# Patient Record
Sex: Female | Born: 1935 | Race: White | Hispanic: No | Smoking: Never smoker
Health system: Southern US, Community
[De-identification: ages and names within clinical notes are randomized; demographics above are authoritative.]

## PROBLEM LIST (undated history)

## (undated) DIAGNOSIS — R55 Syncope and collapse: Secondary | ICD-10-CM

## (undated) DIAGNOSIS — B029 Zoster without complications: Secondary | ICD-10-CM

## (undated) DIAGNOSIS — Z8719 Personal history of other diseases of the digestive system: Secondary | ICD-10-CM

## (undated) DIAGNOSIS — R413 Other amnesia: Secondary | ICD-10-CM

## (undated) DIAGNOSIS — I509 Heart failure, unspecified: Secondary | ICD-10-CM

## (undated) DIAGNOSIS — E039 Hypothyroidism, unspecified: Secondary | ICD-10-CM

## (undated) DIAGNOSIS — I480 Paroxysmal atrial fibrillation: Secondary | ICD-10-CM

## (undated) DIAGNOSIS — S7010XA Contusion of unspecified thigh, initial encounter: Secondary | ICD-10-CM

## (undated) DIAGNOSIS — G250 Essential tremor: Secondary | ICD-10-CM

## (undated) DIAGNOSIS — E785 Hyperlipidemia, unspecified: Secondary | ICD-10-CM

## (undated) DIAGNOSIS — I2510 Atherosclerotic heart disease of native coronary artery without angina pectoris: Secondary | ICD-10-CM

## (undated) DIAGNOSIS — H353190 Nonexudative age-related macular degeneration, unspecified eye, stage unspecified: Secondary | ICD-10-CM

## (undated) DIAGNOSIS — Z9889 Other specified postprocedural states: Secondary | ICD-10-CM

## (undated) DIAGNOSIS — N390 Urinary tract infection, site not specified: Secondary | ICD-10-CM

## (undated) DIAGNOSIS — R42 Dizziness and giddiness: Secondary | ICD-10-CM

## (undated) DIAGNOSIS — K219 Gastro-esophageal reflux disease without esophagitis: Secondary | ICD-10-CM

## (undated) DIAGNOSIS — I442 Atrioventricular block, complete: Secondary | ICD-10-CM

## (undated) DIAGNOSIS — G709 Myoneural disorder, unspecified: Secondary | ICD-10-CM

## (undated) DIAGNOSIS — I10 Essential (primary) hypertension: Secondary | ICD-10-CM

## (undated) DIAGNOSIS — J189 Pneumonia, unspecified organism: Secondary | ICD-10-CM

## (undated) DIAGNOSIS — Z950 Presence of cardiac pacemaker: Secondary | ICD-10-CM

## (undated) DIAGNOSIS — I951 Orthostatic hypotension: Secondary | ICD-10-CM

## (undated) DIAGNOSIS — K859 Acute pancreatitis without necrosis or infection, unspecified: Secondary | ICD-10-CM

## (undated) DIAGNOSIS — Z8679 Personal history of other diseases of the circulatory system: Secondary | ICD-10-CM

## (undated) HISTORY — PX: REPAIR SPIGELIAN HERNIA: SUR1208

## (undated) HISTORY — PX: OTHER SURGICAL HISTORY: SHX169

## (undated) HISTORY — DX: Gastro-esophageal reflux disease without esophagitis: K21.9

## (undated) HISTORY — PX: INSERT / REPLACE / REMOVE PACEMAKER: SUR710

## (undated) HISTORY — PX: HERNIA REPAIR: SHX51

## (undated) HISTORY — PX: ESOPHAGEAL DILATION: SHX303

## (undated) HISTORY — DX: Syncope and collapse: R55

## (undated) HISTORY — DX: Acute pancreatitis without necrosis or infection, unspecified: K85.90

## (undated) HISTORY — DX: Zoster without complications: B02.9

## (undated) HISTORY — DX: Hypothyroidism, unspecified: E03.9

## (undated) HISTORY — DX: Atherosclerotic heart disease of native coronary artery without angina pectoris: I25.10

## (undated) HISTORY — PX: CATARACT EXTRACTION W/ INTRAOCULAR LENS  IMPLANT, BILATERAL: SHX1307

---
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
## 1968-12-01 HISTORY — PX: VAGINAL HYSTERECTOMY: SUR661

## 1968-12-01 HISTORY — PX: LEFT OOPHORECTOMY: SHX1961

## 1978-12-02 DIAGNOSIS — B029 Zoster without complications: Secondary | ICD-10-CM

## 1978-12-02 HISTORY — DX: Zoster without complications: B02.9

## 1997-08-19 ENCOUNTER — Encounter: Admission: RE | Admit: 1997-08-19 | Discharge: 1997-11-17 | Payer: Self-pay | Admitting: Orthopedic Surgery

## 1999-03-14 ENCOUNTER — Encounter: Payer: Self-pay | Admitting: Emergency Medicine

## 1999-03-14 ENCOUNTER — Inpatient Hospital Stay (HOSPITAL_COMMUNITY): Admission: EM | Admit: 1999-03-14 | Discharge: 1999-03-18 | Payer: Self-pay | Admitting: Emergency Medicine

## 1999-03-17 ENCOUNTER — Encounter: Payer: Self-pay | Admitting: Cardiovascular Disease

## 1999-04-13 ENCOUNTER — Encounter: Payer: Self-pay | Admitting: *Deleted

## 1999-04-13 ENCOUNTER — Inpatient Hospital Stay (HOSPITAL_COMMUNITY): Admission: AD | Admit: 1999-04-13 | Discharge: 1999-04-15 | Payer: Self-pay | Admitting: *Deleted

## 2000-09-24 ENCOUNTER — Inpatient Hospital Stay (HOSPITAL_COMMUNITY): Admission: EM | Admit: 2000-09-24 | Discharge: 2000-09-25 | Payer: Self-pay | Admitting: Emergency Medicine

## 2000-09-24 ENCOUNTER — Encounter: Payer: Self-pay | Admitting: Emergency Medicine

## 2000-10-11 ENCOUNTER — Inpatient Hospital Stay (HOSPITAL_COMMUNITY): Admission: EM | Admit: 2000-10-11 | Discharge: 2000-10-17 | Payer: Self-pay | Admitting: Emergency Medicine

## 2000-10-11 ENCOUNTER — Encounter: Payer: Self-pay | Admitting: Emergency Medicine

## 2000-10-13 ENCOUNTER — Encounter: Payer: Self-pay | Admitting: Cardiovascular Disease

## 2000-10-15 ENCOUNTER — Encounter: Payer: Self-pay | Admitting: Cardiovascular Disease

## 2001-01-23 ENCOUNTER — Encounter: Payer: Self-pay | Admitting: Emergency Medicine

## 2001-01-23 ENCOUNTER — Encounter: Payer: Self-pay | Admitting: Cardiology

## 2001-01-23 ENCOUNTER — Inpatient Hospital Stay (HOSPITAL_COMMUNITY): Admission: EM | Admit: 2001-01-23 | Discharge: 2001-01-24 | Payer: Self-pay | Admitting: Emergency Medicine

## 2001-03-19 ENCOUNTER — Ambulatory Visit (HOSPITAL_COMMUNITY): Admission: RE | Admit: 2001-03-19 | Discharge: 2001-03-19 | Payer: Self-pay | Admitting: Gastroenterology

## 2002-06-18 ENCOUNTER — Inpatient Hospital Stay (HOSPITAL_COMMUNITY): Admission: EM | Admit: 2002-06-18 | Discharge: 2002-06-20 | Payer: Self-pay | Admitting: Emergency Medicine

## 2002-06-18 ENCOUNTER — Encounter: Payer: Self-pay | Admitting: Emergency Medicine

## 2002-08-19 ENCOUNTER — Emergency Department (HOSPITAL_COMMUNITY): Admission: EM | Admit: 2002-08-19 | Discharge: 2002-08-19 | Payer: Self-pay

## 2002-11-26 ENCOUNTER — Encounter: Payer: Self-pay | Admitting: Emergency Medicine

## 2002-11-26 ENCOUNTER — Observation Stay (HOSPITAL_COMMUNITY): Admission: EM | Admit: 2002-11-26 | Discharge: 2002-11-27 | Payer: Self-pay | Admitting: Emergency Medicine

## 2003-04-10 ENCOUNTER — Inpatient Hospital Stay (HOSPITAL_COMMUNITY): Admission: EM | Admit: 2003-04-10 | Discharge: 2003-04-10 | Payer: Self-pay | Admitting: Emergency Medicine

## 2003-10-01 ENCOUNTER — Encounter (INDEPENDENT_AMBULATORY_CARE_PROVIDER_SITE_OTHER): Payer: Self-pay | Admitting: Specialist

## 2003-10-01 ENCOUNTER — Inpatient Hospital Stay (HOSPITAL_COMMUNITY): Admission: RE | Admit: 2003-10-01 | Discharge: 2003-10-01 | Payer: Self-pay | Admitting: Plastic Surgery

## 2003-12-24 ENCOUNTER — Inpatient Hospital Stay (HOSPITAL_COMMUNITY): Admission: EM | Admit: 2003-12-24 | Discharge: 2003-12-25 | Payer: Self-pay | Admitting: Emergency Medicine

## 2004-02-01 ENCOUNTER — Ambulatory Visit: Payer: Self-pay | Admitting: Family Medicine

## 2004-02-01 ENCOUNTER — Ambulatory Visit (HOSPITAL_COMMUNITY): Admission: RE | Admit: 2004-02-01 | Discharge: 2004-02-01 | Payer: Self-pay | Admitting: Family Medicine

## 2004-02-17 ENCOUNTER — Ambulatory Visit: Payer: Self-pay | Admitting: Family Medicine

## 2004-04-02 ENCOUNTER — Emergency Department (HOSPITAL_COMMUNITY): Admission: EM | Admit: 2004-04-02 | Discharge: 2004-04-02 | Payer: Self-pay | Admitting: Emergency Medicine

## 2004-04-26 ENCOUNTER — Ambulatory Visit: Payer: Self-pay | Admitting: Family Medicine

## 2004-04-27 ENCOUNTER — Ambulatory Visit: Payer: Self-pay | Admitting: Family Medicine

## 2004-05-02 ENCOUNTER — Ambulatory Visit: Payer: Self-pay | Admitting: Family Medicine

## 2004-05-30 ENCOUNTER — Ambulatory Visit: Payer: Self-pay | Admitting: Family Medicine

## 2004-06-27 ENCOUNTER — Ambulatory Visit: Payer: Self-pay | Admitting: Family Medicine

## 2004-09-09 ENCOUNTER — Emergency Department (HOSPITAL_COMMUNITY): Admission: EM | Admit: 2004-09-09 | Discharge: 2004-09-09 | Payer: Self-pay | Admitting: Emergency Medicine

## 2005-01-05 ENCOUNTER — Ambulatory Visit: Payer: Self-pay | Admitting: Family Medicine

## 2005-01-17 ENCOUNTER — Ambulatory Visit: Payer: Self-pay | Admitting: Family Medicine

## 2005-12-07 ENCOUNTER — Ambulatory Visit: Payer: Self-pay | Admitting: Family Medicine

## 2005-12-23 ENCOUNTER — Inpatient Hospital Stay (HOSPITAL_COMMUNITY): Admission: EM | Admit: 2005-12-23 | Discharge: 2005-12-27 | Payer: Self-pay | Admitting: Emergency Medicine

## 2005-12-24 HISTORY — PX: CARDIAC CATHETERIZATION: SHX172

## 2005-12-28 ENCOUNTER — Inpatient Hospital Stay (HOSPITAL_COMMUNITY): Admission: EM | Admit: 2005-12-28 | Discharge: 2005-12-31 | Payer: Self-pay | Admitting: Emergency Medicine

## 2006-01-02 ENCOUNTER — Ambulatory Visit: Payer: Self-pay | Admitting: Internal Medicine

## 2006-01-15 ENCOUNTER — Ambulatory Visit: Payer: Self-pay | Admitting: Family Medicine

## 2006-02-06 ENCOUNTER — Ambulatory Visit: Payer: Self-pay | Admitting: Family Medicine

## 2006-05-23 ENCOUNTER — Ambulatory Visit: Payer: Self-pay | Admitting: Family Medicine

## 2006-10-11 ENCOUNTER — Inpatient Hospital Stay (HOSPITAL_COMMUNITY): Admission: EM | Admit: 2006-10-11 | Discharge: 2006-10-15 | Payer: Self-pay | Admitting: Emergency Medicine

## 2006-10-13 HISTORY — PX: CARDIAC CATHETERIZATION: SHX172

## 2006-10-15 ENCOUNTER — Telehealth: Payer: Self-pay | Admitting: Family Medicine

## 2006-10-15 ENCOUNTER — Telehealth (INDEPENDENT_AMBULATORY_CARE_PROVIDER_SITE_OTHER): Payer: Self-pay | Admitting: *Deleted

## 2006-11-09 DIAGNOSIS — R110 Nausea: Secondary | ICD-10-CM

## 2006-11-12 ENCOUNTER — Ambulatory Visit: Payer: Self-pay | Admitting: Family Medicine

## 2006-11-12 DIAGNOSIS — Z8719 Personal history of other diseases of the digestive system: Secondary | ICD-10-CM | POA: Insufficient documentation

## 2006-11-12 DIAGNOSIS — I10 Essential (primary) hypertension: Secondary | ICD-10-CM

## 2006-11-12 DIAGNOSIS — K219 Gastro-esophageal reflux disease without esophagitis: Secondary | ICD-10-CM

## 2006-11-12 DIAGNOSIS — E039 Hypothyroidism, unspecified: Secondary | ICD-10-CM

## 2006-11-14 ENCOUNTER — Inpatient Hospital Stay (HOSPITAL_COMMUNITY): Admission: EM | Admit: 2006-11-14 | Discharge: 2006-11-15 | Payer: Self-pay | Admitting: Emergency Medicine

## 2006-11-14 HISTORY — PX: CARDIAC CATHETERIZATION: SHX172

## 2006-11-15 ENCOUNTER — Inpatient Hospital Stay (HOSPITAL_COMMUNITY): Admission: EM | Admit: 2006-11-15 | Discharge: 2006-11-20 | Payer: Self-pay | Admitting: Emergency Medicine

## 2006-11-15 ENCOUNTER — Ambulatory Visit: Payer: Self-pay | Admitting: Internal Medicine

## 2006-11-18 ENCOUNTER — Ambulatory Visit: Payer: Self-pay | Admitting: Vascular Surgery

## 2006-11-28 ENCOUNTER — Ambulatory Visit: Payer: Self-pay | Admitting: Family Medicine

## 2006-11-28 DIAGNOSIS — I2510 Atherosclerotic heart disease of native coronary artery without angina pectoris: Secondary | ICD-10-CM

## 2006-11-28 DIAGNOSIS — R55 Syncope and collapse: Secondary | ICD-10-CM

## 2006-12-07 ENCOUNTER — Emergency Department (HOSPITAL_COMMUNITY): Admission: EM | Admit: 2006-12-07 | Discharge: 2006-12-07 | Payer: Self-pay | Admitting: Emergency Medicine

## 2006-12-09 ENCOUNTER — Ambulatory Visit (HOSPITAL_COMMUNITY): Admission: RE | Admit: 2006-12-09 | Discharge: 2006-12-09 | Payer: Self-pay | Admitting: Cardiovascular Disease

## 2007-02-01 ENCOUNTER — Emergency Department (HOSPITAL_COMMUNITY): Admission: EM | Admit: 2007-02-01 | Discharge: 2007-02-01 | Payer: Self-pay | Admitting: Emergency Medicine

## 2007-02-06 ENCOUNTER — Ambulatory Visit: Payer: Self-pay | Admitting: Family Medicine

## 2007-04-25 ENCOUNTER — Ambulatory Visit: Payer: Self-pay | Admitting: Family Medicine

## 2007-04-25 DIAGNOSIS — L259 Unspecified contact dermatitis, unspecified cause: Secondary | ICD-10-CM

## 2007-06-11 ENCOUNTER — Encounter: Payer: Self-pay | Admitting: Family Medicine

## 2007-07-21 ENCOUNTER — Telehealth: Payer: Self-pay | Admitting: Family Medicine

## 2007-07-23 ENCOUNTER — Inpatient Hospital Stay (HOSPITAL_COMMUNITY): Admission: EM | Admit: 2007-07-23 | Discharge: 2007-07-25 | Payer: Self-pay | Admitting: Emergency Medicine

## 2007-07-24 HISTORY — PX: CARDIAC CATHETERIZATION: SHX172

## 2007-07-29 ENCOUNTER — Ambulatory Visit: Payer: Self-pay | Admitting: Family Medicine

## 2007-07-29 DIAGNOSIS — F329 Major depressive disorder, single episode, unspecified: Secondary | ICD-10-CM

## 2007-07-29 DIAGNOSIS — F0390 Unspecified dementia without behavioral disturbance: Secondary | ICD-10-CM

## 2008-01-14 ENCOUNTER — Encounter: Payer: Self-pay | Admitting: Family Medicine

## 2008-01-19 ENCOUNTER — Encounter: Payer: Self-pay | Admitting: Family Medicine

## 2008-01-23 ENCOUNTER — Encounter: Admission: RE | Admit: 2008-01-23 | Discharge: 2008-01-23 | Payer: Self-pay | Admitting: Cardiovascular Disease

## 2008-01-28 ENCOUNTER — Inpatient Hospital Stay (HOSPITAL_COMMUNITY): Admission: RE | Admit: 2008-01-28 | Discharge: 2008-01-29 | Payer: Self-pay | Admitting: Cardiology

## 2008-03-12 ENCOUNTER — Inpatient Hospital Stay (HOSPITAL_COMMUNITY): Admission: EM | Admit: 2008-03-12 | Discharge: 2008-03-13 | Payer: Self-pay | Admitting: Emergency Medicine

## 2009-03-18 ENCOUNTER — Encounter (INDEPENDENT_AMBULATORY_CARE_PROVIDER_SITE_OTHER): Payer: Self-pay | Admitting: *Deleted

## 2009-04-04 LAB — HM MAMMOGRAPHY

## 2009-04-11 ENCOUNTER — Encounter: Payer: Self-pay | Admitting: Family Medicine

## 2009-10-10 ENCOUNTER — Ambulatory Visit: Payer: Self-pay | Admitting: Family Medicine

## 2009-10-11 ENCOUNTER — Telehealth: Payer: Self-pay | Admitting: Family Medicine

## 2009-10-12 ENCOUNTER — Encounter: Payer: Self-pay | Admitting: Family Medicine

## 2009-10-12 LAB — CONVERTED CEMR LAB
ALT: 30 units/L (ref 0–35)
AST: 35 units/L (ref 0–37)
Alkaline Phosphatase: 231 units/L — ABNORMAL HIGH (ref 39–117)
BUN: 18 mg/dL (ref 6–23)
Eosinophils Relative: 4.5 % (ref 0.0–5.0)
GFR calc non Af Amer: 81.38 mL/min (ref >60)
HCT: 37.5 % (ref 36.0–46.0)
Hemoglobin: 12.7 g/dL (ref 12.0–15.0)
LDL Cholesterol: 71 mg/dL (ref 0–99)
Lymphocytes Relative: 22.2 % (ref 12.0–46.0)
Lymphs Abs: 1.6 10*3/uL (ref 0.7–4.0)
Monocytes Relative: 11.7 % (ref 3.0–12.0)
Platelets: 175 10*3/uL (ref 150.0–400.0)
Potassium: 4.9 meq/L (ref 3.5–5.1)
Sodium: 142 meq/L (ref 135–145)
TSH: 1.83 microintl units/mL (ref 0.35–5.50)
Total Bilirubin: 0.9 mg/dL (ref 0.3–1.2)
Total CHOL/HDL Ratio: 3
VLDL: 24.4 mg/dL (ref 0.0–40.0)
WBC: 7.4 10*3/uL (ref 4.5–10.5)

## 2010-01-13 ENCOUNTER — Inpatient Hospital Stay (HOSPITAL_COMMUNITY): Admission: EM | Admit: 2010-01-13 | Discharge: 2010-01-17 | Payer: Self-pay | Admitting: Emergency Medicine

## 2010-01-16 HISTORY — PX: CARDIAC CATHETERIZATION: SHX172

## 2010-04-21 ENCOUNTER — Inpatient Hospital Stay (HOSPITAL_COMMUNITY)
Admission: EM | Admit: 2010-04-21 | Discharge: 2010-04-23 | Payer: Self-pay | Source: Home / Self Care | Attending: Cardiology | Admitting: Cardiology

## 2010-04-24 LAB — CBC
HCT: 36.2 % (ref 36.0–46.0)
Hemoglobin: 11.6 g/dL — ABNORMAL LOW (ref 12.0–15.0)
MCH: 28 pg (ref 26.0–34.0)
MCHC: 32 g/dL (ref 30.0–36.0)
MCV: 87.2 fL (ref 78.0–100.0)
RDW: 15.9 % — ABNORMAL HIGH (ref 11.5–15.5)

## 2010-04-24 LAB — DIFFERENTIAL
Basophils Absolute: 0 10*3/uL (ref 0.0–0.1)
Eosinophils Relative: 1 % (ref 0–5)
Lymphocytes Relative: 16 % (ref 12–46)
Monocytes Absolute: 1 10*3/uL (ref 0.1–1.0)
Monocytes Relative: 13 % — ABNORMAL HIGH (ref 3–12)
Neutro Abs: 5.2 10*3/uL (ref 1.7–7.7)

## 2010-04-24 LAB — CARDIAC PANEL(CRET KIN+CKTOT+MB+TROPI)
CK, MB: 3.7 ng/mL (ref 0.3–4.0)
Total CK: 110 U/L (ref 7–177)
Troponin I: 0.02 ng/mL (ref 0.00–0.06)

## 2010-04-24 LAB — POCT I-STAT, CHEM 8
Glucose, Bld: 113 mg/dL — ABNORMAL HIGH (ref 70–99)
HCT: 42 % (ref 36.0–46.0)
Hemoglobin: 14.3 g/dL (ref 12.0–15.0)
Potassium: 3 mEq/L — ABNORMAL LOW (ref 3.5–5.1)
Sodium: 142 mEq/L (ref 135–145)
TCO2: 29 mmol/L (ref 0–100)

## 2010-04-24 LAB — BRAIN NATRIURETIC PEPTIDE: Pro B Natriuretic peptide (BNP): 447 pg/mL — ABNORMAL HIGH (ref 0.0–100.0)

## 2010-04-24 LAB — POCT CARDIAC MARKERS: CKMB, poc: 4.6 ng/mL (ref 1.0–8.0)

## 2010-04-24 LAB — PROTIME-INR
INR: 0.97 (ref 0.00–1.49)
Prothrombin Time: 13.1 seconds (ref 11.6–15.2)

## 2010-04-24 LAB — HEPARIN LEVEL (UNFRACTIONATED): Heparin Unfractionated: 0.31 IU/mL (ref 0.30–0.70)

## 2010-04-24 LAB — CK TOTAL AND CKMB (NOT AT ARMC): CK, MB: 5.2 ng/mL — ABNORMAL HIGH (ref 0.3–4.0)

## 2010-04-25 LAB — LIPID PANEL
LDL Cholesterol: 69 mg/dL (ref 0–99)
VLDL: 13 mg/dL (ref 0–40)

## 2010-04-25 LAB — HEPARIN LEVEL (UNFRACTIONATED)
Heparin Unfractionated: 0.22 IU/mL — ABNORMAL LOW (ref 0.30–0.70)
Heparin Unfractionated: 0.49 IU/mL (ref 0.30–0.70)

## 2010-04-25 LAB — CARDIAC PANEL(CRET KIN+CKTOT+MB+TROPI)
CK, MB: 4 ng/mL (ref 0.3–4.0)
Relative Index: 3.1 — ABNORMAL HIGH (ref 0.0–2.5)
Troponin I: 0.04 ng/mL (ref 0.00–0.06)

## 2010-04-25 LAB — CBC
HCT: 35.9 % — ABNORMAL LOW (ref 36.0–46.0)
Hemoglobin: 11.4 g/dL — ABNORMAL LOW (ref 12.0–15.0)
MCH: 28.1 pg (ref 26.0–34.0)
MCHC: 31.8 g/dL (ref 30.0–36.0)
MCV: 88.4 fL (ref 78.0–100.0)
Platelets: 194 10*3/uL (ref 150–400)
RBC: 4.06 MIL/uL (ref 3.87–5.11)
RBC: 4.77 MIL/uL (ref 3.87–5.11)
WBC: 6.5 10*3/uL (ref 4.0–10.5)

## 2010-04-25 LAB — DIFFERENTIAL
Basophils Absolute: 0 10*3/uL (ref 0.0–0.1)
Basophils Relative: 0 % (ref 0–1)
Eosinophils Absolute: 0.4 10*3/uL (ref 0.0–0.7)
Monocytes Absolute: 0.9 10*3/uL (ref 0.1–1.0)
Monocytes Relative: 14 % — ABNORMAL HIGH (ref 3–12)

## 2010-04-25 LAB — COMPREHENSIVE METABOLIC PANEL
AST: 29 U/L (ref 0–37)
Albumin: 3.7 g/dL (ref 3.5–5.2)
Alkaline Phosphatase: 142 U/L — ABNORMAL HIGH (ref 39–117)
BUN: 11 mg/dL (ref 6–23)
CO2: 31 mEq/L (ref 19–32)
Chloride: 101 mEq/L (ref 96–112)
Creatinine, Ser: 0.94 mg/dL (ref 0.4–1.2)
GFR calc non Af Amer: 56 mL/min — ABNORMAL LOW (ref >60)
Potassium: 4.1 mEq/L (ref 3.5–5.1)
Total Bilirubin: 1.2 mg/dL (ref 0.3–1.2)

## 2010-04-25 LAB — BASIC METABOLIC PANEL
Chloride: 96 mEq/L (ref 96–112)
GFR calc Af Amer: >60 mL/min (ref >60)
Potassium: 3.3 mEq/L — ABNORMAL LOW (ref 3.5–5.1)

## 2010-04-25 LAB — BRAIN NATRIURETIC PEPTIDE
Pro B Natriuretic peptide (BNP): 323 pg/mL — ABNORMAL HIGH (ref 0.0–100.0)
Pro B Natriuretic peptide (BNP): 255 pg/mL — ABNORMAL HIGH (ref 0.0–100.0)

## 2010-04-27 NOTE — Discharge Summary (Addendum)
NAMEMarland Kitchen  [name] Luna, [name] Luna NO.:  [id number]  MEDICAL RECORD NO.:  [id number]          PATIENT TYPE:  INP  LOCATION:  2016                         FACILITY:  [facility]  PHYSICIAN:  Landry Corporal, MD DATE OF BIRTH:  1920/07/07  DATE OF ADMISSION:  04/21/2010 DATE OF DISCHARGE:  04/23/2010                              DISCHARGE SUMMARY   DISCHARGE DIAGNOSES: 1. Acute diastolic heart failure exacerbation. 2. Paroxysmal atrial fibrillation. 3. Coronary artery disease, status post percutaneous coronary     intervention, on Plavix. 4. Status post permanent pacemaker secondary to complete heart block. 5. Hypertension, treated, 6. Hypokalemia, repleted. 7. Chest pain- unstable angina.  HOSPITAL COURSE:  [name] Luna is an 77 year old Caucasian female with a history of permanent pacemaker implant which is [employer] secondary to complete heart block.  She also has a history of coronary artery disease, status post PCI and balloon cutting atherectomy to the LAD in October 2011 as well as 2008.  History also includes paroxysmal atrial fibrillation, hypertension, hypotension, complete heart block, and ejection fraction of 65% by cardiac cath.  Last carotid Dopplers were on January 09, 2008, with 0 to 49% right and left reduction.  She presented with chest pain, 8/10 and heavy, into the left breast.  She was admitted for rule out acute coronary syndrome.  Cardiac enzymes were negative x3. BNP was elevated at 447.  The patient was started on IV Lasix, subsequently diuresed and corresponding reduction in BNP.  At this time, the patient is stable and seen by Dr. Theodis Aguas, because she is ready to go home.  She will follow up Dr. Alanda Amass.  I will increase her Lasix to 40 mg twice a day.  DISCHARGE LABS:  WBC 6.5, hemoglobin 13.4, hematocrit 41.6, platelets 194.  Sodium 137, potassium 3.3, chloride 96, carbon dioxide 30, glucose 96, BUN 11, creatinine 0.87, calcium 9.4, total  cholesterol 129, triglycerides 64, HDL 47, LDL 69.  Thyroid stimulating hormone 3.556.  STUDIES/PROCEDURES:  Chest x-ray.  IMPRESSION:  Stable cardiomegaly.  Pulmonary venous hypertension without overt edema.  Bilateral pleural effusion with associated atelectasis versus pneumonia in the lower lobes.  This was completed on January 20.  DISCHARGE MEDICATIONS: 1. Furosemide 40 mg 1 tablet by mouth twice daily. 2. Potassium chloride 20 mEq 1 tablet by mouth daily. 3. Valsartan 80 mg 1 tablet by mouth twice daily. 4. Aspirin 81 mg 2 tablets by mouth every morning. 5. Calcium carbonate 750 mg 1 tablet by mouth twice daily. 6. Claritin 10 mg 1 tablet by mouth daily. 7. Plavix 75 mg 1 tablet by mouth daily with meal. 8. Hydrochlorothiazide 12.5 mg 1 capsule by mouth every morning. 9. Levothyroxine 75 mcg 1 tablet by mouth every morning. 10.Multivitamins 1 tablet by mouth every morning. 11.Metoprolol 100 mg 1-1/2 tablet by mouth twice daily. 12.Nitroglycerin 0.4 mg tablet sublingual 1 tablet take every 5     minutes for chest pain, maximum of 3 doses. 13.Ocuvite 1 tablet by mouth twice day. 14.Pantoprazole 40 mg enteric coated 2 tablets by mouth daily. 15.Polyethylene glycol 17 g by mouth every other day mixed in water. 16.Simvastatin 20 mg 1  tablet by mouth daily at bedtime. 17.Verapamil SR 120 mg 1 tablet by mouth daily.  FINAL DISPOSITION:  [name] Luna will be discharged home in stable condition, increase her activity slowly, eat low-sodium heart-healthy diet.  Follow up Dr. Alanda Amass this week and our office will call with the appointment.    ______________________________ Wilburt Finlay, PA   The patient was seen by one of my partners, Dr. Rennis Golden, on the day of discharge and was felt to be stable for discharge.  After reviewing her chart, I agree with Mr. Jasper Riling summary of the hospitalization.   ______________________________ Landry Corporal, MD    BH/MEDQ  D:  04/23/2010  T:   04/24/2010  Job:  [id number]  cc:   Gerlene Burdock A. Alanda Amass, M.D.  Electronically Signed by Wilburt Finlay PA on 04/25/2010 12:28:52 PM Electronically Signed by Bryan Lemma MD on 04/27/2010 11:31:38 PM

## 2010-05-04 NOTE — Miscellaneous (Signed)
Summary: mammogram update  Clinical Lists Changes  Observations: Added new observation of MAMMO DUE: 04/2010 (04/11/2009 14:09) Added new observation of MAMMOGRAM: normal (03/30/2009 14:09)      Preventive Care Screening  Mammogram:    Date:  03/30/2009    Next Due:  04/2010    Results:  normal

## 2010-05-04 NOTE — Progress Notes (Signed)
Summary: metoprolol question  Phone Note From Pharmacy   Caller: [facility]  [address] [phone]* Summary of Call: meotrolol succinate was sent to the pharmacy.  would you like that changed to succinate ER? Initial call taken by: Kern Reap CMA Duncan Dull),  October 11, 2009 5:06 PM  Follow-up for Phone Call        ok Follow-up by: Roderick Pee MD,  October 12, 2009 9:21 AM    New/Updated Medications: METOPROLOL SUCCINATE 100 MG XR24H-TAB (METOPROLOL SUCCINATE) take one tab by mouth two times a day Prescriptions: METOPROLOL SUCCINATE 100 MG XR24H-TAB (METOPROLOL SUCCINATE) take one tab by mouth two times a day  #200 x 3   Entered by:   Kern Reap CMA (AAMA)   Authorized by:   Roderick Pee MD   Signed by:   Kern Reap CMA (AAMA) on 10/12/2009   Method used:   Electronically to        [facility] [phone]* (retail)       [address]       [place], [place]  [address]       Ph: [phone]       Fax: [phone]   RxID:   [id number]

## 2010-05-04 NOTE — Assessment & Plan Note (Signed)
Summary: CPX (PT WILL COME IN FASTING) // RS   Vital Signs:  Patient profile:   77 year old female Menstrual status:  hysterectomy Height:      64 inches Weight:      155 pounds BMI:     26.70 Temp:     97.8 degrees F oral BP sitting:   160 / 84  (left arm) Cuff size:   regular  Vitals Entered By: Kern Reap CMA Duncan Dull) (October 10, 2009 10:24 AM) CC: cpx Is Patient Diabetic? No Pain Assessment Patient in pain? no          Menstrual Status hysterectomy   CC:  cpx.  History of Present Illness: [name] Luna is an 77 year old recently widowed female nonsmoker, who comes in today for general physical examination because of the following problems.  She has reflux esophagitis treated with Nexium 40 mg daily asymptomatic on medication.  Physical history of hypertension.  She is on verapamil 240 mg daily, metoprolol 100 mg b.i.d., hydrocortisone is a 12.5 mg daily, Diovan 80 mg daily.  BP 160/84.  She also takes simvastatin 20 mg nightly for hyperlipidemia.  She takes Plavix 75 mg 3 times a week because of a history of coronary disease and arrhythmia.  She recently had a defibrillator placed last fall, functioning well.  No events.  Shows a takes Synthroid 75 micrograms daily for hypothyroidism.  She takes calcium, vitamin D, and a baby aspirin daily.  Also, Claritin, 10 mg plain q.a.m. for allergic rhinitis.  She lives at home is by herself during the day.  Her son stays with her at night.  They have an emergency electronic device in case she needs assistance during the day.  She is able to perform her ADLs Here for Medicare AWV:  1.   Risk factors based on Past M, S, F history:.Marland KitchenMarland KitchenMarland Kitchensee above.  Implantable defibrillator 2.   Physical Activities: .[name]...walks daily 3.   Depression/mood: ......good no depression 4.   Hearing: .[name]...decreased recommend audiogram 5.   ADL's: .[name]..okay by self 6.   Fall Risk: ...Marland KitchenMarland KitchenMarland Kitchenreviewed 7.   Home Safety.......[name] review:  8.   Height, weight,  &visual acuity:......stable annual eye exam 9.   Counseling: .......audiogram possible hearing aids 10.   Labs ordered based on risk factors:.......done today  11.           Referral Coordination....none 13.            Cognitive Assessment ...........son has power-of-attorney, and she has made out.  A living will  Allergies: 1)  ! Pcn 2)  ! Prednisone 3)  ! Codeine 4)  ! Ativan 5)  ! Imdur (Isosorbide Mononitrate)  Past History:  Past medical, surgical, family and social histories (including risk factors) reviewed, and no changes noted (except as noted below).  Past Medical History: Reviewed history from 04/25/2007 and no changes required. GERD Hypertension Hypothyroidism Bradycardia Pancreatitis, hx of Post Menopausal Coronary artery disease shingles  Past Surgical History: Permanent pacemaker S/P Total Hysterectomy and Left oophorectomy S/P Cholecystectomy S/P Hernia Repair, right x2 S/P Child birth x2 S/P Cataract extraction with lens implant S/P Appendectomy stint 2008  Family History: Reviewed history and no changes required.  Social History: Reviewed history from 11/28/2006 and no changes required. Retired Married Never Smoked she is in declining mental and physical health.  Her husband has severe dementia.  The son is the primary caregiver.  He sees them at least once a day.  They want to stay in their home for as  long as possible  Review of Systems      See HPI  Physical Exam  General:  Well-developed,well-nourished,in no acute distress; alert,appropriate and cooperative throughout examination Head:  Normocephalic and atraumatic without obvious abnormalities. No apparent alopecia or balding. Eyes:  No corneal or conjunctival inflammation noted. EOMI. Perrla. Funduscopic exam benign, without hemorrhages, exudates or papilledema. Vision grossly normal. Ears:  External ear exam shows no significant lesions or deformities.  Otoscopic examination reveals  clear canals, tympanic membranes are intact bilaterally without bulging, retraction, inflammation or discharge. Hearing is grossly normal bilaterally. Nose:  External nasal examination shows no deformity or inflammation. Nasal mucosa are pink and moist without lesions or exudates. Mouth:  Oral mucosa and oropharynx without lesions or exudates.  Teeth in good repair. Neck:  No deformities, masses, or tenderness noted. Chest Wall:  No deformities, masses, or tenderness noted. Breasts:  No mass, nodules, thickening, tenderness, bulging, retraction, inflamation, nipple discharge or skin changes noted.   Lungs:  Normal respiratory effort, chest expands symmetrically. Lungs are clear to auscultation, no crackles or wheezes. Heart:  Normal rate and regular rhythm. S1 and S2 normal without gallop, murmur, click, rub or other extra sounds. Abdomen:  Bowel sounds positive,abdomen soft and non-tender without masses, organomegaly or hernias noted. Msk:  No deformity or scoliosis noted of thoracic or lumbar spine.   Pulses:  R and L carotid,radial,femoral,dorsalis pedis and posterior tibial pulses are full and equal bilaterally Extremities:  No clubbing, cyanosis, edema, or deformity noted with normal full range of motion of all joints.   Neurologic:  No cranial nerve deficits noted. Station and gait are normal. Plantar reflexes are down-going bilaterally. DTRs are symmetrical throughout. Sensory, motor and coordinative functions appear intact. Skin:  Intact without suspicious lesions or rashes Cervical Nodes:  No lymphadenopathy noted Axillary Nodes:  No palpable lymphadenopathy Inguinal Nodes:  No significant adenopathy Psych:  Cognition and judgment appear intact. Alert and cooperative with normal attention span and concentration. 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 1qd Her updated medication list for this problem includes:    Synthroid 75 Mcg Tabs (Levothyroxine sodium) .[name]... Take one tab by mouth once daily  Orders: Venipuncture (32355) TLB-BMP (Basic Metabolic Panel-BMET) (80048-METABOL) TLB-CBC Platelet - w/Differential (85025-CBCD) TLB-Hepatic/Liver Function Pnl (80076-HEPATIC) TLB-TSH (Thyroid Stimulating Hormone) (84443-TSH) TLB-Lipid Panel (80061-LIPID) Prescription Created Electronically [phone]) First annual wellness visit with prevention plan  (U5427)  Problem # 3:  HYPERTENSION (ICD-401.9) Assessment: Improved  Her updated medication list for this problem includes:    Verapamil Hcl Cr 240 Mg Cp24 (Verapamil hcl) .[name]... 1qd    Metoprolol Succinate 100 Mg Tb24 (Metoprolol succinate) .[name]... 1 two times a day    Hydrochlorothiazide 12.5 Mg Caps (Hydrochlorothiazide) .[name]... Take one tab by mouth once daily    Diovan 80 Mg Tabs (Valsartan) .[name]... Take one tab by mouth once daily  Orders: Venipuncture (06237) TLB-BMP (Basic Metabolic Panel-BMET) (80048-METABOL) TLB-CBC Platelet - w/Differential (85025-CBCD) TLB-Hepatic/Liver Function Pnl (80076-HEPATIC) TLB-TSH (Thyroid Stimulating Hormone) (84443-TSH) TLB-Lipid Panel (80061-LIPID)  Prescription Created Electronically [phone]) First annual wellness visit with prevention plan  (J8119)  Problem # 4:  GERD (ICD-530.81) Assessment:  Improved  Her updated medication list for this problem includes:    Tums 500 Mg Chew (Calcium carbonate antacid) .[name]..[name] Two times a day    Nexium 40 Mg Cpdr (Esomeprazole magnesium) .[name]... 1 qd  Orders: Venipuncture (14782) TLB-BMP (Basic Metabolic Panel-BMET) (80048-METABOL) TLB-CBC Platelet - w/Differential (85025-CBCD) TLB-Hepatic/Liver Function Pnl (80076-HEPATIC) TLB-TSH (Thyroid Stimulating Hormone) (84443-TSH) TLB-Lipid Panel (80061-LIPID) Prescription Created Electronically [id number]) First annual wellness visit with prevention plan  (H0865)  Problem # 5:  ROUTINE GENERAL MEDICAL EXAM@HEALTH  CARE FACL (ICD-V70.0) Assessment: Unchanged  Orders: Venipuncture (78469) TLB-BMP (Basic Metabolic Panel-BMET) (80048-METABOL) TLB-CBC Platelet - w/Differential (85025-CBCD) TLB-Hepatic/Liver Function Pnl (80076-HEPATIC) TLB-TSH (Thyroid Stimulating Hormone) (84443-TSH) TLB-Lipid Panel (80061-LIPID) Prescription Created Electronically [phone]) First annual wellness visit with prevention plan  (W4132)  Complete Medication List: 1)  Tums 500 Mg Chew (Calcium carbonate antacid) .... Two times a day 2)  Multivitamins Caps (Multiple vitamin) .... Once daily 3)  Adult Aspirin Ec Low Strength 81 Mg Tbec (Aspirin) .... Once daily 4)  Nexium 40 Mg Cpdr (Esomeprazole magnesium) .[name].. 1 qd 5)  Verapamil Hcl Cr 240 Mg Cp24 (Verapamil hcl) .[name].. 1qd 6)  Metoprolol Succinate 100 Mg Tb24 (Metoprolol succinate) .[name].. 1 two times a day 7)  Plavix 75 Mg Tabs (Clopidogrel bisulfate) .[name].. 1 once daily 8)  Preservision/lutein Caps (Multiple vitamins-minerals) .... Two times a day 9)  Synthroid 75 Mcg Tabs (Levothyroxine sodium) .... Take one tab by mouth once daily 10)  Hydrochlorothiazide 12.5 Mg Caps (Hydrochlorothiazide) .... Take one tab by mouth once daily 11)  Diovan 80 Mg Tabs (Valsartan) .... Take one tab by mouth once daily 12)  Zocor 20 Mg Tabs (Simvastatin) .... Take one tab by mouth once daily 13)   Claritin 10 Mg Tabs (Loratadine) .... As needed  Other Orders: TD Toxoids IM 7 YR + (44010) Admin 1st Vaccine (27253)  Patient Instructions: 1)  consider calling in [place], ENT, and seeing terry... the audiologist 2)  Please schedule a follow-up appointment in 1 year. Prescriptions: ZOCOR 20 MG TABS (SIMVASTATIN) take one tab by mouth once daily  #100 x 3   Entered and Authorized by:   Roderick Pee MD   Signed by:   Roderick Pee MD on 10/10/2009   Method used:   Electronically to        [facility] [phone]* (retail)       [address]       [place], [place]  [address]       Ph: [phone]       Fax: [phone]   RxID:   [id number] DIOVAN 80 MG TABS (VALSARTAN) take one tab by mouth once daily  #100 x 3   Entered and Authorized by:   Roderick Pee MD   Signed by:   Roderick Pee MD on 10/10/2009   Method used:   Electronically to        [facility] [phone]* (retail)       [address]       [place], [place]  [address]       Ph: [phone]       Fax: [phone]   RxID:   [id number] HYDROCHLOROTHIAZIDE 12.5 MG CAPS (HYDROCHLOROTHIAZIDE) take one tab by mouth once daily  #100 x  3   Entered and Authorized by:   Roderick Pee MD   Signed by:   Roderick Pee MD on 10/10/2009   Method used:   Electronically to        [facility] [phone]* (retail)       [address]       [place], [place]  [address]       Ph: [phone]       Fax: [phone]   RxID:   [id number] SYNTHROID 75 MCG TABS (LEVOTHYROXINE SODIUM) take one tab by mouth once daily  #100 x 3   Entered and Authorized by:   Roderick Pee MD   Signed by:   Roderick Pee MD on 10/10/2009   Method used:   Electronically to        [facility] [phone]* (retail)       [address]       [place], [place]  [address]       Ph: [phone]       Fax: [phone]   RxID:    [id number] PLAVIX 75 MG  TABS (CLOPIDOGREL BISULFATE) 1 once daily  #50 x 3   Entered and Authorized by:   Roderick Pee MD   Signed by:   Roderick Pee MD on 10/10/2009   Method used:   Electronically to        [facility] [phone]* (retail)       [address]       [place], [place]  [address]       Ph: [phone]       Fax: [phone]   RxID:   [id number] METOPROLOL SUCCINATE 100 MG  TB24 (METOPROLOL SUCCINATE) 1 two times a day  #200 x 3   Entered and Authorized by:   Roderick Pee MD   Signed by:   Roderick Pee MD on 10/10/2009   Method used:   Electronically to        [facility] [phone]* (retail)       [address]       [place], [place]  [address]       Ph: [phone]       Fax: [phone]   RxID:   [id number] VERAPAMIL HCL CR 240 MG  CP24 (VERAPAMIL HCL) 1qd  #100 x 3   Entered and Authorized by:   Roderick Pee MD   Signed by:   Roderick Pee MD on 10/10/2009   Method used:   Electronically to        [facility] [phone]* (retail)       [address]       [place], [place]  [address]       Ph: [phone]       Fax: [phone]   RxID:   [id number] NEXIUM 40 MG  CPDR (ESOMEPRAZOLE MAGNESIUM) 1 qd  #100 x 3   Entered and Authorized by:   Roderick Pee MD   Signed by:   Roderick Pee MD on 10/10/2009   Method used:   Electronically to        [facility]  [phone]* (retail)       [address]  [place], [place]  [address]       Ph: [phone]       Fax: [phone]   RxID:   [id number]    Immunizations Administered:  Tetanus Vaccine:    Vaccine Type: Td    Site: left deltoid    Mfr: Sanofi Pasteur    Dose: 0.5 ml    Route: IM    Given by: Kern Reap CMA (AAMA)    Exp. Date: 04/28/2011    Lot #: [id number]    Physician counseled: yes

## 2010-05-04 NOTE — Miscellaneous (Signed)
Summary: rx update  Clinical Lists Changes  Medications: Removed medication of METOPROLOL SUCCINATE 100 MG XR24H-TAB (METOPROLOL SUCCINATE) take one tab by mouth two times a day Added new medication of METOPROLOL TARTRATE 100 MG TABS (METOPROLOL TARTRATE) take one tab by mouth two times a day

## 2010-05-11 ENCOUNTER — Inpatient Hospital Stay (HOSPITAL_COMMUNITY)
Admission: EM | Admit: 2010-05-11 | Discharge: 2010-05-24 | DRG: 308 | Disposition: A | Discharge status: Skilled Nursing Facility | Payer: Medicare Other | Attending: Cardiovascular Disease | Admitting: Cardiovascular Disease

## 2010-05-11 ENCOUNTER — Emergency Department (HOSPITAL_COMMUNITY): Payer: Medicare Other

## 2010-05-11 DIAGNOSIS — R791 Abnormal coagulation profile: Secondary | ICD-10-CM | POA: Diagnosis present

## 2010-05-11 DIAGNOSIS — Z950 Presence of cardiac pacemaker: Secondary | ICD-10-CM

## 2010-05-11 DIAGNOSIS — I509 Heart failure, unspecified: Secondary | ICD-10-CM | POA: Diagnosis present

## 2010-05-11 DIAGNOSIS — I10 Essential (primary) hypertension: Secondary | ICD-10-CM | POA: Diagnosis present

## 2010-05-11 DIAGNOSIS — N179 Acute kidney failure, unspecified: Secondary | ICD-10-CM | POA: Diagnosis present

## 2010-05-11 DIAGNOSIS — R4182 Altered mental status, unspecified: Secondary | ICD-10-CM | POA: Diagnosis present

## 2010-05-11 DIAGNOSIS — J90 Pleural effusion, not elsewhere classified: Secondary | ICD-10-CM | POA: Diagnosis present

## 2010-05-11 DIAGNOSIS — I5033 Acute on chronic diastolic (congestive) heart failure: Secondary | ICD-10-CM | POA: Diagnosis present

## 2010-05-11 DIAGNOSIS — N390 Urinary tract infection, site not specified: Secondary | ICD-10-CM | POA: Diagnosis present

## 2010-05-11 DIAGNOSIS — E876 Hypokalemia: Secondary | ICD-10-CM | POA: Diagnosis present

## 2010-05-11 DIAGNOSIS — D649 Anemia, unspecified: Secondary | ICD-10-CM | POA: Diagnosis present

## 2010-05-11 DIAGNOSIS — I2510 Atherosclerotic heart disease of native coronary artery without angina pectoris: Secondary | ICD-10-CM | POA: Diagnosis present

## 2010-05-11 DIAGNOSIS — R195 Other fecal abnormalities: Secondary | ICD-10-CM | POA: Diagnosis present

## 2010-05-11 DIAGNOSIS — R259 Unspecified abnormal involuntary movements: Secondary | ICD-10-CM | POA: Diagnosis present

## 2010-05-11 DIAGNOSIS — K222 Esophageal obstruction: Secondary | ICD-10-CM | POA: Diagnosis present

## 2010-05-11 DIAGNOSIS — Z9861 Coronary angioplasty status: Secondary | ICD-10-CM

## 2010-05-11 DIAGNOSIS — I4891 Unspecified atrial fibrillation: Principal | ICD-10-CM | POA: Diagnosis present

## 2010-05-11 DIAGNOSIS — K7290 Hepatic failure, unspecified without coma: Secondary | ICD-10-CM | POA: Diagnosis not present

## 2010-05-11 DIAGNOSIS — R110 Nausea: Secondary | ICD-10-CM | POA: Diagnosis present

## 2010-05-11 DIAGNOSIS — F29 Unspecified psychosis not due to a substance or known physiological condition: Secondary | ICD-10-CM | POA: Diagnosis present

## 2010-05-11 DIAGNOSIS — K7682 Hepatic encephalopathy: Secondary | ICD-10-CM | POA: Diagnosis not present

## 2010-05-11 DIAGNOSIS — E860 Dehydration: Secondary | ICD-10-CM | POA: Diagnosis present

## 2010-05-11 DIAGNOSIS — E039 Hypothyroidism, unspecified: Secondary | ICD-10-CM | POA: Diagnosis present

## 2010-05-11 HISTORY — DX: Essential (primary) hypertension: I10

## 2010-05-11 HISTORY — DX: Heart failure, unspecified: I50.9

## 2010-05-11 LAB — HEMOGLOBIN AND HEMATOCRIT, BLOOD
HCT: 32.9 % — ABNORMAL LOW (ref 36.0–46.0)
Hemoglobin: 10.7 g/dL — ABNORMAL LOW (ref 12.0–15.0)

## 2010-05-11 LAB — POCT I-STAT, CHEM 8
Creatinine, Ser: 2 mg/dL — ABNORMAL HIGH (ref 0.4–1.2)
HCT: 35 % — ABNORMAL LOW (ref 36.0–46.0)
Hemoglobin: 11.9 g/dL — ABNORMAL LOW (ref 12.0–15.0)
Potassium: 3.1 mEq/L — ABNORMAL LOW (ref 3.5–5.1)
Sodium: 141 mEq/L (ref 135–145)
TCO2: 32 mmol/L (ref 0–100)

## 2010-05-11 LAB — URINALYSIS, ROUTINE W REFLEX MICROSCOPIC
Bilirubin Urine: NEGATIVE
Hgb urine dipstick: NEGATIVE
Ketones, ur: NEGATIVE mg/dL
Nitrite: POSITIVE — AB
Protein, ur: NEGATIVE mg/dL
Urobilinogen, UA: 0.2 mg/dL (ref 0.0–1.0)

## 2010-05-11 LAB — CK TOTAL AND CKMB (NOT AT ARMC)
CK, MB: 2.4 ng/mL (ref 0.3–4.0)
CK, MB: 2 ng/mL (ref 0.3–4.0)
Relative Index: 2.4 (ref 0.0–2.5)
Total CK: 91 U/L (ref 7–177)

## 2010-05-11 LAB — TROPONIN I
Troponin I: 0.02 ng/mL (ref 0.00–0.06)
Troponin I: 0.02 ng/mL (ref 0.00–0.06)

## 2010-05-11 LAB — COMPREHENSIVE METABOLIC PANEL
ALT: 17 U/L (ref 0–35)
Albumin: 3.6 g/dL (ref 3.5–5.2)
Alkaline Phosphatase: 89 U/L (ref 39–117)
BUN: 53 mg/dL — ABNORMAL HIGH (ref 6–23)
Chloride: 103 mEq/L (ref 96–112)
Glucose, Bld: 116 mg/dL — ABNORMAL HIGH (ref 70–99)
Potassium: 3 mEq/L — ABNORMAL LOW (ref 3.5–5.1)
Sodium: 139 mEq/L (ref 135–145)
Total Bilirubin: 0.6 mg/dL (ref 0.3–1.2)
Total Protein: 6.7 g/dL (ref 6.0–8.3)

## 2010-05-11 LAB — POCT CARDIAC MARKERS
CKMB, poc: 1.7 ng/mL (ref 1.0–8.0)
Myoglobin, poc: 184 ng/mL (ref 12–200)

## 2010-05-11 LAB — DIFFERENTIAL
Basophils Absolute: 0 10*3/uL (ref 0.0–0.1)
Basophils Relative: 0 % (ref 0–1)
Eosinophils Absolute: 0.2 10*3/uL (ref 0.0–0.7)
Eosinophils Relative: 2 % (ref 0–5)
Monocytes Absolute: 0.9 10*3/uL (ref 0.1–1.0)
Monocytes Relative: 10 % (ref 3–12)

## 2010-05-11 LAB — CBC
MCH: 28.6 pg (ref 26.0–34.0)
MCHC: 32.9 g/dL (ref 30.0–36.0)
RDW: 14.9 % (ref 11.5–15.5)

## 2010-05-11 LAB — PROTIME-INR
INR: 5.19 (ref 0.00–1.49)
Prothrombin Time: 47.6 seconds — ABNORMAL HIGH (ref 11.6–15.2)

## 2010-05-11 LAB — MRSA PCR SCREENING: MRSA by PCR: NEGATIVE

## 2010-05-12 ENCOUNTER — Inpatient Hospital Stay (HOSPITAL_COMMUNITY): Payer: Medicare Other

## 2010-05-12 LAB — HEMOGLOBIN A1C
Hgb A1c MFr Bld: 5.9 % — ABNORMAL HIGH (ref <5.7)
Mean Plasma Glucose: 123 mg/dL — ABNORMAL HIGH (ref <117)

## 2010-05-12 LAB — LIPID PANEL
HDL: 40 mg/dL (ref >39)
Total CHOL/HDL Ratio: 3.2 RATIO
VLDL: 15 mg/dL (ref 0–40)

## 2010-05-12 LAB — BASIC METABOLIC PANEL
BUN: 25 mg/dL — ABNORMAL HIGH (ref 6–23)
Chloride: 108 mEq/L (ref 96–112)
Creatinine, Ser: 1.02 mg/dL (ref 0.4–1.2)
Glucose, Bld: 99 mg/dL (ref 70–99)
Potassium: 3.8 mEq/L (ref 3.5–5.1)

## 2010-05-12 LAB — CBC
HCT: 33 % — ABNORMAL LOW (ref 36.0–46.0)
MCHC: 31.8 g/dL (ref 30.0–36.0)
MCV: 88 fL (ref 78.0–100.0)
Platelets: 183 10*3/uL (ref 150–400)
RDW: 15.3 % (ref 11.5–15.5)

## 2010-05-12 LAB — CARDIAC PANEL(CRET KIN+CKTOT+MB+TROPI)
CK, MB: 1.7 ng/mL (ref 0.3–4.0)
Relative Index: INVALID (ref 0.0–2.5)
Troponin I: 0.03 ng/mL (ref 0.00–0.06)

## 2010-05-13 ENCOUNTER — Inpatient Hospital Stay (HOSPITAL_COMMUNITY): Payer: Medicare Other

## 2010-05-13 ENCOUNTER — Encounter (HOSPITAL_COMMUNITY): Payer: Self-pay | Admitting: Radiology

## 2010-05-13 LAB — COMPREHENSIVE METABOLIC PANEL
Albumin: 3.4 g/dL — ABNORMAL LOW (ref 3.5–5.2)
BUN: 17 mg/dL (ref 6–23)
Calcium: 9.1 mg/dL (ref 8.4–10.5)
Chloride: 111 mEq/L (ref 96–112)
Creatinine, Ser: 0.93 mg/dL (ref 0.4–1.2)
Total Bilirubin: 0.6 mg/dL (ref 0.3–1.2)
Total Protein: 6.8 g/dL (ref 6.0–8.3)

## 2010-05-13 LAB — PROTIME-INR
INR: 4.25 — ABNORMAL HIGH (ref 0.00–1.49)
Prothrombin Time: 40.8 seconds — ABNORMAL HIGH (ref 11.6–15.2)

## 2010-05-13 LAB — CBC
MCHC: 31.3 g/dL (ref 30.0–36.0)
Platelets: 199 10*3/uL (ref 150–400)
RDW: 15.3 % (ref 11.5–15.5)

## 2010-05-14 ENCOUNTER — Inpatient Hospital Stay (HOSPITAL_COMMUNITY): Payer: Medicare Other

## 2010-05-14 LAB — BASIC METABOLIC PANEL
CO2: 23 mEq/L (ref 19–32)
Calcium: 8.6 mg/dL (ref 8.4–10.5)
GFR calc Af Amer: >60 mL/min (ref >60)
GFR calc non Af Amer: >60 mL/min (ref >60)
Sodium: 142 mEq/L (ref 135–145)

## 2010-05-14 LAB — CBC
HCT: 32.6 % — ABNORMAL LOW (ref 36.0–46.0)
Hemoglobin: 10.5 g/dL — ABNORMAL LOW (ref 12.0–15.0)
MCH: 28.4 pg (ref 26.0–34.0)
MCHC: 32.2 g/dL (ref 30.0–36.0)
MCV: 87.3 fL (ref 78.0–100.0)
MCV: 88.1 fL (ref 78.0–100.0)
Platelets: 154 10*3/uL (ref 150–400)
RBC: 3.31 MIL/uL — ABNORMAL LOW (ref 3.87–5.11)
WBC: 8.8 10*3/uL (ref 4.0–10.5)

## 2010-05-14 LAB — PROTIME-INR: Prothrombin Time: 34.1 seconds — ABNORMAL HIGH (ref 11.6–15.2)

## 2010-05-15 ENCOUNTER — Inpatient Hospital Stay (HOSPITAL_COMMUNITY): Payer: Medicare Other

## 2010-05-15 LAB — CBC
HCT: 29.3 % — ABNORMAL LOW (ref 36.0–46.0)
Hemoglobin: 9.5 g/dL — ABNORMAL LOW (ref 12.0–15.0)
MCH: 28.4 pg (ref 26.0–34.0)
MCHC: 32.4 g/dL (ref 30.0–36.0)

## 2010-05-15 LAB — MAGNESIUM: Magnesium: 1.9 mg/dL (ref 1.5–2.5)

## 2010-05-15 LAB — BASIC METABOLIC PANEL
CO2: 23 mEq/L (ref 19–32)
Calcium: 8.7 mg/dL (ref 8.4–10.5)
Creatinine, Ser: 0.82 mg/dL (ref 0.4–1.2)
Glucose, Bld: 100 mg/dL — ABNORMAL HIGH (ref 70–99)

## 2010-05-15 MED ORDER — TECHNETIUM TC 99M TETROFOSMIN IV KIT
30.0000 | PACK | Freq: Once | INTRAVENOUS | Status: AC | PRN
  Administered 2010-05-15: 30 via INTRAVENOUS

## 2010-05-15 MED ORDER — TECHNETIUM TC 99M TETROFOSMIN IV KIT
10.0000 | PACK | Freq: Once | INTRAVENOUS | Status: AC | PRN
  Administered 2010-05-15: 10 via INTRAVENOUS

## 2010-05-16 ENCOUNTER — Inpatient Hospital Stay (HOSPITAL_COMMUNITY): Payer: Medicare Other

## 2010-05-16 LAB — URINE CULTURE: Culture  Setup Time: 201202110027

## 2010-05-16 LAB — HEPARIN LEVEL (UNFRACTIONATED): Heparin Unfractionated: 0.13 IU/mL — ABNORMAL LOW (ref 0.30–0.70)

## 2010-05-16 LAB — PROTIME-INR: INR: 1.79 — ABNORMAL HIGH (ref 0.00–1.49)

## 2010-05-17 LAB — CBC
HCT: 30 % — ABNORMAL LOW (ref 36.0–46.0)
MCHC: 32 g/dL (ref 30.0–36.0)
MCV: 88 fL (ref 78.0–100.0)
RDW: 15.5 % (ref 11.5–15.5)

## 2010-05-17 LAB — BASIC METABOLIC PANEL
BUN: 10 mg/dL (ref 6–23)
Calcium: 8.4 mg/dL (ref 8.4–10.5)
GFR calc non Af Amer: >60 mL/min (ref >60)
Glucose, Bld: 100 mg/dL — ABNORMAL HIGH (ref 70–99)

## 2010-05-17 LAB — HEPARIN LEVEL (UNFRACTIONATED): Heparin Unfractionated: 0.43 IU/mL (ref 0.30–0.70)

## 2010-05-18 LAB — BASIC METABOLIC PANEL
BUN: 9 mg/dL (ref 6–23)
Calcium: 8.5 mg/dL (ref 8.4–10.5)
GFR calc non Af Amer: >60 mL/min (ref >60)
Glucose, Bld: 92 mg/dL (ref 70–99)
Potassium: 3.2 mEq/L — ABNORMAL LOW (ref 3.5–5.1)

## 2010-05-18 LAB — CBC
HCT: 28.9 % — ABNORMAL LOW (ref 36.0–46.0)
MCHC: 31.8 g/dL (ref 30.0–36.0)
MCV: 86.8 fL (ref 78.0–100.0)
RDW: 15.6 % — ABNORMAL HIGH (ref 11.5–15.5)

## 2010-05-19 ENCOUNTER — Inpatient Hospital Stay (HOSPITAL_COMMUNITY): Payer: Medicare Other

## 2010-05-19 LAB — CBC
HCT: 30.3 % — ABNORMAL LOW (ref 36.0–46.0)
Hemoglobin: 9.8 g/dL — ABNORMAL LOW (ref 12.0–15.0)
MCV: 87.3 fL (ref 78.0–100.0)
RDW: 16.1 % — ABNORMAL HIGH (ref 11.5–15.5)
WBC: 9.6 10*3/uL (ref 4.0–10.5)

## 2010-05-19 LAB — CARDIAC PANEL(CRET KIN+CKTOT+MB+TROPI)
CK, MB: 5 ng/mL — ABNORMAL HIGH (ref 0.3–4.0)
Relative Index: INVALID (ref 0.0–2.5)
Total CK: 98 U/L (ref 7–177)

## 2010-05-19 LAB — PROTIME-INR: INR: 3.14 — ABNORMAL HIGH (ref 0.00–1.49)

## 2010-05-19 NOTE — H&P (Signed)
NAME:  [name] Luna, [name] Luna NO.:  [id number]  MEDICAL RECORD NO.:  [id number]           PATIENT TYPE:  E  LOCATION:  [facility]                         FACILITY:  [facility]  PHYSICIAN:  Nicki Guadalajara, M.D.     DATE OF BIRTH:  21-Oct-1920  DATE OF ADMISSION:  05/11/2010 DATE OF DISCHARGE:                             HISTORY & PHYSICAL   CHIEF COMPLAINT:  Chest pain and shortness of breath.  HISTORY OF PRESENT ILLNESS:  An 77 year old white female with history of coronary disease and recent admission for diastolic heart failure in combination with atrial fibrillation on April 21, 2010, she was discharged April 23, 2010.  Now presents after waking from sleep to go to the restroom and en route to the bathroom, developed very significant shortness of breath as well as left anterior chest pain.  She called EMS and came to the emergency room.  On questioning, she states it is more shortness of breath than chest pain.  She was nauseated with it.  No diaphoresis, but she was somewhat lightheaded.  Her family states she had been complaining of weakness during the week, but no other complaints.  Since her discharge from the hospital on January 22, she has seen Dr. Alanda Amass back in the hospital.  Her pacemaker was interrogated and she had gone into atrial fib approximately December 29 or March 31, 2010 and her pacemaker had switched to a VVIR mode.  He felt that due to the AFib and some diastolic dysfunction, that is why she developed a heart failure.  He felt getting her back into a sinus rhythm would be her best medical therapy.  So on January 25, she was placed on amiodarone 200 mg twice a day and started on Coumadin.  Her Lasix had been decreased as well as her Diovan at the same time.  She other than weakness has been doing fairly well until she presents today.  PAST MEDICAL HISTORY:  Positive for paroxysmal AFib with a small burst. Previously, she had been not a candidate  for Coumadin, but Dr. Alanda Amass feels that if we can treat her short term with Coumadin until we can get her back into sinus and then go back to aspirin and Plavix alone.  The patient has coronary disease with remote PCI with cutting balloon atherectomy and DES stenting of a high-grade LAD lesion in 2008.  Then in October 2011, she underwent re-cath and had 75% stenosis adjacent to her prior LAD stent, also she had overlapping Promus permanent drug- eluting stent placed.  She has a history of complete heart block and has a permanent transvenous pacemaker, a Medtronic Versa implanted in October 2009, currently in the VVIR mode, after mode switching due to atrial fibrillation.  Last echo in April 11, 2010, EF was 50-55% with diastolic relaxation abnormality.  Dyslipidemia with an LDL of 89 in December 2011.  She has hypothyroidism, treated hypertension, and a history of orthostatic hypotension.  ALLERGIES:  PENICILLIN, CODEINE, PREDNISONE, ISOSORBIDE MONONITRATE, VALIUM AND ATIVAN and I believe the ISOSORBIDE caused syncope due to hypotension.  CURRENT MEDICATIONS: 1. Ocuvite 1 tablet twice a day. 2. Multivitamins  1 tablet every morning. 3. Claritin 10 mg one daily. 4  Calcium carbonate 750 mg. 1. Tums Extra Strength 1 tablet twice a day. 2. Coumadin, she was taking 2.5 mg daily. 3. Amiodarone 200 mg one twice a day. 4. Simvastatin 20 mg one daily at bedtime. 5. Protonix 40 mg 2 tablets daily. 6. Metoprolol tartrate 100 mg tablets 1-1/2 tablets twice daily. 7. Levothyroxine 75 mcg one every morning. 8. Hydrochlorothiazide 12.5 mg one every morning. 9. Plavix 75 mg one daily. 10.Valsartan 80 mg daily. 11.Potassium chloride 20 mEq one daily. 12.Nitroglycerin sublingual p.r.n. as needed. 13.Aspirin 81 mg 2 tablets every morning. 14.Furosemide 40 mg 1 tablet daily.  SOCIAL HISTORY:  She is widowed, with 2 children, 4 grandchildren, 5 great-grandchildren.  Never been a  smoker.  She lives with her son.  No alcohol use.  No illicit drug use.  FAMILY HISTORY:  Not significant to this admission.  REVIEW OF SYSTEMS:  GENERAL:  No colds or fevers, just feeling weak. HEENT:  No blurred vision.  PULMONARY:  Positive shortness of breath today.  CARDIOVASCULAR:  Chest pain today.  GI:  No diarrhea, constipation, or melena.  GU:  No hematuria, dysuria.  NEURO:  No syncope, but positive lightheadedness today.  EXTREMITIES:  No complaints.  No weaknesses.  ENDOCRINE:  No diabetes.  She does have hypothyroidism and no acute problems.  PHYSICAL EXAMINATION:  VITAL SIGNS:  Blood pressure 137/72, pulse 88, respirations 20, temperature 97.9, oxygen saturation 99%. GENERAL:  Alert, oriented white female, still having some mild chest discomfort and mild shortness of breath. SKIN:  Warm and dry.  Brisk capillary refill. HEENT: Normocephalic.  Sclera clear. NECK:  Supple.  JVD is 7 cm. LUNGS:  Fairly clear. HEART:  Irregularly irregular with 1/6 systolic murmur. ABDOMEN:  Soft, nontender, positive bowel sounds.  I do not palpate liver, spleen or masses.  LOWER EXTREMITIES:  With no edema.  Positive varicosities.  Pedal pulses are present.  NEUROLOGIC:  Alert and oriented x3.  Follows commands.  LABORATORY DATA:  Potassium 3.1, BUN 56, creatinine 2.0.  INR is elevated at 1.19.  Hemoglobin 1.6, hematocrit 35.3.  Initial cardiac markers are negative.  Chest x-ray actually shows improvement from previous chest x-ray.  IMPRESSION: 1. Chest pain and shortness of breath, possible anginal equivalent. 2. Coronary disease with previous stent to the left anterior     descending x2. 3. Recent history of diastolic heart failure on diuretic, now with     dehydration. 4. Atrial fibrillation, new since December 29 or 30, 2011 with plans     for cardioversion in the future, on Coumadin and amiodarone. 5. Elevated INR, possibly secondary to the amiodarone.  Plans to hold      Coumadin until INR 2.5 or less and restart unless cardiac     catheterization as needed. 6. Acute renal failure secondary to dehydration.  We will hold     diuretics currently and follow creatinine and slowly hydrate gentle     hydration. 7. Hypertension, currently controlled. 8. History of hypothyroidism.  We will check TSH on amiodarone. 9. Hypokalemia.  PLAN: 1. Admit to step-down unit, IV nitroglycerin carefully as it may     decrease her blood pressure.  She has a history of syncope with     Imdur.  Hold Coumadin until INR more therapeutic unless enzymes are     positive and then she would need cardiac catheterization. 2. Replace potassium as she is hypokalemic. 3. We will check  a stool to ensure she is not guaiac positive with INR     5.19.  We will continue amiodarone and after admission, if cardiac     enzymes were negative, there may be a place for TEE cardioversion     on this patient if she does seem to do much better in sinus rhythm,     then she does in atrial fibrillation.  We will continue to watch     closely.  Dr. Tresa Endo is seen her and evaluated her.     [name]. Annie Paras, N.P.   ______________________________ Nicki Guadalajara, M.D.    LRI/MEDQ  D:  05/11/2010  T:  05/11/2010  Job:  [id number]  cc:   Gerlene Burdock A. Alanda Amass, M.D. Jeffrey A. Tawanna Cooler, MD  Electronically Signed by Nada Boozer N.P. on 05/15/2010 02:19:45 PM Electronically Signed by Nicki Guadalajara M.D. on 05/19/2010 11:28:55 AM

## 2010-05-20 ENCOUNTER — Inpatient Hospital Stay (HOSPITAL_COMMUNITY): Payer: Medicare Other

## 2010-05-20 LAB — CARDIAC PANEL(CRET KIN+CKTOT+MB+TROPI)
CK, MB: 5.1 ng/mL — ABNORMAL HIGH (ref 0.3–4.0)
Relative Index: 4.7 — ABNORMAL HIGH (ref 0.0–2.5)

## 2010-05-20 LAB — COMPREHENSIVE METABOLIC PANEL
Albumin: 3.1 g/dL — ABNORMAL LOW (ref 3.5–5.2)
BUN: 11 mg/dL (ref 6–23)
Creatinine, Ser: 1 mg/dL (ref 0.4–1.2)
Total Bilirubin: 1 mg/dL (ref 0.3–1.2)
Total Protein: 6.6 g/dL (ref 6.0–8.3)

## 2010-05-20 LAB — BASIC METABOLIC PANEL
GFR calc Af Amer: >60 mL/min (ref >60)
GFR calc non Af Amer: >60 mL/min (ref >60)
Glucose, Bld: 112 mg/dL — ABNORMAL HIGH (ref 70–99)
Potassium: 3.6 mEq/L (ref 3.5–5.1)
Sodium: 142 mEq/L (ref 135–145)

## 2010-05-20 LAB — BRAIN NATRIURETIC PEPTIDE: Pro B Natriuretic peptide (BNP): 409 pg/mL — ABNORMAL HIGH (ref 0.0–100.0)

## 2010-05-20 LAB — CBC
MCH: 28.2 pg (ref 26.0–34.0)
MCV: 86.5 fL (ref 78.0–100.0)
Platelets: 208 10*3/uL (ref 150–400)
RDW: 16.3 % — ABNORMAL HIGH (ref 11.5–15.5)

## 2010-05-20 LAB — PROTIME-INR: Prothrombin Time: 33.5 seconds — ABNORMAL HIGH (ref 11.6–15.2)

## 2010-05-21 ENCOUNTER — Inpatient Hospital Stay (HOSPITAL_COMMUNITY): Payer: Medicare Other

## 2010-05-21 LAB — CBC
Hemoglobin: 9 g/dL — ABNORMAL LOW (ref 12.0–15.0)
MCHC: 32.6 g/dL (ref 30.0–36.0)
Platelets: 206 10*3/uL (ref 150–400)
RBC: 3.18 MIL/uL — ABNORMAL LOW (ref 3.87–5.11)

## 2010-05-21 LAB — AMMONIA: Ammonia: <8 umol/L — ABNORMAL LOW (ref 11–35)

## 2010-05-21 LAB — PROTIME-INR
INR: 2.4 — ABNORMAL HIGH (ref 0.00–1.49)
Prothrombin Time: 26.3 seconds — ABNORMAL HIGH (ref 11.6–15.2)

## 2010-05-21 LAB — BASIC METABOLIC PANEL
CO2: 24 mEq/L (ref 19–32)
Calcium: 8.8 mg/dL (ref 8.4–10.5)
GFR calc Af Amer: >60 mL/min (ref >60)
Sodium: 146 mEq/L — ABNORMAL HIGH (ref 135–145)

## 2010-05-21 LAB — SEDIMENTATION RATE: Sed Rate: 92 mm/hr — ABNORMAL HIGH (ref 0–22)

## 2010-05-21 LAB — PHOSPHORUS: Phosphorus: 2.8 mg/dL (ref 2.3–4.6)

## 2010-05-22 ENCOUNTER — Other Ambulatory Visit (HOSPITAL_COMMUNITY): Payer: Medicare Other

## 2010-05-22 LAB — COMPREHENSIVE METABOLIC PANEL
Alkaline Phosphatase: 83 U/L (ref 39–117)
BUN: 8 mg/dL (ref 6–23)
CO2: 28 mEq/L (ref 19–32)
Chloride: 108 mEq/L (ref 96–112)
Creatinine, Ser: 0.75 mg/dL (ref 0.4–1.2)
GFR calc non Af Amer: >60 mL/min (ref >60)
Glucose, Bld: 92 mg/dL (ref 70–99)
Potassium: 3.2 mEq/L — ABNORMAL LOW (ref 3.5–5.1)
Total Bilirubin: 1.2 mg/dL (ref 0.3–1.2)

## 2010-05-22 LAB — BASIC METABOLIC PANEL
Calcium: 8.6 mg/dL (ref 8.4–10.5)
Creatinine, Ser: 0.78 mg/dL (ref 0.4–1.2)
GFR calc Af Amer: >60 mL/min (ref >60)
GFR calc non Af Amer: >60 mL/min (ref >60)
Sodium: 143 mEq/L (ref 135–145)

## 2010-05-22 LAB — BRAIN NATRIURETIC PEPTIDE: Pro B Natriuretic peptide (BNP): 325 pg/mL — ABNORMAL HIGH (ref 0.0–100.0)

## 2010-05-22 LAB — PROTIME-INR: INR: 2.13 — ABNORMAL HIGH (ref 0.00–1.49)

## 2010-05-23 ENCOUNTER — Inpatient Hospital Stay (HOSPITAL_COMMUNITY): Payer: Medicare Other

## 2010-05-23 LAB — TSH: TSH: 4.89 u[IU]/mL — ABNORMAL HIGH (ref 0.350–4.500)

## 2010-05-23 LAB — PROTIME-INR
INR: 2.18 — ABNORMAL HIGH (ref 0.00–1.49)
Prothrombin Time: 24.4 seconds — ABNORMAL HIGH (ref 11.6–15.2)

## 2010-05-24 ENCOUNTER — Inpatient Hospital Stay (HOSPITAL_COMMUNITY): Payer: Medicare Other

## 2010-05-24 LAB — BASIC METABOLIC PANEL
CO2: 27 mEq/L (ref 19–32)
Calcium: 8.7 mg/dL (ref 8.4–10.5)
Chloride: 109 mEq/L (ref 96–112)
Creatinine, Ser: 0.81 mg/dL (ref 0.4–1.2)
GFR calc Af Amer: >60 mL/min (ref >60)
Glucose, Bld: 101 mg/dL — ABNORMAL HIGH (ref 70–99)
Sodium: 145 mEq/L (ref 135–145)

## 2010-05-24 LAB — CBC
HCT: 29 % — ABNORMAL LOW (ref 36.0–46.0)
Hemoglobin: 9.2 g/dL — ABNORMAL LOW (ref 12.0–15.0)
MCH: 28.3 pg (ref 26.0–34.0)
MCHC: 31.7 g/dL (ref 30.0–36.0)
RBC: 3.25 MIL/uL — ABNORMAL LOW (ref 3.87–5.11)

## 2010-05-27 NOTE — Consult Note (Signed)
NAME:  [name] Luna, SALTZ.:  [id number]  MEDICAL RECORD NO.:  [id number]           PATIENT TYPE:  LOCATION:                                 FACILITY:  PHYSICIAN:  Thana Farr, MD    DATE OF BIRTH:  [birth year]  DATE OF CONSULTATION:  05/21/2010 DATE OF DISCHARGE:                                CONSULTATION   CONSULT CALLED BY:  Nicki Guadalajara, MD.  HISTORY:  [name] Luna is an 77 year old female who was admitted with chest pain and shortness of breath.  She was found to have diastolic heart failure with atrial fibrillation.  The patient has been started on Coumadin.  Did fairly well until last weekend when she has some altered mental status.  This seemed to resolve.  For the past 2-3 days, she has again developed an altered mental status.  She is confused at times. She is having visual hallucinations.  She reports that she sees couples and children in the room.  Some of them she knows and others she does not know.  She is not clear that these are hallucinations.  She has also developed a tremor as well.  She had a mild tremor prior to admission but nothing on the order of what has been present for the past 2 to 3 days.  Her son is present in the room and currently reports it at baseline.  Although she is unable to drive because of macular degeneration, she is able to keep house, cook, dress herself, bathe herself, and move around independently.  PAST MEDICAL HISTORY:  Paroxysmal atrial fibrillation, coronary artery disease, complete heart block with permanent transvenous pacemaker, dyslipidemia, hypothyroidism, and hypertension.  MEDICATIONS:  Amiodarone, aspirin, calcium, Plavix, diltiazem, Lasix, levothyroxine, loratadine, metoprolol, multivitamin, Benicar, Protonix, Zocor, Coumadin.  These medications are very similar to the medications the patient was on at home.  Other than the Coumadin, there are no new medications.  SOCIAL HISTORY:  The patient is  widowed.  She lives with her son.  She has no history of alcohol, tobacco, or illicit drug abuse.  PHYSICAL EXAM:  Blood pressure 122/66, heart rate 83, respiratory rate 18, T-max 98.3.  On mental status testing, the patient is alert and oriented x3.  She knows some information about current events.  She is able to follow commands.  On cranial nerve testing II, disks flat bilaterally.  Visual fields are grossly intact, but the patient is blinking to bilateral confrontation.  III, IV, VI, extraocular movements intact.  V and VII, smile is symmetric.  VIII, grossly intact.  IX and VII, positive gag.  XI, bilateral shoulder shrug.  XII, midline tongue extension.  On motor exam, the patient gives 5/5 strength bilaterally. On sensory testing, pinprick and light touch are intact bilaterally. Deep tendon reflexes are 1+ throughout.  Plantars are downgoing bilaterally.  On cerebellar testing, finger-to-nose is difficult for the patient to perform secondary to slight limitation, but there is no evidence of any dysmetria.  Heel-to-shin is intact.  There is noted a very coarse trauma of both upper extremities that is activated by motion.  This tremor involves her  head and as well and interferes with voice quality.  LABORATORY DATA:  White blood cell count 9.3, platelet count 206, hemoglobin and hematocrit 9.0 and 27.6 respectively.  PT 26.3, INR 2.40. Sodium 146, potassium 3.5, chloride 110, CO2 of 24, BUN 9, creatinine 0.86, glucose 114, calcium 8.88.  BNP 409.  Magnesium 2.0.  Last head CT was performed on February 11 and showed no acute changes.  There were some stable small vessel ischemic changes.  ASSESSMENT:  [name] Luna is an 77 year old female with mental status changes, notably hallucinations and tremor.  They do not seem to be related to medications since there did not seem to be anything significantly new in her medication regimen.  It also does not seem that at baseline she had  significant dementia.  She has been on Coumadin and her previous INRs have actually been supratherapeutic throughout with a possibility of a hemorrhage related to such.  Also, other metabolic abnormalities will need to be addressed as well.  It does seem that LFTs have been checked recently and are all within normal limits including SGOT, SGPT, alk phos, total bili, and protein.  PLAN: 1. Head CT without contrast. 2. EEG. 3. Serum phosphorus, ESR, and TSH.  Serum ammonia to be drawn as well.          ______________________________ Thana Farr, MD     LR/MEDQ  D:  05/21/2010  T:  05/22/2010  Job:  [id number]  Electronically Signed by Thana Farr MD on 05/27/2010 05:25:32 PM

## 2010-06-01 NOTE — Procedures (Signed)
EEG NUMBER:  [id number].  REFERRING PHYSICIAN:  Nicki Guadalajara, MD  FAMILY HISTORY:  An 77 year old patient being evaluated for mental status changes, hallucinations, and tremors.  This is a portable EEG recorded with the patient awake and drowsy using standard 10/20 electrode placement on a 17-channel machine.  Background awake rhythm consists of 8-9 Hz alpha which is of diminished amplitude synchronous reactive.  Intermittent 6-7 Hz theta slowing is seen particularly during drowsiness.  Changes of drowsiness and light sleep are achieved and show normal physiological findings.  No paroxysmal epileptiform activity, spikes or sharp waves are noted. Length of the recording is 20.5 minutes, technical component is average. EKG tracing reveals regular rhythm with ventricular ectopic beats which are frequent.  Hyperventilation not performed.  Photic stimulation US unremarkable.  IMPRESSION:  This EEG performed during awake and light sleep status was within normal limits.  No definite epileptiform features were noted.     Nairi Oswald P. Pearlean Brownie, MD    [id number] D:  05/23/2010 17:18:31  T:  05/23/2010 21:40:07  Job #:  [id number]  cc:   Nicki Guadalajara, M.D. Fax: [phone]

## 2010-06-14 LAB — CBC
HCT: 37.8 % (ref 36.0–46.0)
HCT: 38.4 % (ref 36.0–46.0)
Hemoglobin: 11.5 g/dL — ABNORMAL LOW (ref 12.0–15.0)
Hemoglobin: 12.2 g/dL (ref 12.0–15.0)
Hemoglobin: 12.4 g/dL (ref 12.0–15.0)
MCH: 27.8 pg (ref 26.0–34.0)
MCH: 27.9 pg (ref 26.0–34.0)
MCH: 28.3 pg (ref 26.0–34.0)
MCH: 28.4 pg (ref 26.0–34.0)
MCHC: 32.3 g/dL (ref 30.0–36.0)
MCHC: 32.3 g/dL (ref 30.0–36.0)
MCHC: 32.7 g/dL (ref 30.0–36.0)
MCV: 88 fL (ref 78.0–100.0)
Platelets: 141 10*3/uL — ABNORMAL LOW (ref 150–400)
Platelets: 156 10*3/uL (ref 150–400)
RBC: 4.38 MIL/uL (ref 3.87–5.11)
RDW: 15 % (ref 11.5–15.5)
RDW: 15.1 % (ref 11.5–15.5)
RDW: 15.1 % (ref 11.5–15.5)

## 2010-06-14 LAB — CARDIAC PANEL(CRET KIN+CKTOT+MB+TROPI)
CK, MB: 3.4 ng/mL (ref 0.3–4.0)
CK, MB: 3.4 ng/mL (ref 0.3–4.0)
Relative Index: 2.8 — ABNORMAL HIGH (ref 0.0–2.5)
Total CK: 141 U/L (ref 7–177)
Total CK: 146 U/L (ref 7–177)
Total CK: 169 U/L (ref 7–177)
Troponin I: 0.03 ng/mL (ref 0.00–0.06)
Troponin I: 0.04 ng/mL (ref 0.00–0.06)

## 2010-06-14 LAB — BASIC METABOLIC PANEL
BUN: 10 mg/dL (ref 6–23)
BUN: 11 mg/dL (ref 6–23)
CO2: 26 mEq/L (ref 19–32)
CO2: 28 mEq/L (ref 19–32)
CO2: 29 mEq/L (ref 19–32)
Calcium: 8.6 mg/dL (ref 8.4–10.5)
Calcium: 8.8 mg/dL (ref 8.4–10.5)
Calcium: 9 mg/dL (ref 8.4–10.5)
Chloride: 101 mEq/L (ref 96–112)
Chloride: 104 mEq/L (ref 96–112)
Creatinine, Ser: 0.75 mg/dL (ref 0.4–1.2)
Creatinine, Ser: 0.77 mg/dL (ref 0.4–1.2)
GFR calc Af Amer: >60 mL/min (ref >60)
GFR calc Af Amer: >60 mL/min (ref >60)
GFR calc non Af Amer: >60 mL/min (ref >60)
Glucose, Bld: 101 mg/dL — ABNORMAL HIGH (ref 70–99)
Glucose, Bld: 110 mg/dL — ABNORMAL HIGH (ref 70–99)
Glucose, Bld: 93 mg/dL (ref 70–99)
Potassium: 3.4 mEq/L — ABNORMAL LOW (ref 3.5–5.1)
Potassium: 3.4 mEq/L — ABNORMAL LOW (ref 3.5–5.1)
Sodium: 135 mEq/L (ref 135–145)
Sodium: 137 mEq/L (ref 135–145)
Sodium: 138 mEq/L (ref 135–145)

## 2010-06-14 LAB — DIFFERENTIAL
Basophils Absolute: 0 10*3/uL (ref 0.0–0.1)
Basophils Absolute: 0 10*3/uL (ref 0.0–0.1)
Basophils Relative: 0 % (ref 0–1)
Basophils Relative: 0 % (ref 0–1)
Basophils Relative: 0 % (ref 0–1)
Basophils Relative: 0 % (ref 0–1)
Eosinophils Absolute: 0.2 10*3/uL (ref 0.0–0.7)
Eosinophils Absolute: 0.2 10*3/uL (ref 0.0–0.7)
Eosinophils Absolute: 0.3 10*3/uL (ref 0.0–0.7)
Eosinophils Absolute: 0.3 10*3/uL (ref 0.0–0.7)
Eosinophils Relative: 3 % (ref 0–5)
Eosinophils Relative: 3 % (ref 0–5)
Lymphs Abs: 1.2 10*3/uL (ref 0.7–4.0)
Monocytes Absolute: 0.9 10*3/uL (ref 0.1–1.0)
Monocytes Absolute: 0.9 10*3/uL (ref 0.1–1.0)
Monocytes Absolute: 1.3 10*3/uL — ABNORMAL HIGH (ref 0.1–1.0)
Monocytes Relative: 11 % (ref 3–12)
Monocytes Relative: 14 % — ABNORMAL HIGH (ref 3–12)
Neutro Abs: 5.4 10*3/uL (ref 1.7–7.7)
Neutrophils Relative %: 62 % (ref 43–77)
Neutrophils Relative %: 67 % (ref 43–77)
Neutrophils Relative %: 75 % (ref 43–77)

## 2010-06-14 LAB — HEPARIN LEVEL (UNFRACTIONATED): Heparin Unfractionated: 0.4 IU/mL (ref 0.30–0.70)

## 2010-06-15 LAB — MAGNESIUM: Magnesium: 2.1 mg/dL (ref 1.5–2.5)

## 2010-06-15 LAB — DIFFERENTIAL
Basophils Relative: 0 % (ref 0–1)
Eosinophils Absolute: 0.1 10*3/uL (ref 0.0–0.7)
Eosinophils Relative: 1 % (ref 0–5)
Monocytes Absolute: 0.9 10*3/uL (ref 0.1–1.0)
Monocytes Relative: 9 % (ref 3–12)

## 2010-06-15 LAB — CARDIAC PANEL(CRET KIN+CKTOT+MB+TROPI): Total CK: 145 U/L (ref 7–177)

## 2010-06-15 LAB — HEPATIC FUNCTION PANEL
Albumin: 3.5 g/dL (ref 3.5–5.2)
Total Bilirubin: 0.8 mg/dL (ref 0.3–1.2)

## 2010-06-15 LAB — CBC
MCH: 28.4 pg (ref 26.0–34.0)
MCHC: 32.4 g/dL (ref 30.0–36.0)
Platelets: 160 10*3/uL (ref 150–400)
RBC: 4.16 MIL/uL (ref 3.87–5.11)

## 2010-06-15 LAB — BASIC METABOLIC PANEL
CO2: 30 mEq/L (ref 19–32)
Calcium: 9.1 mg/dL (ref 8.4–10.5)
Creatinine, Ser: 0.84 mg/dL (ref 0.4–1.2)
GFR calc Af Amer: >60 mL/min (ref >60)
Glucose, Bld: 109 mg/dL — ABNORMAL HIGH (ref 70–99)

## 2010-06-15 LAB — TSH: TSH: 1.452 u[IU]/mL (ref 0.350–4.500)

## 2010-06-15 LAB — TROPONIN I: Troponin I: 0.03 ng/mL (ref 0.00–0.06)

## 2010-06-15 LAB — URINALYSIS, ROUTINE W REFLEX MICROSCOPIC
Bilirubin Urine: NEGATIVE
Nitrite: NEGATIVE
Specific Gravity, Urine: 1.006 (ref 1.005–1.030)
Urobilinogen, UA: 0.2 mg/dL (ref 0.0–1.0)

## 2010-06-15 LAB — CK TOTAL AND CKMB (NOT AT ARMC): Total CK: 180 U/L — ABNORMAL HIGH (ref 7–177)

## 2010-06-15 LAB — APTT: aPTT: 28 seconds (ref 24–37)

## 2010-06-23 ENCOUNTER — Ambulatory Visit (INDEPENDENT_AMBULATORY_CARE_PROVIDER_SITE_OTHER): Payer: Medicare Other | Admitting: Internal Medicine

## 2010-06-23 ENCOUNTER — Encounter: Payer: Self-pay | Admitting: Internal Medicine

## 2010-06-23 DIAGNOSIS — R0989 Other specified symptoms and signs involving the circulatory and respiratory systems: Secondary | ICD-10-CM

## 2010-06-23 DIAGNOSIS — Z79899 Other long term (current) drug therapy: Secondary | ICD-10-CM

## 2010-06-23 DIAGNOSIS — E876 Hypokalemia: Secondary | ICD-10-CM

## 2010-06-23 DIAGNOSIS — E039 Hypothyroidism, unspecified: Secondary | ICD-10-CM

## 2010-06-23 DIAGNOSIS — R0600 Dyspnea, unspecified: Secondary | ICD-10-CM

## 2010-06-23 DIAGNOSIS — D649 Anemia, unspecified: Secondary | ICD-10-CM

## 2010-06-23 LAB — BASIC METABOLIC PANEL
CO2: 31 mEq/L (ref 19–32)
Calcium: 9 mg/dL (ref 8.4–10.5)
Creatinine, Ser: 1 mg/dL (ref 0.4–1.2)
Glucose, Bld: 103 mg/dL — ABNORMAL HIGH (ref 70–99)

## 2010-06-23 LAB — CBC WITH DIFFERENTIAL/PLATELET
Basophils Relative: 0.6 % (ref 0.0–3.0)
Eosinophils Relative: 2 % (ref 0.0–5.0)
HCT: 29.9 % — ABNORMAL LOW (ref 36.0–46.0)
Hemoglobin: 10 g/dL — ABNORMAL LOW (ref 12.0–15.0)
Lymphs Abs: 1.5 10*3/uL (ref 0.7–4.0)
Monocytes Relative: 14.7 % — ABNORMAL HIGH (ref 3.0–12.0)
Neutro Abs: 4.2 10*3/uL (ref 1.4–7.7)
RBC: 3.36 Mil/uL — ABNORMAL LOW (ref 3.87–5.11)
RDW: 18.7 % — ABNORMAL HIGH (ref 11.5–14.6)
WBC: 6.9 10*3/uL (ref 4.5–10.5)

## 2010-06-23 MED ORDER — FUROSEMIDE 40 MG PO TABS: 40.0000 mg | ORAL_TABLET | Freq: Two times a day (BID) | ORAL | Status: DC

## 2010-06-24 ENCOUNTER — Encounter: Payer: Self-pay | Admitting: Internal Medicine

## 2010-06-24 NOTE — Progress Notes (Signed)
  Subjective:    Patient ID: [name] Luna, female    DOB: 25-May-1920, 77 y.o.   MRN: [id number]  HPI  Pt presents to clinic for evaluation of leg swelling. Recently hospitalized in Feb for CP with h/o CAD. Noted to have PAF and is maintained on coumadin. Was mildly anemic during stay and underwent EGD and dilatation due to UES stenosis. Labs during visit also indicate hypokalemia and mildly elevated TSH with h/o hypothyroidism. Cath 10/11 reviewed and showing nl LV fxn. States that since discharge has noted mild bilateral le edema and mild doe. Has occurred over last several days. No exacerbating or alleviating factors. No cp. Lasix dose reportedly 40mg  bid at discharge and now is 40mg  qd. No other complaint.  Reviewed pmh, medications and allergies.    Review of Systems  Constitutional: Negative for fever and chills.  Respiratory: Positive for shortness of breath. Negative for cough and wheezing.   Cardiovascular: Negative for chest pain.       Objective:   Physical Exam  Nursing note and vitals reviewed. Constitutional: She appears well-developed and well-nourished. No distress.  HENT:  Head: Normocephalic and atraumatic.  Eyes: Conjunctivae are normal. No scleral icterus.  Neck: Neck supple. No JVD present.  Cardiovascular: Normal rate, regular rhythm and normal heart sounds.  Exam reveals no gallop and no friction rub.   No murmur heard. Pulmonary/Chest: Effort normal and breath sounds normal. No respiratory distress. She has no wheezes. She has no rales.  Musculoskeletal: She exhibits edema.       +1 bilateral le edema  Neurological: She is alert.  Skin: Skin is warm and dry. She is not diaphoretic.  Psychiatric: She has a normal mood and affect.          Assessment & Plan:

## 2010-06-25 DIAGNOSIS — E876 Hypokalemia: Secondary | ICD-10-CM | POA: Insufficient documentation

## 2010-06-25 DIAGNOSIS — R0600 Dyspnea, unspecified: Secondary | ICD-10-CM | POA: Insufficient documentation

## 2010-06-25 DIAGNOSIS — D649 Anemia, unspecified: Secondary | ICD-10-CM | POA: Insufficient documentation

## 2010-06-25 NOTE — Assessment & Plan Note (Signed)
Recheck chem7

## 2010-06-25 NOTE — Assessment & Plan Note (Signed)
Clinically suggestive of mild overload. Increase lasix 40mg  bid x 5 days. Monitor breathing and leg swelling for improvement. Followup if no improvement or worsening.

## 2010-06-25 NOTE — Assessment & Plan Note (Signed)
Mild. Check CBC with c/o dyspnea.

## 2010-06-25 NOTE — Assessment & Plan Note (Signed)
Doubt clinical significance of mildly elevated tsh during hospitalization. Recheck TSH today.

## 2010-06-26 ENCOUNTER — Telehealth: Payer: Self-pay

## 2010-06-26 NOTE — Telephone Encounter (Signed)
Message copied by Kyung Rudd on Mon Jun 26, 2010  1:47 PM ------      Message from: Letitia Libra, Maisie Fus      Created: Sun Jun 25, 2010  2:50 PM       Kidney test, potassium and thyroid test nl. Still is anemic but is mild and stable.

## 2010-06-26 NOTE — Telephone Encounter (Signed)
Pt aware and verbalized understanding.  

## 2010-06-26 NOTE — Discharge Summary (Signed)
NAME:  [name] Luna, [name] Luna NO.:  [id number]  MEDICAL RECORD NO.:  [id number]           PATIENT TYPE:  I  LOCATION:  2002                         FACILITY:  [facility]  PHYSICIAN:  Elinor Kleine A. Alanda Amass, M.D.DATE OF BIRTH:  1921/03/13  DATE OF ADMISSION:  05/11/2010 DATE OF DISCHARGE:  05/24/2010                              DISCHARGE SUMMARY   DISCHARGE DIAGNOSES: 1. Atrial fibrillation.  Currently on amiodarone 200 mg p.o. daily,     metoprolol 150 mg twice daily. 2. Heart failure, acute-on-chronic diastolic. 3. Hypertension. 4. History of coronary artery disease. 5. Hypokalemia, repleted. 6. Upper esophageal sphincter stenosis, status post dilation of 14 mm     by Dr. Elnoria Howard on May 16, 2010. 7. Urinary tract infection, treated with ciprofloxacin.  HOSPITAL COURSE:  [name] Luna is an 77 year old Caucasian female with a history of atrial fibrillation, diastolic heart failure, coronary arterydisease.  She was just discharged on April 23, 2010 for combination of diastolic heart failure and atrial fibrillation.  She presented back to [facility] on May 11, 2010 after developing severe shortness of breath and left anterior chest pain.  EMS brought her to the emergency room at which time she was nauseated.  Family states that she also had complaints of weakness during the week prior.  She was seen by Dr. Alanda Amass after her discharge on April 23, 2010, at which time she was started on amiodarone 200 mg twice a day and started on Coumadin.  She also has a history of complete heart block and has a permanent transvenous Medtronic Versa pacemaker, which was implanted on October 2009.  She is currently in VVIR mode.  Echocardiogram from April 11, 2010 revealed ejection fraction of 50% to 55%.  She was admitted to Step- Down Unit, started on IV nitroglycerin.  Her INR was found to be 5.19. Her Coumadin was held until therapeutic ranges were realized.  Cardiac enzymes  were cycled.  Troponins were negative x6.  Urinalysis revealed positive nitrite, many bacteria.  [name] Luna was started on ciprofloxacin and also did an INR being supratherapeutic.  She was started and her stools were checked for blood and found to be guaiac-positive.  The subsequent GI consult was requested.  On May 12, 2010, [name] Luna stated she felt better.  No chest pain and her breathing was better.  On May 13, 2010, she became confused around 2 a.m.  Medications were reviewed.  No sedatives had been given.  Neuro exam shows she was grossly intact and she had realized she became confused.  Initial chest x-ray showed an enlargement of cardiac silhouette, post pacemaker. Resolution of pulmonary infiltrates and effusion since prior exam.  She was sent for head CT without contrast media created confusion.  There was no acute intracranial abnormality noted.  Stable chronic small vessel ischemic changes were noted.  Lexiscan Myoview was ordered. Results showed no evidence of ischemia or infarction.  Her ejection fraction was 67%.  [name] Luna also states that since being admitted to the hospital, she developed a tremor bilaterally in her hands.  X-ray was scheduled for TEE cardioversion on May 16, 2010.  After several attempts to insert the transducer and TEE cardioversion was cancelled. She was referred for EGD by Dr. Elnoria Howard.  EEG revealed esophageal sphincter stenosis.  She was dilated up to 4 mm.  In addition, no other upper GI pathology was identified to explain the anemia.  At that time, I was not advised to proceed with colonoscopy due to severe comorbidity.  INR continued to decrease.  She was restarted on her Coumadin with her INR reached to 1.79.  [name] Luna developed dysphagia and swallowing her pills and eating, she was subsequently set up for bedside swallow study and then modified barium swallow thereafter.  Speech pathology recommended dysphagia II diet with  nectar-thick liquids.  No straws.  On May 19, 2010, the patient complained of 5/10 chest pain in the last 30 minutes.  Chest pain was worse with inspiration.  Cardiac enzymes were checked x1.  EKG was checked and showed no acute changes.  Her diltiazem was increased to 240 mg daily.  Her son stated that he was concerned that she was not "normal."  The patient however seem to answer all questions appropriately and follow commands.  During rounds, the patient seemed to be alert and oriented.  The patient's ciprofloxacin was stopped on May 20, 2010, and the patient had new onset of confusion and there was concern that there may have added to it.  The patient was also switched from 200 mg p.o. amiodarone b.i.d. to once daily. Neurology consult was also requested and the CT of the head without contrast was repeated.  Impression was stable examination with chronic periventricular white matter disease.  No acute intracranial findings noted.  Ammonia levels were found to be normal.  ESR was 92.  TSH was 4.971, free T4 was checked and found to be 1.67.  EEG was also conducted and results are pending.  On May 22, 2010, the patient reported decreased tremors and some dyspnea on exertion now with overall improvement.  Blood pressure 137/70, pulse 79.  BNP remains ultimately stable in the 400s.  She was hypokalemic, which was repleted. Currently, [name] Luna is without complaints.  She reports much feeling much better, decreased hallucination that she was having.  Chest x-ray reports bilateral pleural effusions greater on the left than the right. Social worker has worked with [name] Luna to place her at [facility] for short-term skilled nursing rehab and she had been seen by Dr. Allyson Sabal, feels she is stable for discharge with follow-up in 1-2 weeks.  DISCHARGE LABS:  WBC 7.3, hemoglobin 9.2, hematocrit 29.0, platelets 236.  INR 2.18.  Sodium 145, potassium 4.0, chloride 109, carbon  dioxide 27, glucose 101, BUN 14, creatinine 0.81, calcium 8.7.  BNP 419, free T4 1.67.  TSH 4.890, hemoglobin A1c 5.9, mean plasma glucose 123, total cholesterol 126, triglycerides 74, HDL 40, LDL 71.  STUDIES/PROCEDURES:  Chest x-ray June 14, 2010 reveals bilateral pleural effusions identified, left greater than right; atelectasis noted within both lung bases.  CT of the head without contrast on May 21, 2010.  IMPRESSION:  Stable examination with chronic periventricular white matter disease.  No acute intracranial findings.  Lexiscan Myoview May 15, 2010 revealed no evidence of ischemia or infarction and an ejection fraction of 67%.  DISCHARGE MEDICATIONS: 1. Acetaminophen 325 mg 2 tablets by mouth daily every 4 hours as     needed. 2. Amiodarone 200 mg tablets 1 tablet by mouth daily. 3. Diltiazem 240 mg tablets 1 tablet by mouth daily. 4. Aspirin enteric-coated 2  tablets by mouth every morning. 5. Calcium carbonate 750 mg 1 tablet by mouth twice daily. 6. Claritin 10 mg 1 tablet by mouth daily. 7. Coumadin 5 mg one half tablet daily. 8. Furosemide 40 mg 1 tablet by mouth daily. 9. Levothyroxine 75 mcg 1 tablet by mouth every morning. 10.Metoprolol tartrate 100 mg tablets one and half tablet by mouth     twice daily. 11.Multivitamin 1 tablet by mouth every morning. 12.Nitroglycerin sublingual 0.4 mg 1 tablet on the tongue every 5     minutes as needed for chest pain with 3 doses. 13.Ocuvite 1 tablet by mouth twice daily. 14.Plavix 75 mg one tablet by mouth daily. 15.Potassium chloride 20 mEq 1 tablet by mouth daily. 16.Protonix 40 mg 2 tablets by mouth daily. 17.Simvastatin 20 mg 1 tablet by mouth daily at bedtime. 18.Valsartan 80 mg 1 tablet by mouth daily.  DISPOSITION:  [name] Luna will be discharged to [facility] for short- term skilled nursing rehab.  She will eat a heart-healthy low-sodium diet.  Increase her activity slowly.  She will follow up with  Dr. Alanda Amass in approximately 1-2 weeks.  The patient will be on Coumadin for at least 4 subsequent weeks and then have a cardioversion as she is still in atrial fibrillation.  She will also follow up for INR check on Friday, May 26, 2010, at [facility].    ______________________________ Wilburt Finlay, PA   ______________________________ Pearletha Furl. Alanda Amass, M.D.    Jane Canary  D:  05/24/2010  T:  05/24/2010  Job:  [id number]  cc:   Gerlene Burdock A. Alanda Amass, M.D.  Electronically Signed by Wilburt Finlay PA on 05/31/2010 04:08:18 PM Electronically Signed by Susa Griffins M.D. on 06/26/2010 01:36:43 PM

## 2010-07-04 NOTE — Consult Note (Signed)
NAME:  [name] Luna, [name] Luna NO.:  [id number]  MEDICAL RECORD NO.:  [id number]           PATIENT TYPE:  I  LOCATION:  2927                         FACILITY:  [facility]  PHYSICIAN:  Jordan Hawks. Elnoria Howard, MD    DATE OF BIRTH:  05-26-1920  DATE OF CONSULTATION:  05/12/2010 DATE OF DISCHARGE:                                CONSULTATION   REASON FOR CONSULTATION:  Heme-positive stool and anemia.  CARDIOLOGIST:  Nanetta Batty, MD  HISTORY OF PRESENT ILLNESS:  This is an 77 year old female with past medical history of coronary artery disease status post stent placement, history of diastolic heart failure, and atrial fibrillation who was admitted to the hospital with complaints of shortness of breath.  Her shortness of breath coupled with chest pain.  Subsequently, she was admitted to the hospital for further evaluation and treatment.  During the workup, she was noted to be made negative for any cardiac etiology. In the past she had issues with noncardiac chest pain felt to be secondary to gastroesophageal reflux disease.  Apparently it did improve with the use of Nexium in the past.  An EGD was performed by Dr. Elnoria Howard in the past and it was negative for any overt findings.  Additionally, the patient reports having colonoscopy by Dr. Loreta Ave number of years ago and she believes that it was normal.  During this hospitalization, her hemoglobin was noted to have dropped and subsequently she was found to be heme-positive.  Her current INR is in 4 range for her paroxysmal atrial fibrillation.  As a result of these findings, GI consultation was requested.  PAST MEDICAL HISTORY AND PAST SURGICAL HISTORY:  As stated above.  She is also status post complete heart block with permanent transvenous pacemaker placement in October 2009, also hyperlipidemia.  FAMILY HISTORY:  Noncontributory.  SOCIAL HISTORY:  Negative for alcohol, tobacco, or illicit drug use obese.  ALLERGIES:  PENICILLIN,  CODEINE, PREDNISONE, ISOSORBIDE MONONITRATE, VALIUM, and ATIVAN.  MEDICATIONS: 1. Amiodarone. 2. Aspirin. 3. Calcium. 4. Plavix. 5. Synthroid. 6. Claritin. 7. Metoprolol. 8. Multivitamins. 9. Benicar. 10.Protonix. 11.Zocor. 12.Warfarin protocol. 13.Xanax. 14.Heparin protocol. 15.Maalox. 16.Nitroglycerin.  PHYSICAL EXAMINATION:  VITAL SIGNS:  Blood pressure is 122/62, heart rate is 80, pulse ox is 97%. GENERAL:  The patient is in no acute distress, alert and oriented. HEENT:  Normocephalic, atraumatic.  Extraocular muscles intact. NECK:  Supple.  No lymphadenopathy. LUNGS:  Clear to auscultation bilaterally. CARDIOVASCULAR:  Irregularly irregular. ABDOMEN:  Flat, soft, nontender, nondistended. EXTREMITIES:  No clubbing, cyanosis, or edema.  LABORATORY VALUES:  White blood cell count 6.9, hemoglobin 10.5, MCV 88.0, platelets at 183,000, INR 4.9.  Sodium 145, potassium 3.8, chloride 108, CO2 29, glucose 99, BUN 25, creatinine 1.0.  IMPRESSION: 1. Anemia. 2. Heme-positive stool. 3. Paroxysmal atrial fibrillation. 4. Chest pain, questionable noncardiac. 5. Congestive heart failure.  After evaluation, the patient believes     her anemia is most likely anemia of chronic disease, although she     is heme-positive at this time.  In the past, she had undergone an     EGD and it was essentially negative.  Her current  INR is elevated.     She denies having any hematochezia or melena.  No complaints of any     abdominal pain.  However, with the current findings, it is not     unreasonable to perform an EGD.  If the EGD is negative, a lengthy     discussion will be required about pursuing a colonoscopy given her     current medical comorbidities.  Plan is to perform an EGD tomorrow     and further recommendations pending the findings.     Jordan Hawks Elnoria Howard, MD     PDH/MEDQ  D:  05/12/2010  T:  05/13/2010  Job:  [id number]  cc:   Nanetta Batty, M.D.  Electronically Signed  by Jeani Hawking MD on 07/04/2010 08:51:35 AM

## 2010-07-11 ENCOUNTER — Ambulatory Visit (HOSPITAL_COMMUNITY)
Admission: RE | Admit: 2010-07-11 | Discharge: 2010-07-11 | Disposition: A | Discharge status: Home / Self Care | Payer: Medicare Other | Source: Ambulatory Visit | Attending: Cardiology | Admitting: Cardiology

## 2010-07-11 DIAGNOSIS — I4891 Unspecified atrial fibrillation: Secondary | ICD-10-CM | POA: Insufficient documentation

## 2010-07-11 DIAGNOSIS — E876 Hypokalemia: Secondary | ICD-10-CM | POA: Insufficient documentation

## 2010-07-11 DIAGNOSIS — Z7982 Long term (current) use of aspirin: Secondary | ICD-10-CM | POA: Insufficient documentation

## 2010-07-11 DIAGNOSIS — I2510 Atherosclerotic heart disease of native coronary artery without angina pectoris: Secondary | ICD-10-CM | POA: Insufficient documentation

## 2010-07-11 DIAGNOSIS — Z7902 Long term (current) use of antithrombotics/antiplatelets: Secondary | ICD-10-CM | POA: Insufficient documentation

## 2010-07-11 DIAGNOSIS — I10 Essential (primary) hypertension: Secondary | ICD-10-CM | POA: Insufficient documentation

## 2010-07-11 DIAGNOSIS — Z79899 Other long term (current) drug therapy: Secondary | ICD-10-CM | POA: Insufficient documentation

## 2010-07-11 DIAGNOSIS — Z950 Presence of cardiac pacemaker: Secondary | ICD-10-CM | POA: Insufficient documentation

## 2010-07-11 DIAGNOSIS — I509 Heart failure, unspecified: Secondary | ICD-10-CM | POA: Insufficient documentation

## 2010-07-11 HISTORY — PX: CARDIOVERSION: SHX1299

## 2010-07-11 LAB — PROTIME-INR: INR: 2.33 — ABNORMAL HIGH (ref 0.00–1.49)

## 2010-07-19 NOTE — Cardiovascular Report (Signed)
  NAME:  [name] Luna, [name] Luna NO.:  [id number]  MEDICAL RECORD NO.:  [id number]           PATIENT TYPE:  O  LOCATION:  [facility]                         FACILITY:  [facility]  PHYSICIAN:  Thereasa Solo. Little, M.D. DATE OF BIRTH:  1921/01/07  DATE OF PROCEDURE:  07/11/2010 DATE OF DISCHARGE:  07/11/2010                           CARDIAC CATHETERIZATION   PROCEDURE:  Cardioversion.  OPERATOR:  Thereasa Solo. Little, MD  This 77 year old female has brady-tachy syndrome with a permanent pacemaker.  She has been in atrial fibrillation.  She has an INR of 2.3. Her ejection fraction is 50-55% and her left atrial dimension by echo on April 11, 2010, was 4.3 cm.  After receiving 70 mg of IV Ditropan through anesthesia, the patient was electively cardioverted using anterior and posterior pads.  A 120-watt seconds resulted in the patient going from an atrial fib rate to A paced, V paced rhythm verified via the pacemaker.  She will be discharged home later today and follow up with Dr. Alanda Amass.          ______________________________ Thereasa Solo. Little, M.D.     ABL/MEDQ  D:  07/11/2010  T:  07/12/2010  Job:  [id number]  cc:   Tinnie Gens A. Tawanna Cooler, MD  Electronically Signed by Julieanne Manson M.D. on 07/19/2010 08:24:32 AM

## 2010-08-15 NOTE — Cardiovascular Report (Signed)
NAMEMarland Kitchen  DEVA, RON NO.:  [id number]   MEDICAL RECORD NO.:  [id number]          PATIENT TYPE:  INP   LOCATION:  2908                         FACILITY:  [facility]   PHYSICIAN:  Antonieta Iba, MD   DATE OF BIRTH:  [birth year]   DATE OF PROCEDURE:  DATE OF DISCHARGE:                            CARDIAC CATHETERIZATION   PROCEDURES PERFORMED:  Coronary angiogram.   PHYSICIAN:  Antonieta Iba, MD   REFERRING PHYSICIAN:  Richard A. Alanda Amass, MD   HISTORY:  Ms. Ivy is a very pleasant 77 year old woman with a past  medical history of PCI to the LAD in July 2008, which stent to her  proximal LAD with DES Cypher stent.  Recatheterization in August 2008,  showed the stent to be widely patent with a normal circumflex and no  significant disease noted.  She had a normal dominant right coronary.   Ms. Mangine presented to [facility] after an episode of  substernal chest pain on July 23, 2007.  She had nausea, diaphoresis,  shortness of breath with pain radiating to her shoulders and back.  She  had partial relief by nitroglycerin given by paramedics and was placed  on IV nitroglycerin.  Her CK on arrival was mildly elevated at 264 with  CK-MB of 10, troponin of 0.03, which was similar to initial her point-of-  care troponin 0.05.  She was kept on heparin and nitroglycerin overnight  and after evaluation by Dr. Nanetta Batty, it was felt that a cardiac  catheterization was indicated, given her history of coronary disease and  her presenting symptoms.   PROCEDURE IN DETAIL:  After detailed description of the procedure,  including the risks, benefits, and potential, alternatives.  Informed  consent was obtained.  The patient was brought to the cardiac  catheterization lab and prepped and draped in usual sterile fashion.   Local anesthesia was obtained using 1% lidocaine.  The right femoral  artery was cannulated using a modified Seldinger technique and a 5-  French introducer sheath was inserted.   Selective coronary angiography was then performed using a 5-French  Judkins left #4 catheter to engage the left main coronary artery and a 5-  French Judkins right #4 catheter to engage the right coronary artery.  An AL1 catheter was exchanged for the 5-French Judkins right #4 catheter  to engage the right coronary artery.  Angiography was performed using  hand injections of contrasts in multiple projections.  Left  ventriculography was not performed due to the contrast load that the  patient received trying to cannulate the right coronary artery.  Following a procedure, the arterial sheath was removed and hemostasis  obtained.  Total of 100 mL of contrast was used.  There were no  complications.   Angiography;  1. Left main coronary artery;  the left main coronary artery is a      moderate-sized vessel that bifurcates into the left anterior      descending and left circumflex.  The artery has no significant      disease.   1. Left anterior descending;  The left  anterior descending is a      moderate-sized vessel that extend to the apex.  It gives rise to 3      diagonal arteries.  There was a stent in the left anterior      descending after the second diagonal that is patent with no      significant instant restenosis.  The D2 vessel has 30-40% ostial      disease.  Otherwise, there is no significant disease in the      remainder of the diagonals and the mid-distal left anterior      descending.   1. Left circumflex;  The left circumflex is a nondominant moderate-      sized vessel that gives rise to 2 obtuse marginal vessels that are      moderate in size.  There is no significant disease noted.   1. Right coronary artery;  The right coronary artery is a dominant      vessel that gives rise to large posterior descending artery and      posterolateral branch.  There is no significant disease noted.  Off      note, there is an anterior take  off with apparent calcium near the      ostial region that will help identify the location of this vessel.      The AL1was able to engage the vessel where as JR4 was not able.   CONCLUSIONS:  Right dominant coronary system with a patent stent in the  proximal-to-mid LAD after the diagonal #2.  There was 30-40% ostial D2  disease, but otherwise no significant disease noted.  The etiology for  her chest pain is uncertain, and given her cardiac markers are not  elevated and there is no significant EKG changes and given that there is  no significant changes to her coronary anatomy, it is likely that her  chest pain is noncardiac versus microvascular disease.      Antonieta Iba, MD  Electronically Signed     TJG/MEDQ  D:  07/24/2007  T:  07/25/2007  Job:  [id number]

## 2010-08-15 NOTE — Consult Note (Signed)
NAME:  [name] Luna, CARADONNA NO.:  [id number]   MEDICAL RECORD NO.:  [id number]          PATIENT TYPE:  INP   LOCATION:  3703                         FACILITY:  [facility]   PHYSICIAN:  Jordan Hawks. Elnoria Howard, MD    DATE OF BIRTH:  March 02, 1921   DATE OF CONSULTATION:  11/15/2006  DATE OF DISCHARGE:  11/15/2006                                 CONSULTATION   REASON FOR CONSULTATION:  Epigastric pain.   REFERRING PHYSICIAN:  Nanetta Batty, M.D.   HISTORY OF PRESENT ILLNESS:  This is a an 77 year old female who was  admitted to the emergency room after complaints of syncope. Apparently  the patient was prescribed Imdur.  She took medication and had a  subsequent reaction. The patient was then readmitted back to the  hospital for further evaluation and treatment.  Recently she was  admitted to the hospital for complaints of chest pain which was negative  for any stent occlusion.  The cardiac enzymes were not elevated.  The  patient was recently seen in back in the office 1 week ago with  complaints of an epigastric pain that started acutely 4 days prior to  the office visit. The worst episode was at the time of onset; however,  the subsequent evening she did experience these types of discomfort. In  the past, the patient was noted to have noncardiac chest pain and was  treated successfully with Nexium in September. Until this time, she was  prescribed sucralfate in the office.  Unfortunately she only took one  dose prior to her complaints of chest pain and being readmitted back to  the hospital for evaluation and treatment as described above. The plan  on an outpatient basis was to perform an EGD if the patient did not  respond to the medication.   PAST MEDICAL HISTORY:  Significant for:  1. Coronary artery disease.  2. Complete heart block status post a pacemaker in 2000.  3. PSVT.  4. Hypertension.  5. Hyperlipidemia.  6. Hypothyroidism.  7. Peripheral vascular disease.  8.  COPD.  9. Macular degeneration.  10.History of a fall with head injury.  11.Gastroesophageal reflux disease.   PAST SURGICAL HISTORY:  None.   ALLERGIES:  PREDNISONE, CODEINE, PENICILLIN, ATIVAN.   MEDICATIONS:  Aspirin, heparin, hydrochlorothiazide, Isoptin, K-Dur,  Plavix, Prinivil, Lovenox, Synthroid, Toprol, Zocor, Zofran, and Xanax.   REVIEW OF SYSTEMS:  Is negative per the 11-point Review of Systems and  also as stated above in History of Present Illness.   FAMILY HISTORY:  Noncontributory.   SOCIAL HISTORY:  The patient is married with two children.  No alcohol,  tobacco or illicit drug use.   PHYSICAL EXAMINATION:  VITAL SIGNS:  Blood pressure is 150/71, heart  rate 71, respirations 19, temperature is 97.4.  GENERAL:  The patient is in no acute distress, alert, oriented.  HEENT:  Normocephalic, atraumatic.  Extraocular muscles intact.  NECK:  Supple.  No lymphadenopathy.  LUNGS:  Clear to auscultation bilaterally.  CARDIOVASCULAR:  Regular rate and rhythm.  ABDOMEN:  Flat, soft, nontender, nondistended.  EXTREMITIES:  No clubbing, cyanosis or  edema.   LABORATORY VALUES:  White blood cell count of 6.5, hemoglobin 10.9,  platelets at 166.  Sodium 141, potassium 3.7, chloride 110, CO2 26, BUN  6, creatinine 0.6, glucose 82.   IMPRESSION:  1. Noncardiac chest pain.  2. Multiple medical problems.   After evaluation of the patient with regards to her persistent  abdominal, epigastric, and chest pain, it is not unreasonable perform  EGD as probably this is the next step. She did have some treatment with  Nexium.   PLAN:  The plan will be to perform the procedure while she is on Nexium  and to assess the pH level.  Depending on the results, the patient may  require a manometry determine if she has a hypertensive esophagus.      Jordan Hawks Elnoria Howard, MD  Electronically Signed     PDH/MEDQ  D:  11/18/2006  T:  11/18/2006  Job:  [id number]   cc:   Nanetta Batty, M.D.

## 2010-08-15 NOTE — Discharge Summary (Signed)
[name] Luna, [name] Luna              ACCOUNT NO.:  [id number]   MEDICAL RECORD NO.:  [id number]          PATIENT TYPE:  INP   LOCATION:  4734                         FACILITY:  [facility]   PHYSICIAN:  Valerie A. Felicity Coyer, MDDATE OF BIRTH:  Apr 29, 75 1922   DATE OF ADMISSION:  11/15/2006  DATE OF DISCHARGE:  11/20/2006                               DISCHARGE SUMMARY   DISCHARGE DIAGNOSES:  1. Recurrent syncope secondary to Imdur.  2. Anemia, status post esophagogastroduodenoscopy performed on June 19, 2006, with Bravo placement.  Findings consistent with      gastroesophageal reflux disease and likely small hiatal hernia.   HISTORY OF PRESENT ILLNESS:  [name] Luna is an 77 year old, white female  who was admitted on November 15, 2006, status post syncopal episode on the  day of admission.  She apparently had a loss of consciousness which  lasted approximately 60 seconds around dinner time.  This occurred while  she was seated having dinner.  She reports feeling some sort of  epigastric or low sternal area discomfort and feeling funny all over  just prior.  The episode was witnessed by her son.  She slumped over in  the chair, but had no injury.  She was started on Imdur on the day of on  that day just prior to discharge.  She was admitted for further  evaluation and treatment.   PAST MEDICAL HISTORY:  1. History of coronary artery disease, status post catheterization on      November 14, 2006, with patent PCI and stent to the LAD lesion.  2. Acute coronary syndrome on October 11, 2006, with question of basal      spasm given her negative catheterization.  3. History of complete heart block, status post pacemaker in 2000.  4. Known paroxysmal SVT.  5. Hypertension.  6. Hypercholesterolemia.  7. Hypothyroidism.  8. Peripheral vascular disease, mild carotid, less than 50%      bilaterally in 2007.  9. Chronic obstructive pulmonary disease.  10.Macular degeneration.  11.History of  hallucinations.  12.History of fall with head injury.  13.GERD.   HOSPITAL COURSE:  Problem 1.  RECURRENT SYNCOPE:  The patient was  admitted.  Her Imdur was discontinued.  She did have one episode with  mild nausea and a near syncopal event during this admission, otherwise,  she has had no further symptoms.  Her vitals are currently stable.  Per  cardiology request, they are requesting a physical therapy evaluation  prior to discharge to home.  I did discuss with the patient the  possibility of assisted-living which she is not interested in at this  time.  She wishes to discharge to home.  She lives with her husband.   Problem 2.  ANEMIA:  The patient was seen by [facility] during this  admission and underwent an EGD with Bravo placement for pH monitoring.  Plan for outpatient followup.  She will be continued on a proton pump  inhibitor.   The patient was noted to have a right groin hematoma, status post  catheterization last week.  She underwent  a right groin ultrasound which  was negative for pseudoaneurysm, however, hematoma was visualized.   DISCHARGE LABORATORY DATA AND X-RAY FINDINGS:  Discharge hemoglobin 9.9,  hematocrit 29.6.  BUN 6, creatinine 0.65.   DISCHARGE MEDICATIONS:  1. Toprol XL 100 mg p.o. daily.  2. Verapamil 240 mg p.o. daily.  3. Aspirin 81 mg p.o. daily.  4. K-Dur 40 mEq p.o. daily.  5. Nexium 40 mg p.o. daily.  6. Synthroid 50 mcg p.o. daily.  7. Hydrochlorothiazide 12.5 mg p.o. daily.  8. Plavix 75 mg daily.  9. Crestor 10 mg p.o. daily.  10.Zestril 10 mg p.o. daily.   FOLLOW UP:  The patient is instructed to follow up with Dr. Kelle Darting  on November 28, 2006, at 10:15 a.m.  Follow up with Dr. Jeani Hawking as an  outpatient.      Sandford Craze, NP      Raenette Rover. Felicity Coyer, MD  Electronically Signed    MO/MEDQ  D:  11/20/2006  T:  11/21/2006  Job:  [id number]   cc:   Tinnie Gens A. Tawanna Cooler, MD  Jordan Hawks Elnoria Howard, MD

## 2010-08-15 NOTE — H&P (Signed)
NAME:  PEGGYE, POON NO.:  [id number]   MEDICAL RECORD NO.:  [id number]          PATIENT TYPE:  INP   LOCATION:  2918                         FACILITY:  [facility]   PHYSICIAN:  Richard A. Alanda Amass, M.D.DATE OF BIRTH:  1920/09/15   DATE OF ADMISSION:  10/11/2006  DATE OF DISCHARGE:                              HISTORY & PHYSICAL   CHIEF COMPLAINT:  Chest pain.   HISTORY OF PRESENT ILLNESS:  Ms. Dickison is an 77 year old female followed  by Dr. Tawanna Cooler and Dr. Alanda Amass with a history of mild coronary disease  and complete heart block.  She underwent permanent pacemaker with a  Medtronic device in October of 2000.  She had a catheterization in  September of 2000, which showed a 60% LAD.  Her last office visit with  Dr. Alanda Amass was May of 2008.  She had been doing well at that time.  Dr. Alanda Amass did feel that she was having some episodes of PSVT.  The  patient did well since her last office visit until about 12 a.m. today.  She said she was awakened with a sharp, midsternal chest pain that  radiated to her back and left arm.  This was associated with some  shortness of breath and mild diaphoresis and palpitations.  She took  nitroglycerin a couple of times through the night with partial relief.  She says she had symptoms off and on all night.  She called the office  this morning and was referred to the emergency room.  Currently, she is  pain free.   PAST MEDICAL HISTORY:  Her past medical history is remarkable for  treated hypertension, treated hypothyroidism, mild peripheral vascular  disease with 0-49% bilateral internal carotid artery stenosis by  Dopplers in 2007, dyslipidemia, TR by echo with an ejection fraction of  45-50% in November of 2007.  She has had no other major surgeries.   CURRENT MEDICATIONS:  1. Metoprolol 100 mg b.i.d.  2. Calan 120 mg a day.  3. Crestor 10 mg a day.  4. Aspirin 81 mg a day.  5. Synthroid 0.05 mg a day.  6. Zestril 10 mg a  day.  7. Hydrochlorothiazide 12.5 mg a day.  8. Nexium 40 mg a day.   ALLERGIES:  SHE IS ALLERGIC TO CODEINE, PENICILLIN, PREDNISONE AND  ATIVAN.   SOCIAL HISTORY:  She is married with two children, four grandchildren,  five great-grandchildren.  She never smoked.   FAMILY HISTORY:  Unremarkable for coronary disease.   REVIEW OF SYSTEMS:  Essentially unremarkable, except for noted above.  She denies any history of sustained tachycardia.  She has had some  palpitations.  She denies any syncope.  She has not had GI bleeding or  melena.  She has never had bleeding trouble.   PHYSICAL EXAMINATION:  Blood pressure 184/76, pulse 70, temperature 97,  room air sat 96%.  In general, she is a well-developed, petite elderly  female in no acute distress.  HEENT is normocephalic.  She does wear  glasses.  Neck is without JVD or bruit.  Chest is clear to auscultation  and percussion.  Cardiac exam reveals regular rate and rhythm with a soft  systolic murmur at the left sternal border, normal S1 and S2, with  frequent extrasystoles.  Abdomen is nontender.  Bowel sounds are  present.  Extremities are without edema.  Distal pulses are 2+/4.  Neuro  exam is grossly intact.  She is awake, alert and oriented and  cooperative, moves all extremities without obvious deficit.  Skin is  warm and dry.   EKG shows sinus rhythm with PVCs and paced beats.   IMPRESSION:  1. Unstable angina, possibly secondary to paroxysmal supraventricular      tachycardia.  2. Mild coronary disease with a 60% LAD in September of 2007.  3. Medtronic pacemaker implant in October of 2000 for complete heart      block.  4. Suspected paroxysmal supraventricular tachycardia.  5. Treated hypertension.  6. Treated hypothyroidism.  7. Treated dyslipidemia.  8. Mild peripheral vascular disease.   PLAN:  The patient will be admitted to telemetry.  She is started on  heparin and nitrates.  Will rule out MI.      Abelino Derrick, P.A.      Richard A. Alanda Amass, M.D.  Electronically Signed    LKK/MEDQ  D:  10/11/2006  T:  10/11/2006  Job:  [id number]

## 2010-08-15 NOTE — Cardiovascular Report (Signed)
NAME:  [name] Luna, [name] Luna NO.:  [id number]   MEDICAL RECORD NO.:  [id number]          PATIENT TYPE:  INP   LOCATION:  3703                         FACILITY:  [facility]   PHYSICIAN:  Antonieta Iba, MD   DATE OF BIRTH:  09-18-1920   DATE OF PROCEDURE:  11/14/2006  DATE OF DISCHARGE:                            CARDIAC CATHETERIZATION   CARDIOLOGIST:  Nanetta Batty of [facility]   PROCEDURE PERFORMED:  Coronary angiogram of native vessels.   PHYSICIANS:  1. Dossie Arbour, M.D., Ph.D., [facility]  2. Nanetta Batty, M.D., [facility]   HISTORY:  This is a very pleasant 77 year old woman with a past medical  history of coronary artery disease, status post PCI of her proximal LAD  on October 14, 2006 that presents with several episodes of chest pain at  stress and rest.  Given her recent intervention and symptoms suspicious  for unstable angina, she was taken to the cardiac catheterization lab  for further evaluation.   MEDICATIONS GIVEN:  1. Versed 1 mg IV pushed.  2. Fentanyl 25 mcg IV pushed.   PROCEDURE AND DETAILS:  After detailed description of the procedure,  including the risks, benefits and potential alternatives, the informed  consent was obtained.  The patient was premedicated with Valium 5 mg  p.o. and Benadryl 25 mg p.o. and brought to the cardiac catheterization  lab.  Patient was prepped and draped in the usual sterile fashion.   Local anesthesia was obtained using 1% lidocaine.  The right femoral  artery was cannulated using a modified Seldinger technique and a 6-  French introducer sheath was inserted.   Selective coronary angiography was then performed using a 6-French  Judkins left #4 catheter to engage the left main coronary artery.  A 6-  French Judkins right #4 catheter was used in an attempt to engage the  right coronary artery, though this was unsuccessful.  An AL1  catheter  was used to successfully engage the right coronary artery.  Arteriography was then performed using hand injections of contrast in  multiple projections.  Left ventriculogram was not performed.  Following  the procedure, the arterial sheath was removed and pressure held until  hemostasis was obtained.  There were no complications.   FINDINGS:  Blood pressure 158/63 during the case, on arrival to the cath  lab, systolic pressures were in the 170s.   ANGIOGRAPHY:  1. Left main coronary artery:  The left main coronary artery is a      moderate-size vessel that bifurcates into the LAD and left      circumflex.  The artery has no significant disease.  2. The left anterior descending:  The LAD is a moderate-size vessel      that gives rise to three diagonal arteries.  It extends to the      apex.  There is a stent after the second diagonal that is patent      with no significant in-stent restenosis.  There is very mild mid-to-      distal LAD disease that  is diffuse, though nothing significant.      The D1, D2 and D3 have no significant disease.  3. Left circumflex:  The left circumflex is a nondominant moderate-      size vessel that gives rise to one branching obtuse marginal      vessel.  There is no significant disease.  The obtuse marginal      vessel is moderate size and no significant disease.  4. Right coronary artery:  The right coronary artery is a dominant      vessel with no significant disease.  The PDA and PL also have no      significant disease.   CONCLUSIONS:  Status post PCI of the proximal LAD with a widely patent  stent, no significant in-stent restenosis.  No significant disease noted  other than some very mild plaquing.  Etiology of her chest pain is  likely secondary to coronary artery disease, though perhaps may be  secondary to her supraventricular tachycardia, which she was noted to  have several (12) runs of SVT noted on her pacemaker.  She will be   restarted on her verapamil for her symptoms.      Antonieta Iba, MD  Electronically Signed     TJG/MEDQ  D:  11/14/2006  T:  11/14/2006  Job:  [id number]   cc:   Nanetta Batty, M.D.

## 2010-08-15 NOTE — Discharge Summary (Signed)
NAMEMarland Luna  Luna, [name] NO.:  [id number]   MEDICAL RECORD NO.:  [id number]          PATIENT TYPE:  INP   LOCATION:  2908                         FACILITY:  [facility]   PHYSICIAN:  Abelino Derrick, P.A.   DATE OF BIRTH:  December 14, 1920   DATE OF ADMISSION:  07/23/2007  DATE OF DISCHARGE:  07/25/2007                               DISCHARGE SUMMARY   DISCHARGE DIAGNOSES:  1. Chest pain worrisome for unstable angina, patent left anterior      descending Cypher stent by catheterization on this admission.  2. Known coronary disease with left anterior descending cutting      balloon and Cypher stenting in July 2008.  3. History of complete heart block, status post permanent pacemaker      implant in the past.  4. Treated hypertension.  5. Premature ventricular contraction.  6. Macular degeneration.  7. Treated hypothyroidism.   HOSPITAL COURSE:  [name] Luna is a 77 year old female followed by Dr.  Alanda Amass with a history of coronary artery disease.  She had a LAD,  cutting balloon atherectomy, and Cypher stent in July 2008.  She had  complete heart block and underwent a pacemaker in November 2007.  She  has an EF of 45-50%.  In September 2008, she presented with syncope felt  secondary to Imdur.  At that time, she was cathed and had a widely  patent LAD stent.  She was seen by Dr. Alanda Amass on July 22, 2007 for  routine office visit.  She had been doing well.  She presented on July 23, 2007 with substernal chest pain.  This was worrisome for unstable  angina.  She was admitted to telemetry, and started on IV heparin.  D-  dimer was 1.76, CT scan was negative for pulmonary embolism.  Troponins  were negative.  She underwent diagnostic catheterization on July 24, 2007 which again revealed a patent LAD stent.  The circumflex OM and RCA  were also normal.  LV gram was not performed.  We feel she can be  discharged on July 25, 2007.  She has an echo to be scheduled to be  done  as an outpatient in next week, and she will keep this appointment.   DISCHARGE MEDICATIONS:  1. Aspirin 81 mg a day.  2. Metoprolol 100 mg twice a day.  3. Nexium 40 mg a day.  4. Nitroglycerin sublingual p.r.n.  5. Plavix 75 mg a day.  6. Synthroid 0.05 mg a day.  7. Verapamil 240 mg a day.  8. Prednisone, eye drops as taken at home.   LABS:  The telemetry is AV paced, white count 7.2, hemoglobin 11.2,  hematocrit 32.7, platelets 157, sodium 141, potassium 3.2, BUN 6,  creatinine 0.6, cholesterol 151, LDL 99, HDL 44, triglycerides 42, CKs  were slightly elevated 264 with 10 MBs with a troponin negative, TSH is  5.94.  D-dimers 1.76.  CT showed no pulmonary embolism with  cardiomegaly.  INR is 1.0.   DISPOSITION:  The patient is discharged in stable condition, and will  keep appointment for her echocardiogram on the  first.  She will follow  up with Dr. Alanda Amass.      Abelino Derrick, P.ALenard Lance  D:  07/25/2007  T:  07/26/2007  Job:  [id number]

## 2010-08-15 NOTE — Discharge Summary (Signed)
NAMEMarland Kitchen  RAY, GLACKEN NO.:  [id number]   MEDICAL RECORD NO.:  [id number]          PATIENT TYPE:  INP   LOCATION:  4734                         FACILITY:  [facility]   PHYSICIAN:  Corwin Levins, MD      DATE OF BIRTH:  1921/01/16   DATE OF ADMISSION:  11/15/2006  DATE OF DISCHARGE:                               DISCHARGE SUMMARY   ADDENDUM:  The dictated discharge summary number was [phone].   DISCHARGE MEDICATIONS:  1. Potassium chloride 40 mEq every day.  2. Verapamil 240 mg every day.  3. Metoprolol 100 mg every day.  4. Nexium 40 mg every day.  5. Synthroid 50 mcg every day.  6. Hydrochlorothiazide 12.5 mg daily.  7. Plavix 75 mg a day.  8. Crestor 10 mg a day.  9. Zestril 10 mg twice daily.  10.Aspirin 81 mg a day.  11.Imdur 30 mg a day.   Her potassium was increased because on the day of discharge her  potassium was low at 3.2.      Lezlie Octave, N.P.      Corwin Levins, MD  Electronically Signed    BB/MEDQ  D:  11/15/2006  T:  11/16/2006  Job:  [id number]   cc:   Gerlene Burdock A. Alanda Amass, M.D.  Henriette Combs, MD

## 2010-08-15 NOTE — Cardiovascular Report (Signed)
NAME:  [name] Luna, [name] Luna NO.:  [id number]   MEDICAL RECORD NO.:  [id number]          PATIENT TYPE:  INP   LOCATION:  2807                         FACILITY:  [facility]   PHYSICIAN:  Nicki Guadalajara, M.D.     DATE OF BIRTH:  [birth year]   DATE OF PROCEDURE:  10/14/2006  DATE OF DISCHARGE:                            CARDIAC CATHETERIZATION   PROCEDURES:  Cardiac catheterization and percutaneous coronary  intervention.   INDICATIONS:  [name] is a very pleasant 77 year old female  patient of Drs. Alanda Amass and Tawanna Cooler.  She has a history of heart block  and is status post permanent pacemaker in October 2000.  She also has  had episodes of PSVT as well as some chest pain.  Her last cardiac  catheterization was done in September 2007 which showed eccentric 60-70%  proximal LAD stenosis after diagonal septal perforating artery.  She had  mild 10-20% narrowings and a dominant right coronary artery.  At that  time, she had normal LV function.  She had done well on medical therapy.  However, she was admitted to [facility] on July 11 with chest  pain very worrisome for unstable angina.  The previous night, she had  been awakened with substernal chest tightness and pressure which lasted  numerous hours.  Cardiac enzymes were very mildly positive with a CK  281, MB 10, relative index 3.8.  Troponin was 0.05.  She was treated  with heparin and nitroglycerin.  She is now referred for repeat  catheterization.   PROCEDURE:  After premedication with Valium, initially 3 mg  intravenously, the patient was prepped and draped in usual fashion.  Her  right femoral artery was punctured anteriorly, and a 5-French sheath was  inserted.  Diagnostic catheterization was done utilizing 5-French  Judkins 4 left coronary catheter as well as right catheter and an AL-1  catheter; 200 mcg intracoronary nitroglycerin was also selectively  administered down the left coronary circulation to  further elucidate the  proximal LAD stenosis to make certain this was not a component of spasm.  Biplane cine ventriculography was done utilizing 5-French pigtail  catheter.  With the demonstration of progressive proximal LAD disease  with narrowing up to 80% in this eccentric segment, although smooth, and  with the patient's recent unstable angina symptomatology with nocturnal  angina and mild CPK-MB positivity, percutaneous coronary intervention  was performed.  The sheath was upgraded to a 6-French system.  Angiomax  was used for anticoagulation, and 300 mg oral Plavix was administered.  An FL-4 guide 6-French was used for the intervention.  A Prowater wire  was advanced down the LAD.  Several additional doses of intracoronary  nitroglycerin were administered, and, due to the patient's hypertension,  her IV nitroglycerin dose was titrated up to 50 mcg.  Initial attempt at  primary stenting was done, but due to the tortuosity of the vessel and  the stenosis, the stent was not able to cross the lesion.  Predilatation  was then done utilizing, and due to the eccentricity of the lesion, a  3.0 x 6 mm cutting balloon  was then inserted.  Several cuts were made up  to 3 atmospheres.  Cutting balloon was then removed, and a 3.5 x 13 mm  Cypher stent was then successfully deployed and inflated x2 up to 14  atmospheres.  Post stent dilatation was done utilizing a 3.75 x 12 mm  Quantum balloon with a maximal dilatation up to 3.71 mm.  Scout  angiography confirmed an excellent angiographic result.  There was brisk  TIMI=3 flow.  There was no evidence for dissection.   HEMODYNAMIC DATA:  Central aortic pressure was 175/84.  Left ventricle  pressure was 175, 04/05.   ANGIOGRAPHIC DATA:  Left main coronary was angiographically normal and  bifurcated into an LAD and left circumflex system.   The LAD now had 80% stenosis eccentrically that was smooth in contour  just after the takeoff of the septal  perforating artery proximally.  Following intracoronary nitroglycerin,  this did improve slightly but  remained stenotic.   The circumflex vessel was angiographically normal.   The right coronary had mild 10% narrowing proximal to the crux.   Biplane cine ventriculograph revealed normal global contractility.  However, there was now a suggestion of upper to mid anterolateral  hypocontractility on the LAO projection.  Septal contractility was  normal on the LAO projection.   Following successful percutaneous coronary intervention with cutting  balloon arthrotomy and stenting of the proximal LAD, the 80% stenosis  was reduced to 0%.  A 3.5 x 13 mm Cypher stent was inserted and  ultimately postdilated to 3.71 mm.   IMPRESSION:  1. Normal left ventricular function with evidence for subtle upper mid      anterolateral hypocontractility which is new compared to the      previous study of September 2007 and compatible with the patient's      mild CPK enzyme bump and unstable angina symptomatology.  2. Progressive a 80% eccentric stenosis in the proximal left anterior      descending.  3. Lumen irregularity with narrowing of 10% in the right coronary      artery.  4. Successful percutaneous coronary intervention with cutting balloon      arthrotomy and stenting of the proximal left anterior descending      with ultimate insertion of a 3.5 x 13 mm drug-eluting Cypher stent      postdilated to 3.71 mm, done with Angiomax, Plavix, intracoronary      nitroglycerin, as well as  IV nitroglycerin.           ______________________________  Nicki Guadalajara, M.D.     TK/MEDQ  D:  10/14/2006  T:  10/14/2006  Job:  [id number]   cc:   Gerlene Burdock A. Alanda Amass, M.D.  Jeffrey A. Tawanna Cooler, MD

## 2010-08-15 NOTE — Discharge Summary (Signed)
NAMEAKERIA, HEDSTROM NO.:  [id number]   MEDICAL RECORD NO.:  [id number]          PATIENT TYPE:  INP   LOCATION:  2028                         FACILITY:  [facility]   PHYSICIAN:  Ritta Slot, MD     DATE OF BIRTH:  October 25, 1920   DATE OF ADMISSION:  01/28/2008  DATE OF DISCHARGE:  01/29/2008                               DISCHARGE SUMMARY   DISCHARGE DIAGNOSES:  1. End-of-life generator pacemaker with explantation of previous      Medtronic model number [id number] and implantation of Medtronic [place],      number [id number].  2. History of syncope with complete heart block and ventricular      asystole requiring initial pacemaker, March 16, 1999.  3. Hypertension, controlled.  4. Chronic hoarseness.  5. Coronary disease with the left anterior descending, drug-eluting      stent in September 2007 with a Cypher stent, continues to be patent      as per catheterization of August 2008.  6. Minor left ventricular dysfunction, ejection fraction 45-50%.   DISCHARGE CONDITION:  Stable.   PROCEDURES:  Explantation of old permanent transvenous pacemaker device,  Medtronic, and implantation of new Medtronic device.  Leads remain the  same.   DISCHARGE MEDICATIONS:  1. Plavix 75 mg as before, which was every other day.  2. Verapamil 240 mg daily.  3. Metoprolol 100 mg twice a day.  4. Levothyroxine 75 mcg daily.  5. Hydrochlorothiazide 12.5 mg daily.  6. Nexium 40 mg daily.  7. Aspirin 81 mg daily.  8. PreserVision  eye vitamins daily.  9. Multivitamin daily.  10.Tums 750 mg 2 daily.  11.Claritin 10 mg as needed.   DISCHARGE INSTRUCTIONS:  1. Low-sodium heart-healthy diet.  2. Do not get the site wet until February 05, 2008, then may shower and      pat dry.  3. Increase activity slowly.  4. No lifting for 1 week.  No driving for 1 week.  5. Follow up with Dr. Lynnea Ferrier for pacer site check on February 03, 2008, at 12:15.  6. Follow up with Dr. Alanda Amass as  previously instructed on February 25, 2008, at 3:00 p.m.   HISTORY OF PRESENT ILLNESS:  An 77 year old widowed mother of 2 was  scheduled for pacemaker generator change with Dr. Lynnea Ferrier on January 28, 2008.  The patient has a history of syncope with complete heart block  and ventricular asystole, receiving her initial permanent transvenous  pacemaker on March 16, 1999.  On recent TELETRACE, she was found to  have end-of-life documentation with reversion to VVIR at 65 beats per  minute.  She is not pacemaker dependent and plans were made to bring her  in as an outpatient elective procedure.   PAST MEDICAL HISTORY:  Permanent transvenous pacemaker as described.  In  addition, she has coronary disease with history of LAD stent for an 80%  lesion in July 2008 with a drug-eluting Cypher stent and normal LV  function on 2 caths since that time for chest pain, had found the  stent  to be patent.  Additionally, she has chronic hoarse voice and has had an  ENT evaluation that did not find a source, and she has just maintained  her hoarseness.  She is also hypothyroid and is on Synthroid.   FAMILY HISTORY, SOCIAL HISTORY, AND REVIEW OF SYSTEMS:  See H&P.   PHYSICAL EXAMINATION AT DISCHARGE:  VITAL SIGNS:  Blood pressure 145/75,  pulse 62, respiratory 18, temperature 97.4, and room air oxygen  saturation 99%.  HEART:  Regular rate and rhythm.  No murmur, gallop, rub, or click.  LUNGS:  Clear to auscultation bilaterally.  EXTREMITIES:  Without edema.  CNS: Alert and oriented x3.  Follows commands.  SKIN:  Skin at the pacemaker site, no hematoma and no erythema and well  approximated with Steri-Strips.   LABORATORY DATA PREPROCEDURE:  Hemoglobin was 12.8, hematocrit 36.6,  platelets 204, and WBC 7.5.  INR is 0.85.  PTT 29.  TSH 1.810, BUN 18,  creatinine 0.8, and potassium 4.1.  UA was clear.  EKG sinus rhythm.   REVIEW OF SYSTEMS:  UA was rechecked and it was negative.  A chest  x-ray  prior to admission, mild cardiac enlargement, which is stable.  Pacer is  in satisfactory position.  Pulmonary interstitial densities are chronic.  Atherosclerotic changes are re-demonstrated within the aorta.  The  patient is osteopenic with diffuse mid thoracic spine degenerative  changes, but stable chest.   HOSPITAL COURSE:  The patient was brought into outpatient procedure,  underwent pacemaker generator explantation and new generator  implantation, tolerated the procedure, please see Dr. Donavan Burnet note for  full details.  She was transferred to telemetry floor for overnight  observation, which she did well.  By the next morning, she had no  complaints and was ready for discharge to home.  Her site looked well.  She had some AV pacing on the monitor as well and maintaining sinus  rhythm.  She was seen and examined by Dr. Lynnea Ferrier and will be followed  up by her primary cardiologist, Dr. Alanda Amass.      [name]. [place], N.P.      Ritta Slot, MD  Electronically Signed   LRI/MEDQ  D:  01/29/2008  T:  01/30/2008  Job:  [id number]   cc:   Gerlene Burdock A. Alanda Amass, M.D.  Jeffrey A. Tawanna Cooler, MD

## 2010-08-15 NOTE — Discharge Summary (Signed)
NAMEMarland Kitchen  [name] Luna, [name] Luna NO.:  [id number]   MEDICAL RECORD NO.:  [id number]          PATIENT TYPE:  INP   LOCATION:  2908                         FACILITY:  [facility]   PHYSICIAN:  Abelino Derrick, P.A.   DATE OF BIRTH:  1920/11/16   DATE OF ADMISSION:  07/23/2007  DATE OF DISCHARGE:  07/25/2007                               DISCHARGE SUMMARY   DISCHARGE DIAGNOSES:  1. Chest pain worrisome for unstable angina, patent LAD Cypher stent      by catheterization in this admission.  2. Known coronary disease with LAD cutting balloon and Cypher stenting      July 2008.  3. History of complete heart block, status post permanent pacemaker      implant in the past.  4. Treated hypertension.  5. Premature ventricular contractions.  6. Macular degeneration.  7. Treated hypothyroidism.   HOSPITAL COURSE:  [name] Luna is an 77 year old female who followed by Dr.  Alanda Amass with a history of coronary disease.  She had a LAD, cutting  balloon atherectomy, and Cypher stent in July 2008.  She had complete  heart block and underwent a pacemaker in November 2007.  She has an EF  of 45-50%.  In September 2008, she presented with syncope, felt  secondary to Imdur.  At that time, she was cathed and had a widely  patent LAD stent.  She was seen by Dr. Alanda Amass on July 22, 2007, for  routine office visit.  She had been doing well.  She presented on July 23, 2007, with substernal chest pain.  This was worrisome for unstable  angina.  She was admitted to telemetry, and started on IV heparin.  D-  dimer was 1.76, CT scan was negative for pulmonary embolism.  Troponins  were negative.  She underwent diagnostic catheterization on July 24, 2007, which again revealed a patent LAD stent.  The circumflex OM and  RCA were also normal.  LV gram was not performed.  We feel she can be  discharged on July 25, 2007.  She has an echo to be done as an  outpatient in next week, and she will keep this  appointment.   DISCHARGE MEDICATIONS:  1. Aspirin 81 mg a day.  2. Metoprolol 100 mg twice a day.  3. Nexium 40 mg a day.  4. Nitroglycerin sublingual p.r.n.  5. Plavix 75 mg a day.  6. Synthroid 0.05 mg a day.  7. Verapamil 240 mg a day.  8. Prednisone eye drops as taken at home.   LABS:  The telemetry is AV paced.  White count 7.2, hemoglobin 11.2,  hematocrit 32.7, platelets 157, sodium 141, potassium 3.2, BUN 6,  creatinine 0.6, cholesterol 151, LDL 99, HDL 44, triglycerides 42, CKs  were slightly elevated at 264 with 10 MBs with a troponin negative, TSH  is 5.94.  D-dimers 1.76.  CT showed no pulmonary embolism with  cardiomegaly.  INR is 1.0.   DISPOSITION:  The patient is discharged in stable condition, and will  keep appointment for her echocardiogram on the first.  She will follow  up with Dr. Alanda Amass.      Abelino Derrick, P.ALenard Lance  D:  07/25/2007  T:  07/26/2007  Job:  [id number]

## 2010-08-15 NOTE — Discharge Summary (Signed)
NAME:  [name] Luna, [name] Luna              ACCOUNT NO.:  [id number]   MEDICAL RECORD NO.:  [id number]          PATIENT TYPE:  INP   LOCATION:  4736                         FACILITY:  [facility]   PHYSICIAN:  Richard A. Alanda Amass, M.D.DATE OF BIRTH:  Jan 31, 1921   DATE OF ADMISSION:  03/12/2008  DATE OF DISCHARGE:  03/13/2008                               DISCHARGE SUMMARY   DISCHARGE DIAGNOSES:  1. Chest pain and shortness of breath of undetermined etiology,      myocardial infarction ruled out, CT of the chest, abdomen, and      pelvis showing no significant abnormalities including no pulmonary      embolism and no aortic dissection.  2. Known coronary disease with left anterior descending stenting in      July 2008 with recatheterization in August 2008 and April 2009      showing patency of the site.  3. Treated hypothyroidism.  4. Treated hypertension.  5. History of permanent pacemaker implant secondary to complete heart      block.  6. Stressful social situation, the patient's husband recently passed      away and she lives alone, although her son has been staying with      her for support.  7. Cirrhosis noted on abdominal CT of unclear etiology or      significance.   HOSPITAL COURSE:  Ms. Menton is an 77 year old female followed by Dr.  Alanda Amass.  She has coronary disease as described above.  She was  admitted on March 12, 2008 with chest pain and shortness of breath.  She was admitted to telemetry.  Her CKs were elevated, but her MBs and  troponins were negative.  The patient was seen by Dr. Alanda Amass who felt  she could probably have an outpatient Myoview.  He did not feel she  needed recatheterization.  We did order a CT of her chest, abdomen, and  pelvis to rule out aortic dissection or pulmonary embolism.  CT of her  chest was negative.  CT of her abdomen was negative for aortic  dissection, although she did have a incidental finding of a 1-cm splenic  artery aneurysm and some  hepatic cirrhosis.  CT of the pelvis was  negative.  Her LFTs were normal.  We feel she can be discharged on  March 13, 2008.  We did increase her PPI to b.i.d. for 2 weeks.   DISCHARGE MEDICATIONS:  1. Plavix 75 mg every other day.  2. Verapamil CR 240 daily.  3. Metoprolol 100 mg twice a day.  4. Levothyroxine 0.075 mg a day.  5. Nexium 40 mg b.i.d. for 2 weeks and then Nexium 40 mg a day.  6. Diovan 40 mg a day.  7. Aspirin 81 mg a day.  8. Multivitamin daily.  9. Claritin 10 mg a day.  10.Tums p.r.n.   LABORATORY DATA:  White count 6.4, hemoglobin 12.4, hematocrit 36.8,  platelets 177.  CKs went to 168 with 6.8 MBs, but her troponins were  negative x3.  Her LDL was 103.  Sodium 143, potassium 4.0, BUN 12,  creatinine 0.6.  LFTs were normal.  Hemoglobin A1c is 5.6.   DISPOSITION:  The patient is discharged in stable condition and will  follow up with Dr. Alanda Amass as an outpatient.      Abelino Derrick, P.A.      Richard A. Alanda Amass, M.D.  Electronically Signed    LKK/MEDQ  D:  03/13/2008  T:  03/13/2008  Job:  [id number]   cc:   Gerlene Burdock A. Alanda Amass, M.D.

## 2010-08-15 NOTE — Discharge Summary (Signed)
NAME:  KODEE, DRURY              ACCOUNT NO.:  [id number]   MEDICAL RECORD NO.:  [id number]          PATIENT TYPE:  INP   LOCATION:  6532                         FACILITY:  [facility]   PHYSICIAN:  Richard A. Alanda Amass, M.D.DATE OF BIRTH:  01-Sep-1920   DATE OF ADMISSION:  10/11/2006  DATE OF DISCHARGE:  10/15/2006                               DISCHARGE SUMMARY   [name] is an 77 year old with a prior medical history of  mild CAD, complete heart block with pacemaker implant, implantation in  the year 2000.  Last cath has been September 2007.  She had a 60% LAD.  She apparently was awakened with sharp, midsternal chest pain, radiation  to her back, left arm with shortness of breath, mild diaphoresis and  palpitations.  She took some nitroglycerin with relief, but symptoms  were on and off all night.  Thus, she came into the hospital and was  admitted for further evaluation.  Her alkaline phosphatase was noted to  be elevated at 331.  She had amylase and lipase done, which were normal.  Her CKMBs were normal.  It was decided she should undergo cardiac  catheterization.  This was performed on 10/14/2006 by Dr. Tresa Endo.  She  had a progressed LAD of 80%.  Thus, she underwent PCI and 3.5 x 13  Cypher stent.  The following morning, she was seen by Dr. Tresa Endo.  She  was feeling well.  She had no chest pain.  Her groin was stable.  It was  decided that she could be discharged home after walking in the hall and  being seen by cardiac rehab.   LABS:  Post-procedure, her CKMB 117/3.7 and 103/33.5, troponin was 0.10  and 0.10.  Sodium was 127, potassium 4.7, BUN 8, creatinine 0.11, blood  pressure is 121.  Prior CKMB on admission:  281/10/6, relative index was  3.8, troponin 0.05 and 179/10.3, relative index 4.1, troponin 0.05 and  172/5.1, relative index of 3.0, troponin 0.05 and 250/13.4, relative  index 5.4, and troponin was 0.04.  TSH was 4.706.  Alkaline phosphatase  ranged from 331 to  275.  ST elevation was 41, on 10/14/2006 was 28.  ALT  was 31 to 24.  Amylase was 69, lipase was 35.   DISCHARGE MEDICATIONS:  1. Toprol 100 mg twice daily.  2. Zestril 10 mg twice daily.  3. Crestor 10 mg daily.  4. Synthroid 50 mcg daily.  5. Calan 120 mg daily.  6. HCTZ 12.5 mg daily.  7. Nexium 40 mg daily.  8. Aspirin 325 mg daily.  9. Plavix 75 mg daily.   DISCHARGE DIAGNOSES:  1. Acute coronary syndrome.  2. Progressive coronary artery disease with left anterior descending      80% with subsequent percutaneous coronary intervention with Cypher      stent, performed by Dr. Tresa Endo.  3. History of paroxysmal supraventricular tachycardia.  4. __________ .  5. Hyperlipidemia.  6. Hypertension, this was increased; blood pressure initially was      184/76, but on the day of discharge, it was      134/52.  7. Hypothyroidism, treated.  8. History of mild vascular disease.      Lezlie Octave, N.P.      Richard A. Alanda Amass, M.D.  Electronically Signed    BB/MEDQ  D:  10/15/2006  T:  10/15/2006  Job:  [id number]   cc:   Dr. Tawanna Cooler

## 2010-08-15 NOTE — H&P (Signed)
NAMEMarland Luna  [name], Luna NO.:  [id number]   MEDICAL RECORD NO.:  [id number]          PATIENT TYPE:  INP   LOCATION:  4734                         FACILITY:  [facility]   PHYSICIAN:  Corwin Levins, MD      DATE OF BIRTH:  14-Mar-1921   DATE OF ADMISSION:  11/15/2006  DATE OF DISCHARGE:                              HISTORY & PHYSICAL   CHIEF COMPLAINT:  Syncopal episode today at approximately 3 p.m.   HISTORY OF PRESENT ILLNESS:  [name] is an 77 year old white female  here in the emergency room after an episode of loss of consciousness  lasting approximately 60 seconds around dinnertime while she was seated  having dinner.  She recalls feeling some sort of epigastric or low  sternal area discomfort and funny all over just prior.  The episode  was witnessed by the son.  She was slumped in the chair but no following  injury.  She sort of gurgled and shook briefly but no seizure or  incontinence.  She was confused for approximately 5 minutes after.  She  was started on Im-Dur 30 mg today just prior to discharge.  The son now  recalls that in 2007 there was a spell where Im-Dur was discontinued and  thought inappropriate at that time due to the reaction.   PAST MEDICAL HISTORY ILLNESSES:  1. History of coronary artery disease mild with catheterization just      November 14, 2006 with a patent PCI and stent to the LAD lesion.  2. Acute coronary syndrome October 11, 2006 with some question of basal      spasm given her negative catheterization yesterday.  3. History of complete heart block status post pacemaker 2000.  4. Known PSVT.  5. Hypertension.  6. Hypercholesterolemia.  7. Hypothyroidism.  8. Peripheral vascular disease, mild, carotid less than 50% bilateral      in 2007.  9. Chronic obstructive pulmonary disease  10.Macular degeneration.  11.History of hallucinations.  12.History of fall with head injury.  13.GERD.   SURGERIES:  None.   ALLERGIES:  PREDNISONE,  CODEINE, PENICILLIN, ATIVAN.   CURRENT MEDICATIONS:  1. ImDur 300 mg p.o. daily for which she only received 1 dose early      this morning just prior to the discharge by cardiology.  2. Toprol XL 100 mg one p.o. daily.  3. Verapamil SR 240 mg one p.o. daily.  4. Aspirin 81 mg p.o. daily.  5. K-Dur 40 mEq p.o. daily.  6. Nexium 40 mg p.o. daily.  7. Synthroid 50 mcg p.o. daily.  8. Hydrochlorothiazide 12.5 mg p.o. daily.  9. Plavix 75 mg p.o. daily.  10.Crestor 10 mg p.o. daily.  11.Zestril 10 mg p.o. daily.   SOCIAL HISTORY:  Married, 2 children, no tobacco, no alcohol.   FAMILY HISTORY:  Otherwise noncontributory.   REVIEW OF SYSTEMS:  Otherwise noncontributory.   PHYSICAL EXAMINATION:  GENERAL:  She is alert and oriented.  VITAL SIGNS:  O2 saturation 98%.  Afebrile.  Blood pressure 148/73,  heart rate 80, respirations 20.  ENT:  Sclerae are clear.  TMs are clear.  Pharynx benign.  NECK:  Without lymphadenopathy, JVD, thyromegaly.  CHEST:  No rales or wheezing.  CARDIAC:  Regular rate and rhythm.  ABDOMEN:  Soft, nontender, positive bowel sounds.  No organomegaly or  masses.  EXTREMITIES:  No edema.   LABORATORY:  ECG not in the chart.  Pacemaker checked, apparently okay  by the [employer]tr[employer] tech called into the ER.   Chest x-ray, no acute disease.   Hemoglobin 10.8, white blood cell count 5.7, CPK MB 113 and 3.9.  Troponin I 0.0.  Electrolytes within normal limits.  BUN 18, creatinine  0.9 and glucose 167.   ASSESSMENT AND PLAN:  1. Syncope, questionable vasovagal versus Im-Dur reaction versus      cardiovascular versus other.  She is to hold the Im-Dur tonight,      will otherwise admit.  Apply telemetry, rule out myocardial      infarction with cardiac enzymes.  Continue all other chronic      medications at the time.  Consider cardiology consult.  Give also      low dose IV fluids tonight.  2. Coronary artery disease as above.  3. Status post pacemaker, otherwise,  stable apparently.  4. Other medical problems, otherwise, as above.  Continue home      medications.  5. Code status full.  6. Prophylaxis.  Give PPI therapy and Lovenox subcutaneous.   DISPOSITION:  For home when improved.      Corwin Levins, MD  Electronically Signed     JWJ/MEDQ  D:  11/15/2006  T:  11/16/2006  Job:  [id number]   cc:   Gerlene Burdock A. Alanda Amass, M.D.  Nanetta Batty, M.D.  Jeffrey A. Tawanna Cooler, MD

## 2010-08-15 NOTE — Discharge Summary (Signed)
[name] Luna, [name] Luna NO.:  [id number]   MEDICAL RECORD NO.:  [id number]          PATIENT TYPE:  INP   LOCATION:  4734                         FACILITY:  [facility]   PHYSICIAN:  Vonna Kotyk R. Jacinto Halim, MD       DATE OF BIRTH:  21-Aug-1920   DATE OF ADMISSION:  11/14/2006  DATE OF DISCHARGE:  11/15/2006                               DISCHARGE SUMMARY   [name] Luna is an 77 year old female patient of Dr. Alanda Amass who came in  by EMS because of a severe chest pain 8/10.  It woke her from sleep.  It  was mid sternal with radiation in her left wrist into her left arm  associated with nausea, shortness of breath and diaphoresis.  She had no  vomiting.  The pain lasted 20-30 minutes but eased, but then again  returned.  She was given serial nitroglycerin with some relief and they  called EMS and she was brought to the emergency room.  She was seen by  Dr. Andree Coss, admitted for chest pain, put on IV heparin and IV  nitroglycerin and decided she should undergo cardiac catheterization.  The catheterization was performed on November 14, 2006 by Dr. Dossie Arbour.  Her LAD stent was patent.  She had no other disease.  Please see note  for complete details.  On November 14, 2006, she also had a pacemaker  interrogation.  She had 11 ventricular heart rates in the last 13 days,  however, the longest was only 2 seconds.  On November 15, 2006, she was  seen by Dr. Jacinto Halim.  She did have elevation only very mild of her cardiac  enzymes.  A possibility may be cord spasm, thus, Imdur was added on the  day of discharge.  She was already on a high dose of Toprol on 100 and  verapamil 240.  This was not in previous but in the future may be a  consideration secondary to her bouts of SVT versus VT.   LABORATORY:  Hemoglobin 10.0, hematocrit 32.3, platelets are 185,000.  WBCs were 5.7.  Sodium was 141, potassium 3.7, BUN 11, creatinine 0.57,  glucose was 95, chloride was 108, CO2 was 26.  Alkaline phosphatase  was  296.  AST was 37.  ALT was 33. CK-MG 1)  At her point of care marker was  7.8, troponin was less than 0.05.  2)  202/7.2 with a relative index of  3.6 and her troponin was 0.06.  3)  155/5.4, relative index of 3.5 with  a troponin of 0.05.  TSH was 2.389.   There is no chest x-ray in the chart at the time of this dictation.   DISCHARGE DIAGNOSES:  1. Non-S-T elevation myocardial infarction, mild, possibly related to      cord spasm with no progressed coronary disease by coronary      catheterization performed on November 14, 2006 with patent stent in      her left anterior descending and no other disease.  2. Atherosclerotic cardiovascular disease with stent placed to her      left anterior descending October 14, 2006.  3. PTVDP secondary to symptomatic heart block.  4. Multiple high ventricular rates on pacemaker but they all relaxed      at 2 seconds.  5. Paroxysmal supraventricular tachycardia versus ventricular      tachycardia.  6. Hyperlipidemia.  She is on low dose Crestor that was held because      of elevated hepatic levels but at the time of      discharge, I only see that her alkaline phosphatase was elevated,      thus, it was restarted.  7. History of hypertension.  At the time of discharge, her blood      pressure was 143/70.  8. Gastroesophageal reflux disease.      [name] Luna, N.P.      [name] Luna. Jacinto Halim, MD  Electronically Signed    BB/MEDQ  D:  11/15/2006  T:  11/16/2006  Job:  [id number]   cc:   __________, Judie Petit.D.

## 2010-08-18 NOTE — Discharge Summary (Signed)
NAME:  [name] Luna, [name] Luna NO.:  [id number]   MEDICAL RECORD NO.:  [id number]                   PATIENT TYPE:  INP   LOCATION:  3735                                 FACILITY:  [facility]   PHYSICIAN:  Richard A. Alanda Amass, M.D.          DATE OF BIRTH:  03-14-1921   DATE OF ADMISSION:  06/18/2002  DATE OF DISCHARGE:  06/20/2002                                 DISCHARGE SUMMARY   DISCHARGE DIAGNOSES:  1. Chest pain with negative myocardial infarction.  2. Arrhythmia with no documentation but given lidocaine by emergency medical     service.  No further arrhythmias.  3. Hypokalemia, repleted.  4. Sick sinus syndrome with a history of permanent pacemaker.  5. Recurrent shingles.  6. Elevated alkaline phosphatase but improved at discharge.  7. Hypertension, improved.   DISCHARGE CONDITION:  Improved.   DISCHARGE MEDICATIONS:  1. Enteric-coated aspirin 325 mg daily.  2. Premarin 0.625 daily.  3. Zantac 300 mg daily.  4. Ocuvite 2 twice a day.  5. Zestoretic 20/25 one daily.  6. Zestril 20 mg 1 every evening.  7. Norvasc 5 mg daily.  8. Metoprolol 50 mg 1 in the morning and 1/2 in the evening.  9. Tums 500 mg 3 daily.  10.      Centrum Silver daily.   DISCHARGE INSTRUCTIONS:  1. Activity as tolerated.  2. Low-fat diet.  3. You need a Persantine Cardiolite and our office will call you with the     date and time.  4. We will call you with date and time for followup appointment with Dr.     Alanda Amass.   HISTORY OF PRESENT ILLNESS:  An 77 year old female patient of Dr. Alanda Amass  with a history of permanent pacemaker in December 2000 secondary to complete  heart block and syncope, also with history of cardiac catheterization  April 14, 1999 with normal coronaries.  Normal LV function.  Presented  July 19, 2002 with chest pain; was awakened at 2 Ericam. with sharp pain under  her left breast and heaviness in her mid chest.  Bilateral arms were aching,  left greater than right.  She was diaphoretic, short of breath, but no  nausea and vomiting.   She got up to the bathroom, did not feel any better, and called 911.  In the  emergency room, she still had some heaviness in her mid chest.   Prior to admission, she had had shingles and was resolving with that.    PAST MEDICAL HISTORY:  1. Permanent pacemaker, as stated.  2. Cardiac catheterization, as stated.  3. Cardiolite was done October 11, 2000.  No ischemia.  EF 79%.  4. Hypertension.  5. Hiatal hernia.  6. Gastroesophageal reflux disease.  7. Poor vision secondary to macular degeneration.  8. Echocardiogram in June 2002:  EF 55%-65%.  Mild focal basoseptal     hypertrophy.  9. History of pancreatitis.   OUTPATIENT  MEDICATIONS:  Same as discharge medications.   ALLERGIES:  1. PENICILLIN.  2. CODEINE.   SOCIAL HISTORY:  See H&P.   FAMILY HISTORY:  See H&P.   REVIEW OF SYSTEMS:  See H&P.   DISCHARGE PHYSICAL EXAMINATION:  VITAL SIGNS:  Blood pressure 130/70, pulse  75, respirations 14.  Room air oxygen saturation 97%.  GENERAL:  Alert and oriented female in no acute distress.  Feels well even  after ambulating in the hall.  LUNGS:  Clear.  HEART:  Regular rate and rhythm.  SKIN:  She still has the rash around T11-12, dermatome level, secondary to  shingles.   LABORATORY DATA:  Hemoglobin on admission 11.4, hematocrit 32.9, WBC 6.1,  MCV 85.5, platelets 236, neutrophils 61, lymphs 24, monos 12, eos 3, basos  1.  Pro time 12, INR 0.8, PTT 30.  D-dimer was 2.04.  On heparin, she was  therapeutic.   Chemistry:  Sodium 137, potassium 3 on admission.  That was repleted and  prior to discharge 4.4.  Chloride 101, CO2 29, glucose 110, BUN 24,  creatinine 0.8, calcium 9, total protein 7, albumin 3.1, AST 36, ALT 31, ALP  210, total bilirubin 0.6.  Followup alkaline phosphatase was 233 but prior  to discharge has come down to 197.  Lipase was 58 and amylase was 102.   Cardiac  enzymes:  CK's ranged 80 to 75; MB's 4 to 2.8; and troponin 0.01 to  0.05.  Negative for MI.   Chest x-ray:  No focal consolidation or edema.  Pericardial silhouette is  enlarged.  Right permanent pacemaker is noted.  Left base atelectasis.   CT of her chest:  Rule out PE.  No evidence for acute pulmonary embolus on  CT.  Minimal biapical scarring, left greater than right.  Technically,  neoplasm cannot be excluded.  Right-sided permanent pacer and DVT study was  negative.  EKG on admission at 0700:  Sinus rhythm, normal EKG, no  significant change.  Followup on the evening of April 18, again no  significant change.   HOSPITAL COURSE:  The patient was admitted by Dr. Jenne Campus on July 19, 2002  with chest pressure, heaviness, with some type of arrhythmia and in the  ambulance was given lidocaine by EMS but no documented strips were found.  She was put on IV heparin and nitroglycerin.  D-dimer was elevated.  Spiral  CT of the chest was done to rule out PE.  Questionable history of  pancreatitis, amylase and lipase were essentially negative.  Lipase was  minimally elevated.  It was noted her alkaline phosphatase was up.  It did  decrease prior to discharge.   By July 21, 2002, she was stable.  No further chest pain with history of  patent cors in the past.  She was discharged by Dr. Clarene Duke and follow up in  our office with an outpatient Cardiolite and her primary cardiologist, Dr.  Alanda Amass, would see her.     [name] Luna. Ingold, N.P.                     Richard A. Alanda Amass, M.D.    LRI/MEDQ  D:  07/19/2002  T:  07/20/2002  Job:  [id number]   cc:   [name] Luna [name] Luna, M.D. [facility]

## 2010-08-18 NOTE — Procedures (Signed)
[place]  Patient:    [name] Luna, [name] Luna                     MRN: [id number] Proc. Date: 10/15/00 Adm. Date:  [id number] Attending:  Ruta Hinds CC:         Delrae Rend, M.D.  Claretta Fraise, M.D.   Procedure Report  DATE OF BIRTH:  04-19-1920  PROCEDURE PERFORMED:  Esophagogastroduodenoscopy  ENDOSCOPIST:  Anselmo Rod, M.D.  INSTRUMENT USED:  Olympus video panendoscope  INDICATION FOR PROCEDURE:  Several bouts of severe epigastric pain with unrevealing abdominal ultrasound except for pancreatic calcification in a 77 year old white female, rule out peptic ulcer disease, esophagitis, gastritis, etc.  PREPROCEDURE PREPARATION:  Informed consent was procured from the patient. The patient was fasted for eight hours prior to the procedure.  PREPROCEDURE PHYSICAL:  VITAL SIGNS:  The patient had stable vital signs.  NECK:  Supple.  CHEST:  Clear to auscultation.  Pacemaker present.  ABDOMEN:  Soft with well healed surgical scars from previous laparoscopic cholecystectomy, hysterectomy and appendectomy.  There is epigastric tenderness on palpation with guarding.  No rebound or rigidity.  No hepatosplenomegaly.  DESCRIPTION OF PROCEDURE:  The patient was placed in the left lateral decubitus position and sedated with 40 mg of Demerol and 5 mg of Versed intravenously. Once the patient was adequately sedated and maintained on low flow oxygen and continuous cardiac monitoring, the Olympus video panendoscope was advanced through the mouth piece, over the tongue into the esophagus under direct vision.  The entire esophagus appeared normal without evidence of ring, stricture, masses, lesions, esophagitis or Barretts mucosa.  The scope was then advanced into the stomach.  There was moderate diffuse gastritis throughout the gastric mucosa with no evidence of erosions or ulcerations.  A small hiatal hernia was seen on  high retroflexion.  The proximal small bowel appeared normal.  There was no outlet obstruction.  IMPRESSION: 1. Normal appearing esophagus and proximal small bowel. 2. Moderate diffuse gastritis. 3. Small hiatal hernia.  RECOMMENDATIONS: 1. Recheck lipase today. 2. Continue PPIs for now. 3. CT scan of abdomen and pelvis today to further workup her abdominal pain.    Further recommendations will be made thereafter. DD:  10/15/00 TD:  10/15/00 Job: [id number]

## 2010-08-18 NOTE — H&P (Signed)
[place]  Patient:    [name] Luna                      MRN: [id number] Adm. Date:  [id number] Attending:  Armanda Magic                         History and Physical  CHIEF COMPLAINT:  Syncope.  HISTORY OF PRESENT ILLNESS:  This is a 77 year old white female with a previous  history of hypertension (reportedly well controlled), normal cardiac catheterization several years ago, who presented to the emergency room with a syncopal episode.  She was sitting at lunch with her husband drinking a Coke when her eyes rolled back in her head, and she passed out.  Upon awakening, she was nauseated and vomited x 1.  She denies any symptoms such as dizziness, nausea, vomiting, or presyncope prior to the episode.  She denies any palpitations.  On  arrival to the emergency room, she had vomiting x 1.  While lying in bed, she was noted to have normal sinus rhythm which went into complete heart block with rare ventricular escape and a 13-second pause until normal sinus rhythm to sinus tachycardia resumed.  The patient was asymptomatic during that episode. Currently, she complains of a severe headache, actually the worst headache she has ever had, since this weekend.  She denies any visual changes or neurologic changes.  PAST MEDICAL HISTORY:  Significant for hypertension, normal cardiac catheterization years ago.  PAST SURGICAL HISTORY:  Status post appendectomy and status post bilateral inguinal hernia repair.  ALLERGIES:  PENICILLIN and CODEINE.  MEDICATIONS:  Premarin and Lopressor.  SOCIAL HISTORY:  She is married with two children.  She denies tobacco or alcohol use.  FAMILY HISTORY:  Her father died secondary to a cerebral hemorrhage.  Mother died secondary to CVA.  REVIEW OF SYSTEMS:  She has had a headache for several days which is the worst headache she has ever had.  She has also had some mild clear sinus drainage. She denies any  fevers or chills.  She has had nausea and vomiting x 2 episodes today. She denies any abdominal pain, diarrhea, neurologic changes, and edema.  PHYSICAL EXAMINATION:  VITAL SIGNS:  Blood pressure 197/107 with a heart rate of 90 to 100.  GENERAL:  This is a well-developed, thin, white female in mild distress secondary to severe headache.  NECK:  Supple without lymphadenopathy.  No bruits, +2 carotid upstrokes bilaterally.  LUNGS:  Clear to auscultation throughout.  CARDIAC:  Tachycardic and regular.  No murmurs, rubs, or gallops.   Normal S1 and S2.  ABDOMEN:  Soft, nontender, nondistended.  Normoactive bowel sounds.  No hepatosplenomegaly, no bruits, no pulsatile masses.  EXTREMITIES:  No edema, +2 dorsalis pedis pulse bilaterally.  NEUROLOGIC:  Alert and oriented x 3 with no gross neurologic abnormalities.  LABORATORY DATA:  Pending except for a creatinine of 4 which is new.  EKG shows normal sinus rhythm at 91 beats per minute with no ST-T abnormalities. There is left axis deviation.  Telemetry shows normal sinus rhythm with frequent PVCs.  ASSESSMENT: 1. Syncope of unknown etiology.  Most likely the etiology is primary arrhythmia    with transient complete heart block and a 13-second pause noted in the emergency    room.  The patient also is very hypertensive and, therefore, need to rule out    aortic dissection. 2. Severe hypertension,  uncontrolled. 3. Severe headache, probably related to sinus congestion; but, in the setting of    syncope and severe hypertension, need to rule out cerebral bleed. 4. Acute renal failure probably secondary to hypertensive urgency.  PLAN: 1. Admit to telemetry bed. 2. Rule out myocardial infarction with serial enzymes. 3. Check thyroid panel. 4. Transesophageal echocardiogram to rule out aortic dissection.  Cannot perform    CT scan due to high creatinine. 5. Head CT without contrast to rule out cerebral bleed. 6. IV Nipride  drip for acute blood pressure control.  Would avoid antihypertensive    agents with negative ______ properties secondary to secondary to    bradycardic arrhythmias. 7. Probable temporary pacemaker insertion and eventual permanent pacemaker. DD: 03/14/99 TD:  03/14/99 Job: [id number]

## 2010-08-18 NOTE — Discharge Summary (Signed)
[place]  Patient:    [name] Luna, [name] Luna                     MRN: [id number] Adm. Date:  [id number] Disc. Date: 09/25/00 Attending:  Virgina Evener Dictator:   [name]. Annie Paras, F.N.P.C. CC:         Richard A. Alanda Amass, M.D.  Evette Georges, M.D. [facility]   Discharge Summary  DISCHARGE DIAGNOSES: 1. Chest pain, nonspecific. 2. Chest discomfort secondary to Norvasc. 3. Premature ventricular contractions, resolved. 4. History of normal coronary arteries. 5. Status post ventricular demand inhibited pacemaker.  CONDITION ON DISCHARGE:  Improved.  PROCEDURES:  None.  DISCHARGE MEDICATIONS: 1. Zestoretic 20/25 one daily. 2. Zestril 20 one every evening. 3. Enteric coated aspirin 325 mg daily. 4. Protonix 40 mg one daily instead of Zantac. 5. Tums 300 mg three a day. 6. Toprol 50 mg tablets take 1-1/2 tablets every 12 hours. 7. Multivitamin as before. 8. Premarin 0.625 mg daily. 9. Claritin 10 mg daily.  ACTIVITY:  No strenuous activity until after the stress test.  DIET:  Low fat, low salt diet.  FOLLOWUP:  Persantine Cardiolite is scheduled for October 11, 2000, at 11 a.m. on second floor of [facility].  Do not eat or drink after midnight the night before the test.  Follow up with Dr. Mancel Parsons nurse practitioner, Lezlie Octave, on July 16, at 3 p.m.  HISTORY OF PRESENT ILLNESS:  This is a 77 year old, white married female without any history of coronary disease and actually a coronary catheter in January 2001, revealed normal coronaries.  She was awakened at 1 a.m. with weakness, general malaise, heaviness in her chest, shortness of breath and felt hot.  She had no radiation or palpitations.  She rated her heaviness at 4/10 on a 1-10 scale.  She did not feel well the day prior to this.  She stated that once her Norvasc was started three weeks ago, she has felt increased dizziness and fatigue since that  time.  PAST MEDICAL HISTORY: 1. VVIR pacer secondary to syncope and complete heart block of March 16, 1999, with [employer] pacer. 2. History of hypertension. 3. Normal coronaries by cardiac catheterization with EF 65%.  PAST SURGICAL HISTORY: 1. Hysterectomy. 2. Cholecystectomy. 3. Hernia repair.  MEDICATIONS:  1. Norvasc 2.5 mg daily.  2. Zestoretic 20/25 one daily.  3. Zestril 20 mg one every evening.  4. Tavist one daily.  5. Premarin 0.625 mg daily.  6. Zantac 75 mg four daily.  7. Aspirin 325 mg daily.  8. Tums 500 mg three daily.  9. Multivitamin daily. 10. Metoprolol 50 mg every 12 hours.  ALLERGY:  CODEINE.  For family history, social history and review of systems, please see H&P.  DISCHARGE PHYSICAL EXAMINATION:  VITAL SIGNS:  Blood pressure 120/70, pulse 64, respiratory rate 20, temperature 97.2, oxygen saturation 98%.  GENERAL:  Alert, oriented white female in no acute distress.  HEART:  S1, S2, regular rate and rhythm with a 2/6 systolic murmur.  LUNGS:  Clear.  ABDOMEN:  Soft, nontender, positive bowel sounds.  EXTREMITIES:  Without edema.  LABORATORY DATA AND X-RAY FINDINGS:  TSH slightly elevated at 5.906.  This will need to be followed as an outpatient.  Cardiac enzymes with CK 282, 210, 216, 186; MB 18, 11.6, 10.5, 8.6; troponin I ranged 0.02-0.06, negative for MI.  Chest x-ray revealed chronic cardiomegaly with some scarring at the lung  base.  Pulmonary vascularity was normal.  Dual lead pacer is in place with no acute disease.  A 2D echocardiogram done on September 24, 2000, with LV systolic function normal, EF 55-65%, no left ventricular regional wall abnormalities. Left ventricular wall thickness was mildly increased.  Mild focal basal septal hypertrophy.  EKG on admission with sinus rhythm and PVCs.  Second EKG with first-degree AV block, but PVCs were gone.  Third EKG with AV pacing.  Sodium 142, potassium 4.5, chloride 107, CO2 28,  glucose 108, BUN 6, creatinine 0.9, calcium 9.3, magnesium 2.0.  SGOT was mildly up at 44, SGPT mildly up at 44, Alk phos 202.  Hemoglobin 11.8 and hematocrit 34.6, WBC 6.4. Potassium later prior to discharge was 3.6.  She was given an additional 20 mEq of potassium prior to discharge.  HOSPITAL COURSE:  The patient was admitted on September 24, 2000, monitored throughout hospitalization.  Originally, she had PVCs and was started on lidocaine by EMS.  That was discontinued and on the morning of June 25, a 2D echocardiogram was done.  By June 26, cardiac enzymes were mildly elevated and troponins negative.  The patient did not have a myocardial infarction.  TSH was mildly elevated and this will need to be followed as an outpatient. Additionally, liver function studies were elevated and will need to be followed as an outpatient.  She had hypokalemia throughout the hospital stay and will need to be monitored as an outpatient.  She will get an outpatient Cardiolite and follow up with Lezlie Octave.  If she continues with chest discomfort and negative treadmill, gallbladder ultrasound may be needed. DD: 09/25/00 TD:  09/25/00 Job: [id number]

## 2010-08-18 NOTE — Consult Note (Signed)
NAME:  [name] Luna, [name] Luna NO.:  [id number]   MEDICAL RECORD NO.:  [id number]          PATIENT TYPE:  INP   LOCATION:  2014                         FACILITY:  [facility]   PHYSICIAN:  Genene Churn. Love, M.D.    DATE OF BIRTH:  April 28, 1920   DATE OF CONSULTATION:  12/25/2005  DATE OF DISCHARGE:                                   CONSULTATION   I am asked to see this 77 year old right-handed white married female who  fell early this morning.   HISTORY OF PRESENT ILLNESS:  [name] Luna is 77 years of age, has a known  history of hyperlipidemia, hypertension, macular degeneration, and recurrent  chest pain.  She has had a pacemaker placed in the year 2000 and was  admitted on December 23, 2005 from the emergency room for evaluation of  awakening from sleep with chest pain radiating to both arms.  She underwent  cardiac catheterization on December 24, 2005 which showed 60-70% disease in  the LAD.  She was in her usual state of health according to the family but  developed agitation about 7 p.m. prior to their leaving to go home.  During  the night, she developed increased agitation.  She had hallucinations and  received 1 mg of IV Ativan.  She then fell backward striking her head and  had an abrasions.  She underwent CT scan of the brain and of the cervical  spine showing evidence of small vessel disease of the brain and a right  basal ganglia infarct age indeterminate.  CT scan of the spine showed  evidence of spondylosis.  It is noted by the nurses that she had an  asymmetry of her pupils.  She has no known past history of hallucinations.  She does have a past history of pacemaker, macular degeneration, chest pain,  hypertension, and hyperlipidemia.  She has been on verapamil, Zestril,  Nexium, metoprolol, hydrochlorothiazide, __________ and aspirin and  Synthroid at home.     Laboratory data on this day revealed white blood cell count 6700,  hemoglobin 11.9, hematocrit 34.8,  platelet count 178,000.  Sodium is 140,  potassium 3.3, chloride 106, CO2 content 28, BUN 9, creatinine 0.8, glucose  132.  CT scan and cervical spine CT scan as above.   Examination revealed a well-developed white female.  Blood pressure right  and left arm 180/80, heart rate is 107.  There were no bruits heard.  The  left tympanic membrane was well seen.  The right tympanic membrane was not  well seen.  She was alert and oriented to person and place and month but not  to year.  She is scored 23 out of 30 on MMSE.  Her cranial nerve examination  revealed decreased central vision.  Both disks were seen and flat.  The  extraocular were full.  She had a right ptosis.  She had a left pupil larger  than right.  Corneals were present.  There was no seventh nerve palsy.  Tongue was midline.  Uvula was midline.  Gags present.  Sternocleidomastoid  and trapezius testing was normal.  Motor  examination revealed good strength  in the upper and lower extremities.  Her sensory examination intact to touch  and pinprick.  Deep tendon reflexes were 1+ and plantar responses were  downgoing.  She had an abrasion to her scalp.  Further information from the  family reveals no history of drug or alcohol abuse.  No known history of  stroke.  She probably does have a history of chronic obstructive pulmonary  disease.   IMPRESSION:  1. Confusion, code 298.9.  2. Head trauma after receiving Ativan.   PLAN:  At this time is to consider repeating her CT scan in 24 hours, obtain  a B12, TSH and use Haldol IV for agitation or hallucinations.           ______________________________  Genene Churn. Sandria Manly, M.D.     JML/MEDQ  D:  12/25/2005  T:  12/26/2005  Job:  [id number]

## 2010-08-18 NOTE — Discharge Summary (Signed)
NAME:  [name] Luna, [name] Luna NO.:  [id number]   MEDICAL RECORD NO.:  [id number]                   PATIENT TYPE:  INP   LOCATION:  2001                                 FACILITY:  [facility]   PHYSICIAN:  Richard A. Alanda Amass, M.D.          DATE OF BIRTH:  February 01, 1921   DATE OF ADMISSION:  04/09/2003  DATE OF DISCHARGE:  04/10/2003                                 DISCHARGE SUMMARY   HISTORY OF PRESENT ILLNESS:  The patient is an 77 year old white female, a  medical patient of Dr. Susa Griffins, who was last admitted in August  2004 with chest pain. She has previously undergone permanent transvenous  pacemaker implantation. She has had several catheterizations in the last 13  to 14 years, all of which have shown the coronaries to be clean. She was  admitted for elective catheterization on November 27, 2002. The coronary  arteries were normal. Left ventricular function was normal.   The patient presented to the emergency room last night. She said she awoke  in the early morning with chest discomfort. It was in the midsternum. It  started there. She developed some mild nausea, dry mouth and felt short of  breath. The pain lasted approximately 1 hour. She came to the emergency room  and was seen by the emergency room physician and subsequently admitted to  rule out MI. The discomfort  she had was similar to an episode she had in  August of 2004, when she had her previous catheterization. She has had no  episodes since August.   PAST MEDICAL HISTORY:  1. Hiatal hernia.  2. Gastroesophageal reflux disease.  3. Hypertension.  4. Hypercholesteremia.  5. Status post cholecystectomy.  6. Status post  bilateral inguinal hernia repairs.  7. Status post  appendectomy.  8. Macular degeneration.  9. Nasal congestion with allergies.   CURRENT MEDICATIONS:  1. Nexium 40 mg every day.  2. Zestril 10 mg every day.  3. Norvasc 5 mg every day.  4. Lopressor 50 mg b.i.d.  5.  Ocuvite 2 q. a.m. and q. p.m.  6. Aspirin 81 mg every day.  7. Tums 500 mg t.i.d.  8. Multivitamin tablet p.r.n.   ALLERGIES:  CODEINE CAUSES HER TO PASS OUT. PENICILLIN CAUSES A RASH.   SOCIAL HISTORY:  No ETOH or cigarettes or drugs.   REVIEW OF SYSTEMS:  CV:  Negative.  PULMONARY:  Negative.  CARDIAC:  Negative. GI:  Negative. GU:  Negative. EXTREMITIES:  Negative.   FAMILY HISTORY:  Please see old chart.   PHYSICAL EXAMINATION:  GENERAL:  This is  a thin, well developed, well  nourished white female.  VITAL SIGNS:  Blood pressure currently 158/81, heart rate 75 and regular,  temperature 97, oxygen saturation 97% on 1 liter.  HEENT:  Within normal limits.  NECK:  No bruits.  CHEST:  Clear to auscultation and percussion, no murmur, rub or gallop.  ABDOMEN:  Soft,  nontender, positive bowel sounds, no hepatosplenomegaly.  GU, RECTAL:  Deferred.  EXTREMITIES:  No clubbing, cyanosis or edema. Pulses were +2 and equal  bilaterally.  NEUROLOGIC:  No focal deficits.   IMPRESSION:  1. Chest pain, rule out myocardial infarction, negative catheterization in     August 2004.  2. Gastroesophageal reflux disease.  3. Hypertension.  4. Abnormal liver enzymes.   LABORATORY DATA:  The patient's enzymes were negative. Troponins were 0.5,  0.5 and 0.6 respectively. Myoglobin was 146 and 178. CK was 186. CK-MBs were  5.8, 5.8 and 6.5 respectively. '  Electrolytes showed sodium 142, potassium 3.7, chloride 104, CO2 30, BUN 16,  creatinine 0.7, glucose 110. Liver function tests showed an AST 49, ALT 45  and alkaline phosphatase of 336, total bilirubin is 0.3.   An EKG showed a sinus rhythm with demand pacer. Heart rate was 79. There  were no STT wave changes.   HOSPITAL COURSE:  As noted above CKs and troponins were negative. An EKG was  negative. She was seen in the morning and evaluated by Dr. Jacinto Halim. It was his  opinion her symptoms were due to her reflux. He was also concerned that  the  Zestril could cause an increase  in liver function studies. The patient had  previously been contacted by Dr. Alanda Amass after a recent  office visit  and  told that her cholesterol  was up and that her liver functions were up and  she was not placed on a statin.   It was Dr. Verl Dicker opinion that the patient could be discharged to home. She  is to follow up with Dr. Marina Goodell who follows her for her gastroesophageal  reflux disease and hiatal hernia. Dr. Kandis Cocking office will call and have  her come back in the next  2 to 3 weeks and will repeat  her CMET when she  returns.   DISCHARGE MEDICATIONS:  She will be discharged on her preadmission  medications as before with 2 exceptions. The Zestril is discontinued and the  Norvasc is increased  to 10 mg every day. The patient will be instructed to  take two 5 mg tablets per day, and when those are completed start with a new  prescription of Norvasc 10 mg every day.      Eber Hong, P.A.                 Richard A. Alanda Amass, M.D.    WDJ/MEDQ  D:  04/10/2003  T:  04/10/2003  Job:  [id number]   cc:   Tinnie Gens A. Tawanna Cooler, M.D. [facility]   John N. Marina Goodell, M.D. [facility]

## 2010-08-18 NOTE — Consult Note (Signed)
[place]. [facility]  Patient:    [name] Luna, [name] Luna                     MRN: [id number] Proc. Date: 10/14/00 Adm. Date:  [id number] Attending:  Ruta Hinds CC:         Dr. Jacinto Halim, [facility]   Consultation Report  REASON FOR CONSULTATION:  Atypical chest pain and elevated liver function tests.  ASSESSMENT:  1. Atypical chest pain.  2. Mildly elevated SGOT and SGPT.  3. Minimally elevated amylase and lipase.  4. Bradycardia, status post pacemaker placement.  5. Status post hysterectomy.  6. Status post cholecystectomy.  7. Status post herniorrhaphy, right inguinal hernia.  8. History of reflux with hiatal hernia.  9. Hoarseness. 10. Hypertension. 11. Allergic rhinitis.  RECOMMENDATIONS: 1. Plan for EGD, October 15, 2000, a.m. 2. Decrease aspirin to 81 mg p.o. q.d. 3. Check hepatitis serologies.  HISTORY OF PRESENT ILLNESS:  [name] Luna is a 77 year old woman with a past medical history of atypical chest pain, who is admitted to [facility] on Aug 24, 2000 and October 12, 2000 with this problem.  The patient describes having many episodes of substernal nonexertional pressure-like chest pain, typically awaking her from sleep, and lasting 30 minutes to an hour.  It is associated with profound nausea and severe malaise, as well as diaphoresis. There has been some radiation of the discomfort up to her shoulders.  She denies vomiting.  She denies relief with rest or precipitating by exertion. She denies any association with food intake.  She also denies reflux symptoms. She had an outpatient Cardiolite a week prior to admission which showed no evidence of ischemia, and the patient has also had a cardiac catheterization, which revealed clean coronary arteries one year prior to admission.  She is admitted on October 12, 2000, and serial cardiac enzymes were obtained, revealing normal creatinine kinase and only slight  elevations in the MB fraction.  In addition, she was noted to have slightly elevated transaminases and amylase and lipase.  She also had outpatient gallbladder ultrasound the week prior to admission due to her symptoms which was normal, revealing no evidence of biliary ductal stones.  We are consulted to consider GI sources of her pain.  ALLERGIES:  PENICILLIN causes rash.  CODEINE leads to fainting.  MEDICATIONS: 1. Claritin 10 mg p.o. q.d. 2. Lopressor 35 mg p.o. b.i.d. 3. Protonix 40 mg p.o. q.d. 4. Premarin 0.625 mg p.o. q.d. 5. Zestril 20 mg p.o. q.d. 6. Tums 500 mg p.o. t.i.d. 7. Zestoretic 20/25 p.o. q.d. 8. Enteric-coated aspirin 325 mg p.o. q.d. 9. Multivitamin one p.o. q.d.  PAST MEDICAL HISTORY:  Please see assessment.  SOCIAL HISTORY:  The patient is married with two grown children and several grandchildren.  She has no history of alcohol or tobacco abuse.  She is a former Associate Professor.  In her retirement she now enjoys crocheting and attending church.  FAMILY HISTORY:  Her mother died at age 29 of Alzheimers.  Father died at age 38 with a cerebral hemorrhage.  REVIEW OF SYSTEMS:  GASTROINTESTINAL:  Negative for weight loss or gain, no dysphagia, no vomiting, nausea as described.  No history of abdominal pain, no diarrhea, no constipation, no hematemesis, no bright red blood per rectum.  No melena, no jaundice, no history of blood transfusions.  PHYSICAL EXAMINATION:  VITAL SIGNS:  Temperature 97.8, blood pressure 125/65, pulse 80, respirations 18, O2 saturation 96% on  room air.  GENERAL:  In general this is a calm, hoarse, elderly woman in no acute distress.  HEENT:  Normocephalic, atraumatic.  Extraocular muscles are intact.  Pupils are equal, round, and reactive to light.  NECK:  Reveals no JVD and no bruits.  CARDIOVASCULAR:  The patient has regular rate and rhythm with a 1/6 systolic murmur.  LUNGS:  The patient has good air movement.  Lungs are  clear.  ABDOMEN:  Soft.  She does have bowel sounds.  Epigastric tenderness is noted without guarding or rebound.  She has slight right and left lower abdominal tenderness on deep palpation.  A well-healed surgical scar is noted transversely from the right lower quadrant crossing midline.  EXTREMITIES:  Reveal no edema.  SKIN:  Shows spider veins of the lower extremities.  RECTAL:  The patient has normal rectal tone.  She is Heme-negative.  No masses are appreciated.  LABORATORY DATA:  Includes hemoglobin 12.1 with a mean cell volume of 84.5, white blood cell count 6.4, platelets 247.  CK 191, MB 6.5/CK 146, MB 4.4, and thirdly, CK 150, MB 5.4.  Troponins 0.03, 0.02, and 0.02.  PT 12.4, INR 0.9, PTT 42.  Sodium 139, potassium 2.9, chloride 101, bicarb 27, BUN 31, creatinine 1.3, glucose 87, total bilirubin 0.7, alkaline phosphatase 187. SGOT 48, SGPT 46, protein 8.4, albumin 3.9, calcium 9.7.  Amylase 133, lipase 74.  Abdominal ultrasound on October 10, 2000 shows the patient to be status post cholecystectomy without intra or extrahepatic ductal dilatation.  A calcification was noted in the pancreas, most consistent with vascular calcification.  Hepatic size was normal.  EKG on admission showed normal sinus rhythm, no evidence of ischemia.  IMPRESSION:  This is a 77 year old woman with atypical chest pain, minimally elevated liver function tests, and amylase and lipase.  Her chest pain may be due to gastroesophageal reflux disease, which would also correlate with her history of hoarseness and an esophagogastroduodenoscopy is recommended. Her elevation in liver function tests may be due to viral hepatitis versus nonalcoholic steatohepatitis versus medications.  Her amylase and lipase elevation is quite minimal and likely insignificant, but may be attributable to her decreased GFR as represented by her creatinine of 1.3.  See recommendations. DD:  10/14/00 TD:  10/14/00 Job:  [id number]

## 2010-08-18 NOTE — Discharge Summary (Signed)
NAMEMarland Kitchen  [name] Luna, [name] Luna NO.:  [id number]   MEDICAL RECORD NO.:  [id number]          PATIENT TYPE:  INP   LOCATION:  2014                         FACILITY:  [facility]   PHYSICIAN:  Charmian Muff, NP        DATE OF BIRTH:  1920-11-04   DATE OF ADMISSION:  12/23/2005  DATE OF DISCHARGE:  12/27/2005                                 DISCHARGE SUMMARY   FINAL DIAGNOSES:  1. Chest pain.  2. History of cardiac catheterization revealing normal coronary arteries.  3. Hypertension.  4. Dyslipidemia.  5. Status post pacemaker placement secondary to complete heart block.  6. History of chronic obstructive pulmonary disease.  7. Macular degeneration.  8. Hallucinations.  9. Fall with head injury.   PROCEDURE:  Left heart catheterization and coronary arteriogram performed on  December 24, 2005 by Dr. Tresa Endo.  This revealed normal LV function.  There  was a smooth, concentric, 60% -70% stenosis in the left anterior descending  artery just after the first diagonal branch, with mild 20% mid left anterior  descending artery narrowing.  Intracoronary nitroglycerin was administered,  and there was no significant improvement.  The circumflex system was normal,  and there was mild 10%-20% Lown irregularity in the right coronary artery.   COMPLICATIONS:  None.   BRIEF HISTORY:  Miss Wardell is a very pleasant, 77 year old white female with  history of nonobstructive coronary artery disease with mid left anterior  descending artery bridging on prior cardiac catheterization.  She presented  to [facility] ER on October 22, 2005 after she awakened with chest discomfort  that was relieved with sublingual nitroglycerin.  She also has a history of:  1. Hypertension.  2. Pacemaker secondary to complete heart block.  3. Dyslipidemia.  4. Chronic obstructive pulmonary disease.  5. Macular degeneration.  6. Hypothyroidism, which is newly diagnosed.  Please see dictated history      and physical for  further details.   HOSPITAL COURSE:  Miss Barman was admitted in stable condition.  Initial  electrocardiograms revealed normal sinus rhythm with nonspecific ST-T wave  changes, and her cardiac enzymes were mildly positive, with troponin of  0.08.  Cardiac catheterization was scheduled for December 24, 2005 to  further evaluate progression of her coronary artery disease, with results as  described above.  She tolerated the procedure without complications.  However, in the early-morning hours Miss Backer attempted to get out of bed  and fell.  She was found on the floor and was bleeding from her head.  She  did not lose consciousness and was awake and alert.  Pupils were equal and  reactive.  Respers were stable.  A CT scan was ordered immediately, as well  as C-spine.  There was evidence of spondylosis and no fracture.  A CT of the  head revealed right basal ganglion infarct, chronic small vessel disease.  No evidence of acute injury.  She was returned to her room and Neuro consult  was obtained.  She had received Ativan earlier in the evening and it was  thought that the fall and head  trauma were secondary to confusion from the  Ativan.  Haldol was recommended for agitation or hallucinations thereafter.  A repeat CT scan was performed, which revealed no acute change, and on  December 27 2005 she was discharged home in stable condition.   LABORATORY DATA:  On the day of discharge her labs were stable.  Total  cholesterol 181, triglycerides 72, HDL 48.3 with a cholesterol-HDL ratio of  4.2, her LDL cholesterol was 124, TSH was 2.8.  EKG revealed normal sinus  rhythm.   DISCHARGE INSTRUCTIONS:  THE PATIENT IS TO AVOID ATIVAN, AND THAT TO HER  LIST OF ADVERSE REACTIONS.  She is to follow a low-fat, low-cholesterol low-  sodium diet, and to avoid strenuous physical activity and lifting for the  next 2 weeks, and I have encouraged her not to drive.  She is to increase  her activity slowly  and may shower and bathe, as well as walk steps with  assistance.  She is to contact us with any problems or questions regarding  her groin site or her head injury.   DISCHARGE MEDICATIONS:  1. Verapamil 240 mg daily.  2. Hydrochlorothiazide 12.5 mg daily.  3. Aspirin 81 mg daily.  4. Zestril 20 mg b.i.d.  5. Synthroid 50 mcg daily  6. Nexium 40 mg daily.  7. Metoprolol 100 mg b.i.d.  8. Crestor 10 mg daily.  9. Sublingual nitroglycerin as directed.   She is to follow up with Dr. Alanda Amass in the next few weeks.  Our office  will contact her with an appointment.  I will also schedule her for a  Persantine Myoview at [facility] prior to her visit with  Dr. Alanda Amass.  She is to call us or report to the emergency department with  any problems or questions, or any change in her symptoms.           ______________________________  Charmian Muff, NP     LS/MEDQ  D:  01/17/2006  T:  01/18/2006  Job:  [id number]   cc:   [facility]  Luis Abed, MD, [facility]

## 2010-08-18 NOTE — Discharge Summary (Signed)
[place]. [facility]  Patient:    [name] Luna                      MRN: [id number] Adm. Date:  [id number] Disc. Date: [id number] Attending:  Darlin Priestly Dictator:   [name]. Annie Paras, F.N.P.C. CC:         Richard A. Alanda Amass, M.D.             Evette Georges, M.D. [facility]                           Discharge Summary  DISCHARGE DIAGNOSES: 1. Chest pain, nonspecific.    a. Negative myocardial infarction.    b. Normal coronary arteries.    c. Hyperdynamic left ventricle function, ejection fraction 65%. 2. Hypertension, controlled. 3. History of complete heart block status post pacemaker insertion. 4. History of gastroesophageal reflux disease. 5. Hypokalemia, resolved.  CONDITION ON DISCHARGE:  Improved.  PROCEDURES:  April 14, 1999:  Combined left heart catheterization by Lennette Bihari, M.D.  DISCHARGE MEDICATIONS: 1. Lopressor 25 mg twice a day. 2. Norvasc 5 mg daily. 3. Altace 10 mg daily. 4. Enteric-coated aspirin 325 mg daily. 5. Premarin 0.625 mg daily. 6. Zantac 75 mg as before. 7. Claritin 10 mg daily. 8. Beconase nasal spray as before. 9. Instead of Zantac use Prevacid 30 mg daily.  DISCHARGE INSTRUCTIONS:  No strenuous activity, no lifting over 10 pounds, no sexual activity for three days, then resume regular activities.  DIET:  Low-salt, low-fat diet.  WOUND CARE:  Wash right groin with soap and water. Call if any bleeding, swelling, or drainage.  FOLLOW-UP:  With Richard A. Alanda Amass, M.D. on Monday as previously instructed.  HISTORY OF PRESENT ILLNESS:  The patient is a 77 year old married white female patient of Richard A. Alanda Amass, M.D. with recent permanent pacemaker implant after episode of syncope and complete heart block on March 14, 1999. She did well postoperatively. History of normal coronaries four years ago. She woke up at 5:30 a.m. on February 11, 1999 from sleep with chest burning substernally and  nausea  associated, also shortness of breath and diaphoresis. She had no radiation of her burning and no emesis. The pain was constant throughout the day, and she presented in the office and was found to have EKG sinus rhythm without acute abnormalities. Blood pressure stable. Chest pain was resolved after three nitroglycerin sprays. Recent episode of sinusitis. No fever. No chills. She was sent to the hospital or admission.  ALLERGIES:  PENICILLIN and CODEINE.  OUTPATIENT MEDICATIONS: 1. Lopressor 25 b.i.d. 2. Norvasc 5 daily. 3. Altace 10 mg daily. 4. Enteric-coated aspirin 325 mg daily. 5. Premarin 0.625 mg daily. 6. Zantac 75 mg daily. 7. Claritin 10 mg daily. 8. Beconase spray.  PAST MEDICAL HISTORY:  Negative, except as described.  PAST SURGICAL HISTORY:  Breast biopsy, right inguinal hernia repair x 2, and a history of abdominal hysterectomy.  SOCIAL HISTORY, REVIEW OF SYSTEMS, FAMILY HISTORY:  See H&P.  PHYSICAL EXAMINATION:  At discharge:  VITAL SIGNS:  Blood pressure 148/82, pulse 80, respirations 20, temperature 96.5, room air oxygen saturation 97%.  GENERAL:  No chest pain. No complaints.  HEART:  S1/S2. Regular rate and rhythm with soft, systolic murmur 1/6.  LUNGS:  Clear.  ABDOMEN:  Positive bowel sounds and soft.  EXTREMITIES:  No edema, 2+ pedal pulses. Right groin without hematoma.  LABORATORY DATA:  Hemoglobin  12.1, hematocrit 35, WBC 6.4, MCV 84, platelets 196. Protime 13.2, INR of 1, PTT 32. Sodium 140, potassium 3.3, chloride 103, CO2 29, glucose 112, BUN 15, creatinine 0.6, calcium 9.4, total protein 7.3, albumin 3.5, SGOT 40, SGPT 30, alkaline phosphatase 164, total bilirubin 0.5, magnesium 1.9,  amylase 109. Repeat potassium after supplementation was 3.6. Cardiac enzymes: K 109 to 99, MB 2.9 to 2.2, troponin I 0.06 to less than 0.03. Lipid panel: Cholesterol 186, triglycerides 104, HDL 56, LDL 109. TSH was 6.99.  Chest x-ray:   Heart remains upper limits of normal. Bibasilar atelectasis or infiltrates have resolved. This is comparison to the March 15, 1999 film. While persistent scarring is noted at the left lung base. No evidence of acute infiltrates or edema. Dual-lead pacemaker has been placed since previous study. No evidence of pneumothorax.  EKG on admission:  Sinus rhythm. Normal EKG. No premature complexes which was on EKG of March 15, 1999.  Cardiac catheterization done on April 14, 1999:  Revealed patent coronary arteries. Normal renal arteries and LV function greater than 65%.  HOSPITAL COURSE:  [name] Luna was admitted April 13, 1999 by Lenise Herald, M.D. He saw her in the office after having chest pain relieved with nitroglycerin spray. She was placed on IV heparin, IV nitroglycerin. Her pain was relieved. Cardiac enzymes were negative. On April 14, 1999, she underwent cardiac catheterization which revealed normal coronaries. She tolerated the procedure well, had her sheath pulled without difficulty, and was discharged on the evening of April 14, 1999. She is to follow up as an outpatient with Richard A. Alanda Amass, M.D. for pacemaker check as she had that placed in December of 2000. It was felt the chest pain was most likely GI related, and Prevacid was added. DD:  05/08/99 TD:  05/08/99 Job: [id number]

## 2010-08-18 NOTE — Op Note (Signed)
[place]. [facility]  Patient:    [name] Luna                      MRN: [id number] Proc. Date: 03/14/99 Adm. Date:  [id number] Attending:  Armanda Magic CC:         Evette Georges, M.D. [facility]             Runell Gess, M.D.             Richard A. Alanda Amass, M.D.             [facility]             The Record Room                           Operative Report  PROCEDURE:  Temporary pacemaker insertion.  BRIEF HISTORY (Please refer to H&P):  [name] is a 77 year old white, married,  mother of two children.  She is a nonsmoker.  She has a prior history of normal  coronary arteries remotely on catheterization.  She has a history of hypertension, taking low-dose Lopressor.  Today she had sudden onset of frank syncope at home while sitting at lunch. This was characterized by her eyes rolling back in her head and loss of consciousness. When she awakened, she had nausea by emesis x 1 and a bifrontal headache.  She as brought to the emergency room, where she had intermittent complete heart block nd 13 s of asystole not associated with nausea or vomiting.  She then came back in  sinus tachycardia.  A head CT did not show any acute intracerebral bleed or initially any CVA.  She came back to the emergency room, had recurrent intermittent heart block.  She went back into sinus tachycardia again, and systolic pressures were elevated to 190-200 mmHg.  She had a chest CT, which was negative for aortic dissection as well.  She was brought to the laboratory for a temporary transvenous pacemaker insertion.  Her creatinine was 0.6, normal BUN, normal CBC, diff, and prothrombin time.  DESCRIPTION OF PROCEDURE:  The left anterior chest was prepped and draped in the usual manner.  Then 1% Xylocaine was used for local anesthesia.  The patient was given 4 mg of Zofran for nausea, and she was given 2 mg of Nubain for headache nd sedation.  The left  subclavian vein was entered with a single anterior puncture  using a ______ thin-walled needle, and a #6 [place] short sheath was inserted over a stainless steel J-tipped guide wire under fluoroscopic control.  A temporary transvenous pacemaker was then inserted into the ______ under fluoroscopic control.  Pacer testing showed full capture down to MA 0.5.  The patient was started on a Nipride drip in the laboratory, and back up pacing was instituted t 50/min.  However, she was overriding this at 90-100 with sinus tachycardia. She was given 5 mg of Lopressor IV in the laboratory, and Nipride drip was titrated for blood pressure control.  The side arm sheath was flushed.  The temporary pacemaker was secured at the insertion site and dressed.  There was no pneumothorax on fluoroscopy.  There was normal sensing of her pacemaker.  The patient was brought to the holding area for postoperative care, awaiting a critical care bed.  Further laboratories are pending.  We plan to institute blood pressure control ow with medical therapy.  It is  likely that she will require a permanent transvenous pacemaker based on her available history at present.  It seems that her sudden syncopal episode may have been due to heart block with asystole (Stokes-Adams), and that her exacerbation of her hypertension may be a secondary phenomenon. Formal rebuilds of her CT are also pending.  DD:  03/14/99 TD:  03/15/99 Job: [id number]

## 2010-08-18 NOTE — Discharge Summary (Signed)
NAMEMarland Luna  SYRAI, GLADWIN NO.:  [id number]   MEDICAL RECORD NO.:  [id number]          PATIENT TYPE:  INP   LOCATION:  4741                         FACILITY:  [facility]   PHYSICIAN:  Nanetta Batty, M.D.   DATE OF BIRTH:  Jan 10, 1921   DATE OF ADMISSION:  12/28/2005  DATE OF DISCHARGE:  12/31/2005                                 DISCHARGE SUMMARY   The patient was admitted secondary to an episode of chest pain.  She  awakened about 3 a.m.  She had midsternal chest pain 7/10 with associated  nausea and shortness of breath.  She used three sublingual nitroglycerin  with relief and the EMS was called.  On arrival, her pain was a 5/10.  Her  EKG showed normal sinus rhythm with PVCs and no acute changes.  Her initial  cardiac markers were negative.  Her potassium was low at 3.3.  She had just  been discharged from the hospital on 12/27/2005 after being hospitalized for  an episode of chest pain.  She underwent cardiac catheterization.  This was  performed by Dr. Tresa Endo.  It revealed a 60-70% LAD lesion and some 10-20%  lesions in her RCA.  He suggested she be discharged on nitrates and a  followup Persantine to be done as an outpatient.  She was sent home on  increased beta blocker.  It was decided this admission to do a Persantine  Cardiolite and adenosine Cardiolite was done 12/31/2005 by myself.  It was  negative for any ischemia with a normal EF.  To note, on admission she had  been ordered metoprolol 125 mg b.i.d.  She was taken off as 12.5 mg b.i.d.  thus this is all she was receiving.  On the night of discharge she was given  75 mg of metoprolol and told to go back to her regular dose of metoprolol  100 mg twice per day the following morning.  We did add Imdur 30 mg every  day this admission and also K-Dur.   CURRENT MEDICATIONS:  Aspirin 81 mg one time per day, lisinopril or Zestril  20 mg twice per day, hydrochlorothiazide 12.5 mg once per day, K-Dur 20 mEq  once per  day, Synthroid 50 mcg one time per day, Crestor 10 mg one time per  day, Nexium 40 mg twice per day, verapamil 240 mg once per day, Precision  vitamin one time per day, Colace as needed for stool softener, metoprolol  100 mg twice per day, and Imdur 30 mg one time per day.   She was seen by GI while she was here and she should follow up with Dr. Elnoria Howard  in the next 2-4 weeks.  He also recommended her Nexium to be increased to  twice per day.   LABORATORIES:  T3 was 131.2, T4 9.7, TSH 4.307.  Sodium was 141, potassium  3.7, BUN 13, creatinine 0.8.  CK-MBs and troponins were all negative.  Hemoglobin 10.9, hematocrit 31.6, WBC 6, and platelets 186.  Calcium was  8.8.  As stated on admission, her potassium was low at 3.3.  This was  replaced.  On 12/31/2005, she was seen by GI, Dr. Elnoria Howard, and he recommended  that if her serologies were negative that Paget disease needed to be  considered.   DISCHARGE DIAGNOSES:  1. Noncardiac chest pain with negative adenosine Cardiolite this      admission.  2. Gastroesophageal reflux disease.  Recommendations were that she be      treated with b.i.d. Nexium for a month      and her symptoms would be reassessed at that time.  If her chest pain      persists then she will need an EGD.  3. Nonobstructive coronary artery disease.  4. __________.      Lezlie Octave, N.P.      Nanetta Batty, M.D.  Electronically Signed    BB/MEDQ  D:  12/31/2005  T:  01/01/2006  Job:  [id number]

## 2010-08-18 NOTE — Cardiovascular Report (Signed)
NAME:  KHADEEJA, ELDEN.:  [id number]   MEDICAL RECORD NO.:  [id number]          PATIENT TYPE:  INP   LOCATION:  2921                         FACILITY:  [facility]   PHYSICIAN:  Nicki Guadalajara, M.D.     DATE OF BIRTH:  1921-02-12   DATE OF PROCEDURE:  12/24/2005  DATE OF DISCHARGE:                              CARDIAC CATHETERIZATION   INDICATIONS:  Ms. Noelene Gang is an 77 year old female who has a history  of suggestion of mild systolic mid-LAD bridging on prior catheterizations.  She presented to [facility] on December 23, 2005 after awakening  with chest pain without diaphoresis or shortness of breath.  Her chest pain  improved with nitroglycerin.  Troponins have been borderline positive at  0.8.  CPK enzymes were negative.  She also has history of hypertension,  hyperlipidemia, status post permanent pacemaker.  She now presents for  definitive repeat cardiac catheterization.   PROCEDURE:  After premedication with Valium 4 mg intravenously, the patient  was prepped and draped in the usual fashion.  Her right femoral artery was  punctured anteriorly and a 5-French sheath was inserted without difficulty.  Diagnostic catheterization was done with 5-French Judkins 4 left and right  coronary catheters.  A 200 mcg of intracoronary nitroglycerin was  administered selectively into the left coronary system with repeat imaging  to see if the proximal LAD lesion was related to spasm.  A 5-French pigtail  catheter was used for biplane cine left ventriculography as well as distal  aortography.  The patient tolerated this procedure well.  Hemostasis was  obtained by direct manual pressure.   HEMODYNAMIC DATA:  Central aortic pressure was 170/17, mean 114.  Left  ventricular pressure is 170/15, post A-wave 20.   ANGIOGRAPHIC DATA:  Left main coronary artery was angiographically normal  and bifurcated into LAD and left circumflex system.   The LAD had a proximal  bend in the vessel in the chest after the very  proximal first diagonal vessel and in the region of the first septal  perforating artery.  There was eccentric, smooth 60-70% narrowing at this  site in the LAD just after the diagonal takeoff.  The mid-LAD had a smooth  area of 20% narrowing.  The remainder of the LAD was free of significant  disease.   The circumflex vessel was angiographically normal.   Right coronary artery had smooth 10-20% irregularity in its mid-segment and  also distally.  The vessel gave rise to a PDA, inferior LV branch and a  large posterolateral system.   Biplane cine left ventriculography revealed normal LV contractility without  focal segmental wall motion abnormalities.   Distal aortography did not reveal any renal artery stenosis.  There was no  significant iliac disease.   IMPRESSION:  1. Normal left ventricular function.  2. Smooth eccentric 60-70% narrowing in the left anterior descending      artery just after the first diagonal vessel in the region of the first      septal perforator takeoff with mild 20% mid-left anterior descending      artery narrowing,  not significantly improved following intracoronary      nitroglycerin administration.  3. Normal left circumflex system.  4. Mild 10-20% luminal irregularity involving the right coronary artery.   RECOMMENDATIONS:  Ms. Weisensel has catheterization demonstration of smooth 60-  70% narrowing in the left anterior descending artery on a proximal bend in  the vessel just after the first diagonal and in the region of the septal  perforator takeoff.  She presented early morning with chest pain which may  be secondary to increased early-morning vasomotor spasm contributing to her  spasm on fixed obstructive disease leading to her borderline elevated  troponin with negative CPK enzymes.  At present, she has TIMI III flow.  The  lesion is smooth.  She will be started on increased nitrates as well as   increased calcium channel blockade therapy with plans for ultimate  Persantine-Myoview study on increased medical therapy to make certain she  does not have LAD ischemia.  If there is no ischemia, continued medical  therapy will be recommended.  However, if significant ischemia is present in  the LAD territory, she will then have objective evidence for percutaneous  coronary intervention to be performed in this lesion.           ______________________________  Nicki Guadalajara, M.D.     TK/MEDQ  D:  12/24/2005  T:  12/26/2005  Job:  [id number]   cc:   Gerlene Burdock A. Alanda Amass, M.D.  Sherian Rein A. Tawanna Cooler, MD

## 2010-08-18 NOTE — Op Note (Signed)
NAME:  SYD, MANGES NO.:  [id number]   MEDICAL RECORD NO.:  [id number]                   PATIENT TYPE:  INP   LOCATION:  2550                                 FACILITY:  [facility]   PHYSICIAN:  Etter Sjogren, M.D.                  DATE OF BIRTH:  1920-07-23   DATE OF PROCEDURE:  10/01/2003  DATE OF DISCHARGE:  10/01/2003                                 OPERATIVE REPORT   PREOPERATIVE DIAGNOSIS:  Complicated open wound of the right leg with  hypertrophic granulation, greater than 2 cm in diameter.   POSTOPERATIVE DIAGNOSES:  1. Complicated open wound of the right leg with hypertrophic granulation,     greater than 2 cm in diameter.  2. Complicated open wound of the leg, greater than 5 cm.   PROCEDURES PERFORMED:  1. Excision of wound and hypertrophic lesion, greater than 2.0 cm, of the     right leg.  2. Complex wound closure, right leg, greater than 5.0 cm.   SURGEON:  Etter Sjogren, M.D.   ANESTHESIA:  MAC with 1% Xylocaine with epinephrine plus NEUT.   CLINICAL NOTE:  This 77 year old woman has an area on her right leg that has  failed to heal.  This has been excised previously by her doctor.  She has  been left with a hypertrophic lesion and this has not healed.  She is  scheduled for an excision today and possible skin grafting.  The nature of  the procedure and risks were understood by her, and she wished to proceed.   DESCRIPTION OF PROCEDURE:  The patient was taken to the operating room and  placed supine.  After satisfactory sedation, she was prepped with Betadine  and draped with sterile drapes.  After a satisfactory level of anesthesia  was achieved the lesion was excised, taking a generous margin of normal  tissue around the lesion.  Thorough irrigation with saline and meticulous  hemostasis with the electrocautery.  The specimen was removed from the table  as a permanent specimen.  The wound edges were then advanced together using  2-0  Monocryl interrupted inverted deep sutures and 2-0 Monocryl interrupted  inverted deep dermals and 2-0 nylon interrupted simple and vertical mattress  sutures as needed.  Antibiotic ointment, Adaptic, and ABD with an Ace wrap  applied, and she tolerated the procedure well.  It was felt that since this  wound could close primarily that there was no need for any skin grafting  procedure today.                                               Etter Sjogren, M.D.    DB/MEDQ  D:  10/01/2003  T:  10/02/2003  Job:  [id number]

## 2010-08-18 NOTE — Discharge Summary (Signed)
[place]. [facility]  Patient:    [name] Luna, [name] Luna                     MRN: [id number] Adm. Date:  [id number] Disc. Date: [id number] Attending:  Dorena Cookey CC:         Evette Georges, M.D. [facility]  Jyothi Elsie Amis, M.D.   Discharge Summary  DISCHARGE DIAGNOSES: 1. Chronic pancreatitis. 2. Hypertension, well-controlled. 3. Hypothyroidism. 4. Mild gastritis. 5. Chest pain, noncardiac etiology.  DISPOSITION:  The patient had extensive evaluation including EGD in the hospital on October 15, 2000, by Dr. Loreta Ave, which revealed mild gastritis.  She also had an abdominal CAT scan on October 15, 2000, which also revealed evidence of pancreatic calcification.  She remained asymptomatic after conservative asymptomatic treatment.  As she has remained stable, she is deemed to be discharge.  HOSPITAL COURSE:  [name] Luna is a 76 year old female with past medical history of symptomatic bradycardia, status post pacemaker implantation and also negative cardiac catheterization in 2001.  She has history of hiatal hernia and hypertension.  She was admitted to the hospital on October 12, 2000, with chest pain and epigastric pain.  This had been going on for the past one week. On admission, her total CPK was normal, however, the MB fraction was elevated with elevated index.  Her troponin was negative all the time.  Her serum amylase was 133 and lipase was 74, which were both increased.  Given her increased lipase, amylase, SGPT and SGOT, a GI consultation with Dr. Loreta Ave was obtained.  At this time, initially the patient was scheduled for a cardiac catheterization.  However, given a normal cardiac catheterization just a year ago and a normal stress test that was performed on October 11, 2000, the cardiac catheterization was cancelled.  Her chest pain and epigastric pain was considered to be of noncardiac etiology.  As the patient has remained stable, she is deemed to be discharged  home.  PROCEDURES: 1. Cardiac catheterization one year ago which was normal. 2. Adenosine Cardiolite stress test on October 11, 2000, which was normal with no    evidence of ischemia. 3. Esophagogastroduodenoscopy on October 15, 2000, which showed gastritis. 4. Computed tomography of the abdomen on October 15, 2000, showed calcification of the pancreas consistent with chronic pancreatitis.  DISCHARGE MEDICATIONS: 1. Claritin 10 mg p.o. q.d. 2. Calcium carbonate 500 mg p.o. t.i.d. 3. Lisinopril 20 mg p.o. q.d. 4. Aspirin 325 mg p.o. q.d. 5. Metoprolol 75 mg p.o. b.i.d. 6. Premarin 0.625 mg p.o. q.d. 7. Potassium chloride 20 mEq p.o. q.d.  FOLLOWUP:  Follow up with Dr. Tawanna Cooler in four weeks time.  Follow up with Dr. Loreta Ave in about two to three weeks time. DD:  10/17/00 TD:  10/18/00 Job: [id number]

## 2010-08-18 NOTE — H&P (Signed)
NAME:  [name] Luna, [name] Luna NO.:  [id number]   MEDICAL RECORD NO.:  [id number]                   PATIENT TYPE:  OBV   LOCATION:  6527                                 FACILITY:  [facility]   PHYSICIAN:  Quita Skye. Waldon Reining, MD             DATE OF BIRTH:  19-Mar-1921   DATE OF ADMISSION:  11/26/2002  DATE OF DISCHARGE:  11/27/2002                                HISTORY & PHYSICAL   HISTORY OF PRESENT ILLNESS:  [name] Luna is a 77 year old white woman who  is admitted to [facility] for further evaluation of chest pain.   The patient has complaints of chest pain in the past. She underwent cardiac  catheterization in January of 2001 which demonstrated normal coronary  arteries.  Her left ventricular function was normal.  She was hospitalized  here in March of this year with chest pain.  An acute myocardial infarction  was excluded, and she was discharged with plans to undergo a dipyridamole  Cardiolite study as an outpatient.  The results of that test are not known.   The patient presented to the emergency department after experiencing an  episode of chest pain which awakened her from sleep.  The chest pain is  described as a vague pressure in the center of her chest associated with  palpitations (sensation of a strong heart beat).  It was associated with  tingling in her forearms and hands.  There was slight dyspnea but no  diaphoresis or nausea.  There were no exacerbating or ameliorating factors.  It appeared not to be related to position, activity, meals or respirations.  It resolved spontaneously within approximately 30 minutes.  She is  asymptomatic now in the emergency department.   There is no history of congestive heart failure.   The patient underwent permanent pacemaker placement in December of 2000 due  to complete heart block and syncope.   She has not experienced any dizziness, lightheadedness, syncope, or near-  syncope.  Nor has she  experienced a rapid or irregular heart beat.   The patient's other medical problems include hypertension, gastroesophageal  reflux, and macular degeneration.   CURRENT MEDICATIONS:  1. Zestoretic 20/25 mg p.o. daily.  2. Norvasc 5 mg p.o. daily.  3. Metoprolol 75 mg p.o. daily.  4. Ranitidine 300 mg p.o. daily.  5. Aspirin 81 mg p.o. daily.   ALLERGIES:  She is reportedly allergic to PENICILLIN and CODEINE.   SOCIAL HISTORY:  The patient lives with her husband.  She has two children.  She does not drive due to her poor vision.  She neither smokes nor drinks.   FAMILY HISTORY:  Family history is noncontributory.   REVIEW OF SYSTEMS:  Review of systems reveals no new problems related to her  head, eyes, ears, nose, mouth, throat, lungs, gastrointestinal system,  genitourinary system, or extremities.  There is no history of neurologic or  psychiatric disorder.  There is no history of fever, chills, or weight loss.   PHYSICAL EXAMINATION:  VITAL SIGNS:  Blood pressure 178/79.  Pulse 102 and  regular.  Respirations 20.  Temperature 97.7.  GENERAL:  The patient was an elderly white woman in no discomfort.  She was  alert, oriented, appropriate, and responsive.  HEENT:  Head, eyes, ears, nose, and mouth were normal.  NECK:  The neck was without thyromegaly or adenopathy.  Carotid pulses were  palpable bilaterally and without bruits.  CARDIAC:  Examination revealed a normal S1 and S2.  There was no S3, S4,  murmur, rub, or click.  Cardiac rhythm was regular.  No chest wall  tenderness was noted.  LUNGS:  The lungs were clear.  ABDOMEN:  The abdomen was soft and nontender.  There was no mass,  hepatosplenomegaly, bruit, distention, rebound, guarding or rigidity.  Bowel  sounds were normal.  BREASTS, PELVIS AND RECTAL:  Examinations were not performed as they were  not pertinent to the acute care hospitalization.  EXTREMITIES:  The extremities were without edema, deviation, or  deformity.  Radial and dorsalis pedal pulses were palpable bilaterally.  NEUROLOGIC:  Brief screening neurologic survey was unremarkable.   LABORATORY AND ACCESSORY CLINICAL DATA:  Electrocardiogram revealed normal  sinus rhythm and left atrial enlargement.  It was otherwise unremarkable.   The chest radiograph was pending at the time of this dictation.   The initial CK-MB was 3.7 with a myoglobin of 379 and troponin of less than  0.05.  The second set revealed a CK-MB of 5.5, myoglobin 470, and troponin  0.06.  Potassium is 3.2, BUN 29, and creatinine 0.9.  White count was 6.4  with a hemoglobin of 10.8 and hematocrit of 32.0.  The remaining laboratory  studies were pending at the time of this dictation.   IMPRESSION:  1. Chest pain, rule out cardiac ischemia.  No coronary artery disease by     cardiac catheterization in 2001.  She was last admitted for chest pain in     March 2004.  2. Permanent pacemaker for sick sinus syndrome.  3. Hypertension.  4. History of recurrent shingles.   PLAN:  1. Telemetry.  2. Serial cardiac enzymes.  3. Aspirin.  4. Nitrol paste.  5.     Lovenox.  6. Additional evaluation per Dr. Pearletha Furl. Alanda Amass.   The patient's husband and son were present throughout the patient encounter.                                                Quita Skye. Waldon Reining, MD    MSC/MEDQ  D:  11/26/2002  T:  11/26/2002  Job:  [id number]   cc:   Jaclyn Prime. Lucas Mallow, M.D.  [address]  [place]  [place] [address]  Fax: [phone]

## 2010-08-18 NOTE — Cardiovascular Report (Signed)
NAME:  [name] Luna, [name] Luna NO.:  [id number]   MEDICAL RECORD NO.:  [id number]                   PATIENT TYPE:  OBV   LOCATION:  2006                                 FACILITY:  [facility]   PHYSICIAN:  Madaline Savage, M.D.             DATE OF BIRTH:  05/27/1920   DATE OF PROCEDURE:  11/27/2002  DATE OF DISCHARGE:                              CARDIAC CATHETERIZATION   PROCEDURES PERFORMED:  1. Selective coronary angiography by Judkins technique.  2. Retrograde left heart catheterization.  3. Left ventricular angiography.   ENTRY SITE:  Right femoral.   DYE USED:  Omnipaque.   COMPLICATIONS:  None.   PATIENT PROFILE:  The patient is an 77 year old lady who was a patient of  Richard A. Alanda Amass, M.D. who underwent temporary pacemaker implantation in  the year 2000 and who has had several catheterizations in the last 13 or 14  years that have shown clean coronaries.  She recently entered the hospital  with chest pain and was brought to the catheterization laboratory today  electively for catheterization to evaluate her current symptoms.   RESULTS:  PRESSURES:  The left ventricular pressure was 150/2, end-diastolic  pressure 8.  Central aortic pressure was 150/65, mean of 100.  No aortic  valve gradient by pullback technique.   ANGIOGRAPHIC RESULTS:  1. The left main coronary artery was normal.  2. The LAD and circumflex were also normal.  The circumflex is basically a     nondominant vessel with one obtuse marginal branch arising proximally.     The LAD is tortuous in the distal two-thirds of the vessel.  There is     noted to be area of systolic bridging about midway down the LAD.  3. The right coronary artery is a huge, dominant vessel giving rise to a     bifurcating posterolateral branch and a posterior descending branch.  No     lesions were seen.   LEFT VENTRICULAR ANGIOGRAPHY:  Left ventricular angiography was complicated  by ventricular  tachycardia that was catheter induced.  There was excellent  contractility of the ventricle without any wall motion abnormalities and no  mitral regurgitation.  Aortic root and first portion of ascending aorta  looked normal.    FINAL DIAGNOSES:  1. Angiographically patent coronary arteries in a right coronary dominant     system.  2. Normal left ventricular systolic function.                                               Madaline Savage, M.D.    WHG/MEDQ  D:  11/27/2002  T:  11/27/2002  Job:  [id number]   cc:   Gerlene Burdock A. Alanda Amass, M.D.  [phone] [address]  [place]  [place] [address]  Fax:  [phone]   Cath Lab

## 2010-08-18 NOTE — Discharge Summary (Signed)
NAME:  [name] Luna, URE              ACCOUNT NO.:  [id number]   MEDICAL RECORD NO.:  [id number]          PATIENT TYPE:  INP   LOCATION:  4709                         FACILITY:  [facility]   PHYSICIAN:  Richard A. Alanda Amass, M.D.DATE OF BIRTH:  02-10-1921   DATE OF ADMISSION:  12/24/2003  DATE OF DISCHARGE:  12/25/2003                                 DISCHARGE SUMMARY   DISCHARGE DIAGNOSES:  1.  Tachycardia.  2.  Trigeminy PVCs.  3.  Chest pain.  4.  History of normal course and some bridging.  5.  Anxiety.  6.  Hypokalemia, replaced.  7.  Hypertension.  8.  Permanent pacemaker, secondary to complete heart block.  9.  Chronic obstructive pulmonary disease.  10. Normal liver function tests.   CONDITION ON DISCHARGE:  Stable.   DISCHARGE MEDICATIONS:  1.  Stop the Norvasc.  2.  Verapamil SR 180 mg daily.  3.  Metoprolol 50 mg, 1-1/2 tablets twice a day.  4.  Enteric-coated aspirin, 81 mg daily.  5.  Tums, 500 mg, three times a day.  6.  Nexium 40 mg daily.  7.  Multivitamin daily.  8.  Ocuvite two twice a day as before.  9.  Tavist as needed.   DISCHARGE INSTRUCTIONS:  1.  Activity as tolerated.  2.  Low-fat, low-salt diet.  3.  Have blood work drawn on Monday.  4.  Office will call you for scheduled bone scan for abnormal labs.  5.  Follow up with Dr. Alanda Amass in two to four weeks, office will call you      Monday with day and time.   HISTORY OF PRESENT ILLNESS:  An 77 year old female patient of Dr. Alanda Amass  with history of multiple catheterizations over the past 14 years, all normal  heart catheterizations.  The last was done on November 27, 2002, normal  coronaries, normal left ventricle.  She also has a history of permanent  pacemaker placement in December of 2000 secondary to complete heart block,  some asystole and syncope.  Other history includes hypertension and  hyperlipidemia.   On December 24, 2003, she was awakened at 5:30 in the morning.  Her heart  was  beating very fast.  She went to the bathroom.  Her heart rate continued  to race.  She got a little diaphoretic and 911 was called.  She did feel a  little lightheaded, but no syncope.  She also had some heaviness  substernally that felt secondary to the racing.  She had no nausea, but  increased shortness of breath.  She stated once she got in the ambulance,  her heart rate improved, and no further complaints of heart racing.  When  she got to the emergency room, she did have occasional flutter beats.   While in the EMS, she had PVCs and was given lidocaine, and they make no  mention of tachycardia in their note.  She did have up to three beats of  ventricular tachycardia on her monitor.   She was admitted to [facility] to rule out myocardial infarction.  She was also hypokalemic with  a potassium of 3.2 which may have affected her  PVCs.   PHYSICAL EXAMINATION:  VITAL SIGNS:  Blood pressure 153/78, pulse 103,  respirations 20, temperature 97.5, oxygen saturation 97% on room air.  HEART:  S1, S2, slightly rapid.  LUNGS:  Decreased breath sounds.  Chest x-ray had shown chronic obstructive  pulmonary disease.  ABDOMEN:  Positive bowel sounds, soft, nontender.  EXTREMITIES:  Without edema.   LABORATORY DATA:  Hemoglobin 12, hematocrit 35.5, WBC 5.3, platelets  233,000, neutrophils 46, lymphocytes 37, monos 12, eosinophils 5, basos 0.  Prothrombin time 12.5, INR 0.9.   Chemistries:  Sodium 140, potassium 3.2, chloride 106, glucose 117, BUN 13,  creatinine 1.0, calcium 8.8.  Total protein 6.9, albumin 3.4.  AST 42.  ALT  33.  Alkaline phosphatase 367,000.  Total bilirubin 0.8, magnesium 2.0.  Prior to discharge, potassium had dropped to 3.3.  She was replaced as well  as placed on potassium.  Glucose was 100 at discharge.   Chest x-ray, stable cardiomegaly and changes of chronic obstructive  pulmonary disease.  No acute abnormality.   On December 24, 2003, she was admitted.   Sinus rhythm with PVC's.  There  was some ST abnormality.  Followup on the 24th, sinus rhythm, ST  abnormalities.  PVCs had resolved.   HOSPITAL COURSE:  Ms. Belgarde was admitted on December 24, 2003, with  tachycardia, frequent PVCs, chest pressure, hypokalemia and hypertension.  Medications were adjusted.  She was admitted overnight.  The next day, CK-  MBs were negative for myocardial infarction and range CK 127-129.  MBs 3.8  and 4.3.  Troponin I peak was 0.07.   TSH was slightly elevated at 5.542.   During the night between the 23rd and 24th, she was having significant  anxiety.  Her roommate had talked all night, and she was very stressed by  the next morning.  Dr. Jacinto Halim saw her on rounds, and plans were to keep her  in the hospital over one night and discharge her on the 25th, but  unfortunately the patient got more anxious and asked to go home today.  Dr.  Jacinto Halim agreed.  She had had one episode where her heart rate went up to 140  when her roommate  climbed out of the bed and fell on to her bed.  She was given Ativan at that  point.  She was transferred to another room that morning, but she still did  not want to stay in the hospital.  She will follow up as an outpatient with  Dr. Alanda Amass and have a bone scan for that elevated alkaline phosphatase  level.       LRI/MEDQ  D:  01/25/2004  T:  01/25/2004  Job:  [id number]   cc:   Gerlene Burdock A. Alanda Amass, M.D.  [phone] [address]  [place]  [place] [address]  Fax: [phone]   Eugenio Hoes. Tawanna Cooler, M.D. [facility]   Earl Lites D. Marina Goodell, M.D.  [address]  [place]  [place] [address]  Fax: [phone]

## 2010-08-18 NOTE — Procedures (Signed)
[place]. [facility]  Patient:    [name] Luna, [facility] Visit Number: [id number] MRN: [id number]          Service Type: END Location: ENDO Attending Physician:  Charna Elizabeth Dictated by:   Anselmo Rod, M.D. Proc. Date: 03/19/01 Admit Date:  03/19/2001   CC:         Evette Georges, M.D. [facility]  Runell Gess, M.D.   Procedure Report  DATE OF BIRTH:  06-02-1920  REFERRING PHYSICIAN:  Evette Georges, M.D. [facility]  PROCEDURE PERFORMED:  Colonoscopy.  ENDOSCOPIST:  Anselmo Rod, M.D.  INSTRUMENT USED:  Olympus video colonoscope (pediatric).  INDICATIONS FOR PROCEDURE:  History of iron deficiency anemia in a 77 year old white female.  Rule out colonic polyps, masses, hemorrhoids, etc.  PREPROCEDURE PREPARATION:  Informed consent was procured from the patient. The patient was fasted for eight hours prior to the procedure and prepped with a bottle of magnesium citrate and a gallon of NuLytely the night prior to the procedure.  PREPROCEDURE PHYSICAL:  The patient had stable vital signs.  Neck supple. Chest clear to auscultation.  S1, S2 regular.  Abdomen soft with normal abdominal bowel sounds.  DESCRIPTION OF PROCEDURE:  The patient was placed in the left lateral decubitus position and sedated with 60 mg of Demerol and 6 mg of Versed intravenously.  Once the patient was adequately sedated and maintained on low-flow oxygen and continuous cardiac monitoring, the Olympus video colonoscope was advanced from the rectum to the cecum without difficulty. The patient had no evidence of masses, polyps, erosions, ulcerations, diverticulosis or hemorrhoids.  IMPRESSION:  Normal colonoscopy.  RECOMMENDATIONS:  The patient is to continue her pancreatic supplements and follow up in the office in the next four weeks.  Further recommendations will be made at that time.Dictated by:   Anselmo Rod, M.D. Attending Physician:  Charna Elizabeth DD:   03/19/01 TD:  03/20/01 Job: [id number]

## 2010-08-18 NOTE — Discharge Summary (Signed)
[place]. [facility]  Patient:    [name] Luna, [name] Luna Visit Number: [id number] MRN: [id number]          Service Type: Attending:  Pearletha Furl. Alanda Amass, M.D. Dictated by:   Abelino Derrick, P.A.C. Adm. Date:  01/23/01 Disc. Date: 01/24/01   CC:         Evette Georges, M.D. [facility]             Jyothi Elsie Amis, M.D.             -                  Referring Physician Discharge [place]  DISCHARGE DIAGNOSES: 1. Chest pain, noncardiac. 2. Hypertension, under better control at discharge. 3. Abdominal pain with mildly elevated alkaline phosphatase. 4. Past history of pancreatitis. 5. Reflux. 6. Normal coronaries with normal left ventricular function by catheterization    January 2001. 7. History of Medtronic DDD pacemaker December 2000. 8. Negative Cardiolite study July 2002. 9. PENICILLIN allergy.  HOSPITAL COURSE:  Patient is a 77 year old female followed by Dr. Tawanna Cooler, Dr. Loreta Ave, and Dr. Alanda Amass.  She has a history of sick sinus syndrome and has a pacemaker implanted as noted above.  There is no prior history of coronary disease.  She has had chest pain in the past but, as noted, had catheterization January 2001 showing normal coronaries with normal LV function.  Follow-up Cardiolite in July 2002 was negative for ischemia.  She presented to the emergency room January 23, 2001 after she was awakened about 2 a.m. with chest pressure and nausea.  She arrived by EMS.  She was admitted to telemetry; enzymes were obtained.  She was put on nitroglycerin, heparin, and aspirin and beta blocker were continued.  She also complained of some abdominal discomfort after admission, which was diffuse and general.  Enzymes were negative for an MI.  She does have a past history of chronic pancreatitis.  Amylase and lipase were obtained.  Her alkaline phosphatase was somewhat high at 204; her SGOT was 44.  Lipase was 44, amylase 94.  White count 5.8.  Dr. Loreta Ave saw her before  discharge and a follow-up lipase was obtained which was also within normal limits.  Dr. Loreta Ave will see her in the office Monday at 11 a.m.  From a cardiac standpoint, we feel she can be discharged.  We did had Norvasc for hypertension.  DISCHARGE MEDICATIONS: 1. Norvasc 5 mg a day. 2. Lisinopril 20 mg a day. 3. Premarin 0.625 q.d. 4. Lopressor 50 mg in the morning, 25 at night. 5. Zantac as taken at home.  LABORATORY DATA:  Sodium 140, potassium 3.7, BUN 15, creatinine 0.8.  Liver functions are as noted above.  Lipase 44, amylase 94.  CK 166, MB 5.4.  White count 5.8, hemoglobin 10.9, hematocrit 32.4, platelets 252.  Chest x-ray shows no evidence of active disease.  EKG shows sinus rhythm, PVCs, nonspecific ST changes.  DISPOSITION:  Patient was discharged in stable condition and will follow up at Dr. Mancel Parsons office with the P.A. in a couple of weeks.  She will see Dr. Loreta Ave on Monday.  She has an appointment with Dr. Tawanna Cooler in December and will keep this. Dictated by:   Abelino Derrick, P.A.C. Attending:  Pearletha Furl. Alanda Amass, M.D. DD:  01/24/01 TD:  01/24/01 Job: [id number]

## 2010-08-18 NOTE — Cardiovascular Report (Signed)
[place]. [facility]  Patient:    [name] Luna                      MRN: [id number] Proc. Date: 04/14/99 Adm. Date:  [id number] Attending:  Darlin Priestly CC:         Richard A. Alanda Amass, M.D.             Evette Georges, M.D. [facility]             Leona Patterson-Office [facility]                        Cardiac Catheterization  INDICATIONS:  [name] is a 77 year old white female, patient of Dr. Alanda Amass.  She is status post permanent pacemaker insertion for syncope, resulting from complete heart block in December 2000.  The patient had been doing well. Yesterday, she was awakened from sleep with chest burning substernally, associated with nausea, shortness of breath and diaphoresis.  She was admitted to [facility] with a presumptive diagnosis of possible unstable angina.   She is now  referred for definitive diagnostic cardiac catheterization.  HEMODYNAMIC DATA: 1. Central aortic pressure:  181/77. 2. Left ventricular pressure:  181/22.  ANGIOGRAPHIC DATA:  The left main coronary artery was angiographically normal.  This trifurcated into an LAD, intermediate system and left circumflex coronary artery.  The left anterior descending coronary artery was angiographically normal.  Gave  rise to two major diagonal vessels and several small septal perforating arteries.  The intermediate vessel was angiographically normal.  The circumflex vessel was angiographically normal and gave rise to a major marginal vessel.  The right coronary artery was a large dominant vessel, that gave rise to a PDA, an inferior LV branch and a very large posterolateral system (which extended all the way to the LV apex).  LEFT VENTRICULOGRAPHY:  RAO ventriculography revealed hyperdynamic contractility, with an ejection fraction of at least 65%.  There was ectopy during the injection. Due to the hyperdynamic LV it was very difficult to  record pressures without some ectopy.  DISTAL AORTOGRAPHY:  Did not demonstrate any renal artery stenosis.   There was no significant aortoiliac disease.  IMPRESSION: 1. Hyperdynamic left ventricular function. 2. Normal coronary arteries. 3. No evidence for renal artery stenosis. DD:  04/14/99 TD:  04/14/99 Job: [id number]

## 2010-08-18 NOTE — Discharge Summary (Signed)
   NAME:  [name] Luna, [name] Luna NO.:  [id number]   MEDICAL RECORD NO.:  [id number]                   PATIENT TYPE:  OBV   LOCATION:  2006                                 FACILITY:  [facility]   PHYSICIAN:  Richard A. Alanda Amass, M.D.          DATE OF BIRTH:  November 19, 1920   DATE OF ADMISSION:  11/26/2002  DATE OF DISCHARGE:  11/27/2002                                 DISCHARGE SUMMARY   DISCHARGE DIAGNOSES:  1. Chest pain, normal coronaries by catheterization this admission.  She did     have some myocardial bridging noted.  2. History of palpitations.  3. PTVDP December 2000, with a Medtronic KDR 601.  4. Gastroesophageal reflux disease.  5. History of hypertension.  6. History of shingles.   HOSPITAL COURSE:  The patient is an 77 year old female followed by Dr.  Alanda Amass and Dr. Tawanna Cooler.  She has a history of normal coronaries in the past.  She had a pacemaker implanted December 2000.  Recently her medications were  adjusted for hypotension by Dr. Tawanna Cooler.  Apparently her beta-blocker was cut  back.   The patient presented to the emergency room November 26, 2002, with chest pain  and palpitations. She was admitted by Dr. Waldon Reining.  CK, MB, and Troponins  were negative.  She was started on Lovenox on admission.  Her initial  Troponin was 0.06 and then went to 0.14.  CKs and MBs were negative.   She had diagnostic catheterization done November 27, 2002.  This revealed  normal coronaries with some myocardial bridging, EF 65%.  We feel that she  can be discharged later today.  We did increase her beta-blocker and changed  her Zestoretic 10/25 to Zestril 10 mg daily.   LABORATORY DATA:  On admission, sodium 138, potassium 3.2, BUN 29,  creatinine 0.9.  Liver functions were slightly elevated with AST 48, ALT 43,  and ALP 292, bilirubin negative.  CKs and MBs were negative.  Troponins are  slightly elevated as noted above.  White count 6.3, hemoglobin 10.6,  hematocrit 30.8,  platelets 190.  Magnesium 2.0.   EKG shows sinus rhythm, left atrial enlargement.   No chest x-ray report is in the chart although apparently it was ordered.   DISPOSITION:  The patient is discharged in stable condition and will follow  up with Dr. Alanda Amass. She will have an event monitor as an outpatient.  We  will follow up her chest x-ray and make an addendum to her discharge summary  if this is abnormal.  She also needs to have followup LFTs as an outpatient.     Abelino Derrick, P.A.-C                   Richard A. Alanda Amass, M.D.   Lenard Lance  D:  11/27/2002  T:  11/28/2002  Job:  [id number]   cc:   Tinnie Gens A. Tawanna Cooler, M.D. [facility]

## 2010-08-18 NOTE — Consult Note (Signed)
NAMEJEANNEMARIE, [name] Luna NO.:  [id number]   MEDICAL RECORD NO.:  [id number]          PATIENT TYPE:  INP   LOCATION:  4741                         FACILITY:  [facility]   PHYSICIAN:  Jordan Hawks. Elnoria Howard, MD    DATE OF BIRTH:  07-30-1920   DATE OF CONSULTATION:  12/28/2005  DATE OF DISCHARGE:                                   CONSULTATION   REFERRING PHYSICIAN:  Nanetta Batty, M.D.   PRIMARY CARE PHYSICIAN:  Tinnie Gens A. Tawanna Cooler, MD   REASON FOR CONSULTATION:  Noncardiac chest pain.   HISTORY OF PRESENT ILLNESS:  This is an 77 year old female with a past  medical history of hypertension, hyperlipidemia, status post pacemaker  placement, macular degeneration, hypothyroidism and recurrent chest pain who  was admitted who presented to the emergency room with the complaints of  chest pain.  She states that she woke up at 3:00 a.m. with pain over her  left breast and then subsequently had a substernal pressure component  associated with nausea.  She took 3 nitroglycerin without any efficacy and  subsequently presented to the emergency room for further evaluation.  On  December 24, 2005, she underwent a cardiac catheterization with similar  types of symptoms, and it was significant for a 50% nonobstructing lesion in  her mid LAD.  It was not felt that she had any cardiac issues at this time  in regards to the cause of her chest pain.  She also was negative for any  myocardial infarction.  The patient subsequently was discharged and was to  have an outpatient Myoview.  Unfortunately, she represents with chest pain.  In the past, she had a history of gastroesophageal reflux disease, and she  has been on Nexium for the past 2-3 years.  This has controlled her reflux  type of symptoms which she denies any similarity to her current chest pain.  In the past, she was not awoken in the evening with her reflux disease.  The  patient denies being on Nexium b.i.d., and she is not certain if she  had an  EGD performed by Dr. Loreta Ave in the past; however, records states that she had  colonoscopy performed by Dr. Loreta Ave in 2002 with negative findings for an iron  deficiency anemia.   PAST MEDICAL HISTORY AND PAST SURGICAL HISTORY:  As stated above.   FAMILY HISTORY:  Noncontributory.   SOCIAL HISTORY:  The patient is not married and lives with her children.  She has 3 children.  No alcohol or tobacco use.   ALLERGIES:  1. PENICILLIN.  2. CODEINE.   REVIEW OF SYSTEMS:  As stated in the history of present illness, and  negative for any diarrhea, vomiting, dysuria, dysarthria, fevers, chills,  arthritis, arthralgias or skin rashes.   MEDICATIONS:  1. Verapamil 240 mg p.o. daily.  2. Hydrochlorothiazide 12.5 mg p.o. daily.  3. Aspirin 81 mg p.o. daily.  4. Zestril 20 mg p.o. b.i.d.  5. Synthroid 50 mcg p.o. daily.  6. Nexium 40 mg p.o. daily.  7. Metoprolol 100 mg p.o. b.i.d.  8. Crestor 10 mg p.o. daily.  PHYSICAL EXAMINATION:  VITAL SIGNS:  Blood pressure is 134/76, heart rate  67, respirations 17, temperature 98.4.  GENERAL:  The patient is in no acute distress.  Alert and oriented.  HEENT:  Normocephalic and atraumatic.  Extraocular muscles are intact.  Pupils equal, round and reactive to light.  NECK:  Supple.  No lymphadenopathy.  LUNGS:  Clear to auscultation bilaterally.  CARDIOVASCULAR:  Regular rate and rhythm without murmurs, gallops, or rubs.  ABDOMEN:  Flat, soft, nontender, nondistended.  No clubbing, cyanosis, or  edema.   LABORATORY VALUES:  On December 28, 2005, white blood cell count 7.2,  hemoglobin 11.2, MCV is 87.1, PT is 12.8, INR 1.0, PTT 25.  Sodium 141,  potassium 2.5, chloride 106, CO2 of 26, glucose 96, BUN 17, creatinine 0.8,  alkaline phosphatase is 224, AST 39, ALT 37.  CK-MB is 5.0.  Troponin I is  0.03.   IMPRESSION:  1. Noncardiac chest pain.  2. Abnormal liver enzymes.   In regards to her chest pain, it is doubtful that this is  associated from  her coronary artery disease.  She is to have a Myoview, and it is scheduled  for tomorrow.  Provided that this is negative, then no further evaluation  will be required in regards to a cardiac workup; however, from GI  standpoint, she will be treated with Nexium 40 mg p.o. b.i.d. x1 month, and  her symptoms will be reevaluated.  If her symptoms are persistent at that  time, then a BRAVO study will be performed, and with the possibility of a  manometry.  Additionally, she is noted to have a marked elevation of her  alkaline phosphatase at 224, and further evaluation is required at this  time.  It is possible that her symptoms can be associated with gallbladder  disease.  However, her physical examination was benign.  Further evaluation  of her elevated alkaline phosphatase is required.  I will order an  ultrasound of her right upper quadrant to check for any evidence of stones  that may need to be extracted if they are stuck in the common bile duct  versus gallbladder disease.      Jordan Hawks Elnoria Howard, MD  Electronically Signed     PDH/MEDQ  D:  12/28/2005  T:  12/30/2005  Job:  [id number]   cc:   Nanetta Batty, M.D.  Jeffrey A. Tawanna Cooler, MD

## 2010-08-18 NOTE — Op Note (Signed)
[facility]  Patient:    [name] Luna                      MRN: [id number] Proc. Date: 03/16/99 Adm. Date:  [id number] Attending:  Armanda Magic CC:         Richard A. Alanda Amass, M.D.             Armanda Magic, M.D.             CP lab             Evette Georges, M.D. [facility]                           Operative Report  PROCEDURE:  Implantation of permanent DDDR rate responsive, mode-switching, autothreshold Medtronic Pulse generator with passive fixation tined, Medtronic S I in-line coaxial, atrial and ventricular electrodes.  IMPLANTING PHYSICIAN:  Richard A. Alanda Amass, M.D.  COMPLICATIONS: None.  ESTIMATED BLOOD LOSS:  Approximately 30 cc.  ANESTHESIA:  5 mm Valium p.o. premedication, 2 mg of Nubain for sedation during  procedure, 1% local Xylocaine.  PREOPERATIVE DIAGNOSES: 1. Syncope with documented complete heart-block and ventricular asystole 14  seconds. 2. Systemic hypertension. 3. Recent sinusitis on therapy. 4. Remote inguinal hernia repair. 5. Normal coronary arteries, remotely.  POSTOPERATIVE DIAGNOSES: 1. Syncope with documented complete heart-block and ventricular asystole 14  seconds. 2. Systemic hypertension. 3. Recent sinusitis on therapy. 4. Remote inguinal hernia repair. 5. Normal coronary arteries, remotely.  PULSE GENERATOR:  Medtronic DR #[id number]; SN: [id number] "Kappa".  ATRIAL ELECTRODE:  Medtronic #4592-53 cm; SN: [id number].  VENTRICULAR ELECTRODE:  Medtronic #5092-58 cm; SN: [id number].  UNIPOLAR ATRIAL THRESHOLD:  Minimal threshold 0.4 v, impedance 403 ohms, P-wave 1.3 mV.  BIPOLAR ATRIAL:  Minimal threshold 0.6 V, resistance 529 ohms, minimal thresholds P-wave 1.7 mV.  UNIPOLAR VENTRICULAR:  Minimal threshold 0.2 V, resistance 443 ohms.  R=4.8 mV.  BIPOLAR VENTRICULAR:  Minimal threshold 0.6 V, resistance 564 ohms.  R=9.4 mV.  There was V-A node Wenckebach with atrial pacing through the  implanted electrodes at 150 per minute.  There was retrograde V-A Wenckebach with ventricular pacing and atrial recording through the implanted electrodes at 90/min.  PULSE GENERATOR:  MR at BOL = 85; at RRT reverts to VVI at 65 per minute.  There was no diaphragmatic stimulation in unipolar, bipolar thresholds or esophageal stimulation at 10 volt output at through the implanted electrodes.  Generator conformed to manufacturer specifications with 3.6 volt output in the atrium and ventricular at 0.4 msec.  PROCEDURE:  Patient was brought to the second floor CP laboratory in postabsorptive state after 5 mg Valium p.o. premedication.  The right anterior chest was prepped, draped in the usual manner.  Xylocaine 1% was used for local anesthesia.  The patient was premedicated with 1 g of vancomycin IV (allergic to penicillin) and has been on Cipro p.o. along with decongestant and nasal steroids for recent sinus infection.  She was afebrile in the laboratory with normal WBC prior to the procedure.  A TTBP was then positioned through a short 6-French sheath in the left subclavian vein, which was freed up.  Right infraclavicular curvilinear and transverse incision was performed and brought down to the prepectoral fascia using blunt dissection and electrocautery to control hemostasis.  A pacemaker generator pocket was formed using blunt dissection. Subclavian vein was entered with single anterior puncture using an 18 thin-wall  needle  and using #2 and #9 peel-away Cook introducers, the atrial and ventricular electrodes were introduced in tandem with the stainless-steel J-tip guidewire in place, which was removed after positioning of the ventricular electrode in the V apex and removing the temporary transvenous pacemaker under fluoroscopic control. The atrial electrode was positioned in the right atrial appendage confirmed by fluoroscopy and rotational maneuvers.   Both electrodes are  stable and there were excellent acute thresholds present.  The patient was given 5 mg of Lopressor IV  during the procedure to slow her heart rate with underlying sinus tachycardia and intermittent short runs of SVT during electrode manipulation, which resolved. he electrodes were secured at the insertion site with the previously placed #1 silk figure-of-eight suture around a tissue sewing collar to control hemostasis and prevent migration.  They were further secured with two interrupted #1 silk sutures around a silicone sewing collar for each electrode.  The generator conformed to  manufacturer specification on PSA testing.  Antegrade Wenckebach was established with atrial pacing and retrograde Wenckebach with ventricular pacing and atrial  recording through the implanted electrodes with no significant retrograde V-A conduction present as outlined above.  The pocket was irrigated with 500 mg of kanamycin solution.  The sponge count was correct.  The generator was hooked to the electrodes in the proper sequence with the single hex nut tightened for each electrode and delivered into the pocket with the electrodes looped behind. Subcutaneous tissue was closed with two separate running layers of 2-0 Dexon suture and the skin was closed with 5-0 subcuticular Dexon suture.  Steri-Strips were applied.  Fluoroscopy showed good position of the atrial and ventricular electrodes and generator.  There was no pneumothorax.  The patient tolerated the procedure  well, was transferred to the holding area for postoperative programming. DD:  03/16/99 TD:  03/17/99 Job: [id number]

## 2010-09-06 ENCOUNTER — Emergency Department (HOSPITAL_COMMUNITY): Payer: Medicare Other

## 2010-09-06 ENCOUNTER — Inpatient Hospital Stay (HOSPITAL_COMMUNITY)
Admission: EM | Admit: 2010-09-06 | Discharge: 2010-09-08 | DRG: 247 | Disposition: A | Discharge status: Home / Self Care | Payer: Medicare Other | Attending: Cardiovascular Disease | Admitting: Cardiovascular Disease

## 2010-09-06 DIAGNOSIS — I4891 Unspecified atrial fibrillation: Secondary | ICD-10-CM | POA: Diagnosis present

## 2010-09-06 DIAGNOSIS — Z9861 Coronary angioplasty status: Secondary | ICD-10-CM

## 2010-09-06 DIAGNOSIS — E039 Hypothyroidism, unspecified: Secondary | ICD-10-CM | POA: Diagnosis present

## 2010-09-06 DIAGNOSIS — Z7901 Long term (current) use of anticoagulants: Secondary | ICD-10-CM

## 2010-09-06 DIAGNOSIS — I509 Heart failure, unspecified: Secondary | ICD-10-CM | POA: Diagnosis present

## 2010-09-06 DIAGNOSIS — Z7902 Long term (current) use of antithrombotics/antiplatelets: Secondary | ICD-10-CM

## 2010-09-06 DIAGNOSIS — Z880 Allergy status to penicillin: Secondary | ICD-10-CM

## 2010-09-06 DIAGNOSIS — K219 Gastro-esophageal reflux disease without esophagitis: Secondary | ICD-10-CM | POA: Diagnosis present

## 2010-09-06 DIAGNOSIS — I10 Essential (primary) hypertension: Secondary | ICD-10-CM | POA: Diagnosis present

## 2010-09-06 DIAGNOSIS — Z950 Presence of cardiac pacemaker: Secondary | ICD-10-CM

## 2010-09-06 DIAGNOSIS — I5030 Unspecified diastolic (congestive) heart failure: Secondary | ICD-10-CM | POA: Diagnosis present

## 2010-09-06 DIAGNOSIS — Z7982 Long term (current) use of aspirin: Secondary | ICD-10-CM

## 2010-09-06 DIAGNOSIS — I2510 Atherosclerotic heart disease of native coronary artery without angina pectoris: Principal | ICD-10-CM | POA: Diagnosis present

## 2010-09-06 LAB — COMPREHENSIVE METABOLIC PANEL
AST: 28 U/L (ref 0–37)
Albumin: 3.5 g/dL (ref 3.5–5.2)
Alkaline Phosphatase: 110 U/L (ref 39–117)
BUN: 28 mg/dL — ABNORMAL HIGH (ref 6–23)
CO2: 29 mEq/L (ref 19–32)
Chloride: 100 mEq/L (ref 96–112)
Creatinine, Ser: 1.14 mg/dL (ref 0.4–1.2)
GFR calc Af Amer: 54 mL/min — ABNORMAL LOW (ref >60)
GFR calc non Af Amer: 45 mL/min — ABNORMAL LOW (ref >60)
Potassium: 3.6 mEq/L (ref 3.5–5.1)
Total Bilirubin: 0.4 mg/dL (ref 0.3–1.2)

## 2010-09-06 LAB — URINALYSIS, ROUTINE W REFLEX MICROSCOPIC
Hgb urine dipstick: NEGATIVE
Nitrite: NEGATIVE
Protein, ur: NEGATIVE mg/dL
Specific Gravity, Urine: 1.016 (ref 1.005–1.030)
Urobilinogen, UA: 0.2 mg/dL (ref 0.0–1.0)

## 2010-09-06 LAB — CBC
MCHC: 32.6 g/dL (ref 30.0–36.0)
Platelets: 213 10*3/uL (ref 150–400)
RDW: 16.5 % — ABNORMAL HIGH (ref 11.5–15.5)
WBC: 6.2 10*3/uL (ref 4.0–10.5)

## 2010-09-06 LAB — CARDIAC PANEL(CRET KIN+CKTOT+MB+TROPI)
Relative Index: INVALID (ref 0.0–2.5)
Total CK: 90 U/L (ref 7–177)

## 2010-09-06 LAB — PROTIME-INR: INR: 1.94 — ABNORMAL HIGH (ref 0.00–1.49)

## 2010-09-06 LAB — CK TOTAL AND CKMB (NOT AT ARMC): Total CK: 103 U/L (ref 7–177)

## 2010-09-06 LAB — DIFFERENTIAL
Basophils Absolute: 0 10*3/uL (ref 0.0–0.1)
Eosinophils Absolute: 0.1 10*3/uL (ref 0.0–0.7)
Eosinophils Relative: 1 % (ref 0–5)
Monocytes Absolute: 0.7 10*3/uL (ref 0.1–1.0)

## 2010-09-06 LAB — URINE MICROSCOPIC-ADD ON

## 2010-09-06 LAB — PRO B NATRIURETIC PEPTIDE: Pro B Natriuretic peptide (BNP): 439.5 pg/mL (ref 0–450)

## 2010-09-07 LAB — PROTIME-INR
INR: 1.7 — ABNORMAL HIGH (ref 0.00–1.49)
Prothrombin Time: 20.8 seconds — ABNORMAL HIGH (ref 11.6–15.2)

## 2010-09-07 LAB — GLUCOSE, CAPILLARY: Glucose-Capillary: 103 mg/dL — ABNORMAL HIGH (ref 70–99)

## 2010-09-07 LAB — CBC
Hemoglobin: 11 g/dL — ABNORMAL LOW (ref 12.0–15.0)
MCH: 28.3 pg (ref 26.0–34.0)
MCHC: 31.8 g/dL (ref 30.0–36.0)
Platelets: 185 10*3/uL (ref 150–400)
RDW: 16.6 % — ABNORMAL HIGH (ref 11.5–15.5)

## 2010-09-07 LAB — CARDIAC PANEL(CRET KIN+CKTOT+MB+TROPI)
CK, MB: 2.6 ng/mL (ref 0.3–4.0)
Relative Index: INVALID (ref 0.0–2.5)
Total CK: 98 U/L (ref 7–177)

## 2010-09-07 LAB — HEPARIN LEVEL (UNFRACTIONATED): Heparin Unfractionated: 0.6 IU/mL (ref 0.30–0.70)

## 2010-09-08 HISTORY — PX: CARDIAC CATHETERIZATION: SHX172

## 2010-09-08 LAB — BASIC METABOLIC PANEL
BUN: 17 mg/dL (ref 6–23)
Calcium: 9.2 mg/dL (ref 8.4–10.5)
GFR calc non Af Amer: >60 mL/min (ref >60)
Glucose, Bld: 102 mg/dL — ABNORMAL HIGH (ref 70–99)
Potassium: 3.9 mEq/L (ref 3.5–5.1)
Sodium: 138 mEq/L (ref 135–145)

## 2010-09-08 LAB — HEPARIN LEVEL (UNFRACTIONATED): Heparin Unfractionated: <0.1 IU/mL — ABNORMAL LOW (ref 0.30–0.70)

## 2010-09-08 LAB — CBC
HCT: 34.2 % — ABNORMAL LOW (ref 36.0–46.0)
Hemoglobin: 11 g/dL — ABNORMAL LOW (ref 12.0–15.0)
MCH: 28.4 pg (ref 26.0–34.0)
RBC: 3.88 MIL/uL (ref 3.87–5.11)

## 2010-09-08 LAB — PROTIME-INR
INR: 1.59 — ABNORMAL HIGH (ref 0.00–1.49)
Prothrombin Time: 19.1 seconds — ABNORMAL HIGH (ref 11.6–15.2)

## 2010-09-13 NOTE — Cardiovascular Report (Signed)
NAMEMarland Luna  Luna, [name] Luna.:  [id number]  MEDICAL RECORD NO.:  [id number]  LOCATION:  6529                         FACILITY:  [facility]  PHYSICIAN:  Nanetta Batty, M.D.   DATE OF BIRTH:  February 10, 1921  DATE OF PROCEDURE: DATE OF DISCHARGE:                           CARDIAC CATHETERIZATION   [name] is an 77 year old female with history of CAD, status post multiple PCI and stent procedures in the past.  Most recently, she had proximal LAD stent with a Promus in October 2011.  Her other problems include treated hypertension, diastolic dysfunction, prior paroxysmal AFib on amiodarone and Coumadin, status post permanent transvenous pacemaker insertion for complete heart block in October 2009.  She was admitted with unstable angina on June 6.  The patient ruled out for myocardial infarction.  She presents now for diagnostic coronary arteriography to define anatomy and rule out ischemic etiology.  Her INR at beginning of the case was 1.7.  her creatinine clearance was approximate 35 mL per minute.  DESCRIPTION OF PROCEDURE:  The patient was brought to the Second Floor [facility] Cardiac Cath Lab in postabsorptive state.  She was premedicated with p.o. Valium, IV Versed and fentanyl.  Her right groin was prepped and shaved in usual sterile fashion. Xylocaine 1% was used for local anesthesia.  A 6-French sheath was inserted into the right femoral artery using standard Seldinger technique.  A 6-French right and left Judkins diagnostic catheter with a French pigtail catheter were used for selective coronary angiography and left ventriculography respectively.  Visipaque dye was used for the entirety of the case. Retrograde aortic, left ventricular, and pullback pressures were recorded.  HEMODYNAMIC RESULTS: 1. Aortic systolic pressure 148, diastolic pressure 61. 2. Left ventricular systolic pressure 141 and diastolic pressure 11.  SELECTIVE CORONARY ANGIOGRAPHY: 1. Left  main normal. 2. LAD; the LAD had diffuse smooth 75% in-stent restenosis extending     just beyond the edge of the proximal stent.  The first diagonal     branch which arose proximal to the first stent had approximately     60% ostial stenosis, somewhat improved from the prior study. 3. Left circumflex; free of systemic disease. 4. Right coronary artery;  large dominant, free of systemic disease. 5. Left ventriculography; RAO left ventriculogram was performed using     5 mL of contrast via hand injection.  The overall LVEF was     estimated greater than 60% without focal wall motion abnormalities.  Impression:  [name] has in-stent restenosis within the proximal LAD stent with extension just beyond the stented segment.  We will proceed with PCI and stenting using Resolute zotarolimus drug-eluting stent and Angiomax.  The 5-French sheath was exchanged over wire for a 6-French sheath.  The patient was on aspirin and Plavix as well as Coumadin.  The Coumadin was on hold.  She received an additional 300 mg of p.o. Plavix.  Visipaque dye was used for the entirety of the case.  Retrograde aortic pressures were monitored during the case.  The total contrast administered to the patient was 105 mL.  She received Angiomax bolus with an ACT of 700.  Using a 6-French XBLAD 3.5 guide catheter along  with 0.14 x 190 Asahi soft wire and 2.0 x 12 Trek angioplasty was performed within the stent just beyond the stented segment.  Re-stenting was performed with a 3.0 x 18-mm long Resolute drug-eluting stent.  This was then deployed at 16 atmospheres and postdilated with a 3.25 x 15 [place] Trek at 16 atmospheres (3.36-mm) resulting in reduction of a 75% smooth in-stent restenosis as well as native disease beyond the stent to 0% residual without dissection.  There was TIMI 3 flow at the end of the case.  The patient tolerated the procedure well without hemodynamic or  electrocardiographic sequelae.  IMPRESSION:  Successful percutaneous coronary intervention and re- stenting for "in-stent restenosis" using Resolute drug-eluting stent. The patient will be treated with aspirin and Plavix and gently hydrated. She will be restarted on Coumadin per pharmacy protocol.  I do not feel she needs heparin since she remains in sinus rhythm, AV pacing.  She will be discharged home in the next 48 hours.  She left lab in stable condition.     Nanetta Batty, M.D.     Cordelia Pen  D:  09/07/2010  T:  09/08/2010  Job:  [id number]  cc:   Second Floor [facility] Richard A. Alanda Amass, M.D. Jeffrey A. Tawanna Cooler, MD  Electronically Signed by Nanetta Batty M.D. on 09/13/2010 10:46:59 AM

## 2010-09-18 ENCOUNTER — Ambulatory Visit (INDEPENDENT_AMBULATORY_CARE_PROVIDER_SITE_OTHER): Payer: Medicare Other | Admitting: Internal Medicine

## 2010-09-18 ENCOUNTER — Encounter: Payer: Self-pay | Admitting: Internal Medicine

## 2010-09-18 VITALS — BP 154/80 | HR 91 | Wt 146.0 lb

## 2010-09-18 DIAGNOSIS — N390 Urinary tract infection, site not specified: Secondary | ICD-10-CM

## 2010-09-18 DIAGNOSIS — K219 Gastro-esophageal reflux disease without esophagitis: Secondary | ICD-10-CM

## 2010-09-18 DIAGNOSIS — R350 Frequency of micturition: Secondary | ICD-10-CM | POA: Insufficient documentation

## 2010-09-18 NOTE — Progress Notes (Signed)
  Subjective:    Patient ID: [name] Luna, female    DOB: 02/07/1921, 78 y.o.   MRN: [id number]  HPI Pt presents to clinic for evaluation of frequent urination and abdominal burning. Notes recent 1 wk + h/o abdominal burning that radiates upward to chest with associated sour/bitter taste in mouth. Denies dysphagia or abdominal pain. Takes protonix 80mg  po qam.  Hospitalized 6/6 with CP with subsequent cardiac catheterization showing restenosis of LAD requiring PCI and drug eluting stent placement. States was discharged with lasix 60mg  po qd which has caused increased frequent urination. Denies f/c or dysuria. Maintained on coumadin followed by cardiology with recent INR reportedly ~2.1.  No other exacerbating or alleviating factors. No other complaints.  Reviewed pmh, medications and allergies    Review of Systems see hpi     Objective:   Physical Exam  Nursing note and vitals reviewed. Constitutional: She appears well-developed and well-nourished. No distress.  HENT:  Head: Normocephalic and atraumatic.  Right Ear: External ear normal.  Left Ear: External ear normal.  Nose: Nose normal.  Eyes: Conjunctivae are normal. No scleral icterus.  Neck: No JVD present.  Cardiovascular: Normal rate, regular rhythm and normal heart sounds.   Pulmonary/Chest: Effort normal and breath sounds normal. No respiratory distress. She has no wheezes. She has no rales.  Abdominal: Soft. Bowel sounds are normal. She exhibits no distension. There is no tenderness.  Neurological: She is alert.  Skin: Skin is warm and dry. She is not diaphoretic.  Psychiatric: She has a normal mood and affect.          Assessment & Plan:

## 2010-09-18 NOTE — Assessment & Plan Note (Signed)
History suggestive of diuretic effect however cannot exclude UTI. Obtain UA and cx. If + treat and notify coumadin provider.

## 2010-09-18 NOTE — Assessment & Plan Note (Signed)
Clinical hx suggests breakthrough sx's at night. Change protonix to 40mg  po bid.

## 2010-09-19 NOTE — Progress Notes (Signed)
Addended by: Serena Colonel on: 09/19/2010 09:38 AM   Modules accepted: Orders

## 2010-09-22 NOTE — H&P (Signed)
NAME:  [name] Luna, [name] Luna NO.:  [id number]  MEDICAL RECORD NO.:  [id number]  LOCATION:  [facility]                         FACILITY:  [facility]  PHYSICIAN:  [name] Luna, M.D.DATE OF BIRTH:  04-12-1920  DATE OF ADMISSION:  09/06/2010 DATE OF DISCHARGE:                             HISTORY & PHYSICAL   CHIEF COMPLAINTS:  Chest pain and shortness of breath.  HISTORY OF PRESENT ILLNESS:  [name] Luna is an 77 year old female followed by Dr. Alanda Luna.  She has a history of coronary disease, she had an LAD drug-eluting stent in 2008 and had a new LAD site treated with a drug- eluting stent, October 2011.  She was admitted with diastolic heart failure in January 2012, and again in February 2012.  She did have a Myoview February 2012, that was low risk.  The February 2012 admission was complicated by transient confusion, she had a neuro workup that was unremarkable.  She also had positive stools and anemia and GI workup was undertaken, her upper endoscopy was unremarkable except she did have an esophageal stricture and some dysphagia.  This was dilated.  It was somewhat of  a long hospital course, and she was ultimately discharged to an interim nursing facility.  She is now living back at home with her son.  Her son says she has actually been doing pretty well until the last couple of nights when she has developed some chest pain and shortness of breath.  He treated this initially with nitroglycerin, but it recurred last night and he called the office and he was told to bring her here.  She currently still complains of some epigastric pain.  He says she has not had any swallowing issues or choking issues at home. He says the nitroglycerin seemed to help.  PAST MEDICAL HISTORY:  Remarkable for paroxysmal atrial fibrillation. She recently underwent DC cardioversion, July 12, 2010.  The plan has been for amiodarone and Coumadin for a month or so and then ultimately aspirin  and Plavix.  She has had admissions for acute on chronic diastolic heart failure, she has preserved LV function by echo in by Myoview.  She has treated hypertension.  She has gastroesophageal reflux with esophageal dilatation, February 2012.  She has past history of complete heart block, she had a Medtronic pacemaker implanted on October 2009.  She has treated hypertension and treated dyslipidemia and treated hypothyroidism.  Please see a med rec for complete medications.  ALLERGIES:  She reportedly has allergies and intolerances to PENICILLIN, CODEINE, STEROIDS, NITRATES and VALIUM.  SOCIAL HISTORY:  She is a widow.  She has two children, four grandchildren, five great-grandchildren.  She lives with her son.  Family history is unremarkable.  REVIEW OF SYSTEMS:  Essentially unremarkable except for noted above.  PHYSICAL EXAMINATION:  VITAL SIGNS:  Blood pressure 148/73, pulse 60, temperature 97. GENERAL:  She is an elderly pale, frail-looking female in no acute distress. HEENT:  Normocephalic.  Extraocular movements are intact.  Sclerae nonicteric.  She is hard of hearing.  She wears glasses. NECK:  Without JVD or bruit. CHEST:  Clear to auscultation and percussion. CARDIAC:  Regular rate and rhythm without obvious murmur, rub or  gallop. ABDOMEN:  Nontender, not distended. EXTREMITIES:  No edema.  Distal pulses are 3+/4. NEURO:  Grossly intact.  She is awake, alert, oriented, and operative. SKIN:  Cool and dry.  LABORATORY DATA:  White count 6.2, hemoglobin 11.1, hematocrit 34, platelets 213.  Sodium 140, potassium 3.6, BUN 28, creatinine 1.14, INR 1.94, troponin is less than 0.3, CK-MB is negative.  Chest x-ray shows cardiomegaly and atelectasis.  EKG is paced.  IMPRESSION: 1. Chest pain worrisome for unstable angina. 2. Known coronary disease with previous LAD drug-eluting stent, 2008     and again new site in October 2011, with low-risk Myoview, February     2012. 3. Good  LV function with diastolic dysfunction. 4. History of acute on chronic diastolic heart failure, February 2012. 5. Treated hypertension. 6. Paroxysmal atrial fibrillation, the patient was cardioverted in     July 12, 2010, she is on amiodarone and Coumadin. 7. History of complete heart block, the patient is status post     Medtronic pacemaker, October 2009. 8. Gastroesophageal reflux disease with esophageal dilatation,     February 2012. 9. History of anemia with negative GI workup, February 2012. 10.Questionable mild dementia with transient confusion during her last     hospitalization with a negative neuro workup. 11.Treated hypothyroidism.  PLAN:  The patient be admitted to telemetry and her enzymes cycled. There is no need for heparin as she is anticoagulated with Coumadin.  In the past, she has had problems with nitrates, which caused some orthostatic hypotension and syncope.  We may have to rechallenge her with this.     [name] Luna, P.A.   ______________________________ Pearletha Furl [name] Luna, M.D.    [name] Luna  D:  09/06/2010  T:  09/06/2010  Job:  [id number]  Electronically Signed by [name] Luna P.A. on 09/12/2010 08:50:57 AM Electronically Signed by [name] Luna M.D. on 09/22/2010 10:19:20 AM

## 2010-09-26 NOTE — Discharge Summary (Signed)
NAMEMarland Luna  TRIVIA, HEFFELFINGER NO.:  [id number]  MEDICAL RECORD NO.:  [id number]  LOCATION:  6529                         FACILITY:  [facility]  PHYSICIAN:  Nanetta Batty, M.D.   DATE OF BIRTH:  Mar 27, 1921  DATE OF ADMISSION:  09/06/2010 DATE OF DISCHARGE:  09/08/2010                              DISCHARGE SUMMARY   DISCHARGE DIAGNOSES: 1. Coronary artery disease status post Resolute drug-eluting stent to     75% in-stent restenosis within the left anterior descending. 2. Normal left ventricular function, history of paroxysmal atrial     fibrillation. 3. PTVDP 2009. 4. History of esophageal reflux disease.  HOSPITAL COURSE:  [name] Luna is 77 year old female with history of coronary disease status post drug-eluting stent to the LAD in 2008 and second drug-eluting stent to the LAD placed October 2011.  History also includes diastolic heart failure, gastroesophageal reflux disease, paroxysmal atrial fibrillation status post DC cardioversion in July 12, 2010, currently on amiodarone, Coumadin, hypertension, complete heart block status post Medtronic pacemaker implant in October 2009, dyslipidemia, hypothyroidism.  The patient presented with chest pain and shortness of breath which was treated with nitroglycerin initially, subsequently presented to the emergency room.  She was admitted to telemetry and cardiac enzymes were cycled.  They were negative x3.  She was already anticoagulated with Coumadin.  EKG showed generalized T-wave inversion in leads II, III, aVF, V3 through V6.  She is scheduled for repeat cardiac catheterization when INR gets below 1.7.  The patient continued to have chest pain but did have a "spell" which she claimed to be "clammy."  Cardiac catheterization was completed on September 07, 2010 with 75% in-stent restenosis in the LAD stent.  This was restented with a drug-eluting stent.  She will be continued on aspirin and Plavix and restarted on Coumadin.  The  patient had been seen by Dr. Allyson Sabal who feels she is ready for discharge home with followup in 1-2 weeks.  DISCHARGE LABS:  WBC is 8.1, hemoglobin 11.0, hematocrit 34.2, platelets 208,000.  INR 1.59, PT 19.1.  Sodium 138, potassium 3.9, chloride 101, carbon dioxide 31, glucose 102, BUN 17, creatinine 0.86, calcium 9.2.  REVIEW OF SYSTEMS:  Urinalysis showed small leukocytes and few squamous epithelials.  Thyroid stimulating hormone was 2.122.  BNP was 439.5. Hemoglobin A1c was 5.4, magnesium 2.4.  STUDIES/PROCEDURES: 1. Chest x-ray June 6, cardiomegaly and basilar atelectasis. 2. Cardiac catheterization on June 7 showed normal left main.  LAD had     diffuse 75% in-stent restenosis extending just beyond the edge of     the proximal stent.  First diagonal branch which arose proximal to     the stent had approximately 60% ostial stenosis somewhat improved     from prior study.  Left circumflex was free of systemic disease.     Right coronary artery was large dominant free of systemic disease.     Left ventriculography RAO, ventriculogram was performed using 5-mm     contrast hand injection.  Overall LVEF was estimated greater than     60% without focal wall motion abnormalities.  PCI was completed     with a Resolute zotarolimus drug-eluting stent and the Angiomax.  DISCHARGE MEDICATIONS: 1. Amiodarone 200 mg 1 tablet by mouth daily. 2. Aspirin enteric-coated 81 mg 1 tablet by mouth daily. 3. Calcium carbonate 750 mg 1 tablet by mouth twice daily. 4. Claritin 10 mg 1 tablet by mouth daily. 5. Coumadin 3 mg on Monday and Fridays and 2 mg on all other days. 6. Cozaar 50 mg 1 tablet by mouth daily. 7. Diltiazem CD 240 mg 1 capsule by mouth daily. 8. Furosemide 40 mg 1-1/2 tablet by mouth daily. 9. Levothyroxine 75 mcg 1 tab by mouth every morning. 10.Metoprolol tartrate 100 mg 1-1/2 tablet by mouth twice daily. 11.Multivitamin 1 tablet by mouth every morning. 12.Nitroglycerin sublingual  0.4 mg 1 tablet under the tongue every 5     minutes up to 3 doses for chest pain. 13.Ocuvite 1 tab by mouth twice daily. 14.Plavix 75 mg 1 tablet by mouth daily. 15.Potassium chloride 20 mEq 1 tablet by mouth daily. 16.Protonix 40 mg 2 tablets by mouth daily. 17.Simvastatin 20 mg 1 tablet by mouth daily at bedtime.  DISPOSITION:  [name] Luna will be discharged home in stable conditions. Recommend she increase her activity slowly and shower and bathe.  No lifting for 2 days.  It is recommended she eats a heart-healthy diet. If catheter site becomes red, painful swollen, discharges fluid or pus to call our office.  She will follow up with Dr. Alanda Amass and at the [facility] and our office will call with the appointment time.    ______________________________ Wilburt Finlay, PA   ______________________________ Nanetta Batty, M.D.    BH/MEDQ  D:  09/08/2010  T:  09/08/2010  Job:  [id number]  cc:   Gerlene Burdock A. Alanda Amass, M.D.  Electronically Signed by Wilburt Finlay PA on 09/21/2010 03:09:05 PM Electronically Signed by Nanetta Batty M.D. on 09/26/2010 09:22:13 AM

## 2010-10-10 ENCOUNTER — Encounter: Payer: Self-pay | Admitting: Family Medicine

## 2010-10-10 ENCOUNTER — Ambulatory Visit (INDEPENDENT_AMBULATORY_CARE_PROVIDER_SITE_OTHER): Payer: Medicare Other | Admitting: Family Medicine

## 2010-10-10 DIAGNOSIS — IMO0002 Reserved for concepts with insufficient information to code with codable children: Secondary | ICD-10-CM

## 2010-10-10 DIAGNOSIS — N390 Urinary tract infection, site not specified: Secondary | ICD-10-CM

## 2010-10-10 DIAGNOSIS — I10 Essential (primary) hypertension: Secondary | ICD-10-CM

## 2010-10-10 DIAGNOSIS — R350 Frequency of micturition: Secondary | ICD-10-CM

## 2010-10-10 LAB — POCT URINALYSIS DIPSTICK
Bilirubin, UA: NEGATIVE
Blood, UA: NEGATIVE
Glucose, UA: NEGATIVE
Nitrite, UA: POSITIVE
Urobilinogen, UA: 0.2
pH, UA: 6

## 2010-10-10 MED ORDER — SULFAMETHOXAZOLE-TRIMETHOPRIM 800-160 MG PO TABS: 1.0000 | ORAL_TABLET | Freq: Two times a day (BID) | ORAL | Status: AC

## 2010-10-10 NOTE — Patient Instructions (Signed)
Kept the Cozaar and half only take 25 mg daily.  Check your blood pressure and daily in the morning to go for your blood pressure should be around 135/85.  If in a week.  We don't see that then I would decrease the Lopressor by 50%.  Begin the Septra, one twice daily.  We will call you on Friday with your culture report

## 2010-10-10 NOTE — Progress Notes (Signed)
  Subjective:    Patient ID: [name] Luna, female    DOB: 12-21-1920, 77 y.o.   MRN: [id number]  HPI wiwnfred Is an 77 year old female, who comes in today accompanied by her son, who is her primary caregiver for evaluation of two days, history of frequency and dysuria.  She said no fever.  She was here two weeks ago and treated for a UTI.  Also, she was hospitalized recently, and Amy at around and diltiazem were added to her treatment program.  BP is 120/70, but like to see her blood pressure more than 130/85 range with her age   Review of Systems    General and neurologic review of systems negative, somewhat lightheaded when she stands up Objective:   Physical Exam    Well-developed well-nourished, female, no acute distress.  Examination abdomen is negative.  Cardiac exam shows normal sinus rhythm    Assessment & Plan:  UTI sent for culture.  Begin antibiotics.  Hypertension.  Decreased the Cozaar from 50 mg daily to 25 mg daily.  Monitor blood pressure goal 135/85

## 2010-10-12 LAB — URINE CULTURE

## 2010-10-13 ENCOUNTER — Telehealth: Payer: Self-pay | Admitting: *Deleted

## 2010-10-13 MED ORDER — CIPROFLOXACIN HCL 250 MG PO TABS: 250.0000 mg | ORAL_TABLET | Freq: Two times a day (BID) | ORAL | Status: AC

## 2010-10-13 NOTE — Telephone Encounter (Signed)
Message copied by Trenton Gammon on Fri Oct 13, 2010  3:32 PM ------      Message from: TODD, JEFFREY A      Created: Thu Oct 12, 2010  4:52 PM       Please call on Friday the final urine recall for sure, shows the bug is resistant to Septra.  If, however, she is asymptomatic.  I would continue this after if she still symptomatic, I. Would switch to Cipro 250 b.i.d. For one week then 250 nightly x 3 weeks

## 2010-10-13 NOTE — Telephone Encounter (Signed)
Spoke to son and the Doylene Bode is not helping.  Cipro sent to pharmacy.

## 2010-11-30 ENCOUNTER — Other Ambulatory Visit: Payer: Self-pay | Admitting: Family Medicine

## 2010-12-04 ENCOUNTER — Emergency Department (HOSPITAL_COMMUNITY): Payer: Medicare Other

## 2010-12-04 ENCOUNTER — Inpatient Hospital Stay (HOSPITAL_COMMUNITY)
Admission: EM | Admit: 2010-12-04 | Discharge: 2010-12-08 | DRG: 304 | Disposition: A | Discharge status: Home Health | Payer: Medicare Other | Source: Ambulatory Visit | Attending: Cardiology | Admitting: Cardiology

## 2010-12-04 DIAGNOSIS — Z7902 Long term (current) use of antithrombotics/antiplatelets: Secondary | ICD-10-CM

## 2010-12-04 DIAGNOSIS — I2789 Other specified pulmonary heart diseases: Secondary | ICD-10-CM | POA: Diagnosis present

## 2010-12-04 DIAGNOSIS — K219 Gastro-esophageal reflux disease without esophagitis: Secondary | ICD-10-CM | POA: Diagnosis present

## 2010-12-04 DIAGNOSIS — E785 Hyperlipidemia, unspecified: Secondary | ICD-10-CM | POA: Diagnosis present

## 2010-12-04 DIAGNOSIS — I959 Hypotension, unspecified: Secondary | ICD-10-CM | POA: Diagnosis not present

## 2010-12-04 DIAGNOSIS — I10 Essential (primary) hypertension: Principal | ICD-10-CM | POA: Diagnosis present

## 2010-12-04 DIAGNOSIS — I2510 Atherosclerotic heart disease of native coronary artery without angina pectoris: Secondary | ICD-10-CM | POA: Diagnosis present

## 2010-12-04 DIAGNOSIS — Z7982 Long term (current) use of aspirin: Secondary | ICD-10-CM

## 2010-12-04 DIAGNOSIS — Z950 Presence of cardiac pacemaker: Secondary | ICD-10-CM

## 2010-12-04 DIAGNOSIS — E039 Hypothyroidism, unspecified: Secondary | ICD-10-CM | POA: Diagnosis present

## 2010-12-04 DIAGNOSIS — Z9861 Coronary angioplasty status: Secondary | ICD-10-CM

## 2010-12-04 DIAGNOSIS — R079 Chest pain, unspecified: Secondary | ICD-10-CM | POA: Diagnosis present

## 2010-12-04 DIAGNOSIS — Z79899 Other long term (current) drug therapy: Secondary | ICD-10-CM

## 2010-12-04 DIAGNOSIS — I5033 Acute on chronic diastolic (congestive) heart failure: Secondary | ICD-10-CM | POA: Diagnosis present

## 2010-12-04 DIAGNOSIS — I4891 Unspecified atrial fibrillation: Secondary | ICD-10-CM | POA: Diagnosis present

## 2010-12-04 LAB — POCT I-STAT, CHEM 8
BUN: 23 mg/dL (ref 6–23)
Calcium, Ion: 1.09 mmol/L — ABNORMAL LOW (ref 1.12–1.32)
Chloride: 102 mEq/L (ref 96–112)
Glucose, Bld: 100 mg/dL — ABNORMAL HIGH (ref 70–99)
TCO2: 30 mmol/L (ref 0–100)

## 2010-12-04 LAB — COMPREHENSIVE METABOLIC PANEL
Albumin: 4.4 g/dL (ref 3.5–5.2)
Alkaline Phosphatase: 150 U/L — ABNORMAL HIGH (ref 39–117)
BUN: 21 mg/dL (ref 6–23)
CO2: 31 mEq/L (ref 19–32)
Chloride: 99 mEq/L (ref 96–112)
GFR calc non Af Amer: >60 mL/min (ref >60)
Potassium: 3.4 mEq/L — ABNORMAL LOW (ref 3.5–5.1)
Total Bilirubin: 0.5 mg/dL (ref 0.3–1.2)

## 2010-12-04 LAB — CARDIAC PANEL(CRET KIN+CKTOT+MB+TROPI)
CK, MB: 3.4 ng/mL (ref 0.3–4.0)
Relative Index: 2.8 — ABNORMAL HIGH (ref 0.0–2.5)
Total CK: 122 U/L (ref 7–177)
Troponin I: <0.3 ng/mL (ref <0.30)

## 2010-12-04 LAB — HEMOGLOBIN A1C
Hgb A1c MFr Bld: 5.4 % (ref <5.7)
Mean Plasma Glucose: 108 mg/dL (ref <117)

## 2010-12-04 LAB — PRO B NATRIURETIC PEPTIDE: Pro B Natriuretic peptide (BNP): 544.8 pg/mL — ABNORMAL HIGH (ref 0–450)

## 2010-12-04 LAB — DIFFERENTIAL
Basophils Absolute: 0 10*3/uL (ref 0.0–0.1)
Eosinophils Relative: 2 % (ref 0–5)
Lymphocytes Relative: 23 % (ref 12–46)
Lymphs Abs: 1.7 10*3/uL (ref 0.7–4.0)
Neutro Abs: 4.4 10*3/uL (ref 1.7–7.7)
Neutrophils Relative %: 60 % (ref 43–77)

## 2010-12-04 LAB — CBC
HCT: 39 % (ref 36.0–46.0)
Hemoglobin: 13.1 g/dL (ref 12.0–15.0)
MCV: 88 fL (ref 78.0–100.0)
RBC: 4.43 MIL/uL (ref 3.87–5.11)
RDW: 15.4 % (ref 11.5–15.5)
WBC: 7.4 10*3/uL (ref 4.0–10.5)

## 2010-12-04 LAB — HEPARIN LEVEL (UNFRACTIONATED): Heparin Unfractionated: 0.87 IU/mL — ABNORMAL HIGH (ref 0.30–0.70)

## 2010-12-04 LAB — MAGNESIUM: Magnesium: 2.2 mg/dL (ref 1.5–2.5)

## 2010-12-04 LAB — PROTIME-INR: INR: 0.8 (ref 0.00–1.49)

## 2010-12-04 LAB — CK TOTAL AND CKMB (NOT AT ARMC): Relative Index: 2.7 — ABNORMAL HIGH (ref 0.0–2.5)

## 2010-12-04 LAB — APTT: aPTT: 31 seconds (ref 24–37)

## 2010-12-05 LAB — LIPID PANEL
HDL: 65 mg/dL (ref >39)
Total CHOL/HDL Ratio: 2.2 RATIO
VLDL: 18 mg/dL (ref 0–40)

## 2010-12-05 LAB — CBC
HCT: 34.4 % — ABNORMAL LOW (ref 36.0–46.0)
Hemoglobin: 11.9 g/dL — ABNORMAL LOW (ref 12.0–15.0)
MCH: 28.1 pg (ref 26.0–34.0)
MCHC: 32.2 g/dL (ref 30.0–36.0)
MCHC: 32.6 g/dL (ref 30.0–36.0)
MCV: 88 fL (ref 78.0–100.0)
RDW: 15.4 % (ref 11.5–15.5)
RDW: 15.7 % — ABNORMAL HIGH (ref 11.5–15.5)

## 2010-12-05 LAB — PLATELET INHIBITION P2Y12: Platelet Function  P2Y12: 144 [PRU] — ABNORMAL LOW (ref 194–418)

## 2010-12-05 LAB — CARDIAC PANEL(CRET KIN+CKTOT+MB+TROPI): Relative Index: 2.9 — ABNORMAL HIGH (ref 0.0–2.5)

## 2010-12-05 LAB — HEPARIN LEVEL (UNFRACTIONATED): Heparin Unfractionated: 0.82 IU/mL — ABNORMAL HIGH (ref 0.30–0.70)

## 2010-12-06 ENCOUNTER — Inpatient Hospital Stay (HOSPITAL_COMMUNITY): Payer: Medicare Other

## 2010-12-06 MED ORDER — TECHNETIUM TC 99M TETROFOSMIN IV KIT
10.0000 | PACK | Freq: Once | INTRAVENOUS | Status: AC | PRN
  Administered 2010-12-06: 10 via INTRAVENOUS

## 2010-12-06 MED ORDER — TECHNETIUM TC 99M TETROFOSMIN IV KIT
30.0000 | PACK | Freq: Once | INTRAVENOUS | Status: AC | PRN
  Administered 2010-12-06: 30 via INTRAVENOUS

## 2010-12-07 LAB — URINALYSIS, ROUTINE W REFLEX MICROSCOPIC
Glucose, UA: NEGATIVE mg/dL
Hgb urine dipstick: NEGATIVE
Ketones, ur: NEGATIVE mg/dL
Protein, ur: NEGATIVE mg/dL

## 2010-12-07 LAB — BASIC METABOLIC PANEL
BUN: 25 mg/dL — ABNORMAL HIGH (ref 6–23)
Calcium: 9.6 mg/dL (ref 8.4–10.5)
Creatinine, Ser: 1.19 mg/dL — ABNORMAL HIGH (ref 0.50–1.10)
GFR calc non Af Amer: 43 mL/min — ABNORMAL LOW (ref >60)
Glucose, Bld: 107 mg/dL — ABNORMAL HIGH (ref 70–99)

## 2010-12-07 LAB — CBC
HCT: 36.3 % (ref 36.0–46.0)
MCHC: 32.8 g/dL (ref 30.0–36.0)
MCV: 87.5 fL (ref 78.0–100.0)
RDW: 15.6 % — ABNORMAL HIGH (ref 11.5–15.5)

## 2010-12-07 LAB — URINE MICROSCOPIC-ADD ON

## 2010-12-08 LAB — URINE CULTURE: Culture: NO GROWTH

## 2010-12-08 LAB — HEPARIN LEVEL (UNFRACTIONATED): Heparin Unfractionated: 0.1 IU/mL — ABNORMAL LOW (ref 0.30–0.70)

## 2010-12-12 HISTORY — PX: OTHER SURGICAL HISTORY: SHX169

## 2010-12-16 NOTE — H&P (Signed)
NAME:  [name] Luna, [name] Luna.:  [id number]  MEDICAL RECORD NO.:  [id number]  LOCATION:  [facility]                         FACILITY:  [facility]  PHYSICIAN:  Landry Corporal, MDDATE OF BIRTH:  1921/01/05  DATE OF ADMISSION:  12/04/2010 DATE OF DISCHARGE:                             HISTORY & PHYSICAL   PRIMARY CARDIOLOGIST:  [name] Luna A. Alanda Amass, MD  CHIEF COMPLAINT:  Chest pain and shortness of breath.  HISTORY OF PRESENT ILLNESS:  [name] Luna is a very pleasant 77 year old white female with history of coronary atherosclerotic heart disease. She has a history of a drug-eluting stent placed in the LAD in 2008, another drug-eluting stent placed in the LAD in 2011 and in June 2012, had in-stent restenosis in the LAD and underwent resolute drug-eluting stent to in-stent restenosis in her left anterior descending artery. She and her son both report that she has done quite well since that time, able to carry on her normal daily activities without any problems. She denies having any chest discomfort.  No heaviness or pressure.  No shortness of breath with exertion until she awakened this morning about 4:30 with shortness of breath and chest heaviness.  Her son immediately gave her sublingual nitroglycerin x2 without significant improvement prior to her arrival.  He also tells me that she was quite diaphoretic and pale during that time as well.  She does report that she has had some mild lower extremity edema over the last week or so, but typically this improves with her Lasix and when she is off her feet, it does seem to go down.  She denies any orthopnea or PND until tonight.  Currently, she is on 15 mcg of nitroglycerin and her blood pressure remains at 180 systolic.  On arrival, she was 189/80.  She is also receiving IV Lopressor.  Her EKG on arrival reveals paced rhythm.  Her troponin was negative.  Her BNP was 544.  Currently, she continues to experience shortness of  breath.  Her chest heaviness is at a 4 on a scale of 1-10 and she finds it difficult to talk as it does make her more short of breath, therefore history is obtained minimally from the patient, from the son and also from chart review.  She reports some very mild improvement in her discomfort.  PAST MEDICAL HISTORY: 1. Coronary atherosclerotic heart disease.     a.     Status post DES stent in the LAD in 2008.     b.     New DES stent to the LAD in October 2011.     c.     In-stent restenosis of the LAD, status post DES stent      placement in June 2012. 2. Normal LV function. 3. History of diastolic dysfunction. 4. Paroxysmal atrial fibrillation status post DC cardioversion in     April 2012. 5. Hypertension. 6. Gastroesophageal reflux status post esophageal dilation in February     2012. 7. Status post pacemaker placement for complete heart block in October     2009. 8. Dyslipidemia. 9. Hypothyroidism.  FAMILY HISTORY:  Unremarkable.  SOCIAL HISTORY:  She is a widow.  She lives with her son.  She has 2 children, 4 grandchildren and 5 great-grandchildren.  REVIEW OF SYSTEMS:  As per HPI, otherwise negative.  ALLERGIES:  PENICILLIN, CODEINE, STEROIDS, NITRATES, VALIUM, AND ISOSORBIDE.  PHYSICAL EXAMINATION:  VITAL SIGNS:  Blood pressure is 189/80, pulse is 67, respirations 18, pulse ox is 100%, temperature is 98.3. GENERAL:  This is a pleasant 77 year old white female. HEENT:  Pupils are equal, reactive to light and accommodation. Extraocular movements intact.  Sclerae are nonicteric.  She is hard of hearing. NECK:  Supple without bruit or JVD. CARDIOVASCULAR:  Regular rate and rhythm without murmur, gallop, or rub. LUNGS:  Diminished in bilateral bases, otherwise clear. ABDOMEN:  Soft, nontender without hepatosplenomegaly or masses.  Bowel sounds are present x4. EXTREMITIES:  Radial, femoral, dorsal pedal arteries are present without lower extremity edema.  No clubbing,  cyanosis, or ulcers. NEUROLOGIC:  Oriented to person, place and time.  Normal mood and affect.  Her skin is warm and dry.  LABORATORY DATA:  EKG is paced rhythm.  Her BNP is 544.8.  CMET:  Sodium is 140, potassium is 3.4, chloride is 99, CO2 is 31, glucose is 99, BUN is 21, creatinine 0.88, alkaline phosphatase is 150 with normal SGOT and SGPT.  CBC is within normal limits.  PTT is 31, PTT is 11.3, INR is 0.80.  Chest x-ray revealed cardiac enlargement with no evidence of active pulmonary disease.  IMPRESSION: 1. Chest pressure, possibly unstable angina. 2. Shortness of breath, likely secondary to acute-on-chronic diastolic     dysfunction. 3. Hypertension.  Currently, poorly controlled. 4. Normal left ventricular function. 5. Paroxysmal atrial fibrillation on amiodarone. 6. History of complete heart block status post Medtronic pacemaker in     October 2009. 7. Dyslipidemia. 8. Hypothyroidism. 9. Gastroesophageal reflux disease status post dilation.  PLAN:  We will admit to the CCU and cycle her enzymes.  We will continue to titrate her nitroglycerin for chest pain relief as well as her blood pressure.  We will replete her potassium and give her IV Lasix.  We will obtain an echocardiogram to assess for any changes in her LV function given her history of coronary artery disease and consider cardiac catheterization, however, we will monitor her closely over the next 24 hours.    ______________________________ Rea College, NP  I saw and examined the patient the morning of admission.  At that time she was pain free and breathing much better.  We had a detailed discussion as to her wishes and plan of care.  Please see my handwritten note for complete details.  She indicated that she was not interested in persuing cardiac catheterization unless  a nuclear stress test were to show high risk abnormalities.  ______________________________ Landry Corporal, MD    LS/MEDQ  D:   12/04/2010  T:  12/04/2010  Job:  [id number]  cc:   [facility]  Electronically Signed by Charmian Muff NP on 12/12/2010 10:05:06 PM Electronically Signed by Bryan Lemma MD on 12/16/2010 02:22:45 PM

## 2010-12-20 NOTE — Discharge Summary (Signed)
NAMEMarland Kitchen  [name] Luna, [name] Luna NO.:  [id number]  MEDICAL RECORD NO.:  [id number]  LOCATION:  2008                         FACILITY:  [facility]  PHYSICIAN:  [place] Cambrie Sonnenfeld, MD         DATE OF BIRTH:  1920-11-02  DATE OF ADMISSION:  12/04/2010 DATE OF DISCHARGE:  12/08/2010                              DISCHARGE SUMMARY   DISCHARGE DIAGNOSES: 1. Hypertension, controlled, resolved with hypotensive episodes. 2. Acute on chronic diastolic heart failure. 3. Chest pain, negative nuclear study. 4. Coronary artery disease, history of percutaneous coronary     intervention. 5. Mild pulmonary hypertension. 6. Paroxysmal atrial fibrillation, maintaining sinus rhythm. 7. Percutaneous transvenous pacemaker. 8. Normal ejection fraction with grade 1 diastolic dysfunction.  HOSPITAL COURSE:  [name] Luna is an 77 year old Caucasian female with a history of coronary artery disease status post drug-eluting stent to the LAD in 2008, drug-eluting stent to the LAD in October 2011, and in-stent restenosis of the LAD with a drug-eluting stent placement in June 2012. Her history also includes normal LV function, diastolic dysfunction, paroxysmal atrial fibrillation status post DC cardioversion in April 2012, hypertension, gastroesophageal reflux disease with esophageal dilation in February 2012, permanent pacemaker for complete heart block, implanted in October 2009, dyslipidemia, and hypothyroidism.  On the morning prior to admission, she developed shortness of breath, chest heaviness which was relieved with sublingual nitroglycerin x2 at home. She also became quite diaphoretic and pale during the chest pain.  She also reported some mild lower extremity edema over the week prior and is typically improves with her Lasix and when she is off her feet.  She denied any orthopnea or PND until recently.  She was placed on 15 mcg per minute on nitroglycerin and was receiving IV Lopressor in the emergency  room due to a blood pressure of 189/80.  Initial BNP was 544. She was admitted to the CCU.  Cardiac enzymes were cycled.  We continued her on IV nitroglycerin and titrated for chest pain and to help with her elevated blood pressure.  Potassium was low and that was repleted.  She was also given IV Lasix.  Echocardiogram was completed to assess LV function which showed an ejection fraction of 55-65%.  Grade 1 diastolic dysfunction.  No regional wall motion abnormalities.  Mildly dilated left atrium, mild-to-moderate regurgitation of the tricuspid valve and a peak pulmonary pressure of 35 mmHg.  There is also noted a trivial pericardial effusion posterior to the heart.  On the night of December 04, 2010, the patient had a little bit chest pain, but then had resolved and by December 05, 2010, she had no more episodes.  [name] Luna indicated that she was not interested in having a heart catheterization, but would rather do a nuclear stress test and proceed medical treatment.  The nuclear stress test was completed on December 06, 2010, and that showed no inducible ischemia with a calculated ejection fraction of 74%.  Wall motion was also noted to be normal.  She was also placed on heparin throughout her stay.  [name] Luna continued to be chest pain free.  She did have a hypotensive episode on December 07, 2010, with systolic blood  pressure of 90/42.  Due to decreased blood pressure, metoprolol had been not given for of the last 5 doses.  Last 2 doses of Cozaar had been held as well.  For this reason, we are going to decrease Cozaar to 25 mg daily, going to discontinue the Imdur, also going to discontinue diltiazem, decrease blood pressure 75 years b.i.d.  Currently, she has been chest pain free.  She has been seen by Dr. Rennis Golden who feels she is ready for discharge home.  She will be provided with Home Health Services.  We will ask the nurse to provide ambulatory blood pressures.  She has also been  scheduled for renal Dopplers on Tuesday, December 12, 2010, at 11 o'clock.  DISCHARGE LABS:  WBCs 5.8, hemoglobin 11.9, hematocrit 36.3, platelets are 180.  Sodium 138, potassium 3.7, chloride 99, carbon dioxide 31, glucose 107, BUN 25, creatinine 1.19, calcium of 9.6, magnesium was 2.2 on December 04, 2010.  Cardiac enzymes were negative x4.  BNP was 544.8 on initial admission and then 277 on December 07, 2010.  Total cholesterol was 143, triglycerides were 88, HDL 65, LDL 60, VLDL 18 with a total cholesterol HDL ratio of 2.2.  TSH was 3.323.  Urinalysis which showed few bacteria, few squamous epithelials, small bilirubin.  Urine culture is pending.  MRSA negative by PCR.  P2Y12 was 144, was checked on December 05, 2010.  STUDIES/PROCEDURES: 1. Chest x-ray on December 04, 2010, showed cardiac enlargement.     There is no evidence of active pulmonary disease. 2. Echocardiogram on December 05, 2010, showed normal left ventricular     cavity size.  There was mild concentric hypertrophy.  Systolic     function was normal with ejection fraction of 55-65%.  Wall motion     was normal.  No regional wall motion abnormalities.  Grade 1     diastolic dysfunction.  Mitral valve showed calcified annulus and     mild regurgitation.  Left atrium was mildly dilated.  Tricuspid     valve had mild-to-moderate regurgitation.  Pulmonary arteries     showed peak PA pressure of 35 mmHg and a trivial pericardial     effusion was identified posterior to the heart. 3. Nuclear stress test performed on December 06, 2010, showed no     inducible ischemia.  A calculated ejection fraction was 74%.  DISCHARGE MEDICATIONS: 1. Acetaminophen 325 mg 2 tablets by mouth every 4 hours as needed for     pain. 2. Cozaar 25 mg 1 tablet by mouth daily. 3. Furosemide 40 mg 1 tablet by mouth twice daily. 4. Metoprolol tartrate 50 mg 1-1/2 tablets by mouth twice daily. 5. Amiodarone 200 mg 1 tablet by mouth daily. 6.  Aspirin enteric coated 81 mg 1 tablet by mouth every morning. 7. Calcium carbonate 75 mg 1 tablet by mouth twice daily over the     counter. 8. Claritin 10 mg 1 tablet by mouth daily. 9. Levothyroxine 75 mcg 1 tablet by mouth every morning. 10.Multivitamin over the counter 1 tablet by mouth every morning. 11.Nexium 40 mg 1 capsule by mouth daily. 12.Nitroglycerin 0.4 mg sublingual 1 tablet under the tongue every 5     minutes up to 3 doses for chest pain. 13.Ocuvite 1 tablet by mouth twice daily. 14.Plavix 75 mg 1 tablet by mouth daily. 15.Potassium chloride 20 mEq 1 tablet by mouth daily. 16.Simvastatin 20 mg 1 tablet by mouth daily at bedtime.  DISPOSITION:  [name] Luna will be discharged home  in stable condition.  It was recommended she increase her activity slowly.  It was also recommended she eats a low-sodium, heart-healthy diet.  She will follow up on Tuesday, December 12, 2010 at 11 a.m. for renal ultrasound.  For this, she needs to be n.p.o. after midnight, water is okay. She is not to drink any caffeine or carbonated drinks.  She is also not to chew gum or smoke prior to this test, I do not think she smokes, so that should not be a problem.  She will then follow up with Dr. Alanda Amass on Thursday, December 14, 2010, at 11:30 a.m.    ______________________________ Wilburt Finlay, PA   ______________________________ [place] Adelis Docter, MD    BH/MEDQ  D:  12/08/2010  T:  12/08/2010  Job:  [id number]  cc:   Gerlene Burdock A. Alanda Amass, M.D.  Electronically Signed by Wilburt Finlay PA on 12/15/2010 02:42:41 PM Electronically Signed by Kirtland Bouchard. Kashton Mcartor M.D. on 12/20/2010 01:32:46 PM

## 2010-12-21 ENCOUNTER — Emergency Department (HOSPITAL_COMMUNITY): Payer: Medicare Other

## 2010-12-21 ENCOUNTER — Observation Stay (HOSPITAL_COMMUNITY)
Admission: EM | Admit: 2010-12-21 | Discharge: 2010-12-23 | Disposition: A | Discharge status: Home / Self Care | Payer: Medicare Other | Source: Ambulatory Visit | Attending: Cardiovascular Disease | Admitting: Cardiovascular Disease

## 2010-12-21 DIAGNOSIS — Z9861 Coronary angioplasty status: Secondary | ICD-10-CM | POA: Insufficient documentation

## 2010-12-21 DIAGNOSIS — Z01812 Encounter for preprocedural laboratory examination: Secondary | ICD-10-CM | POA: Insufficient documentation

## 2010-12-21 DIAGNOSIS — R079 Chest pain, unspecified: Principal | ICD-10-CM | POA: Insufficient documentation

## 2010-12-21 DIAGNOSIS — F411 Generalized anxiety disorder: Secondary | ICD-10-CM | POA: Insufficient documentation

## 2010-12-21 DIAGNOSIS — K219 Gastro-esophageal reflux disease without esophagitis: Secondary | ICD-10-CM | POA: Insufficient documentation

## 2010-12-21 DIAGNOSIS — R0602 Shortness of breath: Secondary | ICD-10-CM | POA: Insufficient documentation

## 2010-12-21 DIAGNOSIS — N183 Chronic kidney disease, stage 3 unspecified: Secondary | ICD-10-CM | POA: Insufficient documentation

## 2010-12-21 DIAGNOSIS — I129 Hypertensive chronic kidney disease with stage 1 through stage 4 chronic kidney disease, or unspecified chronic kidney disease: Secondary | ICD-10-CM | POA: Insufficient documentation

## 2010-12-21 DIAGNOSIS — I2510 Atherosclerotic heart disease of native coronary artery without angina pectoris: Secondary | ICD-10-CM | POA: Insufficient documentation

## 2010-12-21 DIAGNOSIS — Z01818 Encounter for other preprocedural examination: Secondary | ICD-10-CM | POA: Insufficient documentation

## 2010-12-21 DIAGNOSIS — I4891 Unspecified atrial fibrillation: Secondary | ICD-10-CM | POA: Insufficient documentation

## 2010-12-21 DIAGNOSIS — Z01810 Encounter for preprocedural cardiovascular examination: Secondary | ICD-10-CM | POA: Insufficient documentation

## 2010-12-21 DIAGNOSIS — E785 Hyperlipidemia, unspecified: Secondary | ICD-10-CM | POA: Insufficient documentation

## 2010-12-21 DIAGNOSIS — E039 Hypothyroidism, unspecified: Secondary | ICD-10-CM | POA: Insufficient documentation

## 2010-12-21 DIAGNOSIS — Z950 Presence of cardiac pacemaker: Secondary | ICD-10-CM | POA: Insufficient documentation

## 2010-12-21 LAB — CARDIAC PANEL(CRET KIN+CKTOT+MB+TROPI)
CK, MB: 3.1 ng/mL (ref 0.3–4.0)
Total CK: 82 U/L (ref 7–177)

## 2010-12-21 LAB — CK TOTAL AND CKMB (NOT AT ARMC)
CK, MB: 3.3 ng/mL (ref 0.3–4.0)
Relative Index: INVALID (ref 0.0–2.5)

## 2010-12-21 LAB — PRO B NATRIURETIC PEPTIDE: Pro B Natriuretic peptide (BNP): 399.1 pg/mL (ref 0–450)

## 2010-12-21 LAB — POCT I-STAT, CHEM 8
Chloride: 102 mEq/L (ref 96–112)
Glucose, Bld: 100 mg/dL — ABNORMAL HIGH (ref 70–99)
HCT: 34 % — ABNORMAL LOW (ref 36.0–46.0)
Potassium: 3.8 mEq/L (ref 3.5–5.1)

## 2010-12-21 LAB — POCT I-STAT TROPONIN I: Troponin i, poc: 0 ng/mL (ref 0.00–0.08)

## 2010-12-21 LAB — DIFFERENTIAL
Basophils Absolute: 0 10*3/uL (ref 0.0–0.1)
Eosinophils Absolute: 0.1 10*3/uL (ref 0.0–0.7)
Eosinophils Relative: 1 % (ref 0–5)
Lymphocytes Relative: 18 % (ref 12–46)
Lymphs Abs: 1.1 10*3/uL (ref 0.7–4.0)
Neutrophils Relative %: 71 % (ref 43–77)

## 2010-12-21 LAB — APTT: aPTT: 31 seconds (ref 24–37)

## 2010-12-21 LAB — CBC
MCV: 87.3 fL (ref 78.0–100.0)
Platelets: 182 10*3/uL (ref 150–400)
RBC: 4.01 MIL/uL (ref 3.87–5.11)
RDW: 15.2 % (ref 11.5–15.5)
WBC: 6.1 10*3/uL (ref 4.0–10.5)

## 2010-12-21 LAB — COMPREHENSIVE METABOLIC PANEL
ALT: 23 U/L (ref 0–35)
AST: 28 U/L (ref 0–37)
Albumin: 4 g/dL (ref 3.5–5.2)
CO2: 31 mEq/L (ref 19–32)
Calcium: 9.5 mg/dL (ref 8.4–10.5)
Creatinine, Ser: 0.94 mg/dL (ref 0.50–1.10)
Sodium: 138 mEq/L (ref 135–145)
Total Protein: 7.3 g/dL (ref 6.0–8.3)

## 2010-12-21 LAB — TROPONIN I: Troponin I: <0.3 ng/mL (ref <0.30)

## 2010-12-21 LAB — PROTIME-INR: INR: 0.95 (ref 0.00–1.49)

## 2010-12-22 HISTORY — PX: CARDIAC CATHETERIZATION: SHX172

## 2010-12-22 LAB — LIPID PANEL
HDL: 54 mg/dL (ref >39)
Total CHOL/HDL Ratio: 2.8 RATIO
VLDL: 19 mg/dL (ref 0–40)

## 2010-12-22 LAB — CARDIAC PANEL(CRET KIN+CKTOT+MB+TROPI)
CK, MB: 3.1 ng/mL (ref 0.3–4.0)
Total CK: 82 U/L (ref 7–177)

## 2010-12-22 LAB — TSH: TSH: 2.497 u[IU]/mL (ref 0.350–4.500)

## 2010-12-23 LAB — BASIC METABOLIC PANEL
BUN: 13 mg/dL (ref 6–23)
CO2: 27 mEq/L (ref 19–32)
Calcium: 9 mg/dL (ref 8.4–10.5)
Glucose, Bld: 104 mg/dL — ABNORMAL HIGH (ref 70–99)
Sodium: 141 mEq/L (ref 135–145)

## 2010-12-24 NOTE — Cardiovascular Report (Signed)
  NAMEMarland Kitchen  [name], Luna NO.:  [id number]  MEDICAL RECORD NO.:  [id number]  LOCATION:  2039                         FACILITY:  [facility]  PHYSICIAN:  [place] Miangel Flom, MD         DATE OF BIRTH:  1921/03/03  DATE OF PROCEDURE:  12/22/2010 DATE OF DISCHARGE:                           CARDIAC CATHETERIZATION   CHIEF COMPLAINT:  Chest pain.  OPERATOR:  [place] Jenika Chiem, MD  HISTORY OF PRESENT ILLNESS:  [name] Luna is an 77 year old female with recurrent chest pain recently underwent Resolute drug-eluting stent placement to the proximal LAD for in-stent restenosis.  She had a nuclear stress test performed recently which was negative, however presents with recurrent chest pain symptoms and was referred for cardiac catheterization.  PROCEDURE:  After informed consent was obtained, the patient was brought to cardiac cath lab and sterilely prepped and draped in the usual fashion.  After procedural radiation and safety time-out, the area around the right femoral artery was identified and anesthetized with 5 mL of 1% lidocaine.  Due to concerns about euphoria with sedation in the past, no sedation was given.  The right femoral artery was accessed with a straight needle and wire and a catheterization was performed with a 5- [place] JL-4, a pigtail and JR-4 catheter which were specially sheathed to engage the ostium.  The estimated blood loss was less than 10 mL.  No acute complications.  FINDINGS: 1. Left main - no disease. 2. LAD - patent proximal stent. 3. Left circumflex - smaller vessel with mild luminal irregularities     but no significant obstructive disease. 4. RCA - dominant vessel with no significant obstructive disease. 5. LVEDP = 15 mmHg.  IMPRESSION: 1. Patent proximal left anterior descending stent otherwise, no     obstructive coronary artery disease. 2. Left ventricular end-diastolic pressure = 15 mmHg.  PLAN:  [name] Luna has notable elevated LVEDP with  increased blood pressures this may be contributing to her chest pain.  I would like to therefore work on improving her blood pressure medications.  She was initially given 2 sublingual nitroglycerin sprays and this reduced her systolic blood pressures from 161 to 170.  Prior to transport to recovery, she was given another additional 20 mg of IV hydralazine prior to sheath pull.  We will look to up titrate her blood pressure medicines during this hospitalization and possibly discharge her home tomorrow.     [place] Saleena Tamas, MD     CH/MEDQ  D:  12/22/2010  T:  12/23/2010  Job:  [id number]  cc:   Gerlene Burdock A. Alanda Amass, M.D.  Electronically Signed by Kirtland Bouchard. Jovante Hammitt M.D. on 12/24/2010 03:10:03 PM

## 2010-12-26 LAB — CK TOTAL AND CKMB (NOT AT ARMC)
Relative Index: 3.9 — ABNORMAL HIGH
Relative Index: 4.1 — ABNORMAL HIGH
Total CK: 242 — ABNORMAL HIGH

## 2010-12-26 LAB — BASIC METABOLIC PANEL
BUN: 12
CO2: 27
Calcium: 8.6
Calcium: 8.8
Chloride: 105
GFR calc Af Amer: >60
GFR calc Af Amer: >60
GFR calc non Af Amer: >60
GFR calc non Af Amer: >60
Glucose, Bld: 119 — ABNORMAL HIGH
Glucose, Bld: 99
Potassium: 3.7
Sodium: 137
Sodium: 141

## 2010-12-26 LAB — POCT CARDIAC MARKERS
CKMB, poc: 10.5
CKMB, poc: 6.6
Myoglobin, poc: 112
Myoglobin, poc: 116
Operator id: 272551
Operator id: 272551
Troponin i, poc: <0.05
Troponin i, poc: <0.05

## 2010-12-26 LAB — CBC
Hemoglobin: 11.1 — ABNORMAL LOW
Hemoglobin: 11.2 — ABNORMAL LOW
Hemoglobin: 12.1
MCHC: 33.9
MCV: 87.5
Platelets: 157
RBC: 3.67 — ABNORMAL LOW
RBC: 4.08
RDW: 14.4
RDW: 14.7
RDW: 15
WBC: 9.2

## 2010-12-26 LAB — POCT I-STAT, CHEM 8
BUN: 16
Calcium, Ion: 1.13
Chloride: 106
Creatinine, Ser: 0.8
Glucose, Bld: 110 — ABNORMAL HIGH
HCT: 39
Hemoglobin: 13.3
Potassium: 3.5
Sodium: 142
TCO2: 25

## 2010-12-26 LAB — DIFFERENTIAL
Basophils Absolute: 0.1
Basophils Relative: 1
Eosinophils Absolute: 0.2
Monocytes Absolute: 0.8
Monocytes Relative: 10
Neutrophils Relative %: 63

## 2010-12-26 LAB — APTT: aPTT: 112 — ABNORMAL HIGH

## 2010-12-26 LAB — LIPID PANEL
HDL: 44
Total CHOL/HDL Ratio: 3.4
VLDL: 8

## 2010-12-26 LAB — PROTIME-INR
INR: 1
Prothrombin Time: 13.4

## 2010-12-26 LAB — TROPONIN I: Troponin I: 0.03

## 2010-12-26 LAB — MAGNESIUM: Magnesium: 2.1

## 2010-12-31 NOTE — H&P (Signed)
NAME:  RICK, WARNICK NO.:  [id number]  MEDICAL RECORD NO.:  [id number]  LOCATION:  [facility]                         FACILITY:  [facility]  PHYSICIAN:  Nanetta Batty, M.D.   DATE OF BIRTH:  [birth year]  DATE OF ADMISSION:  12/21/2010 DATE OF DISCHARGE:                             HISTORY & PHYSICAL   CHIEF COMPLAINTS:  Chest pain.  HISTORY OF PRESENT ILLNESS:  Ms. Cyran is an 77 year old female, followed by Dr. Alanda Amass.  She has history of coronary artery disease. She had an LAD drug-eluting stent in 2008 and a new LAD drug-eluting stent placed in October 2011.  Her last catheterization was in June 2012, at that time, she had a 75% in-stent restenosis to the LAD, which was treated with re-stenting with a Resolute stent.  She was admitted again, December 04, 2010 to December 08, 2010 with chest pain.  At that time, her blood pressure was labile and it was felt her pain may have been secondary to diastolic dysfunction.  Her BNP was 459.  She did have a Myoview study that was negative for ischemia and an echocardiogram which showed good LV function with diastolic dysfunction, grade 1.  She saw Dr. Alanda Amass last week and complained of some chest discomfort at night. She describes pressure under her left breast that wakes her up at night. She says that sometimes she has palpitations with this.  It goes away in 10 minutes without treatment.  Dr. Alanda Amass added Imdur 15 mg at bedtime.  In the past, she has had problems tolerating Imdur.  He also evaluated her pacemaker and found no arrhythmia.  Since then, she has had chest pain at night off and on.  Her son says it is every other night.  He says the patterns are same.  She wakes up with discomfort under her left breast.  Her blood pressure is high and she is anxious, then her symptoms abate after 10 minutes without treatment.  Today, she was at the beauty shop, getting her hair done when she had some discomfort in her  left chest and mentioned it to the beauty salon personnel.  EMS was called.  She was given nitroglycerin and is now seen in the emergency room.  She is currently pain-free.  PAST MEDICAL HISTORY:  Remarkable for PAF with sick sinus syndrome.  She had a Medtronic pacemaker implanted in 2009.  She had a DC cardioversion in April 2012 and is on amiodarone.  She is not on Coumadin.  She has hypertension, her son says that her blood pressure overall is well controlled at home.  She has esophageal reflux and had esophageal dilatation in February 2012.  She has treated dyslipidemia and treated hypothyroidism.  MEDICATIONS: 1. Cozaar 25 mg a day. 2. Lasix 40 mg b.i.d. 3. Metoprolol tartrate 75 mg b.i.d. 4. Amiodarone 200 mg daily. 5. Aspirin 81 mg a day. 6. Tums Extra Strength twice a day. 7. Claritin 10 mg daily. 8. Levothyroxine 0.075 mg a day. 9. Multivitamins daily. 10.Nexium 40 mg daily. 11.Nitroglycerin sublingual p.r.n. 12.Ocuvite daily. 13.Plavix 75 mg a day. 14.Potassium 20 mEq a day. 15.Simvastatin 20 mg a day. 16.Imdur 15 mg at bedtime.  ALLERGIES:  She has multiple drug allergies and intolerances including PENICILLIN, CODEINE, PREDNISONE, DIAZEPAM, and LORAZEPAM, and has been intolerant to higher doses of IMDUR in the past.  SOCIAL HISTORY:  She is a widow.  She has two children, four grandchildren, and five grandchildren.  She never smoked.  She lives with her son.  FAMILY HISTORY:  Unremarkable.  REVIEW OF SYSTEMS:  Essentially unremarkable except for noted above.  PHYSICAL EXAM:  VITAL SIGNS:  Blood pressure 148/64, pulse 75, respirations 12. GENERAL:  She is an elderly frail female, in no acute distress. HEENT:  Normocephalic.  Extraocular movements intact.  Sclerae nonicteric.  She wears glasses.  Lids and conjunctivae within normal limits. NECK:  Without JVD or bruit. CHEST:  Clear to auscultation and percussion. CARDIAC:  Reveals regular rate and rhythm  with soft systolic murmur at the left sternal border and aortic valve area.  Normal S1 and S2. ABDOMEN:  Nontender, nondistended. EXTREMITIES:  Without edema.  Distal pulses are 3+/4 bilaterally. NEURO:  Grossly intact.  She is awake, alert, oriented, cooperative. Moves all extremities without obvious deficit. SKIN:  Cool and dry.  EKG is paced.  Sodium 138, potassium 3.8, BUN 31, creatinine 1.2. Troponins were negative x2.  Hemoglobin was 11, hematocrit 34.  IMPRESSION: 1. Recurrent chest pain, rule out cardiac. 2. Known coronary disease, left anterior descending drug-eluting stent     placement in 2008 with a separate drug-eluting stent placed to the     left anterior descending in October 2011 with in-stent restenosis     in June 2012, treated with Resolute stent.  Negative Myoview,     December 06, 2010. 3. Normal left ventricular function with diastolic dysfunction. 4. Paroxysmal atrial fibrillation with sick sinus syndrome, the     patient has had a pacemaker implanted in 2009.  She is on     amiodarone.  She was cardioverted in April 2012. 5. Hypertension, which has been somewhat labile. 6. Gastroesophageal reflux, status post esophageal dilatation,     February 2012. 7. Treated dyslipidemia. 8. Treated hypothyroidism.  PLAN:  The patient was seen by Dr. Allyson Sabal and myself in the emergency room.  We will go ahead and admit her and continue to cycle her enzymes. Dr. Allyson Sabal feels it is best to proceed with diagnostic catheterization because of continued episodes of chest pain, requiring admission.     Abelino Derrick, P.A.   ______________________________ Nanetta Batty, M.D.    Lenard Lance  D:  12/21/2010  T:  12/21/2010  Job:  [id number]  Electronically Signed by Corine Shelter P.A. on 12/21/2010 06:35:55 PM Electronically Signed by Nanetta Batty M.D. on 12/31/2010 11:47:36 AM

## 2010-12-31 NOTE — Discharge Summary (Signed)
NAMEMarland Luna  RINOA, GARRAMONE NO.:  [id number]  MEDICAL RECORD NO.:  [id number]  LOCATION:  2039                         FACILITY:  [facility]  PHYSICIAN:  Nanetta Batty, M.D.   DATE OF BIRTH:  22-Aug-1920  DATE OF ADMISSION:  12/21/2010 DATE OF DISCHARGE:  12/23/2010                              DISCHARGE SUMMARY   DISCHARGE DIAGNOSES: 1. Chest pain, negative myocardial infarction, negative heart cath     with patent coronary arteries and patent stent to left anterior     descending.  Normal left ventricular function.     a.     This could be reflux as the patient has had gastrointestinal      issues in the past, also could be anxiety.  We will have her      follow up with GI physician first. 2. Coronary artery disease with history of left anterior descending     stent and in-stent restenosis in June 2012 with intervention. 3. Hypertension, controlled at this admission.  Previously, had been     uncontrolled, but currently on this admission she was 148 on     admission systolically, and during the hospitalization, she has     been stable. 4. Chronic kidney disease stage III, maintaining stable creatinine. 5. Paroxysmal atrial fibrillation and sick sinus syndrome with     permanent transvenous pacemaker, currently atrial pacing. 6. Gastroesophageal reflux disease. 7. Upper esophageal stenosis, needing pediatric endoscope, but no     stricture, and she underwent dilatation in February 2012. 8. Anxiety.  DISCHARGE CONDITION:  Improved.  PROCEDURES:  Combined left heart cath, December 22, 2010 by Dr. Italy Hilty, with patent proximal LAD stent and LVEDP was 15 mmHg.  HOSPITAL COURSE:  Mr. Group was admitted on December 21, 2010 secondary to chest pain.  She still has episodes that wake her from sleep with discomfort under her left breast.  On the day of admission, though she was at the beauty shop, having her hair done, when she had discomfort in the left chest.  She  mentioned it to the [employer] salon personnel, EMS was called.  She was given nitro and was pain-free in the emergency room. Other history includes dyslipidemia and treated hypothyroidism.  The patient was admitted for further evaluation.  Cardiac enzymes have been negative.  Dr. Allyson Sabal felt cardiac cath would help [place] determine if this was causing her issue and that she was admitted to [facility] on December 04, 2010 with chest pain and shortness of breath.  At that time, she was hypertensive with blood pressure 189/80.  She underwent cardiac cath with results as stated.  By the next morning, December 23, 2010, she is stable without complaints.  She will ambulate in the hall. If she has no complication, she will be discharged home.  We will ask her to see Dr. Elnoria Howard with GI as an outpatient.  Our office will call and make that for her.  LABORATORY DATA:  Hemoglobin 1.9, hematocrit 35, WBC 6.1, platelets 182, neutrophils 71, lymphs 18, monos 10, pro time 12.9, INR 0.95, PTT 31. Chemistry:  Sodium 141, potassium 3.7, chloride 105, CO2 27, glucose 104, BUN 13, creatinine 0.86,  total bili 0.4, alkaline phos 127, SGOT 28, SGPT 23, total protein 7.3, albumin 4.0, calcium 9.5.  Cardiac enzymes, CK 82 with MBs of 3.1, troponin-I less than 0.30.  BNP was 399. Lipids, cholesterol 152, triglycerides 93, HDL 54, LDL 79.  TSH 2.497.  Chest x-ray on admission, no evidence of acute cardiopulmonary disease, stable cardiomegaly, right subclavian pacemaker.  DISCHARGE MEDICATIONS:  See medication reconciliation sheet.  DISCHARGE INSTRUCTIONS: 1. Activity as tolerated.  Increase activity slowly. 2. Low-sodium heart-healthy diet. 3. Wash cath site with soap and water.  Call if any bleeding,     swelling, or drainage.  Follow up with Dr. Alanda Amass, our office     will call with date and time.  Follow up with Dr. Elnoria Howard, our office     will call with date and time.     [name]. Annie Paras,  N.P.   ______________________________ Nanetta Batty, M.D.    LRI/MEDQ  D:  12/23/2010  T:  12/23/2010  Job:  [id number]  cc:   Gerlene Burdock A. Alanda Amass, M.D. Jeffrey A. Tawanna Cooler, MD Jordan Hawks Elnoria Howard, MD  Electronically Signed by Nada Boozer N.P. on 12/24/2010 11:43:41 AM Electronically Signed by Nanetta Batty M.D. on 12/31/2010 11:47:32 AM

## 2011-01-02 LAB — GLUCOSE, CAPILLARY: Glucose-Capillary: 83

## 2011-01-02 LAB — URINALYSIS, ROUTINE W REFLEX MICROSCOPIC
Hgb urine dipstick: NEGATIVE
Nitrite: NEGATIVE
Specific Gravity, Urine: 1.013
Urobilinogen, UA: 1

## 2011-01-05 LAB — APTT: aPTT: 52 seconds — ABNORMAL HIGH (ref 24–37)

## 2011-01-05 LAB — BASIC METABOLIC PANEL
Calcium: 9.4 mg/dL (ref 8.4–10.5)
Creatinine, Ser: 0.71 mg/dL (ref 0.4–1.2)
GFR calc non Af Amer: >60 mL/min (ref >60)

## 2011-01-05 LAB — COMPREHENSIVE METABOLIC PANEL
ALT: 26 U/L (ref 0–35)
AST: 31 U/L (ref 0–37)
Albumin: 3.3 g/dL — ABNORMAL LOW (ref 3.5–5.2)
Chloride: 109 mEq/L (ref 96–112)
Creatinine, Ser: 0.64 mg/dL (ref 0.4–1.2)
GFR calc Af Amer: >60 mL/min (ref >60)
Sodium: 143 mEq/L (ref 135–145)
Total Bilirubin: 0.3 mg/dL (ref 0.3–1.2)

## 2011-01-05 LAB — CBC
Hemoglobin: 12.4 g/dL (ref 12.0–15.0)
Hemoglobin: 12.6 g/dL (ref 12.0–15.0)
MCHC: 33.5 g/dL (ref 30.0–36.0)
MCHC: 33.8 g/dL (ref 30.0–36.0)
RBC: 4.2 MIL/uL (ref 3.87–5.11)
RBC: 4.26 MIL/uL (ref 3.87–5.11)
WBC: 8.6 10*3/uL (ref 4.0–10.5)

## 2011-01-05 LAB — PROTIME-INR: INR: 1 (ref 0.00–1.49)

## 2011-01-05 LAB — CARDIAC PANEL(CRET KIN+CKTOT+MB+TROPI)
CK, MB: 5.2 ng/mL — ABNORMAL HIGH (ref 0.3–4.0)
Relative Index: 3.7 — ABNORMAL HIGH (ref 0.0–2.5)
Relative Index: 4 — ABNORMAL HIGH (ref 0.0–2.5)
Troponin I: 0.03 ng/mL (ref 0.00–0.06)
Troponin I: 0.03 ng/mL (ref 0.00–0.06)

## 2011-01-05 LAB — LIPID PANEL
Triglycerides: 56 mg/dL (ref <150)
VLDL: 11 mg/dL (ref 0–40)

## 2011-01-05 LAB — CK TOTAL AND CKMB (NOT AT ARMC)
CK, MB: 10 ng/mL — ABNORMAL HIGH (ref 0.3–4.0)
CK, MB: 11.9 ng/mL — ABNORMAL HIGH (ref 0.3–4.0)
Relative Index: 4.1 — ABNORMAL HIGH (ref 0.0–2.5)
Total CK: 235 U/L — ABNORMAL HIGH (ref 7–177)
Total CK: 291 U/L — ABNORMAL HIGH (ref 7–177)

## 2011-01-05 LAB — TROPONIN I: Troponin I: 0.01 ng/mL (ref 0.00–0.06)

## 2011-01-05 LAB — MAGNESIUM: Magnesium: 2.1 mg/dL (ref 1.5–2.5)

## 2011-01-12 LAB — CK TOTAL AND CKMB (NOT AT ARMC)
CK, MB: 15.9 — ABNORMAL HIGH
Relative Index: 3.5 — ABNORMAL HIGH
Total CK: 113

## 2011-01-12 LAB — CBC
HCT: 28.9 — ABNORMAL LOW
HCT: 29.6 — ABNORMAL LOW
HCT: 31.9 — ABNORMAL LOW
Hemoglobin: 10.8 — ABNORMAL LOW
Hemoglobin: 10.9 — ABNORMAL LOW
MCHC: 33.5
MCHC: 33.8
MCV: 85.1
MCV: 86.2
MCV: 86.4
Platelets: 169
Platelets: 172
Platelets: 185
RBC: 3.35 — ABNORMAL LOW
RBC: 3.48 — ABNORMAL LOW
RBC: 3.75 — ABNORMAL LOW
RBC: 3.76 — ABNORMAL LOW
RDW: 15.3 — ABNORMAL HIGH
RDW: 15.4 — ABNORMAL HIGH
RDW: 15.7 — ABNORMAL HIGH
WBC: 5.7
WBC: 6.4
WBC: 6.4
WBC: 6.5
WBC: 8.7

## 2011-01-12 LAB — HEPARIN LEVEL (UNFRACTIONATED)
Heparin Unfractionated: 0.48
Heparin Unfractionated: 0.72 — ABNORMAL HIGH
Heparin Unfractionated: <0.1 — ABNORMAL LOW

## 2011-01-12 LAB — BASIC METABOLIC PANEL
BUN: 11
BUN: 13
CO2: 27
Calcium: 8.9
Calcium: 9.1
Chloride: 106
Creatinine, Ser: 0.57
Creatinine, Ser: 0.7
GFR calc Af Amer: >60
GFR calc Af Amer: >60
GFR calc non Af Amer: >60
GFR calc non Af Amer: >60
Glucose, Bld: 95
Potassium: 3.7
Sodium: 141

## 2011-01-12 LAB — CARDIAC PANEL(CRET KIN+CKTOT+MB+TROPI)
CK, MB: 10.7 — ABNORMAL HIGH
Total CK: 201 — ABNORMAL HIGH
Total CK: 210 — ABNORMAL HIGH
Troponin I: 0.05

## 2011-01-12 LAB — I-STAT 8, (EC8 V) (CONVERTED LAB)
Acid-Base Excess: 1
Bicarbonate: 26.4 — ABNORMAL HIGH
Chloride: 105
Chloride: 105
HCT: 35 — ABNORMAL LOW
HCT: 35 — ABNORMAL LOW
Hemoglobin: 11.9 — ABNORMAL LOW
Operator id: 270651
Operator id: 272551
TCO2: 28
TCO2: 28
pCO2, Ven: 42.3 — ABNORMAL LOW
pCO2, Ven: 46.6
pH, Ven: 7.36 — ABNORMAL HIGH

## 2011-01-12 LAB — POCT I-STAT CREATININE
Creatinine, Ser: 0.9
Operator id: 272551

## 2011-01-12 LAB — PROTIME-INR: INR: 1

## 2011-01-12 LAB — DIFFERENTIAL
Basophils Absolute: 0
Lymphocytes Relative: 24
Lymphs Abs: 1.6
Monocytes Absolute: 0.7
Neutro Abs: 3.6

## 2011-01-12 LAB — POCT CARDIAC MARKERS
CKMB, poc: 3.6
CKMB, poc: 4.4
CKMB, poc: 17.3
Myoglobin, poc: 124
Operator id: 210721
Operator id: 270651
Troponin i, poc: <0.05
Troponin i, poc: <0.05

## 2011-01-12 LAB — C-REACTIVE PROTEIN: CRP: 0.6 — ABNORMAL HIGH (ref <0.6)

## 2011-01-15 LAB — CBC
HCT: 34.1 — ABNORMAL LOW
Hemoglobin: 11.4 — ABNORMAL LOW
Hemoglobin: 11.7 — ABNORMAL LOW
MCHC: 33.3
MCHC: 33.3
Platelets: 202
RDW: 15.4 — ABNORMAL HIGH
RDW: 15.6 — ABNORMAL HIGH

## 2011-01-15 LAB — CK TOTAL AND CKMB (NOT AT ARMC)
CK, MB: 6.1 — ABNORMAL HIGH
Relative Index: 3.6 — ABNORMAL HIGH
Relative Index: 3.4 — ABNORMAL HIGH
Relative Index: 4.5 — ABNORMAL HIGH
Total CK: 202 — ABNORMAL HIGH
Total CK: 135

## 2011-01-15 LAB — COMPREHENSIVE METABOLIC PANEL
ALT: 33
AST: 37
Calcium: 8.6
GFR calc Af Amer: >60
Glucose, Bld: 96
Sodium: 136
Total Protein: 7.1

## 2011-01-15 LAB — I-STAT 8, (EC8 V) (CONVERTED LAB)
BUN: 24 — ABNORMAL HIGH
Bicarbonate: 25 — ABNORMAL HIGH
Glucose, Bld: 118 — ABNORMAL HIGH
pCO2, Ven: 38.5 — ABNORMAL LOW

## 2011-01-15 LAB — DIFFERENTIAL
Basophils Absolute: 0
Basophils Relative: 1
Eosinophils Relative: 2
Monocytes Absolute: 0.5

## 2011-01-15 LAB — APTT: aPTT: 31

## 2011-01-15 LAB — TROPONIN I
Troponin I: 0.06
Troponin I: 0.1 — ABNORMAL HIGH

## 2011-01-15 LAB — MAGNESIUM: Magnesium: 1.9

## 2011-01-15 LAB — BASIC METABOLIC PANEL
CO2: 30
Chloride: 103
GFR calc non Af Amer: >60
Glucose, Bld: 112 — ABNORMAL HIGH
Potassium: 4.7
Sodium: 137

## 2011-01-15 LAB — POCT CARDIAC MARKERS
CKMB, poc: 7.8
Myoglobin, poc: 151

## 2011-01-15 LAB — TSH: TSH: 2.389

## 2011-01-15 LAB — PROTIME-INR: Prothrombin Time: 13.2

## 2011-01-16 LAB — CBC
HCT: 32.5 — ABNORMAL LOW
HCT: 34.2 — ABNORMAL LOW
Hemoglobin: 11.3 — ABNORMAL LOW
Hemoglobin: 11.5 — ABNORMAL LOW
MCHC: 33.1
MCHC: 33.7
MCHC: 33.8
MCV: 84.3
MCV: 84.7
MCV: 84.8
MCV: 84.8
Platelets: 181
Platelets: 194
Platelets: 205
RBC: 3.86 — ABNORMAL LOW
RBC: 3.95
RDW: 15.5 — ABNORMAL HIGH
WBC: 6.4
WBC: 7.5

## 2011-01-16 LAB — PROTIME-INR
INR: 1
INR: 1
Prothrombin Time: 13.5
Prothrombin Time: 13.7

## 2011-01-16 LAB — COMPREHENSIVE METABOLIC PANEL
AST: 35
Albumin: 3.4 — ABNORMAL LOW
BUN: 10
BUN: 9
CO2: 26
CO2: 28
Calcium: 9
Calcium: 9.2
Calcium: 9.2
Chloride: 102
Creatinine, Ser: 0.66
Creatinine, Ser: 0.72
Creatinine, Ser: 0.76
GFR calc Af Amer: >60
GFR calc Af Amer: >60
GFR calc non Af Amer: >60
GFR calc non Af Amer: >60
Glucose, Bld: 97
Total Bilirubin: 0.8
Total Bilirubin: 0.9
Total Protein: 7

## 2011-01-16 LAB — URINALYSIS, ROUTINE W REFLEX MICROSCOPIC
Hgb urine dipstick: NEGATIVE
Protein, ur: NEGATIVE
Urobilinogen, UA: 0.2

## 2011-01-16 LAB — CARDIAC PANEL(CRET KIN+CKTOT+MB+TROPI)
CK, MB: 5.1 — ABNORMAL HIGH
Relative Index: 5.4 — ABNORMAL HIGH
Total CK: 172
Troponin I: 0.05
Troponin I: 0.05

## 2011-01-16 LAB — AMYLASE: Amylase: 69

## 2011-01-16 LAB — HEPARIN LEVEL (UNFRACTIONATED): Heparin Unfractionated: 0.77 — ABNORMAL HIGH

## 2011-01-16 LAB — CK TOTAL AND CKMB (NOT AT ARMC): Total CK: 281 — ABNORMAL HIGH

## 2011-01-16 LAB — DIFFERENTIAL
Lymphocytes Relative: 22
Lymphs Abs: 1.4
Neutro Abs: 4.2
Neutrophils Relative %: 65

## 2011-01-16 LAB — TSH: TSH: 4.706

## 2011-01-16 LAB — TROPONIN I: Troponin I: 0.05

## 2011-01-16 LAB — APTT: aPTT: 72 — ABNORMAL HIGH

## 2011-08-02 ENCOUNTER — Inpatient Hospital Stay (HOSPITAL_COMMUNITY)
Admission: EM | Admit: 2011-08-02 | Discharge: 2011-08-03 | DRG: 313 | Disposition: A | Discharge status: Home / Self Care | Payer: Medicare Other | Attending: Cardiovascular Disease | Admitting: Cardiovascular Disease

## 2011-08-02 ENCOUNTER — Encounter (HOSPITAL_COMMUNITY): Payer: Self-pay

## 2011-08-02 ENCOUNTER — Other Ambulatory Visit: Payer: Self-pay

## 2011-08-02 ENCOUNTER — Emergency Department (HOSPITAL_COMMUNITY): Payer: Medicare Other

## 2011-08-02 DIAGNOSIS — Z950 Presence of cardiac pacemaker: Secondary | ICD-10-CM

## 2011-08-02 DIAGNOSIS — Z7982 Long term (current) use of aspirin: Secondary | ICD-10-CM

## 2011-08-02 DIAGNOSIS — I2510 Atherosclerotic heart disease of native coronary artery without angina pectoris: Secondary | ICD-10-CM | POA: Diagnosis present

## 2011-08-02 DIAGNOSIS — R079 Chest pain, unspecified: Secondary | ICD-10-CM | POA: Diagnosis present

## 2011-08-02 DIAGNOSIS — E876 Hypokalemia: Secondary | ICD-10-CM | POA: Diagnosis present

## 2011-08-02 DIAGNOSIS — N390 Urinary tract infection, site not specified: Secondary | ICD-10-CM | POA: Diagnosis present

## 2011-08-02 DIAGNOSIS — Z7902 Long term (current) use of antithrombotics/antiplatelets: Secondary | ICD-10-CM

## 2011-08-02 DIAGNOSIS — E039 Hypothyroidism, unspecified: Secondary | ICD-10-CM | POA: Diagnosis present

## 2011-08-02 DIAGNOSIS — K229 Disease of esophagus, unspecified: Secondary | ICD-10-CM | POA: Diagnosis not present

## 2011-08-02 DIAGNOSIS — K219 Gastro-esophageal reflux disease without esophagitis: Secondary | ICD-10-CM | POA: Diagnosis present

## 2011-08-02 DIAGNOSIS — G250 Essential tremor: Secondary | ICD-10-CM | POA: Diagnosis present

## 2011-08-02 DIAGNOSIS — I480 Paroxysmal atrial fibrillation: Secondary | ICD-10-CM | POA: Diagnosis not present

## 2011-08-02 DIAGNOSIS — R0789 Other chest pain: Principal | ICD-10-CM | POA: Diagnosis present

## 2011-08-02 DIAGNOSIS — I10 Essential (primary) hypertension: Secondary | ICD-10-CM | POA: Diagnosis present

## 2011-08-02 HISTORY — DX: Myoneural disorder, unspecified: G70.9

## 2011-08-02 HISTORY — DX: Personal history of other diseases of the digestive system: Z87.19

## 2011-08-02 HISTORY — DX: Urinary tract infection, site not specified: N39.0

## 2011-08-02 HISTORY — DX: Essential tremor: G25.0

## 2011-08-02 HISTORY — DX: Presence of cardiac pacemaker: Z95.0

## 2011-08-02 LAB — CBC
HCT: 33.9 % — ABNORMAL LOW (ref 36.0–46.0)
MCHC: 33 g/dL (ref 30.0–36.0)
RDW: 15 % (ref 11.5–15.5)

## 2011-08-02 LAB — URINALYSIS, ROUTINE W REFLEX MICROSCOPIC
Nitrite: POSITIVE — AB
Protein, ur: NEGATIVE mg/dL
Urobilinogen, UA: 1 mg/dL (ref 0.0–1.0)

## 2011-08-02 LAB — TSH: TSH: 2.274 u[IU]/mL (ref 0.350–4.500)

## 2011-08-02 LAB — HEPARIN LEVEL (UNFRACTIONATED): Heparin Unfractionated: 0.81 IU/mL — ABNORMAL HIGH (ref 0.30–0.70)

## 2011-08-02 LAB — PROTIME-INR
INR: 0.95 (ref 0.00–1.49)
Prothrombin Time: 12.9 seconds (ref 11.6–15.2)

## 2011-08-02 LAB — COMPREHENSIVE METABOLIC PANEL
Albumin: 3.6 g/dL (ref 3.5–5.2)
Alkaline Phosphatase: 141 U/L — ABNORMAL HIGH (ref 39–117)
BUN: 22 mg/dL (ref 6–23)
Creatinine, Ser: 0.86 mg/dL (ref 0.50–1.10)
Potassium: 3.1 mEq/L — ABNORMAL LOW (ref 3.5–5.1)
Total Protein: 6.5 g/dL (ref 6.0–8.3)

## 2011-08-02 LAB — POCT I-STAT TROPONIN I: Troponin i, poc: 0.02 ng/mL (ref 0.00–0.08)

## 2011-08-02 LAB — CARDIAC PANEL(CRET KIN+CKTOT+MB+TROPI)
CK, MB: 2.8 ng/mL (ref 0.3–4.0)
Troponin I: <0.3 ng/mL (ref <0.30)

## 2011-08-02 LAB — URINE MICROSCOPIC-ADD ON

## 2011-08-02 LAB — MAGNESIUM: Magnesium: 2.2 mg/dL (ref 1.5–2.5)

## 2011-08-02 MED ORDER — ALPRAZOLAM 0.25 MG PO TABS: 0.2500 mg | ORAL_TABLET | Freq: Two times a day (BID) | ORAL | Status: DC | PRN

## 2011-08-02 MED ORDER — SIMVASTATIN 20 MG PO TABS
20.0000 mg | ORAL_TABLET | Freq: Every evening | ORAL | Status: DC
  Administered 2011-08-02: 20 mg via ORAL
  Filled 2011-08-02 (×2): qty 1

## 2011-08-02 MED ORDER — NITROGLYCERIN 0.4 MG SL SUBL: 0.4000 mg | SUBLINGUAL_TABLET | SUBLINGUAL | Status: DC | PRN

## 2011-08-02 MED ORDER — POTASSIUM CHLORIDE CRYS ER 20 MEQ PO TBCR
40.0000 meq | EXTENDED_RELEASE_TABLET | Freq: Once | ORAL | Status: AC
  Administered 2011-08-02: 40 meq via ORAL
  Filled 2011-08-02: qty 2

## 2011-08-02 MED ORDER — ASPIRIN 81 MG PO CHEW: 324.0000 mg | CHEWABLE_TABLET | Freq: Once | ORAL | Status: DC

## 2011-08-02 MED ORDER — HEPARIN (PORCINE) IN NACL 100-0.45 UNIT/ML-% IJ SOLN
850.0000 [IU]/h | INTRAMUSCULAR | Status: DC
  Administered 2011-08-02: 900 [IU]/h via INTRAVENOUS
  Filled 2011-08-02 (×3): qty 250

## 2011-08-02 MED ORDER — ONDANSETRON HCL 4 MG/2ML IJ SOLN: 4.0000 mg | Freq: Four times a day (QID) | INTRAMUSCULAR | Status: DC | PRN

## 2011-08-02 MED ORDER — PANTOPRAZOLE SODIUM 40 MG PO TBEC
40.0000 mg | DELAYED_RELEASE_TABLET | Freq: Every day | ORAL | Status: DC
  Administered 2011-08-02 – 2011-08-03 (×2): 40 mg via ORAL
  Filled 2011-08-02 (×2): qty 1

## 2011-08-02 MED ORDER — SODIUM CHLORIDE 0.9 % IV SOLN
1000.0000 mL | INTRAVENOUS | Status: DC
  Administered 2011-08-02: 1000 mL via INTRAVENOUS
  Administered 2011-08-02: 500 mL via INTRAVENOUS

## 2011-08-02 MED ORDER — POTASSIUM CHLORIDE CRYS ER 20 MEQ PO TBCR
20.0000 meq | EXTENDED_RELEASE_TABLET | Freq: Every day | ORAL | Status: DC
  Administered 2011-08-03: 20 meq via ORAL
  Filled 2011-08-02: qty 1

## 2011-08-02 MED ORDER — ACETAMINOPHEN 325 MG PO TABS
650.0000 mg | ORAL_TABLET | ORAL | Status: DC | PRN
  Administered 2011-08-02: 650 mg via ORAL
  Filled 2011-08-02: qty 2

## 2011-08-02 MED ORDER — LORATADINE 10 MG PO TABS
10.0000 mg | ORAL_TABLET | Freq: Every day | ORAL | Status: DC
  Administered 2011-08-02 – 2011-08-03 (×2): 10 mg via ORAL
  Filled 2011-08-02 (×2): qty 1

## 2011-08-02 MED ORDER — FUROSEMIDE 40 MG PO TABS
40.0000 mg | ORAL_TABLET | Freq: Two times a day (BID) | ORAL | Status: DC
  Administered 2011-08-02 – 2011-08-03 (×2): 40 mg via ORAL
  Filled 2011-08-02 (×4): qty 1

## 2011-08-02 MED ORDER — AMIODARONE HCL 200 MG PO TABS
200.0000 mg | ORAL_TABLET | Freq: Every day | ORAL | Status: DC
  Administered 2011-08-02 – 2011-08-03 (×2): 200 mg via ORAL
  Filled 2011-08-02 (×2): qty 1

## 2011-08-02 MED ORDER — LEVOTHYROXINE SODIUM 75 MCG PO TABS
75.0000 ug | ORAL_TABLET | Freq: Every day | ORAL | Status: DC
  Filled 2011-08-02 (×2): qty 1

## 2011-08-02 MED ORDER — METOPROLOL TARTRATE 50 MG PO TABS
75.0000 mg | ORAL_TABLET | Freq: Two times a day (BID) | ORAL | Status: DC
  Administered 2011-08-02 – 2011-08-03 (×2): 75 mg via ORAL
  Filled 2011-08-02 (×3): qty 1

## 2011-08-02 MED ORDER — CALCIUM CARBONATE ANTACID 500 MG PO CHEW
1.0000 | CHEWABLE_TABLET | Freq: Every day | ORAL | Status: DC
  Administered 2011-08-02 – 2011-08-03 (×2): 200 mg via ORAL
  Filled 2011-08-02 (×2): qty 1

## 2011-08-02 MED ORDER — CLOPIDOGREL BISULFATE 75 MG PO TABS
75.0000 mg | ORAL_TABLET | Freq: Every day | ORAL | Status: DC
  Administered 2011-08-02 – 2011-08-03 (×2): 75 mg via ORAL
  Filled 2011-08-02 (×2): qty 1

## 2011-08-02 MED ORDER — TRAMADOL HCL 50 MG PO TABS
50.0000 mg | ORAL_TABLET | Freq: Every day | ORAL | Status: DC
  Administered 2011-08-02: 50 mg via ORAL
  Filled 2011-08-02: qty 1

## 2011-08-02 MED ORDER — SODIUM CHLORIDE 0.9 % IV SOLN: INTRAVENOUS | Status: DC

## 2011-08-02 MED ORDER — ASPIRIN 81 MG PO TBEC
81.0000 mg | DELAYED_RELEASE_TABLET | Freq: Every day | ORAL | Status: DC
Start: 2011-08-02 — End: 2011-08-02

## 2011-08-02 MED ORDER — ASPIRIN EC 81 MG PO TBEC
81.0000 mg | DELAYED_RELEASE_TABLET | Freq: Every day | ORAL | Status: DC
  Administered 2011-08-03: 81 mg via ORAL
  Filled 2011-08-02: qty 1

## 2011-08-02 MED ORDER — LOSARTAN POTASSIUM 50 MG PO TABS
50.0000 mg | ORAL_TABLET | Freq: Every day | ORAL | Status: DC
  Administered 2011-08-02 – 2011-08-03 (×2): 50 mg via ORAL
  Filled 2011-08-02 (×2): qty 1

## 2011-08-02 MED ORDER — SODIUM CHLORIDE 0.9 % IV BOLUS (SEPSIS): 250.0000 mL | INTRAVENOUS | Status: DC | PRN

## 2011-08-02 MED ORDER — HEPARIN BOLUS VIA INFUSION
2000.0000 [IU] | Freq: Once | INTRAVENOUS | Status: AC
  Administered 2011-08-02: 2000 [IU] via INTRAVENOUS

## 2011-08-02 NOTE — ED Notes (Signed)
Pt presents with L sided chest pain that woke her this morning.  +nausea and shortness of breath.  Per EMS, pt was hypotensive on scene (pt had taken NTG x 2 and ASA 81mg  x 4).  NTG x 2 given in route, pt reports pain 4/10 on arrival, was 8/10 at home.

## 2011-08-02 NOTE — ED Provider Notes (Deleted)
9:03 AM  Date: 08/02/2011  Rate: 75  Rhythm: normal sinus rhythm  QRS Axis: normal  Intervals: PR prolonged  ST/T Wave abnormalities: nonspecific T wave changes  Conduction Disutrbances:first-degree A-V block   Narrative Interpretation: Abnormal EKG.  Old EKG Reviewed: changes noted--Showed paced rhythm on 12/21/2010.    Carleene Cooper III, MD 08/02/11 [phone]

## 2011-08-02 NOTE — Progress Notes (Signed)
ANTICOAGULATION CONSULT NOTE - Follow up Consult  Pharmacy Consult for heparin Indication: chest pain  Allergies  Allergen Reactions  . Codeine Other (See Comments)    Passes out  . Isosorbide Mononitrate     REACTION: fainted  . Lorazepam Other (See Comments)    hallucinations  . Prednisone Other (See Comments)    nausea  . Penicillins Rash    Patient Measurements: Height: 5\' 2"  (157.5 cm) Weight: 146 lb 2.6 oz (66.3 kg) IBW/kg (Calculated) : 50.1   Vital Signs: Temp: 98.7 F (37.1 C) (05/02 1553) Temp src: Oral (05/02 1553) BP: 177/72 mmHg (05/02 1553) Pulse Rate: 73  (05/02 1553)  Labs:  Basename 08/02/11 1948 08/02/11 1202 08/02/11 1018  HGB -- -- 11.2*  HCT -- -- 33.9*  PLT -- -- 180  APTT -- -- 25  LABPROT -- -- 12.9  INR -- -- 0.95  HEPARINUNFRC 0.81* -- --  CREATININE -- -- 0.86  CKTOTAL 95 97 --  CKMB 2.8 2.6 --  TROPONINI <0.30 <0.30 --   Estimated Creatinine Clearance: 39.6 ml/min (by C-G formula based on Cr of 0.86).  Medical History: Past Medical History  Diagnosis Date  . Hypertension   . CHF (congestive heart failure)   . A-fib   . Hypothyroid   . GERD (gastroesophageal reflux disease)   . Hypothyroidism   . Bradycardia   . Pancreatitis   . Coronary artery disease   . Shingles   . CORONARY ARTERY DISEASE 11/28/2006    Qualifier: Diagnosis of  By: Tawanna Cooler MD, Eugenio Hoes   . Pacemaker   . Paroxysmal a-fib, history of 08/02/2011  . Tremor, essential 08/02/2011  . Esophageal disorder, with history of dilatation 08/02/2011  . Angina   . H/O hiatal hernia   . Neuromuscular disorder     essential tremors    Medications:  Prescriptions prior to admission  Medication Sig Dispense Refill  . amiodarone (PACERONE) 200 MG tablet Take 200 mg by mouth daily.        [name] aspirin 81 MG EC tablet Take 81 mg by mouth daily.        . calcium carbonate (TUMS - DOSED IN MG ELEMENTAL CALCIUM) 500 MG chewable tablet Chew 1 tablet by mouth daily.       .  clopidogrel (PLAVIX) 75 MG tablet Take 75 mg by mouth daily.        [name] esomeprazole (NEXIUM) 40 MG capsule Take 40 mg by mouth 2 (two) times daily.       . furosemide (LASIX) 40 MG tablet Take 1 tablet (40 mg total) by mouth 2 (two) times daily.  30 tablet  0  . levothyroxine (SYNTHROID, LEVOTHROID) 75 MCG tablet Take 75 mcg by mouth daily.      [name] loratadine (CLARITIN) 10 MG tablet Take 10 mg by mouth daily.        [name] losartan (COZAAR) 50 MG tablet Take 50 mg by mouth daily.        . metoprolol (LOPRESSOR) 50 MG tablet Take 75 mg by mouth 2 (two) times daily. Takes 1.5 tablet twice daily      . Multiple Vitamin (MULTIVITAMIN) capsule Take 1 capsule by mouth daily.        . Multiple Vitamins-Minerals (PRESERVISION/LUTEIN) CAPS Take by mouth 2 (two) times daily.        . potassium chloride SA (K-DUR,KLOR-CON) 20 MEQ tablet Take 20 mEq by mouth daily.       . simvastatin (ZOCOR) 20  MG tablet Take 20 mg by mouth every evening.      . traMADol (ULTRAM) 50 MG tablet Take 50 mg by mouth at bedtime.      . nitroGLYCERIN (NITROSTAT) 0.4 MG SL tablet Place 0.4 mg under the tongue every 5 (five) minutes as needed.          Assessment: 8 hr Heparin level = 0.81 on IV heparin drip 900 units/hr in this 78 yo female for chest pain.  Heparin level is above goal, but level was drawn ~2hrs earlier than desired/ likely still reflects some of bolus due to patient's age. No bleeding reported.  Will adjust IV heparin rate slightly as I expect heparin level to drift into therapeutic range.   Goal of Therapy:  Heparin level 0.3-0.7 units/ml   Plan:  Decrease Heparin rate to 850 units/hr. Check heparin level /CBC in AM. .   Arman Filter , RPh 08/02/2011,9:26 PM

## 2011-08-02 NOTE — ED Provider Notes (Signed)
History     CSN: [id number]  Arrival date & time 08/02/11  2440   First MD Initiated Contact with Patient 08/02/11 [phone]      Chief Complaint  Patient presents with  . Chest Pain    (Consider location/radiation/quality/duration/timing/severity/associated sxs/prior treatment) HPI Comments: The patient is an 78 year old woman who had onset of pain in the left chest around 7 AM. It radiated into both arms. Her son gave her 2 nitroglycerin and 4 baby aspirins, and her pain went down to approximately a 2.  Her initial pain was rated at an 8.  Him hypotensive after the second nitroglycerin, but her pressure has recovered now. She has a prior history of coronary artery disease, with several prior stents. She also has a pacemaker. Her cardiologist is Susa Griffins M.D.   Patient is a 78 y.o. female presenting with chest pain. The history is provided by the patient.  Chest Pain The chest pain began 1 - 2 hours ago. Duration of episode(s) is 20 minutes. The chest pain is improving. Associated with: Nothing\ At its most intense, the pain is at 8/10. The pain is currently at 2/10. The severity of the pain is severe. The quality of the pain is described as sharp and similar to previous episodes. Radiates to: Her pain radiated to both arms. Exacerbated by: Nothing. Primary symptoms include shortness of breath. Pertinent negatives for primary symptoms include no fever, no syncope, no nausea and no vomiting.  Associated symptoms include diaphoresis and weakness. She tried nitroglycerin and aspirin for the symptoms. Risk factors include post-menopausal and sedentary lifestyle (Known coronary artery disease.).  Her past medical history is significant for CAD.  Procedure history is positive for cardiac catheterization.     Past Medical History  Diagnosis Date  . Hypertension   . CHF (congestive heart failure)   . A-fib   . Hypothyroid   . GERD (gastroesophageal reflux disease)   . Hypothyroidism   .  Bradycardia   . Pancreatitis   . Coronary artery disease   . Shingles     Past Surgical History  Procedure Date  . Permanent pacemaker   . S/p total hyserectomy and left oophorectomy   . S/p cholecystectomy   . Repair spigelian hernia   . S/p childbirth     x2  . S/p cataract extraction with lens implant   . S/p appendectomy   . Splint 2008    No family history on file.  History  Substance Use Topics  . Smoking status: Never Smoker   . Smokeless tobacco: Not on file  . Alcohol Use:     OB History    Grav Para Term Preterm Abortions TAB SAB Ect Mult Living                  Review of Systems  Constitutional: Positive for diaphoresis. Negative for fever and chills.  HENT: Negative.   Eyes: Negative.   Respiratory: Positive for shortness of breath.   Cardiovascular: Positive for chest pain. Negative for syncope.  Gastrointestinal: Negative.  Negative for nausea and vomiting.  Genitourinary: Negative.   Musculoskeletal: Negative.   Neurological: Positive for tremors and weakness.    Allergies  Codeine; Isosorbide mononitrate; Lorazepam; Prednisone; and Penicillins  Home Medications   Current Outpatient Rx  Name Route Sig Dispense Refill  . AMIODARONE HCL 200 MG PO TABS Oral Take 200 mg by mouth daily.      . ASPIRIN 81 MG PO TBEC Oral Take 81 mg by  mouth daily.      [name] CALCIUM CARBONATE ANTACID 500 MG PO CHEW Oral Chew 1 tablet by mouth daily.     [name] CLOPIDOGREL BISULFATE 75 MG PO TABS Oral Take 75 mg by mouth daily.      [name] ESOMEPRAZOLE MAGNESIUM 40 MG PO CPDR Oral Take 40 mg by mouth 2 (two) times daily.     . FUROSEMIDE 40 MG PO TABS Oral Take 1 tablet (40 mg total) by mouth 2 (two) times daily. 30 tablet 0  . LEVOTHYROXINE SODIUM 75 MCG PO TABS Oral Take 75 mcg by mouth daily.    [name] LORATADINE 10 MG PO TABS Oral Take 10 mg by mouth daily.      [name] LOSARTAN POTASSIUM 50 MG PO TABS Oral Take 50 mg by mouth daily.      . MULTIVITAMINS PO CAPS Oral Take 1 capsule by  mouth daily.      [name] PRESERVISION/LUTEIN PO CAPS Oral Take by mouth 2 (two) times daily.      [name] POTASSIUM CHLORIDE CRYS ER 20 MEQ PO TBCR Oral Take 20 mEq by mouth daily.     [name] SIMVASTATIN 20 MG PO TABS Oral Take 20 mg by mouth every evening.    [name] DILTIAZEM HCL 120 MG PO TABS Oral Take 120 mg by mouth daily.      [name] FOLIC ACID 1 MG PO TABS Oral Take 1 mg by mouth. 2 tabs daily     . POLYSACCHARIDE IRON COMPLEX 150 MG PO CAPS Oral Take 150 mg by mouth daily.      [name] METOPROLOL TARTRATE 100 MG PO TABS Oral Take 100 mg by mouth 2 (two) times daily.      [name] NITROGLYCERIN 0.4 MG SL SUBL Sublingual Place 0.4 mg under the tongue every 5 (five) minutes as needed.        BP 158/53  Pulse 70  Temp(Src) 98.2 F (36.8 C) (Oral)  Resp 12  SpO2 100%  Physical Exam  Nursing note and vitals reviewed. Constitutional: She is oriented to person, place, and time. She appears well-developed and well-nourished.       She has a resting tremor in her jaw.  HENT:  Head: Normocephalic and atraumatic.  Right Ear: External ear normal.  Left Ear: External ear normal.  Mouth/Throat: Oropharynx is clear and moist.  Eyes: Conjunctivae and EOM are normal. Pupils are equal, round, and reactive to light.  Neck: Normal range of motion. Neck supple.  Cardiovascular: Normal rate, regular rhythm and normal heart sounds.   Pulmonary/Chest: Effort normal and breath sounds normal.  Abdominal: Soft. Bowel sounds are normal.  Musculoskeletal: Normal range of motion. She exhibits no edema and no tenderness.  Neurological: She is alert and oriented to person, place, and time.       No sensory or motor deficit.  She has a tremor in her jaw.  Skin: Skin is warm and dry.  Psychiatric: She has a normal mood and affect. Her behavior is normal.    ED Course  Procedures (including critical care time)  9:03 AM  Date: 08/02/2011  Rate: 75  Rhythm: normal sinus rhythm  QRS Axis: normal  Intervals: PR prolonged  ST/T Wave  abnormalities: nonspecific T wave changes  Conduction Disutrbances:first-degree A-V block   Narrative Interpretation: Abnormal EKG.  Old EKG Reviewed: changes noted--Showed paced rhythm on 12/21/2010.   10:18 AM Pt was seen and had physical exam.  EKG was non-acute.  Lab workup ordered.  Case discussed  with Bryan Lemma, M.D., on call for [facility], who will see and admit pt.   1. Chest pain   2. Coronary artery disease             Carleene Cooper III, MD 08/02/11 1030

## 2011-08-02 NOTE — Progress Notes (Signed)
ANTICOAGULATION CONSULT NOTE - Initial Consult  Pharmacy Consult for heparin Indication: chest pain  Allergies  Allergen Reactions  . Codeine Other (See Comments)    Passes out  . Isosorbide Mononitrate     REACTION: fainted  . Lorazepam Other (See Comments)    hallucinations  . Prednisone Other (See Comments)    nausea  . Penicillins Rash    Patient Measurements: Height: 5\' 2"  (157.5 cm) Weight: 146 lb (66.225 kg) IBW/kg (Calculated) : 50.1   Vital Signs: Temp: 98.2 F (36.8 C) (05/02 0859) Temp src: Oral (05/02 0859) BP: 138/55 mmHg (05/02 1140) Pulse Rate: 71  (05/02 1140)  Labs:  Basename 08/02/11 1018  HGB 11.2*  HCT 33.9*  PLT 180  APTT 25  LABPROT 12.9  INR 0.95  HEPARINUNFRC --  CREATININE 0.86  CKTOTAL --  CKMB --  TROPONINI --   Estimated Creatinine Clearance: 39.6 ml/min (by C-G formula based on Cr of 0.86).  Medical History: Past Medical History  Diagnosis Date  . Hypertension   . CHF (congestive heart failure)   . A-fib   . Hypothyroid   . GERD (gastroesophageal reflux disease)   . Hypothyroidism   . Bradycardia   . Pancreatitis   . Coronary artery disease   . Shingles   . CORONARY ARTERY DISEASE 11/28/2006    Qualifier: Diagnosis of  By: Tawanna Cooler MD, Eugenio Hoes   . Pacemaker   . Paroxysmal a-fib, history of 08/02/2011  . Tremor, essential 08/02/2011  . Esophageal disorder, with history of dilatation 08/02/2011    Medications:   (Not in a hospital admission)  Assessment: 78 yo lady to start heparin for chest pain Goal of Therapy:  Heparin level 0.3-0.7 units/ml   Plan:  Heparin bolus 2000 units and drip at 900 units/hr. Check heparin level 8 hours after start. F/u daily CBC and heparin level while on heparin.  Talbert Cage Poteet 08/02/2011,12:19 PM

## 2011-08-02 NOTE — H&P (Signed)
[name] Luna is an 78 y.o. female.   Chief Complaint: chest pain HPI: [name] is an 78 year old female, followed by Dr. Alanda Amass. She has history of coronary artery disease. She had an LAD drug-eluting stent in 2008 and a new LAD drug-eluting stent placed in October 2011. Catheterization in June 2012, at that time, she had a 75% in-stent restenosis to the LAD, which was treated with re-stenting with a Resolute stent. She was admitted again, December 04, 2010 to December 08, 2010 with chest pain. At that time, her blood pressure was labile and it was felt her pain may have been secondary to diastolic dysfunction. Her BNP was 459. She did have a Myoview study that was negative for ischemia and an echocardiogram which showed good LV function with diastolic dysfunction, grade 1. Last cath in 12/2010 after again presenting with chest pain, revealed Patent proximal LAD stent and no obstructive CAD. She did have elevated LVEDP with increased BPs which was thought to be contributing to chest pain.      This AM  Lt. Chest pain (described as chest pressure) around 7 AM awoke her from sleep with radiation to both arms, and some radiation to head.  She also had Nausea, diaphoresis and SOB.  She was given a total of 1 NTG at home with drop in BP systolic from 140 to 60 with near syncope.  Also given 4 baby ASA and pain decreased to a 2 from initial pain level of 8.  Currently in ER pain resolved, mild residual nausea, and occ brief chest discomfort that comes and goes.  Prior to this AM she has had no chest pain since last Sept.   Pt. Also has PPM and this was interrogated 04/05/11.  Medtronic device for SSS with hx. of PAF.    Other history does include esophageal reflux and esophageal dilatation in Feb. 2012.   Past Medical History  Diagnosis Date  . Hypertension   . CHF (congestive heart failure)   . A-fib   . Hypothyroid   . GERD (gastroesophageal reflux disease)   . Hypothyroidism   . Bradycardia     . Pancreatitis   . Coronary artery disease   . Shingles   . CORONARY ARTERY DISEASE 11/28/2006    Qualifier: Diagnosis of  By: Tawanna Cooler MD, Eugenio Hoes   . Pacemaker     Past Surgical History  Procedure Date  . Permanent pacemaker   . S/p total hyserectomy and left oophorectomy   . S/p cholecystectomy   . Repair spigelian hernia   . S/p childbirth     x2  . S/p cataract extraction with lens implant   . S/p appendectomy   . Splint 2008  . Coronary angioplasty   . Insert / replace / remove pacemaker     No family history on file.  Family history was unremarkable. Social History:  reports that she has never smoked. She does not have any smokeless tobacco history on file. Her alcohol and drug histories not on file.  She is a widow, 2 children, 4 GC,  5 GGC, and she lives with her son.   Allergies:  Allergies  Allergen Reactions  . Codeine Other (See Comments)    Passes out  . Isosorbide Mononitrate     REACTION: fainted  . Lorazepam Other (See Comments)    hallucinations  . Prednisone Other (See Comments)    nausea  . Penicillins Rash   Outpatient medications: 1. Amiodarone 200 mg daily. 2. Aspirin  81 mg daily. 3. Plavix 75 mg daily 4. Nexium 40 mg daily 5. Levothyroxine 75 mcg daily 6. Nitroglycerin sublingual when necessary  7. Lasix 40 mg one, twice a day 8. Potassium 20 mEq daily 9. Simvastatin 20 mg one every evening 10.Tums to one tablet daily 11. Cozaar 50 mg daily 12. Claritin 10 mg daily 13. Multivitamin daily 14. Tramadol 50 mg as needed at bedtime.  (Not in a hospital admission)  Results for orders placed during the hospital encounter of 08/02/11 (from the past 48 hour(s))  CBC     Status: Abnormal   Collection Time   08/02/11 10:18 AM      Component Value Range Comment   WBC 8.5  4.0 - 10.5 (K/uL)    RBC 3.76 (*) 3.87 - 5.11 (MIL/uL)    Hemoglobin 11.2 (*) 12.0 - 15.0 (g/dL)    HCT 16.1 (*) 09.6 - 46.0 (%)    MCV 90.2  78.0 - 100.0 (fL)    MCH 29.8   26.0 - 34.0 (pg)    MCHC 33.0  30.0 - 36.0 (g/dL)    RDW 04.5  40.9 - 81.1 (%)    Platelets 180  150 - 400 (K/uL)   COMPREHENSIVE METABOLIC PANEL     Status: Abnormal   Collection Time   08/02/11 10:18 AM      Component Value Range Comment   Sodium 142  135 - 145 (mEq/L)    Potassium 3.1 (*) 3.5 - 5.1 (mEq/L)    Chloride 103  96 - 112 (mEq/L)    CO2 31  19 - 32 (mEq/L)    Glucose, Bld 104 (*) 70 - 99 (mg/dL)    BUN 22  6 - 23 (mg/dL)    Creatinine, Ser 9.14  0.50 - 1.10 (mg/dL)    Calcium 9.1  8.4 - 10.5 (mg/dL)    Total Protein 6.5  6.0 - 8.3 (g/dL)    Albumin 3.6  3.5 - 5.2 (g/dL)    AST 23  0 - 37 (U/L)    ALT 18  0 - 35 (U/L)    Alkaline Phosphatase 141 (*) 39 - 117 (U/L)    Total Bilirubin 0.4  0.3 - 1.2 (mg/dL)    GFR calc non Af Amer 58 (*) >90 (mL/min)    GFR calc Af Amer 67 (*) >90 (mL/min)   PROTIME-INR     Status: Normal   Collection Time   08/02/11 10:18 AM      Component Value Range Comment   Prothrombin Time 12.9  11.6 - 15.2 (seconds)    INR 0.95  0.00 - 1.49    APTT     Status: Normal   Collection Time   08/02/11 10:18 AM      Component Value Range Comment   aPTT 25  24 - 37 (seconds)   POCT I-STAT TROPONIN I     Status: Normal   Collection Time   08/02/11 10:20 AM      Component Value Range Comment   Troponin i, poc 0.02  0.00 - 0.08 (ng/mL)    Comment 3             Dg Chest Portable 1 View  08/02/2011  *RADIOLOGY REPORT*  Clinical Data: Chest pain.  PORTABLE CHEST - 1 VIEW  Comparison: Chest x-ray 12/21/2010.  Findings: Lung volumes are normal.  No consolidative airspace disease.  No definite pleural effusions.  The heart is moderately enlarged (unchanged).  Mediastinal contours are unremarkable. Extensive  atherosclerosis of the thoracic aorta is again noted.  A right-sided pacemaker device is in place with lead tips projecting over the expected location of the right atrium and right ventricular apex.  IMPRESSION: 1.  No definite radiographic evidence of acute  cardiopulmonary disease. 2.  Moderate cardiomegaly again noted. 3.  Atherosclerosis.  Original Report Authenticated By: Florencia Reasons, M.D.    ROS: General:No recent colds or fevers no weight changes Skin: Warm and dry brisk capillary refill No rashes or ulcers HEENT: The sinus infections no visual changes AV:WUJWJ pain As per history of present illness XBJ:[id number] of breath this morning but otherwise no complaints of this GI:No diarrhea constipation or melena, no trouble swallowing GU:No hematuria or dysuria MS:No complaints of joint pain Neuro:No syncope lightheadedness or dizziness Endo:No diabetes and thyroid disease is managed.   Blood pressure 148/58, pulse 70, temperature 98.2 F (36.8 C), temperature source Oral, resp. rate 12, height 5\' 2"  (1.575 m), weight 66.225 kg (146 lb), SpO2 100.00%. PE: General:Alert oriented white female currently in no acute distress pleasant affect Skin:Warm and dry brisk Capillary refill HEENT:Normocephalic sclera clear Neck:Supple no JVD no carotid bruits 2+ carotid upstroke Heart:S1-S2 regular rate and rhythm with a soft 1/6 systolic murmur Lungs:Clear without rales rhonchi or wheezes HYQ:MVHQ nontender positive bowel sounds do not palpate liver spleen or masses Ext:No edema, 2+ pedal pulses bilaterally, varicosities are present. Neuro:Alert and oriented x3 follows commands moves all extremities.   EKG sinus rhythm first-degree block and no acute EKG changes  Assessment/Plan Patient Active Problem List  Diagnoses  . HYPOTHYROIDISM  . PRESENILE DEMENTIA WITH DEPRESSIVE FEATURES  . HYPERTENSION  . CORONARY ARTERY DISEASE  . GERD  . ECZEMA  . SYMPTOM, SYNCOPE AND COLLAPSE  . NAUSEA  . PANCREATITIS, HX OF  . Dyspnea  . Hypokalemia  . Anemia  . Urinary frequency  . UTI (urinary tract infection)  . Chest pain at rest   PLAN: Admit to telemetry IV heparin, will hold off on IV nitroglycerin with hypotension with sublingual  nitroglycerin at home and history of intolerance to IMdur. If cardiac enzymes are positive may need cardiac catheterization.  If no further chest pain and enzymes negative may be discharged tomorrow as outpatient nuc study versus recurrent pain and nuc study here in the hospital.   INGOLD,LAURA R 08/02/2011, 10:57 AM   Patient seen and examined. Agree with assessment and plan. Very pleasant 78 yo female who has known CAD. She is s/p prior LAD stenting in 2008 ,2011 and in 09/2010 required repeat PCI for in-stent restenosis. She has been doing well without exertional chest pain.  Today she developed sharp chest pain with arm radiation while in bed which was different from her prior chest tightness. Initial cardiac enzymes are negative, but K is low. ECG shows nondiagnostic T changes anteriorly. Pt took NTG but developed transient hypotension. BP is no longer low. Will cycle enzymes and if positive or ECG evolves plan cath; otherwise, initial noninvasive strategy.  Lennette Bihari, MD, [facility] 08/02/2011 1:38 PM

## 2011-08-03 ENCOUNTER — Encounter (HOSPITAL_COMMUNITY): Payer: Self-pay | Admitting: Cardiology

## 2011-08-03 DIAGNOSIS — N390 Urinary tract infection, site not specified: Secondary | ICD-10-CM

## 2011-08-03 HISTORY — DX: Urinary tract infection, site not specified: N39.0

## 2011-08-03 LAB — LIPID PANEL
Cholesterol: 141 mg/dL (ref 0–200)
HDL: 62 mg/dL (ref >39)
LDL Cholesterol: 67 mg/dL (ref 0–99)
Triglycerides: 58 mg/dL (ref <150)
VLDL: 12 mg/dL (ref 0–40)

## 2011-08-03 LAB — CBC
MCH: 29.4 pg (ref 26.0–34.0)
MCV: 90.5 fL (ref 78.0–100.0)
Platelets: 165 10*3/uL (ref 150–400)
RDW: 15.1 % (ref 11.5–15.5)

## 2011-08-03 LAB — BASIC METABOLIC PANEL
CO2: 27 mEq/L (ref 19–32)
Calcium: 9 mg/dL (ref 8.4–10.5)
Chloride: 105 mEq/L (ref 96–112)
Creatinine, Ser: 0.7 mg/dL (ref 0.50–1.10)
Glucose, Bld: 93 mg/dL (ref 70–99)

## 2011-08-03 LAB — CARDIAC PANEL(CRET KIN+CKTOT+MB+TROPI): Total CK: 105 U/L (ref 7–177)

## 2011-08-03 MED ORDER — SULFAMETHOXAZOLE-TRIMETHOPRIM 400-80 MG PO TABS: 1.0000 | ORAL_TABLET | Freq: Two times a day (BID) | ORAL | Status: AC

## 2011-08-03 MED ORDER — SULFAMETHOXAZOLE-TRIMETHOPRIM 400-80 MG PO TABS
1.0000 | ORAL_TABLET | Freq: Two times a day (BID) | ORAL | Status: DC
  Filled 2011-08-03 (×2): qty 1

## 2011-08-03 NOTE — Discharge Instructions (Signed)
Heart healthy diet  Call if recurrent chest pain.

## 2011-08-03 NOTE — Progress Notes (Signed)
ANTICOAGULATION CONSULT NOTE - Follow up Consult  Pharmacy Consult for heparin Indication: chest pain  Allergies  Allergen Reactions  . Codeine Other (See Comments)    Passes out  . Isosorbide Mononitrate     REACTION: fainted  . Lorazepam Other (See Comments)    hallucinations  . Prednisone Other (See Comments)    nausea  . Penicillins Rash    Patient Measurements: Height: 5\' 2"  (157.5 cm) Weight: 146 lb 2.6 oz (66.3 kg) IBW/kg (Calculated) : 50.1   Vital Signs: Temp: 97.9 F (36.6 C) (05/03 0500) Temp src: Oral (05/03 0500) BP: 184/96 mmHg (05/03 0500) Pulse Rate: 88  (05/03 0500)  Labs:  Basename 08/03/11 0946 08/03/11 0352 08/02/11 1948 08/02/11 1202 08/02/11 1018  HGB 12.3 -- -- -- 11.2*  HCT 37.9 -- -- -- 33.9*  PLT 165 -- -- -- 180  APTT -- -- -- -- 25  LABPROT -- -- -- -- 12.9  INR -- -- -- -- 0.95  HEPARINUNFRC 1.23* -- 0.81* -- --  CREATININE -- 0.70 -- -- 0.86  CKTOTAL -- 105 95 97 --  CKMB -- 3.4 2.8 2.6 --  TROPONINI -- <0.30 <0.30 <0.30 --   Estimated Creatinine Clearance: 42.6 ml/min (by C-G formula based on Cr of 0.7).  Medical History: Past Medical History  Diagnosis Date  . Hypertension   . CHF (congestive heart failure)   . A-fib   . Hypothyroid   . GERD (gastroesophageal reflux disease)   . Hypothyroidism   . Bradycardia   . Pancreatitis   . Coronary artery disease   . Shingles   . CORONARY ARTERY DISEASE 11/28/2006    Qualifier: Diagnosis of  By: Tawanna Cooler MD, Eugenio Hoes   . Pacemaker   . Paroxysmal a-fib, history of 08/02/2011  . Tremor, essential 08/02/2011  . Esophageal disorder, with history of dilatation 08/02/2011  . Angina   . H/O hiatal hernia   . Neuromuscular disorder     essential tremors    Medications:  Prescriptions prior to admission  Medication Sig Dispense Refill  . amiodarone (PACERONE) 200 MG tablet Take 200 mg by mouth daily.        [name] aspirin 81 MG EC tablet Take 81 mg by mouth daily.        . calcium carbonate  (TUMS - DOSED IN MG ELEMENTAL CALCIUM) 500 MG chewable tablet Chew 1 tablet by mouth daily.       . clopidogrel (PLAVIX) 75 MG tablet Take 75 mg by mouth daily.        [name] esomeprazole (NEXIUM) 40 MG capsule Take 40 mg by mouth 2 (two) times daily.       . furosemide (LASIX) 40 MG tablet Take 1 tablet (40 mg total) by mouth 2 (two) times daily.  30 tablet  0  . levothyroxine (SYNTHROID, LEVOTHROID) 75 MCG tablet Take 75 mcg by mouth daily.      [name] loratadine (CLARITIN) 10 MG tablet Take 10 mg by mouth daily.        [name] losartan (COZAAR) 50 MG tablet Take 50 mg by mouth daily.        . metoprolol (LOPRESSOR) 50 MG tablet Take 75 mg by mouth 2 (two) times daily. Takes 1.5 tablet twice daily      . Multiple Vitamin (MULTIVITAMIN) capsule Take 1 capsule by mouth daily.        . Multiple Vitamins-Minerals (PRESERVISION/LUTEIN) CAPS Take by mouth 2 (two) times daily.        [name]  potassium chloride SA (K-DUR,KLOR-CON) 20 MEQ tablet Take 20 mEq by mouth daily.       . simvastatin (ZOCOR) 20 MG tablet Take 20 mg by mouth every evening.      . traMADol (ULTRAM) 50 MG tablet Take 50 mg by mouth at bedtime.      . nitroGLYCERIN (NITROSTAT) 0.4 MG SL tablet Place 0.4 mg under the tongue every 5 (five) minutes as needed.          Assessment: Heparin drip 850 uts/hr HL 1.23 MD planning on d/c heparin - will have RN turn it off. No bleeding noted drawn in opposite arm as heparin infusion.  Goal of Therapy:  Heparin level 0.3-0.7 units/ml   Plan:  above   Marcelino Scot , RPh 08/03/2011,12:09 PM

## 2011-08-03 NOTE — Progress Notes (Signed)
Pt. Seen and examined. Agree with the NP/PA-C note as written.  No chest pain now. Pain was per her report, atypical. UA is positive, however, suspect asymptomatic bacteruria, however, many WBC's. She has no complaints of burning or increased urinary frequency. Would recommend outpatient stress test next week. Short course of Bactrim for UTI. D/C home today. Follow-up with Dr. Alanda Amass.  Chrystie Nose, MD, [facility] Attending Cardiologist [facility]

## 2011-08-03 NOTE — Progress Notes (Signed)
Subjective: No chest pain.  + u/a   Objective: Vital signs in last 24 hours: Temp:  [97.9 F (36.6 C)-98.7 F (37.1 C)] 97.9 F (36.6 C) (05/03 0500) Pulse Rate:  [70-88] 88  (05/03 0500) Resp:  [12-20] 18  (05/03 0500) BP: (138-184)/(53-96) 184/96 mmHg (05/03 0500) SpO2:  [94 %-100 %] 96 % (05/03 0500) Weight:  [66.225 kg (146 lb)-66.3 kg (146 lb 2.6 oz)] 66.3 kg (146 lb 2.6 oz) (05/02 1553) Weight change:  Last BM Date: 08/01/11 Intake/Output from previous day: -1260 05/02 0701 - 05/03 0700 In: 240 [P.O.:240] Out: 1500 [Urine:1500] Intake/Output this shift:    PE: General:alert and oriented, no further pain since admit. Heart:S1S2 RRR Lungs:clear Abd:+ BS soft, non tender Ext:no edema Neuro:alert and oriented, MAE  TELE:  AV pacing   Lab Results:  Basename 08/02/11 1018  WBC 8.5  HGB 11.2*  HCT 33.9*  PLT 180   BMET  Basename 08/03/11 0352 08/02/11 1018  NA 141 142  K 3.5 3.1*  CL 105 103  CO2 27 31  GLUCOSE 93 104*  BUN 14 22  CREATININE 0.70 0.86  CALCIUM 9.0 9.1    Basename 08/03/11 0352 08/02/11 1948  TROPONINI <0.30 <0.30    Lab Results  Component Value Date   CHOL 141 08/03/2011   HDL 62 08/03/2011   LDLCALC 67 08/03/2011   TRIG 58 08/03/2011   CHOLHDL 2.3 08/03/2011   Lab Results  Component Value Date   HGBA1C 5.4 12/04/2010     Lab Results  Component Value Date   TSH 2.274 08/02/2011    Hepatic Function Panel  Basename 08/02/11 1018  PROT 6.5  ALBUMIN 3.6  AST 23  ALT 18  ALKPHOS 141*  BILITOT 0.4  BILIDIR --  IBILI --    Basename 08/03/11 0352  CHOL 141   No results found for this basename: PROTIME in the last 72 hours    EKG: Orders placed during the hospital encounter of 08/02/11  . ED EKG  . ED EKG  . EKG 12-LEAD  . EKG 12-LEAD  . EKG 12-LEAD    Studies/Results: Dg Chest Portable 1 View  08/02/2011  *RADIOLOGY REPORT*  Clinical Data: Chest pain.  PORTABLE CHEST - 1 VIEW  Comparison: Chest x-ray 12/21/2010.   Findings: Lung volumes are normal.  No consolidative airspace disease.  No definite pleural effusions.  The heart is moderately enlarged (unchanged).  Mediastinal contours are unremarkable. Extensive atherosclerosis of the thoracic aorta is again noted.  A right-sided pacemaker device is in place with lead tips projecting over the expected location of the right atrium and right ventricular apex.  IMPRESSION: 1.  No definite radiographic evidence of acute cardiopulmonary disease. 2.  Moderate cardiomegaly again noted. 3.  Atherosclerosis.  Original Report Authenticated By: Florencia Reasons, M.D.    Medications: I have reviewed the patient's current medications. Patient Active Problem List  Diagnoses  . HYPOTHYROIDISM  . PRESENILE DEMENTIA WITH DEPRESSIVE FEATURES  . HYPERTENSION  . CORONARY ARTERY DISEASE  . GERD  . ECZEMA  . SYMPTOM, SYNCOPE AND COLLAPSE  . NAUSEA  . PANCREATITIS, HX OF  . Dyspnea  . Hypokalemia  . Anemia  . Urinary frequency  . UTI (urinary tract infection)  . Chest pain at rest  . H/O cardiac pacemaker, Medtronic versa implanted 01/2008, though original implanted 2000 for syncope and heart block and ventricular asystole.  . Paroxysmal a-fib, history of, off coumadin since 10/2010, without recurrence of afib.  [name]  Tremor, essential  . Esophageal disorder, with history of dilatation    Assessment/Plan: Patient Active Problem List  Diagnoses  . HYPOTHYROIDISM  . PRESENILE DEMENTIA WITH DEPRESSIVE FEATURES  . HYPERTENSION  . CORONARY ARTERY DISEASE  . GERD  . ECZEMA  . SYMPTOM, SYNCOPE AND COLLAPSE  . NAUSEA  . PANCREATITIS, HX OF  . Dyspnea  . Hypokalemia  . Anemia  . Urinary frequency  . UTI (urinary tract infection)  . Chest pain at rest  . H/O cardiac pacemaker, Medtronic versa implanted 01/2008, though original implanted 2000 for syncope and heart block and ventricular asystole.  . Paroxysmal a-fib, history of, off coumadin since 10/2010, without  recurrence of afib.  . Tremor, essential  . Esophageal disorder, with history of dilatation   PLAN:  Cardiac enzymes, negative.  Will d/c heparin and ambulate.  If stable ? Outpatient nuc?    Hypokalemia improved.  BP elevated this AM, higher than with her chest pain yesterday. + u/a, pt does not complain of dysuria.  Add  Antibiotic?  LOS: 1 day   Cordarryl Monrreal R 08/03/2011, 9:31 AM

## 2011-08-03 NOTE — Discharge Summary (Signed)
Tacy Chavis C. Ayush Boulet, MD, FACC Attending Cardiologist [facility]  

## 2011-08-03 NOTE — Discharge Summary (Signed)
Physician Discharge Summary  Patient ID: [name] Luna MRN: [id number] DOB/AGE: [id number] 76 y.o.  Admit date: 08/02/2011 Discharge date: 08/03/2011  Discharge Diagnoses:  Principal Problem:  *Chest pain at rest, negative MI, plan outpt. nuc study Active Problems:  CORONARY ARTERY DISEASE  Hypokalemia  UTI (lower urinary tract infection)  H/O cardiac pacemaker, Medtronic versa implanted 01/2008, though original implanted 2000 for syncope and heart block and ventricular asystole.  Paroxysmal a-fib, history of, off coumadin since 10/2010, without recurrence of afib.  Tremor, essential  Esophageal disorder, with history of dilatation   Discharged Condition: good  Hospital Course: [name] Luna is an 78 year old female, followed by Dr. Alanda Amass. She has history of coronary artery disease. She had an LAD drug-eluting stent in 2008 and a new LAD drug-eluting stent placed in October 2011. Catheterization in June 2012, at that time, she had a 75% in-stent restenosis to the LAD, which was treated with re-stenting with a Resolute stent. She was admitted again, December 04, 2010 to December 08, 2010 with chest pain. At that time, her blood pressure was labile and it was felt her pain may have been secondary to diastolic dysfunction. Her BNP was 459. She did have a Myoview study that was negative for ischemia and an echocardiogram which showed good LV function with diastolic dysfunction, grade 1. Last cath in 12/2010 after again presenting with chest pain, revealed Patent proximal LAD stent and no obstructive CAD. She did have elevated LVEDP with increased BPs which was thought to be contributing to chest pain.    She presented to the emergency room 08/02/2011 with chest pain that awakened her from sleep at 7 AM with radiation to both arms and some into her head. The pain was associated with nausea diaphoresis and shortness of breath she took one nitroglycerin at home while her blood pressure had been 140  systolic prior to the nitroglycerin, taken by her son, it dropped to 60 and the patient developed near syncope. She also received 4 baby aspirin. Her pain resolved though in the emergency room she would have brief episodes of mild chest discomfort.  Her blood pressure at this time it come back. Patient prior to this episode been doing quite well and had actually not had any problems since a hospitalization in September 2012.  She was admitted to telemetry, added IV heparin was added, no nitroglycerin has this has caused hypotension.  She did well overnight cardiac enzymes were negative heparin was DC'd.  Patient ambulated without further complications. She was seen by Dr. Rennis Golden and felt stable and ready for discharge. The patient and son were both agreeable to this decision.  She is scheduled for an outpatient stress test and will follow up with Dr. Alanda Amass in the office.  Please note she also had positive urinalysis for infection she had no symptoms but has had long history of urinary tract infections she was placed on Bactrim for 3 days.   Consults: None Significant Diagnostic Studies: Labs at discharge sodium 141 potassium 3.5 chloride 105 CO2 27 BUN 14 creatinine 0.7 calcium 9 magnesium 2.2 glucose 93  Please note on admission potassium level was 3.0 and this was replaced  Cardiac enzymes were negative troponin<0.30 x3  CK 95-105  MB 2.6-3.4  Total cholesterol 141 triglycerides 58 HDL 62 LDL 67  Hemoglobin 62.1 hematocrit 37.9 WBC 6.6 platelets 165  TSH 2.27  UA was positive for infection with nitrites and small amount leukocytes she was placed on antibiotics for 3 days  EKGs  AV pacing  Discharge Exam: Blood pressure 184/96, pulse 88, temperature 97.9 F (36.6 C), temperature source Oral, resp. rate 18, height 5\' 2"  (1.575 m), weight 66.3 kg (146 lb 2.6 oz), SpO2 96.00%.   General:alert and oriented, no further pain since admit.  Heart:S1S2 RRR  Lungs:clear  Abd:+ BS soft, non  tender  Ext:no edema  Neuro:alert and oriented, MAE     Disposition: 01-Home or Self Care   Medication List  As of 08/03/2011  4:08 PM   TAKE these medications         amiodarone 200 MG tablet   Commonly known as: PACERONE   Take 200 mg by mouth daily.      aspirin 81 MG EC tablet   Take 81 mg by mouth daily.      calcium carbonate 500 MG chewable tablet   Commonly known as: TUMS - dosed in mg elemental calcium   Chew 1 tablet by mouth daily.      clopidogrel 75 MG tablet   Commonly known as: PLAVIX   Take 75 mg by mouth daily.      esomeprazole 40 MG capsule   Commonly known as: NEXIUM   Take 40 mg by mouth 2 (two) times daily.      furosemide 40 MG tablet   Commonly known as: LASIX   Take 1 tablet (40 mg total) by mouth 2 (two) times daily.      levothyroxine 75 MCG tablet   Commonly known as: SYNTHROID, LEVOTHROID   Take 75 mcg by mouth daily.      loratadine 10 MG tablet   Commonly known as: CLARITIN   Take 10 mg by mouth daily.      losartan 50 MG tablet   Commonly known as: COZAAR   Take 50 mg by mouth daily.      metoprolol 50 MG tablet   Commonly known as: LOPRESSOR   Take 75 mg by mouth 2 (two) times daily. Takes 1.5 tablet twice daily      multivitamin capsule   Take 1 capsule by mouth daily.      nitroGLYCERIN 0.4 MG SL tablet   Commonly known as: NITROSTAT   Place 0.4 mg under the tongue every 5 (five) minutes as needed.      potassium chloride SA 20 MEQ tablet   Commonly known as: K-DUR,KLOR-CON   Take 20 mEq by mouth daily.      PreserVision/Lutein Caps   Take by mouth 2 (two) times daily.      simvastatin 20 MG tablet   Commonly known as: ZOCOR   Take 20 mg by mouth every evening.      sulfamethoxazole-trimethoprim 400-80 MG per tablet   Commonly known as: BACTRIM,SEPTRA   Take 1 tablet by mouth every 12 (twelve) hours.      traMADol 50 MG tablet   Commonly known as: ULTRAM   Take 50 mg by mouth at bedtime.            Follow-up Information    Follow up with Governor Rooks, MD on 08/09/2011. (for Stress test.  at  7:25 am,  nothing to eat or drink after midnight  the night before the test and no caffine the day before the test)    Contact information:   [address]  [place] [address] [phone]       Follow up with Governor Rooks, MD on 08/13/2011. (at 2:30 pm  )  Contact information:   [address]  [place] [address] [phone]        Discharge instructions: Heart healthy diet  Call if recurrent chest pain Signed: Boots Mcglown R 08/03/2011, 4:08 PM

## 2011-08-09 HISTORY — PX: CARDIOVASCULAR STRESS TEST: SHX262

## 2011-08-10 NOTE — Progress Notes (Signed)
Utilization review completed.  

## 2011-08-30 ENCOUNTER — Other Ambulatory Visit: Payer: Self-pay | Admitting: Family Medicine

## 2011-11-07 ENCOUNTER — Emergency Department (HOSPITAL_COMMUNITY): Payer: Medicare Other

## 2011-11-07 ENCOUNTER — Inpatient Hospital Stay (HOSPITAL_COMMUNITY)
Admission: EM | Admit: 2011-11-07 | Discharge: 2011-11-08 | DRG: 305 | Disposition: A | Discharge status: Home / Self Care | Payer: Medicare Other | Attending: Cardiovascular Disease | Admitting: Cardiovascular Disease

## 2011-11-07 ENCOUNTER — Inpatient Hospital Stay (HOSPITAL_COMMUNITY): Payer: Medicare Other

## 2011-11-07 ENCOUNTER — Encounter (HOSPITAL_COMMUNITY): Payer: Self-pay | Admitting: Emergency Medicine

## 2011-11-07 DIAGNOSIS — I480 Paroxysmal atrial fibrillation: Secondary | ICD-10-CM | POA: Diagnosis present

## 2011-11-07 DIAGNOSIS — I2510 Atherosclerotic heart disease of native coronary artery without angina pectoris: Secondary | ICD-10-CM | POA: Diagnosis present

## 2011-11-07 DIAGNOSIS — R079 Chest pain, unspecified: Secondary | ICD-10-CM | POA: Diagnosis present

## 2011-11-07 DIAGNOSIS — Z883 Allergy status to other anti-infective agents status: Secondary | ICD-10-CM

## 2011-11-07 DIAGNOSIS — I4891 Unspecified atrial fibrillation: Secondary | ICD-10-CM | POA: Diagnosis present

## 2011-11-07 DIAGNOSIS — R42 Dizziness and giddiness: Secondary | ICD-10-CM

## 2011-11-07 DIAGNOSIS — Z7982 Long term (current) use of aspirin: Secondary | ICD-10-CM

## 2011-11-07 DIAGNOSIS — K219 Gastro-esophageal reflux disease without esophagitis: Secondary | ICD-10-CM | POA: Diagnosis present

## 2011-11-07 DIAGNOSIS — R8290 Unspecified abnormal findings in urine: Secondary | ICD-10-CM

## 2011-11-07 DIAGNOSIS — Z79899 Other long term (current) drug therapy: Secondary | ICD-10-CM

## 2011-11-07 DIAGNOSIS — Z880 Allergy status to penicillin: Secondary | ICD-10-CM

## 2011-11-07 DIAGNOSIS — Z9861 Coronary angioplasty status: Secondary | ICD-10-CM

## 2011-11-07 DIAGNOSIS — I509 Heart failure, unspecified: Secondary | ICD-10-CM | POA: Diagnosis present

## 2011-11-07 DIAGNOSIS — E039 Hypothyroidism, unspecified: Secondary | ICD-10-CM | POA: Diagnosis present

## 2011-11-07 DIAGNOSIS — I5030 Unspecified diastolic (congestive) heart failure: Secondary | ICD-10-CM | POA: Diagnosis present

## 2011-11-07 DIAGNOSIS — Z90710 Acquired absence of both cervix and uterus: Secondary | ICD-10-CM

## 2011-11-07 DIAGNOSIS — Z950 Presence of cardiac pacemaker: Secondary | ICD-10-CM

## 2011-11-07 DIAGNOSIS — I10 Essential (primary) hypertension: Secondary | ICD-10-CM

## 2011-11-07 DIAGNOSIS — Z886 Allergy status to analgesic agent status: Secondary | ICD-10-CM

## 2011-11-07 DIAGNOSIS — Z23 Encounter for immunization: Secondary | ICD-10-CM

## 2011-11-07 HISTORY — DX: Dizziness and giddiness: R42

## 2011-11-07 LAB — CARDIAC PANEL(CRET KIN+CKTOT+MB+TROPI)
Relative Index: 2.6 — ABNORMAL HIGH (ref 0.0–2.5)
Relative Index: INVALID (ref 0.0–2.5)
Total CK: 103 U/L (ref 7–177)
Total CK: 86 U/L (ref 7–177)
Troponin I: <0.3 ng/mL (ref <0.30)

## 2011-11-07 LAB — CBC
MCH: 29.3 pg (ref 26.0–34.0)
MCHC: 32.2 g/dL (ref 30.0–36.0)
Platelets: 190 10*3/uL (ref 150–400)

## 2011-11-07 LAB — MRSA PCR SCREENING: MRSA by PCR: NEGATIVE

## 2011-11-07 LAB — PROTIME-INR: Prothrombin Time: 12.6 seconds (ref 11.6–15.2)

## 2011-11-07 LAB — BASIC METABOLIC PANEL
Calcium: 9.3 mg/dL (ref 8.4–10.5)
GFR calc non Af Amer: 63 mL/min — ABNORMAL LOW (ref >90)
Glucose, Bld: 91 mg/dL (ref 70–99)
Sodium: 139 mEq/L (ref 135–145)

## 2011-11-07 LAB — PRO B NATRIURETIC PEPTIDE: Pro B Natriuretic peptide (BNP): 384.4 pg/mL (ref 0–450)

## 2011-11-07 MED ORDER — POTASSIUM CHLORIDE CRYS ER 20 MEQ PO TBCR
20.0000 meq | EXTENDED_RELEASE_TABLET | Freq: Every day | ORAL | Status: DC
  Administered 2011-11-08: 20 meq via ORAL
  Filled 2011-11-07: qty 1

## 2011-11-07 MED ORDER — METOPROLOL TARTRATE 50 MG PO TABS
75.0000 mg | ORAL_TABLET | Freq: Two times a day (BID) | ORAL | Status: DC
  Filled 2011-11-07: qty 1

## 2011-11-07 MED ORDER — DIAZEPAM 2 MG PO TABS: 1.0000 mg | ORAL_TABLET | Freq: Three times a day (TID) | ORAL | Status: DC | PRN

## 2011-11-07 MED ORDER — HYDRALAZINE HCL 20 MG/ML IJ SOLN
INTRAMUSCULAR | Status: AC
  Filled 2011-11-07: qty 1

## 2011-11-07 MED ORDER — AMIODARONE HCL 200 MG PO TABS
200.0000 mg | ORAL_TABLET | Freq: Every day | ORAL | Status: DC
  Administered 2011-11-08: 200 mg via ORAL
  Filled 2011-11-07: qty 1

## 2011-11-07 MED ORDER — TRAMADOL HCL 50 MG PO TABS
25.0000 mg | ORAL_TABLET | Freq: Every day | ORAL | Status: DC
  Administered 2011-11-08: 25 mg via ORAL
  Filled 2011-11-07 (×2): qty 1

## 2011-11-07 MED ORDER — PANTOPRAZOLE SODIUM 40 MG PO TBEC
80.0000 mg | DELAYED_RELEASE_TABLET | Freq: Every day | ORAL | Status: DC
  Administered 2011-11-08: 80 mg via ORAL
  Filled 2011-11-07: qty 2

## 2011-11-07 MED ORDER — HEPARIN (PORCINE) IN NACL 100-0.45 UNIT/ML-% IJ SOLN
950.0000 [IU]/h | INTRAMUSCULAR | Status: DC
  Administered 2011-11-07: 900 [IU]/h via INTRAVENOUS
  Filled 2011-11-07 (×2): qty 250

## 2011-11-07 MED ORDER — LORATADINE 10 MG PO TABS
10.0000 mg | ORAL_TABLET | Freq: Every day | ORAL | Status: DC
  Administered 2011-11-08: 10 mg via ORAL
  Filled 2011-11-07: qty 1

## 2011-11-07 MED ORDER — ASPIRIN EC 81 MG PO TBEC: 81.0000 mg | DELAYED_RELEASE_TABLET | Freq: Every day | ORAL | Status: DC

## 2011-11-07 MED ORDER — ONDANSETRON HCL 4 MG/2ML IJ SOLN: 4.0000 mg | Freq: Four times a day (QID) | INTRAMUSCULAR | Status: DC | PRN

## 2011-11-07 MED ORDER — HYDRALAZINE HCL 20 MG/ML IJ SOLN
10.0000 mg | Freq: Once | INTRAMUSCULAR | Status: AC
  Administered 2011-11-07: 10 mg via INTRAVENOUS

## 2011-11-07 MED ORDER — FUROSEMIDE 40 MG PO TABS
40.0000 mg | ORAL_TABLET | Freq: Every day | ORAL | Status: DC
  Administered 2011-11-08: 40 mg via ORAL
  Filled 2011-11-07 (×2): qty 1

## 2011-11-07 MED ORDER — ONDANSETRON 4 MG PO TBDP
4.0000 mg | ORAL_TABLET | Freq: Once | ORAL | Status: AC
  Administered 2011-11-07: 4 mg via ORAL
  Filled 2011-11-07: qty 1

## 2011-11-07 MED ORDER — SIMVASTATIN 20 MG PO TABS
20.0000 mg | ORAL_TABLET | Freq: Every day | ORAL | Status: DC
  Administered 2011-11-07: 20 mg via ORAL
  Filled 2011-11-07 (×2): qty 1

## 2011-11-07 MED ORDER — IOHEXOL 350 MG/ML SOLN
100.0000 mL | Freq: Once | INTRAVENOUS | Status: AC | PRN
  Administered 2011-11-07: 100 mL via INTRAVENOUS

## 2011-11-07 MED ORDER — SODIUM CHLORIDE 0.9 % IJ SOLN
3.0000 mL | Freq: Two times a day (BID) | INTRAMUSCULAR | Status: DC
  Administered 2011-11-07: 3 mL via INTRAVENOUS

## 2011-11-07 MED ORDER — SODIUM CHLORIDE 0.9 % IV SOLN
250.0000 mL | INTRAVENOUS | Status: DC | PRN
  Administered 2011-11-07: 250 mL via INTRAVENOUS

## 2011-11-07 MED ORDER — LABETALOL HCL 5 MG/ML IV SOLN
INTRAVENOUS | Status: AC
  Filled 2011-11-07: qty 4

## 2011-11-07 MED ORDER — HEPARIN BOLUS VIA INFUSION
3000.0000 [IU] | Freq: Once | INTRAVENOUS | Status: AC
  Administered 2011-11-07: 3000 [IU] via INTRAVENOUS
  Filled 2011-11-07: qty 3000

## 2011-11-07 MED ORDER — LEVOTHYROXINE SODIUM 75 MCG PO TABS
75.0000 ug | ORAL_TABLET | Freq: Every day | ORAL | Status: DC
  Administered 2011-11-08: 75 ug via ORAL
  Filled 2011-11-07 (×2): qty 1

## 2011-11-07 MED ORDER — ZOLPIDEM TARTRATE 5 MG PO TABS: 5.0000 mg | ORAL_TABLET | Freq: Every evening | ORAL | Status: DC | PRN

## 2011-11-07 MED ORDER — TRAMADOL HCL 50 MG PO TABS: 50.0000 mg | ORAL_TABLET | ORAL | Status: DC

## 2011-11-07 MED ORDER — LOSARTAN POTASSIUM 50 MG PO TABS
50.0000 mg | ORAL_TABLET | Freq: Every day | ORAL | Status: DC
  Filled 2011-11-07: qty 1

## 2011-11-07 MED ORDER — LABETALOL HCL 5 MG/ML IV SOLN
20.0000 mg | Freq: Once | INTRAVENOUS | Status: AC
  Administered 2011-11-07: 20 mg via INTRAVENOUS

## 2011-11-07 MED ORDER — CLOPIDOGREL BISULFATE 75 MG PO TABS
75.0000 mg | ORAL_TABLET | Freq: Every day | ORAL | Status: DC
  Administered 2011-11-08: 75 mg via ORAL
  Filled 2011-11-07 (×2): qty 1

## 2011-11-07 MED ORDER — METOPROLOL TARTRATE 100 MG PO TABS
100.0000 mg | ORAL_TABLET | Freq: Two times a day (BID) | ORAL | Status: DC
  Administered 2011-11-08 (×2): 100 mg via ORAL
  Filled 2011-11-07 (×4): qty 1

## 2011-11-07 MED ORDER — TRAMADOL HCL 50 MG PO TABS: 25.0000 mg | ORAL_TABLET | ORAL | Status: DC

## 2011-11-07 MED ORDER — ASPIRIN EC 81 MG PO TBEC
81.0000 mg | DELAYED_RELEASE_TABLET | Freq: Every day | ORAL | Status: DC
  Administered 2011-11-08: 81 mg via ORAL
  Filled 2011-11-07: qty 1

## 2011-11-07 MED ORDER — SODIUM CHLORIDE 0.9 % IJ SOLN: 3.0000 mL | INTRAMUSCULAR | Status: DC | PRN

## 2011-11-07 MED ORDER — ACETAMINOPHEN 325 MG PO TABS: 650.0000 mg | ORAL_TABLET | ORAL | Status: DC | PRN

## 2011-11-07 NOTE — ED Provider Notes (Signed)
History     CSN: [id number]  Arrival date & time 11/07/11  7829   First MD Initiated Contact with Patient 11/07/11 1022      Chief Complaint  Patient presents with  . Chest Pain    HPI 78 yo female with CAD s/p multiple LAD re-stenting, HTN, diastolic CHF who presents with chest pain that started at 7am today. Chest pain is sharp, midsternal, radiating to both arms and back. Lasted 30-40 minutes. Not improved with rest. Associated with nausea, no vomiting and some shortness of breath. No diaphoresis. Took 2x 81mg  aspirin. Did not take any nitro due to previous episode of hypotension after SL nitro.  BP at home was in the 190's systolic. Her son reports that her BP's normally range in the 130's. Reports compliance with medications. Has not had any chest pain since her last admission in May.  Was admitted to the hospital in May 2013 for chest pain with unremarkable workup. Per her son, an outpatient stress test was done which was reportedly normal.    Denies any acid reflux or any trauma to the chest.  Past Medical History  Diagnosis Date  . Hypertension   . CHF (congestive heart failure)   . A-fib   . Hypothyroid   . GERD (gastroesophageal reflux disease)   . Hypothyroidism   . Bradycardia   . Pancreatitis   . Coronary artery disease   . Shingles   . CORONARY ARTERY DISEASE 11/28/2006    Qualifier: Diagnosis of  By: Tawanna Cooler MD, Eugenio Hoes   . Pacemaker   . Paroxysmal a-fib, history of 08/02/2011  . Tremor, essential 08/02/2011  . Esophageal disorder, with history of dilatation 08/02/2011  . Angina   . H/O hiatal hernia   . Neuromuscular disorder     essential tremors  . UTI (lower urinary tract infection) 08/03/2011    Past Surgical History  Procedure Date  . Permanent pacemaker   . S/p total hyserectomy and left oophorectomy   . S/p cholecystectomy   . Repair spigelian hernia   . S/p childbirth     x2  . S/p cataract extraction with lens implant   . S/p appendectomy   . Splint  2008  . Coronary angioplasty   . Insert / replace / remove pacemaker   . Cholecystectomy   . Appendectomy     History reviewed. No pertinent family history.  History  Substance Use Topics  . Smoking status: Never Smoker   . Smokeless tobacco: Never Used  . Alcohol Use: No    OB History    Grav Para Term Preterm Abortions TAB SAB Ect Mult Living                  Review of Systems  All other systems reviewed and are negative.    Allergies  Codeine; Isosorbide mononitrate; Lorazepam; Prednisone; and Penicillins  Home Medications   Current Outpatient Rx  Name Route Sig Dispense Refill  . AMIODARONE HCL 200 MG PO TABS Oral Take 200 mg by mouth daily.      . ASPIRIN 81 MG PO TBEC Oral Take 81 mg by mouth daily.      [name] CALCIUM CARBONATE ANTACID 500 MG PO CHEW Oral Chew 1 tablet by mouth daily.     [name] CLOPIDOGREL BISULFATE 75 MG PO TABS Oral Take 75 mg by mouth daily.      [name] ESOMEPRAZOLE MAGNESIUM 40 MG PO CPDR Oral Take 40 mg by mouth 2 (two) times daily.     [name]  FUROSEMIDE 40 MG PO TABS Oral Take 1 tablet (40 mg total) by mouth 2 (two) times daily. 30 tablet 0  . LEVOTHYROXINE SODIUM 75 MCG PO TABS Oral Take 75 mcg by mouth daily.    [name] LEVOTHYROXINE SODIUM 75 MCG PO TABS  TAKE ONE TABLET BY MOUTH DAILY 90 tablet 1  . LORATADINE 10 MG PO TABS Oral Take 10 mg by mouth daily.      [name] LOSARTAN POTASSIUM 50 MG PO TABS Oral Take 50 mg by mouth daily.      [name] METOPROLOL TARTRATE 50 MG PO TABS Oral Take 75 mg by mouth 2 (two) times daily. Takes 1.5 tablet twice daily    . MULTIVITAMINS PO CAPS Oral Take 1 capsule by mouth daily.      [name] PRESERVISION/LUTEIN PO CAPS Oral Take by mouth 2 (two) times daily.      [name] NITROGLYCERIN 0.4 MG SL SUBL Sublingual Place 0.4 mg under the tongue every 5 (five) minutes as needed.      [name] POTASSIUM CHLORIDE CRYS ER 20 MEQ PO TBCR Oral Take 20 mEq by mouth daily.     [name] SIMVASTATIN 20 MG PO TABS Oral Take 20 mg by mouth every evening.    [name] SIMVASTATIN 20 MG  PO TABS  TAKE ONE TABLET BY MOUTH DAILY 90 tablet 1  . TRAMADOL HCL 50 MG PO TABS Oral Take 50 mg by mouth at bedtime.      BP 156/75  Pulse 70  Temp 98.3 F (36.8 C) (Oral)  Resp 15  SpO2 97%  Physical Exam  Constitutional: She is oriented to person, place, and time.       Anxious appearing. No acute distress  HENT:  Head: Normocephalic.  Eyes: EOM are normal. Pupils are equal, round, and reactive to light.  Cardiovascular: Normal rate, regular rhythm and normal heart sounds.   No murmur heard. Pulmonary/Chest: Effort normal and breath sounds normal. No respiratory distress.  Abdominal: Soft. She exhibits no distension. There is no tenderness.  Neurological: She is alert and oriented to person, place, and time.  Skin: Skin is warm and dry.    ED Course  Procedures (including critical care time) BMET    Component Value Date/Time   NA 139 11/07/2011 0956   K 4.5 11/07/2011 0956   CL 101 11/07/2011 0956   CO2 27 11/07/2011 0956   GLUCOSE 91 11/07/2011 0956   BUN 19 11/07/2011 0956   CREATININE 0.81 11/07/2011 0956   CALCIUM 9.3 11/07/2011 0956   GFRNONAA 63* 11/07/2011 0956   GFRAA 72* 11/07/2011 0956   CBC    Component Value Date/Time   WBC 8.3 11/07/2011 0956   RBC 4.30 11/07/2011 0956   HGB 12.6 11/07/2011 0956   HCT 39.1 11/07/2011 0956   PLT 190 11/07/2011 0956   MCV 90.9 11/07/2011 0956   MCH 29.3 11/07/2011 0956   MCHC 32.2 11/07/2011 0956   RDW 15.3 11/07/2011 0956   LYMPHSABS 1.1 12/21/2010 1658   MONOABS 0.6 12/21/2010 1658   EOSABS 0.1 12/21/2010 1658   BASOSABS 0.0 12/21/2010 1658   Troponin (Point of Care Test)  Basename 11/07/11 1022  TROPIPOC 0.00    Date: 11/07/2011  Rate: 82  Rhythm: normal sinus rythm  QRS Axis: normal  Intervals: prolonged PR  ST/T Wave abnormalities: normal  Conduction Disutrbances: none  Narrative Interpretation:   Old EKG Reviewed: May 3rd 2013: No significant changes noted, AV paced   Labs Reviewed  CBC  PROTIME-INR  POCT I-STAT TROPONIN I    BASIC METABOLIC PANEL  PRO B NATRIURETIC PEPTIDE   Dg Chest 2 View  11/07/2011  *RADIOLOGY REPORT*  Clinical Data: Chest pain, cough.  CHEST - 2 VIEW  Comparison: 08/02/2011  Findings: Cardiomegaly.  Mild hyperinflation of the lungs.  Right pacer remains in place, unchanged.  No effusions.  No acute bony abnormality.  IMPRESSION: Cardiomegaly, hyperinflation.  No acute findings.  Original Report Authenticated By: Cyndie Chime, M.D.    Diagnosis: Chest pain   MDM  10:55am: patient seen and evaluated. Denies any chest pain at the time 11:00am: spoke with Nada Boozer with [facility] cardiology who will come to evaluate her.  12:30PM: patient admitted to cardiology service for ACS rule out.   Marena Chancy, PGY-2 [facility]        Lonia Skinner, MD 11/07/11 [phone]

## 2011-11-07 NOTE — Progress Notes (Addendum)
At 14:16 BP=179/80  20 iv labetalol given  At 1432 BP= 171/80  AT 1710:  PATIENT  COMPLAINED OF INCREASE IN TREMORS OF RIGHT UPPER EXTREMITY AND BP= 201/99  10 IV HYDRALAZINE GIVEN, ORDER RECEIVED TO MOVE TO STEPDOWN.  WILL CONTINUE TO MONITOR

## 2011-11-07 NOTE — H&P (Signed)
[name] Luna is an 78 y.o. female.    Cardiologist: Dr. Alanda Amass   Chief Complaint: chest pain that woke her from sleep HPI: 78 year old W,W female that lives with her son, was awakened from sleep with midsternal chest pain that radiated to both arms and to her back.  Associated symptoms were SOB, Nausea, no diaphoresis.  She sat up for awhile -her son checked her BP and it was 190/70, they came to ER. She was given IV morphine with resolution of symptoms.  Currently pain free and EKG SR with atrial pacing and long AV conduction.   Prior to today she has increased weakness and several episodes of dizziness, at times feeling she may pass out.  Also weakness of Rt. Leg, almost dragging rt. Foot.    Hx of CAD With prior PCI with cutting balloon atherectomy and DES stenting of the LAD in 2008. In October 2011 she had 75% narrowing in her LAD stent and was restented with another drug-eluting Cypher stent.  In June 2012 she was readmitted with chest pain recathed at that time which was revealing 70% in-stent restenosis in the LAD stent which was treated with a sandwich 3.8 DES resolute stent.  September 2012 she was readmitted for chest pain cardiac cath at that time revealed patent stents.  In May of this year she was admitted with chest pain negative EKG negative enzymes and she was set up for an outpatient DEXA scan Myoview which was negative for ischemia EF was normal and was felt to be a low risk study.  Other history does include chronic hoarseness and speech impediment which is stable chest GI issues with a history of esophageal spasm and/or achalasia and a dilatation of her esophagus at one point.  She has a history of paroxysmal atrial fibrillation and at one point was on Coumadin aspirin and Plavix but the Coumadin has been DC'd.  She also has a permanent pacemaker a Medtronic versa implanted in 2009 which was end-of-life change her initial pacemaker was 2000 implanted for syncope and heart block  and ventricular asystole.    Past Medical History  Diagnosis Date  . Hypertension   . CHF (congestive heart failure)   . A-fib   . Hypothyroid   . GERD (gastroesophageal reflux disease)   . Hypothyroidism   . Bradycardia   . Pancreatitis   . Coronary artery disease   . Shingles   . CORONARY ARTERY DISEASE 11/28/2006    Qualifier: Diagnosis of  By: Tawanna Cooler MD, Eugenio Hoes   . Pacemaker   . Paroxysmal a-fib, history of 08/02/2011  . Tremor, essential 08/02/2011  . Esophageal disorder, with history of dilatation 08/02/2011  . Angina   . H/O hiatal hernia   . Neuromuscular disorder     essential tremors  . UTI (lower urinary tract infection) 08/03/2011  . H/O cardiac pacemaker, Medtronic versa implanted 01/2008, though original implanted 2000 for syncope and heart block and ventricular asystole. 08/02/2011  . Dizziness, near syncope 11/07/2011    Past Surgical History  Procedure Date  . Permanent pacemaker   . S/p total hyserectomy and left oophorectomy   . S/p cholecystectomy   . Repair spigelian hernia   . S/p childbirth     x2  . S/p cataract extraction with lens implant   . S/p appendectomy   . Splint 2008  . Coronary angioplasty   . Insert / replace / remove pacemaker   . Cholecystectomy   . Appendectomy  History reviewed. No pertinent family history.Not significant to this admission Social History:  reports that she has never smoked. She has never used smokeless tobacco. She reports that she does not drink alcohol or use illicit drugs.She is widowed and lives with her son. Able to do activities of daily living.  Allergies:  Allergies  Allergen Reactions  . Codeine Other (See Comments)    Passes out  . Isosorbide Mononitrate     REACTION: fainted  . Lorazepam Other (See Comments)    hallucinations  . Prednisone Other (See Comments)    nausea  . Penicillins Rash   Outpatient medications: Cozaar 50 mg daily Atenolol tartrate 75 mg twice a day Amiodarone 200 mg  daily Aspirin 81 mg daily Calcium 250 mg daily Claritin 10 mg daily  levothyroxine 75 mcg daily Nexium 40 mg twice a day Nitroglycerin sublingual 0.4 as needed  Multivitamin 1 daily Ocuvite twice a day Plavix 75 mg daily Potassium 20 mEq daily Simvastatin 20 mg every evening next 1 Lasix 40 mg once a day decreased in May Tramadol 25 mg every HS  Results for orders placed during the hospital encounter of 11/07/11 (from the past 48 hour(s))  CBC     Status: Normal   Collection Time   11/07/11  9:56 AM      Component Value Range Comment   WBC 8.3  4.0 - 10.5 K/uL    RBC 4.30  3.87 - 5.11 MIL/uL    Hemoglobin 12.6  12.0 - 15.0 g/dL    HCT 16.1  09.6 - 04.5 %    MCV 90.9  78.0 - 100.0 fL    MCH 29.3  26.0 - 34.0 pg    MCHC 32.2  30.0 - 36.0 g/dL    RDW 40.9  81.1 - 91.4 %    Platelets 190  150 - 400 K/uL   BASIC METABOLIC PANEL     Status: Abnormal   Collection Time   11/07/11  9:56 AM      Component Value Range Comment   Sodium 139  135 - 145 mEq/L    Potassium 4.5  3.5 - 5.1 mEq/L    Chloride 101  96 - 112 mEq/L    CO2 27  19 - 32 mEq/L    Glucose, Bld 91  70 - 99 mg/dL    BUN 19  6 - 23 mg/dL    Creatinine, Ser 7.82  0.50 - 1.10 mg/dL    Calcium 9.3  8.4 - 95.6 mg/dL    GFR calc non Af Amer 63 (*) >90 mL/min    GFR calc Af Amer 72 (*) >90 mL/min   PRO B NATRIURETIC PEPTIDE     Status: Normal   Collection Time   11/07/11  9:56 AM      Component Value Range Comment   Pro B Natriuretic peptide (BNP) 384.4  0 - 450 pg/mL   PROTIME-INR     Status: Normal   Collection Time   11/07/11  9:56 AM      Component Value Range Comment   Prothrombin Time 12.6  11.6 - 15.2 seconds    INR 0.92  0.00 - 1.49   POCT I-STAT TROPONIN I     Status: Normal   Collection Time   11/07/11 10:22 AM      Component Value Range Comment   Troponin i, poc 0.00  0.00 - 0.08 ng/mL    Comment 3  Dg Chest 2 View  11/07/2011  *RADIOLOGY REPORT*  Clinical Data: Chest pain, cough.  CHEST - 2 VIEW   Comparison: 08/02/2011  Findings: Cardiomegaly.  Mild hyperinflation of the lungs.  Right pacer remains in place, unchanged.  No effusions.  No acute bony abnormality.  IMPRESSION: Cardiomegaly, hyperinflation.  No acute findings.  Original Report Authenticated By: Cyndie Chime, M.D.    ROS: General:No recent colds or fevers but has been increased fatigue for the last 2 days and have the last 5 days has episodes of dizziness and feeling as if she may pass out Skin:The rashes or ulcers HEENT:The blurred vision double vision she continues to wear glasses ZO:XWRUE pain as described see history of present illness AVW:[id number] of breath with her chest pain to GI:No diarrhea constipation or melena GU:No hematuria or dysuria WG:NFAOZ leg weakness feels as if she has to drag her right leg Neuro:She has essential tremor of both arms, feeling as if she may pass out at times and dizziness increased fatigue Endo: Hypothyroidism treated we'll check TSH   Blood pressure 147/68, pulse 70, temperature 98.3 F (36.8 C), temperature source Oral, resp. rate 13, SpO2 97.00%. PE: General:Alert oriented white female no acute distress currently pleasant affect Skin:Cool and clammy HEENT:Normocephalic sclera clear Neck:Supple no JVD no carotid bruits Heart:S1-S2 regular rate and rhythm soft systolic murmur Lungs:Clear without rales rhonchi or wheezes HYQ:MVHQ nontender positive bowel sounds don't palpate liver spleen or masses Ext:No edema and 2+ pedal bilaterally 2+ radials bilaterally Neuro:Alert oriented x3 follows commands and moves all extremities,  Push pull of right and left leg are equal and pulses are equal, patient can raise both legs without pain.  Essential tremor of both hands    Assessment/Plan Principal Problem:  *Chest pain at rest Active Problems:  CORONARY ARTERY DISEASE  H/O cardiac pacemaker, Medtronic versa implanted 01/2008, though original implanted 2000 for syncope and heart block  and ventricular asystole.  Paroxysmal a-fib, history of, off coumadin since 10/2010, without recurrence of afib.  HYPOTHYROIDISM  Dizziness, near syncope  PLAN: Admit to rule out MI, CT angiogram of the chest rule out aortic dissection. We'll add IV heparin if CT negative.  We'll interrogate pacemaker to rule out paroxysmal A. Fib causing episodes of dizziness and near syncope at home.  If cardiac enzymes are negative adjust medication and GI consult either as inpatient or outpatient to further evaluate for esophageal spasm. PT eval if negative MI, for Rt. Leg weakness.   [name] Luna,[name] Luna 11/07/2011, 12:40 PM   Patient seen and examined. Agree with assessment and plan. 78 yo WF with established CAD with several PCI's to LAD. Last cath in September 2012 showed stent patency. A recent myoview scan in 08/2011 was without ischemia; post stress EF was hyperdynamic EF 80%. She now presents hypertensive with chest pain this am leading to evaluation. ECG is A paced, without STT abnormalities. Will obtain serial enzymes, interrogate pacemaker with presyncope, chest CT angio. She does have GI history, consider GI eval if above studies are negative.   Lennette Bihari, MD, [facility] 11/07/2011 12:46 PM

## 2011-11-07 NOTE — ED Notes (Signed)
Pt c/o midsternal CP with SOB and nausea that started today; pt sts hx of stents in past

## 2011-11-07 NOTE — Progress Notes (Deleted)
ANTICOAGULATION CONSULT NOTE - Initial Consult  Pharmacy Consult for Heparin Indication: chest pain/ACS  Allergies  Allergen Reactions  . Codeine Other (See Comments)    Passes out  . Isosorbide Mononitrate     REACTION: fainted  . Lorazepam Other (See Comments)    hallucinations  . Prednisone Other (See Comments)    nausea  . Penicillins Rash    Patient Measurements: Height: 5\' 2"  (157.5 cm) Weight: 146 lb (66.225 kg) IBW/kg (Calculated) : 50.1  Heparin Dosing Weight: 66 kg  Vital Signs: Temp: 98 F (36.7 C) (08/07 1416) Temp src: Oral (08/07 1416) BP: 179/80 mmHg (08/07 1416) Pulse Rate: 75  (08/07 1416)  Labs:  Basename 11/07/11 0956 11/07/11 0950  HGB 12.6 --  HCT 39.1 --  PLT 190 --  APTT -- --  LABPROT 12.6 --  INR 0.92 --  HEPARINUNFRC -- --  CREATININE 0.81 --  CKTOTAL -- 103  CKMB -- 2.7  TROPONINI -- <0.30    Estimated Creatinine Clearance: 42 ml/min (by C-G formula based on Cr of 0.81).   Medical History: Past Medical History  Diagnosis Date  . Hypertension   . CHF (congestive heart failure)   . A-fib   . Hypothyroid   . GERD (gastroesophageal reflux disease)   . Hypothyroidism   . Bradycardia   . Pancreatitis   . Coronary artery disease   . Shingles   . CORONARY ARTERY DISEASE 11/28/2006    Qualifier: Diagnosis of  By: Tawanna Cooler MD, Eugenio Hoes   . Pacemaker   . Paroxysmal a-fib, history of 08/02/2011  . Tremor, essential 08/02/2011  . Esophageal disorder, with history of dilatation 08/02/2011  . Angina   . H/O hiatal hernia   . Neuromuscular disorder     essential tremors  . UTI (lower urinary tract infection) 08/03/2011  . H/O cardiac pacemaker, Medtronic versa implanted 01/2008, though original implanted 2000 for syncope and heart block and ventricular asystole. 08/02/2011  . Dizziness, near syncope 11/07/2011    Medications:  Prescriptions prior to admission  Medication Sig Dispense Refill  . amiodarone (PACERONE) 200 MG tablet Take 200 mg  by mouth daily.        [name] aspirin 81 MG EC tablet Take 81 mg by mouth daily.        . calcium carbonate (TUMS - DOSED IN MG ELEMENTAL CALCIUM) 500 MG chewable tablet Chew 1 tablet by mouth daily.       . clopidogrel (PLAVIX) 75 MG tablet Take 75 mg by mouth daily.        [name] esomeprazole (NEXIUM) 40 MG capsule Take 40 mg by mouth 2 (two) times daily.       . furosemide (LASIX) 40 MG tablet Take 1 tablet (40 mg total) by mouth 2 (two) times daily.  30 tablet  0  . levothyroxine (SYNTHROID, LEVOTHROID) 75 MCG tablet Take 75 mcg by mouth daily.      [name] loratadine (CLARITIN) 10 MG tablet Take 10 mg by mouth daily.        [name] losartan (COZAAR) 50 MG tablet Take 50 mg by mouth daily.        . metoprolol (LOPRESSOR) 50 MG tablet Take 75 mg by mouth 2 (two) times daily. Takes 1.5 tablet twice daily      . Multiple Vitamin (MULTIVITAMIN) capsule Take 1 capsule by mouth daily.        . Multiple Vitamins-Minerals (PRESERVISION/LUTEIN) CAPS Take by mouth 2 (two) times daily.        [name]  potassium chloride SA (K-DUR,KLOR-CON) 20 MEQ tablet Take 20 mEq by mouth daily.       . simvastatin (ZOCOR) 20 MG tablet Take 20 mg by mouth every evening.      . traMADol (ULTRAM) 50 MG tablet Take 25 mg by mouth at bedtime.       . nitroGLYCERIN (NITROSTAT) 0.4 MG SL tablet Place 0.4 mg under the tongue every 5 (five) minutes as needed.          Assessment: CP, SOB, nausea, elevated BP  PMH: Chronic hoarseness, speech impediment, h/o esophageal spasm and/or achalasia and dilatation of esophagus, PAF (Coumadin d/c), PPM, HTN, CHF, hypothyroid, h/o HH, GERD, bradycardia, pancreatitis, CAD +stents, shingles, essential tremor.   Goal of Therapy:  Heparin level 0.3-0.7 units/ml Monitor platelets by anticoagulation protocol: Yes   Plan:  Heparin 4000 units IV bolus and infusion at 1000 units/hr Check heparin level in 6-8 hrs and daily.  Merilynn Finland, [employer] 11/07/2011,2:27 PM

## 2011-11-07 NOTE — ED Notes (Signed)
Patient reported slight nausea no emesis

## 2011-11-07 NOTE — ED Provider Notes (Signed)
I saw and evaluated the patient, reviewed the resident's note and I agree with the findings and plan. I personally evaluated the ECG and agree with the interpretation of the resident  Chest pain. Recent normal nuclear stress. [facility] to evaluate in ER  Lyanne Co, MD 11/07/11 2143

## 2011-11-07 NOTE — Progress Notes (Signed)
Pt's BP continued to be elevated, gave 20 mg IV labetalol, so far continued elevated BP.  She also complains of increase in rt. Arm chronic tremor.  Will check CT of head without contrast to r/o neuro cause.      Pt then complained of feeling strange all over, no chest pain, no SOB.  General:alert and oriented, pleasant affect  Skin:warm to cool and dry HEENT:pupils equal,+ facial symmetry with smile and frown tongue midline Heart:S1S2, RRR soft murmur Lungs:clear Abd:+ BS, soft, non tender Ext:no edema, push and pull of both feet equal and no change from earlier in the day, Neuro:follows commands.  MAE, termor of rt arm pronounced at time, occ more than left but tremor mostly equal and chronic.  EKG without acute changes   Pacer interrogated -no PAF, pacing approp.     ` Patient seen and examined. Agree with assessment and plan. No chest pain or sob. Mild headache, no visual changes. No hemiparesis, but mild tremor episodic right greater than left (this has been noted before). Agree with CT scan and increase meds for more optimal BP control.   Lennette Bihari, MD, [facility] 11/07/2011 5:30 PM

## 2011-11-07 NOTE — Progress Notes (Signed)
ANTICOAGULATION CONSULT NOTE - Initial Consult  Pharmacy Consult for heparin Indication: chest pain/ACS  Allergies  Allergen Reactions  . Codeine Other (See Comments)    Passes out  . Isosorbide Mononitrate     REACTION: fainted  . Lorazepam Other (See Comments)    hallucinations  . Prednisone Other (See Comments)    nausea  . Penicillins Rash    Patient Measurements: Height: 5\' 2"  (157.5 cm) Weight: 145 lb 15.1 oz (66.2 kg) IBW/kg (Calculated) : 50.1  Heparin Dosing Weight: 66.2kg  Vital Signs: Temp: 98.5 F (36.9 C) (08/07 2014) Temp src: Oral (08/07 2014) BP: 140/51 mmHg (08/07 2000) Pulse Rate: 72  (08/07 2000)  Labs:  Basename 11/07/11 1947 11/07/11 0956 11/07/11 0950  HGB -- 12.6 --  HCT -- 39.1 --  PLT -- 190 --  APTT -- -- --  LABPROT -- 12.6 --  INR -- 0.92 --  HEPARINUNFRC -- -- --  CREATININE -- 0.81 --  CKTOTAL 86 -- 103  CKMB 2.7 -- 2.7  TROPONINI <0.30 -- <0.30    Estimated Creatinine Clearance: 42 ml/min (by C-G formula based on Cr of 0.81).   Medical History: Past Medical History  Diagnosis Date  . Hypertension   . CHF (congestive heart failure)   . A-fib   . Hypothyroid   . GERD (gastroesophageal reflux disease)   . Hypothyroidism   . Bradycardia   . Pancreatitis   . Coronary artery disease   . Shingles   . CORONARY ARTERY DISEASE 11/28/2006    Qualifier: Diagnosis of  By: Tawanna Cooler MD, Eugenio Hoes   . Pacemaker   . Paroxysmal a-fib, history of 08/02/2011  . Tremor, essential 08/02/2011  . Esophageal disorder, with history of dilatation 08/02/2011  . Angina   . H/O hiatal hernia   . Neuromuscular disorder     essential tremors  . UTI (lower urinary tract infection) 08/03/2011  . H/O cardiac pacemaker, Medtronic versa implanted 01/2008, though original implanted 2000 for syncope and heart block and ventricular asystole. 08/02/2011  . Dizziness, near syncope 11/07/2011    Medications:  Prescriptions prior to admission  Medication Sig Dispense  Refill  . amiodarone (PACERONE) 200 MG tablet Take 200 mg by mouth daily.        [name] aspirin 81 MG EC tablet Take 81 mg by mouth daily.        . calcium carbonate (TUMS - DOSED IN MG ELEMENTAL CALCIUM) 500 MG chewable tablet Chew 1 tablet by mouth daily.       . clopidogrel (PLAVIX) 75 MG tablet Take 75 mg by mouth daily.        [name] esomeprazole (NEXIUM) 40 MG capsule Take 40 mg by mouth 2 (two) times daily.       . furosemide (LASIX) 40 MG tablet Take 1 tablet (40 mg total) by mouth 2 (two) times daily.  30 tablet  0  . levothyroxine (SYNTHROID, LEVOTHROID) 75 MCG tablet Take 75 mcg by mouth daily.      [name] loratadine (CLARITIN) 10 MG tablet Take 10 mg by mouth daily.        [name] losartan (COZAAR) 50 MG tablet Take 50 mg by mouth daily.        . metoprolol (LOPRESSOR) 50 MG tablet Take 75 mg by mouth 2 (two) times daily. Takes 1.5 tablet twice daily      . Multiple Vitamin (MULTIVITAMIN) capsule Take 1 capsule by mouth daily.        . Multiple  Vitamins-Minerals (PRESERVISION/LUTEIN) CAPS Take by mouth 2 (two) times daily.        . potassium chloride SA (K-DUR,KLOR-CON) 20 MEQ tablet Take 20 mEq by mouth daily.       . simvastatin (ZOCOR) 20 MG tablet Take 20 mg by mouth every evening.      . traMADol (ULTRAM) 50 MG tablet Take 25 mg by mouth at bedtime.       . nitroGLYCERIN (NITROSTAT) 0.4 MG SL tablet Place 0.4 mg under the tongue every 5 (five) minutes as needed.          Assessment: 78 yo lady admitted with CP to start heparin.  CT chest was negative for aortic dissection and CT head neg for bleed.  Last BP reported 140/51. Goal of Therapy:  Heparin level 0.3-0.7 units/ml Monitor platelets by anticoagulation protocol: Yes   Plan:  Heparin bolus 3000 units and drip at 900 units/hr Check heparin level and CBC 8 hours after start then daily while on heparin. F/u s&s bleeding.  Wassim Kirksey Poteet 11/07/2011,9:40 PM

## 2011-11-07 NOTE — ED Notes (Signed)
Admit Doctor at bedside.  

## 2011-11-08 ENCOUNTER — Encounter (HOSPITAL_COMMUNITY): Payer: Self-pay | Admitting: Cardiology

## 2011-11-08 DIAGNOSIS — I10 Essential (primary) hypertension: Secondary | ICD-10-CM

## 2011-11-08 DIAGNOSIS — R8290 Unspecified abnormal findings in urine: Secondary | ICD-10-CM

## 2011-11-08 LAB — CBC
Platelets: 173 10*3/uL (ref 150–400)
RDW: 15.5 % (ref 11.5–15.5)
WBC: 7.8 10*3/uL (ref 4.0–10.5)

## 2011-11-08 LAB — BASIC METABOLIC PANEL
CO2: 26 mEq/L (ref 19–32)
Calcium: 8.8 mg/dL (ref 8.4–10.5)
Creatinine, Ser: 0.65 mg/dL (ref 0.50–1.10)
Glucose, Bld: 96 mg/dL (ref 70–99)

## 2011-11-08 LAB — CARDIAC PANEL(CRET KIN+CKTOT+MB+TROPI)
CK, MB: 2.7 ng/mL (ref 0.3–4.0)
Total CK: 87 U/L (ref 7–177)
Troponin I: <0.3 ng/mL (ref <0.30)

## 2011-11-08 LAB — LIPID PANEL
Total CHOL/HDL Ratio: 2.5 RATIO
VLDL: 13 mg/dL (ref 0–40)

## 2011-11-08 LAB — URINALYSIS, ROUTINE W REFLEX MICROSCOPIC
Nitrite: NEGATIVE
Specific Gravity, Urine: 1.035 — ABNORMAL HIGH (ref 1.005–1.030)
Urobilinogen, UA: 1 mg/dL (ref 0.0–1.0)

## 2011-11-08 LAB — URINE MICROSCOPIC-ADD ON

## 2011-11-08 LAB — HEPARIN LEVEL (UNFRACTIONATED)
Heparin Unfractionated: 0.3 IU/mL (ref 0.30–0.70)
Heparin Unfractionated: 0.47 IU/mL (ref 0.30–0.70)

## 2011-11-08 LAB — TSH: TSH: 2.201 u[IU]/mL (ref 0.350–4.500)

## 2011-11-08 MED ORDER — LOSARTAN POTASSIUM 100 MG PO TABS: 100.0000 mg | ORAL_TABLET | Freq: Every day | ORAL | Status: DC

## 2011-11-08 MED ORDER — PNEUMOCOCCAL VAC POLYVALENT 25 MCG/0.5ML IJ INJ
0.5000 mL | INJECTION | INTRAMUSCULAR | Status: AC
  Administered 2011-11-08: 0.5 mL via INTRAMUSCULAR
  Filled 2011-11-08: qty 0.5

## 2011-11-08 MED ORDER — HYDRALAZINE HCL 20 MG/ML IJ SOLN: 10.0000 mg | Freq: Four times a day (QID) | INTRAMUSCULAR | Status: DC | PRN

## 2011-11-08 MED ORDER — LOSARTAN POTASSIUM 50 MG PO TABS
100.0000 mg | ORAL_TABLET | Freq: Every day | ORAL | Status: DC
  Administered 2011-11-08: 100 mg via ORAL
  Filled 2011-11-08: qty 2

## 2011-11-08 MED ORDER — METOPROLOL TARTRATE 100 MG PO TABS: 100.0000 mg | ORAL_TABLET | Freq: Two times a day (BID) | ORAL | Status: DC

## 2011-11-08 MED ORDER — FUROSEMIDE 40 MG PO TABS: 40.0000 mg | ORAL_TABLET | Freq: Every day | ORAL | Status: DC

## 2011-11-08 MED ORDER — SULFAMETHOXAZOLE-TRIMETHOPRIM 800-160 MG PO TABS: 1.0000 | ORAL_TABLET | Freq: Two times a day (BID) | ORAL | Status: AC

## 2011-11-08 MED ORDER — HYDRALAZINE HCL 20 MG/ML IJ SOLN
INTRAMUSCULAR | Status: AC
  Filled 2011-11-08: qty 1

## 2011-11-08 MED ORDER — CIPROFLOXACIN HCL 250 MG PO TABS
250.0000 mg | ORAL_TABLET | Freq: Two times a day (BID) | ORAL | Status: DC
  Administered 2011-11-08: 250 mg via ORAL
  Filled 2011-11-08 (×3): qty 1

## 2011-11-08 MED ORDER — HYDRALAZINE HCL 20 MG/ML IJ SOLN
10.0000 mg | Freq: Once | INTRAMUSCULAR | Status: AC
  Administered 2011-11-08: 10 mg via INTRAVENOUS

## 2011-11-08 MED ORDER — RANOLAZINE ER 500 MG PO TB12: 500.0000 mg | ORAL_TABLET | Freq: Two times a day (BID) | ORAL | Status: DC

## 2011-11-08 NOTE — Progress Notes (Signed)
Paged MD regarding patient's BP; manual was 170/68.  Informed NP also of patient's slight abdominal pain during the night as well as patient only voiding once.  Orders received; will continue to monitor.   Vivi Martens RN

## 2011-11-08 NOTE — Progress Notes (Signed)
ANTICOAGULATION CONSULT NOTE - Follow Up Consult  Pharmacy Consult for heparin Indication: chest pain/ACS  Allergies  Allergen Reactions  . Codeine Other (See Comments)    Passes out  . Isosorbide Mononitrate     REACTION: fainted  . Lorazepam Other (See Comments)    hallucinations  . Prednisone Other (See Comments)    nausea  . Valium (Diazepam)   . Penicillins Rash    Patient Measurements: Height: 5\' 2"  (157.5 cm) Weight: 147 lb 7.8 oz (66.9 kg) IBW/kg (Calculated) : 50.1  Heparin Dosing Weight: 66.2kg  Vital Signs: Temp: 98.2 F (36.8 C) (08/08 1200) Temp src: Oral (08/08 1200) BP: 115/81 mmHg (08/08 0800) Pulse Rate: 74  (08/08 0800)  Labs:  Alvira Philips 11/08/11 1221 11/08/11 0453 11/08/11 0433 11/07/11 1947 11/07/11 0956 11/07/11 0950  HGB -- 12.0 -- -- 12.6 --  HCT -- 37.2 -- -- 39.1 --  PLT -- 173 -- -- 190 --  APTT -- -- -- -- -- --  LABPROT -- -- -- -- 12.6 --  INR -- -- -- -- 0.92 --  HEPARINUNFRC 0.47 -- 0.30 -- -- --  CREATININE -- -- 0.65 -- 0.81 --  CKTOTAL -- 87 -- 86 -- 103  CKMB -- 2.7 -- 2.7 -- 2.7  TROPONINI -- <0.30 -- <0.30 -- <0.30    Estimated Creatinine Clearance: 42.7 ml/min (by C-G formula based on Cr of 0.65).   Medications:  Scheduled:    . amiodarone  200 mg Oral Daily  . aspirin EC  81 mg Oral Daily  . ciprofloxacin  250 mg Oral BID  . clopidogrel  75 mg Oral Q breakfast  . furosemide  40 mg Oral Daily  . heparin  3,000 Units Intravenous Once  . hydrALAZINE      . hydrALAZINE  10 mg Intravenous Once  . hydrALAZINE  10 mg Intravenous Once  . labetalol  20 mg Intravenous Once  . levothyroxine  75 mcg Oral QAC breakfast  . loratadine  10 mg Oral Daily  . losartan  100 mg Oral Daily  . metoprolol  100 mg Oral BID  . pantoprazole  80 mg Oral Q1200  . pneumococcal 23 valent vaccine  0.5 mL Intramuscular Tomorrow-1000  . potassium chloride SA  20 mEq Oral Daily  . simvastatin  20 mg Oral q1800  . sodium chloride  3 mL  Intravenous Q12H  . traMADol  25 mg Oral QHS  . DISCONTD: aspirin EC  81 mg Oral Daily  . DISCONTD: losartan  50 mg Oral Daily  . DISCONTD: metoprolol  75 mg Oral BID  . DISCONTD: traMADol  25 mg Oral STAT  . DISCONTD: traMADol  50 mg Oral STAT   Infusions:    . heparin 950 Units/hr (11/08/11 1610)    Assessment: 78 yo female with chest pain is currently on therapeutic heparin.  /H today 12/37.2; Plt173. Heparin level this afternoon was 0.47.  Goal of Therapy:  Heparin level 0.3-0.7 units/ml Monitor platelets by anticoagulation protocol: Yes   Plan:  1) Continue heparin at 950 units/hr (=9.5 ml/hr) 2) Daily heparin level and CBC  Yani Lal, Tsz-Yin 11/08/2011,1:26 PM

## 2011-11-08 NOTE — Progress Notes (Signed)
Pt. Seen and examined. Agree with the NP/PA-C note as written.  She has ruled out for AMI with 3 negative sets of cardiac markers. Recent cardiac cath showed patent coronaries and she had a more recent low risk myoview. I suspect she has small vessel ischemia which should be treated medically. In addition, we need to council her to not necessarily seek hospitalization for chronic anginal symptoms. She will need neurology evaluation for her tremors, however, this can be accomplished as an outpatient. UA shows moderate leukocytes, nitrite negative, but many bacteria. Agree with empiric treatment for cystitis/UTI with po antibiotic (Bactrim DS 160 mg po BID). She is apparently intolerant to imdur.  I would suggest adding Ranexa 500 mg BID to see if it helps her pain.  Ok for discharge today. Follow-up with Dr. Alanda Amass.  Chrystie Nose, MD, [facility] Attending Cardiologist [facility]

## 2011-11-08 NOTE — Progress Notes (Signed)
Utilization review completed.  

## 2011-11-08 NOTE — Progress Notes (Signed)
Subjective: No complaints of chest pain or SOB  Objective: Vital signs in last 24 hours: Temp:  [97.4 F (36.3 C)-98.6 F (37 C)] 97.4 F (36.3 C) (08/08 0800) Pulse Rate:  [70-105] 74  (08/08 0800) Resp:  [12-20] 20  (08/08 0800) BP: (115-201)/(48-111) 115/81 mmHg (08/08 0800) SpO2:  [94 %-99 %] 96 % (08/08 0800) Weight:  [66.2 kg (145 lb 15.1 oz)-66.9 kg (147 lb 7.8 oz)] 66.9 kg (147 lb 7.8 oz) (08/08 0422) Weight change:  Last BM Date: 11/06/11 Intake/Output from previous day: +162 08/07 0701 - 08/08 0700 In: 162 [I.V.:162] Out: 0  Intake/Output this shift: Total I/O In: 268.7 [P.O.:240; I.V.:28.7] Out: 125 [Urine:125]  PE: General:alert and oriented, Pleasant affect, tremor of mouth somewhat, chronic hoarseness.   Heart:S1S2 RRR with soft systolic murmur Lungs:decreased in bases, no wheezes Abd:+ BS, soft, non tender, previous abd pain resolved Ext:no edema, tremors of both hands  Neuro:MAE, both legs/feet equal in push/pull  EKG: A. Pacing v sensing   Lab Results:  Basename 11/08/11 0453 11/07/11 0956  WBC 7.8 8.3  HGB 12.0 12.6  HCT 37.2 39.1  PLT 173 190   BMET  Basename 11/08/11 0433 11/07/11 0956  NA 141 139  K 3.9 4.5  CL 106 101  CO2 26 27  GLUCOSE 96 91  BUN 11 19  CREATININE 0.65 0.81  CALCIUM 8.8 9.3    Basename 11/08/11 0453 11/07/11 1947  TROPONINI <0.30 <0.30    Lab Results  Component Value Date   CHOL 150 11/08/2011   HDL 61 11/08/2011   LDLCALC 76 11/08/2011   TRIG 65 11/08/2011   CHOLHDL 2.5 11/08/2011   Lab Results  Component Value Date   HGBA1C 5.4 12/04/2010     Lab Results  Component Value Date   TSH 2.274 08/02/2011    Basename 11/08/11 0433  CHOL 150   No results found for this basename: PROTIME in the last 72 hours    EKG: Orders placed during the hospital encounter of 11/07/11  . ED EKG  . ED EKG  . EKG 12-LEAD  . EKG 12-LEAD  . EKG 12-LEAD  . EKG 12-LEAD  . EKG 12-LEAD  . EKG 12-LEAD  . EKG 12-LEAD  . EKG  12-LEAD  . EKG 12-LEAD    Studies/Results: Dg Chest 2 View  11/07/2011  *RADIOLOGY REPORT*  Clinical Data: Chest pain, cough.  CHEST - 2 VIEW  Comparison: 08/02/2011  Findings: Cardiomegaly.  Mild hyperinflation of the lungs.  Right pacer remains in place, unchanged.  No effusions.  No acute bony abnormality.  IMPRESSION: Cardiomegaly, hyperinflation.  No acute findings.  Original Report Authenticated By: Cyndie Chime, M.D.   Ct Head Wo Contrast  11/07/2011  *RADIOLOGY REPORT*  Clinical Data: Dizziness, weakness  CT HEAD WITHOUT CONTRAST  Technique:  Contiguous axial images were obtained from the base of the skull through the vertex without contrast.  Comparison: 05/21/2010  Findings: Brain atrophy with extensive white matter microvascular ischemic changes bilaterally.  No acute intracranial hemorrhage, mass lesion, definite acute infarction, focal edema, midline shift, herniation, hydrocephalus, or extra-axial fluid collection. Cisterns patent.  Cerebellar atrophy as well.  Symmetric orbits. Mastoids and sinuses clear.  IMPRESSION: Stable atrophy and extensive white matter microvascular ischemic changes  Original Report Authenticated By: Judie Petit. Ruel Favors, M.D.   Ct Angio Chest Aortic Dissect W &/or W/o  11/07/2011  *RADIOLOGY REPORT*  Clinical Data:  Chest pain radiates to the back.  CT ANGIOGRAPHY CHEST WITHOUT AND  WITH CONTRAST  Technique:  Multidetector CT imaging of the chest was performed using the standard protocol during bolus administration of intravenous contrast.  Multiplanar CT image reconstructions including MIPs were obtained to evaluate the vascular anatomy.  Contrast: OMNIPAQUE IOHEXOL 350 MG/ML SOLN  Comparison:   03/12/2008.  Findings:  Precontrast imaging shows no evidence for a hyperdense crescent within the aortic wall to suggest acute intramural hematoma.  Imaging after IV contrast administration shows no dissection flap within the thoracic aorta.  There is no thoracic aortic  aneurysm with maximum diameter of the ascending aorta measuring 3.2 cm. Atherosclerotic calcification is seen in the wall of the abdominal aorta without changes to suggest a penetrating ulcer or ulcerated plaque.  No axillary lymphadenopathy.  No mediastinal or hilar lymphadenopathy.  The heart is enlarged.  There is no pericardial or pleural effusion.  Minimal pleuroparenchymal scarring is seen in both apices. Subsegmental atelectasis or linear scarring is noted in the lingula.  No evidence for pulmonary edema or focal airspace consolidation.  Bone windows reveal no worrisome lytic or sclerotic osseous lesions.  Images which include the upper abdomen show a nodular hepatic contour, compatible with cirrhosis.  Review of the MIP images confirms the above findings.  IMPRESSION: No CT evidence for thoracic aortic aneurysm.  There is no dissection of the thoracic aorta.  No edema or focal airspace consolidation.  Cirrhotic changes in the liver.  Original Report Authenticated By: ERIC A. MANSELL, M.D.    Medications: I have reviewed the patient's current medications.    [name] amiodarone  200 mg Oral Daily  . aspirin EC  81 mg Oral Daily  . clopidogrel  75 mg Oral Q breakfast  . furosemide  40 mg Oral Daily  . heparin  3,000 Units Intravenous Once  . hydrALAZINE      . hydrALAZINE  10 mg Intravenous Once  . hydrALAZINE  10 mg Intravenous Once  . labetalol  20 mg Intravenous Once  . levothyroxine  75 mcg Oral QAC breakfast  . loratadine  10 mg Oral Daily  . losartan  100 mg Oral Daily  . metoprolol  100 mg Oral BID  . ondansetron  4 mg Oral Once  . pantoprazole  80 mg Oral Q1200  . pneumococcal 23 valent vaccine  0.5 mL Intramuscular Tomorrow-1000  . potassium chloride SA  20 mEq Oral Daily  . simvastatin  20 mg Oral q1800  . sodium chloride  3 mL Intravenous Q12H  . traMADol  25 mg Oral QHS  . traMADol  25 mg Oral STAT  . DISCONTD: aspirin EC  81 mg Oral Daily  . DISCONTD: losartan  50 mg Oral  Daily  . DISCONTD: metoprolol  75 mg Oral BID  . DISCONTD: traMADol  50 mg Oral STAT   Assessment/Plan: Principal Problem:  *Chest pain at rest Active Problems:  CORONARY ARTERY DISEASE  H/O cardiac pacemaker, Medtronic versa implanted 01/2008, though original implanted 2000 for syncope and heart block and ventricular asystole.  Paroxysmal a-fib, history of, off coumadin since 10/2010, without recurrence of afib.  HYPOTHYROIDISM  Dizziness, near syncope  PLAN: Negative MI, in May after admit for chest pain neg. nuc study. Accelerated HTN this admit. We have given IV hydralazine X 2, we tried Labetalol without change in BP, lopressor increase yesterday and I increased Losartan today.   Now BP 115/81.  Yesterday pk 201/99 and pt. Symptomatic with extreme weakness, though no SOB or Chest pain.  ? D/c IV Heparin?  Abnormal u/a foul smelling per nurse.  Hx of UTIs will send culture and add ABX.  CT Head neg, recent history of weakness rt. Leg and increase of tremor.   Negative ct of the chest.    TSH pending.  ? Neuro consult for leg weakness and increase of tremors.  Hx of essential tremor, though no Neuro work up.      Pacer check yesterday without PAF.  ? Dizziness at home related to HTN?  If no further chest pain ambulate. Will ask PT to assist to evaluate.  LOS: 1 day   INGOLD,LAURA R 11/08/2011, 10:00 AM

## 2011-11-08 NOTE — Progress Notes (Signed)
ANTICOAGULATION CONSULT NOTE - Follow Up Consult  Pharmacy Consult for heparin Indication: chest pain/ACS  Labs:  Basename 11/08/11 0453 11/08/11 0433 11/07/11 1947 11/07/11 0956 11/07/11 0950  HGB 12.0 -- -- 12.6 --  HCT 37.2 -- -- 39.1 --  PLT 173 -- -- 190 --  APTT -- -- -- -- --  LABPROT -- -- -- 12.6 --  INR -- -- -- 0.92 --  HEPARINUNFRC -- 0.30 -- -- --  CREATININE -- -- -- 0.81 --  CKTOTAL -- -- 86 -- 103  CKMB -- -- 2.7 -- 2.7  TROPONINI -- -- <0.30 -- <0.30    Assessment/Plan:  78yo female therapeutic on heparin with initial dosing for CP though at very low end of goal.  Will increase gtt slightly to 950 units/hr and confirm with additional level.  Colleen Can PharmD BCPS 11/08/2011,5:32 AM

## 2011-11-08 NOTE — Progress Notes (Signed)
Discharge pt home via w/c with belongigns, d/c instructions given and explained via teach back, f/u appt and exit care note given on chest pain, [name] Luna, [name]

## 2011-11-10 LAB — URINE CULTURE

## 2011-11-16 NOTE — Discharge Summary (Signed)
Physician Discharge Summary  Patient ID: [name] Luna MRN: [id number] DOB/AGE: 76-Dec-1922 76 y.o.  Admit date: 11/07/2011 Discharge date: 11/16/2011  Admission Diagnoses:  Chest Pain  Discharge Diagnoses:  Principal Problem:  *Chest pain at rest Active Problems:  HYPOTHYROIDISM  CORONARY ARTERY DISEASE  H/O cardiac pacemaker, Medtronic versa implanted 01/2008, though original implanted 2000 for syncope and heart block and ventricular asystole.  Paroxysmal a-fib, history of, off coumadin since 10/2010, without recurrence of afib.  Dizziness, near syncope  HTN (hypertension), accelerated this admit  Abnormal urinalysis   Discharged Condition: stable  Hospital Course:   78 year old W,W female that lives with her son, was awakened from sleep with midsternal chest pain that radiated to both arms and to her back. Associated symptoms were SOB, Nausea, no diaphoresis. She sat up for awhile -her son checked her BP and it was 190/70, they came to ER. She was given IV morphine with resolution of symptoms.  EKG SR with atrial pacing and long AV conduction. Prior to today she has increased weakness and several episodes of dizziness, at times feeling she may pass out. Also weakness of Rt. Leg, almost dragging rt. Foot.   Hx of CAD With prior PCI with cutting balloon atherectomy and DES stenting of the LAD in 2008. In October 2011 she had 75% narrowing in her LAD stent and was restented with another drug-eluting Cypher stent. In June 2012 she was readmitted with chest pain recathed at that time which was revealing 70% in-stent restenosis in the LAD stent which was treated with a 3.8 DES resolute stent. September 2012 she was readmitted for chest pain cardiac cath at that time revealed patent stents. In May of this year she was admitted with chest pain negative EKG negative enzymes and she was set up for an outpatient Lexiscan Myoview(08/2011) which was negative for ischemia EF was normal and was felt to be  a low risk study.   Other history does include chronic hoarseness and speech impediment which is stable chest GI issues with a history of esophageal spasm and/or achalasia and a dilatation of her esophagus at one point.  She has a history of paroxysmal atrial fibrillation and at one point was on Coumadin aspirin and Plavix but the Coumadin has been DC'd. She also has a permanent pacemaker a Medtronic versa implanted in 2009 which was end-of-life change her initial pacemaker was 2000 implanted for syncope and heart block and ventricular asystole.  She was admitted and ruled out for MI.  She was given IV hydralazine x2 for accelerated HTN.  Lopressor and losartan were increased.  BP improved to 115/81. Ct angio of the chest showed no evidence for thoracic aortic aneurysm or dissection of the thoracic aorta. No edema or focal airspace consolidation. Cirrhotic changes in the liver.  CT of the head showed stable atrophy and extensive white matter microvascular ischemic changes.  She will need neurology evaluation for her tremors as an outpatient. UA showed moderate leukocytes, nitrite negative, and many bacteria.   She was started on Bactrim DS 160 mg po BID.  Ranexa 500 mg BID added.  She was discharged home in stable condition after being seen by Dr. Rennis Golden.    Consults: None  Significant Diagnostic Studies:  CHEST - 2 VIEW  Comparison: 08/02/2011  Findings: Cardiomegaly. Mild hyperinflation of the lungs. Right  pacer remains in place, unchanged. No effusions. No acute bony  abnormality.  IMPRESSION:  Cardiomegaly, hyperinflation. No acute findings.   CT HEAD WITHOUT  CONTRAST  Technique: Contiguous axial images were obtained from the base of  the skull through the vertex without contrast.  Comparison: 05/21/2010  Findings: Brain atrophy with extensive white matter microvascular  ischemic changes bilaterally. No acute intracranial hemorrhage,  mass lesion, definite acute infarction, focal edema,  midline shift,  herniation, hydrocephalus, or extra-axial fluid collection.  Cisterns patent. Cerebellar atrophy as well. Symmetric orbits.  Mastoids and sinuses clear.  IMPRESSION:  Stable atrophy and extensive white matter microvascular ischemic  changes  CT ANGIOGRAPHY CHEST WITHOUT AND WITH CONTRAST  Technique: Multidetector CT imaging of the chest was performed  using the standard protocol during bolus administration of  intravenous contrast. Multiplanar CT image reconstructions  including MIPs were obtained to evaluate the vascular anatomy.  Contrast: OMNIPAQUE IOHEXOL 350 MG/ML SOLN  Comparison: 03/12/2008.  Findings: Precontrast imaging shows no evidence for a hyperdense  crescent within the aortic wall to suggest acute intramural  hematoma.  Imaging after IV contrast administration shows no dissection flap  within the thoracic aorta. There is no thoracic aortic aneurysm  with maximum diameter of the ascending aorta measuring 3.2 cm.  Atherosclerotic calcification is seen in the wall of the abdominal  aorta without changes to suggest a penetrating ulcer or ulcerated  plaque.  No axillary lymphadenopathy. No mediastinal or hilar  lymphadenopathy. The heart is enlarged. There is no pericardial  or pleural effusion.  Minimal pleuroparenchymal scarring is seen in both apices.  Subsegmental atelectasis or linear scarring is noted in the  lingula. No evidence for pulmonary edema or focal airspace  consolidation.  Bone windows reveal no worrisome lytic or sclerotic osseous  lesions. Images which include the upper abdomen show a nodular  hepatic contour, compatible with cirrhosis.  Review of the MIP images confirms the above findings.  IMPRESSION:  No CT evidence for thoracic aortic aneurysm. There is no  dissection of the thoracic aorta.  No edema or focal airspace consolidation.  Cirrhotic changes in the liver.    BMET    Component Value Date/Time   NA 141  11/08/2011 0433   K 3.9 11/08/2011 0433   CL 106 11/08/2011 0433   CO2 26 11/08/2011 0433   GLUCOSE 96 11/08/2011 0433   BUN 11 11/08/2011 0433   CREATININE 0.65 11/08/2011 0433   CALCIUM 8.8 11/08/2011 0433   GFRNONAA 76* 11/08/2011 0433   GFRAA 89* 11/08/2011 0433    CBC    Component Value Date/Time   WBC 7.8 11/08/2011 0453   RBC 4.16 11/08/2011 0453   HGB 12.0 11/08/2011 0453   HCT 37.2 11/08/2011 0453   PLT 173 11/08/2011 0453   MCV 89.4 11/08/2011 0453   MCH 28.8 11/08/2011 0453   MCHC 32.3 11/08/2011 0453   RDW 15.5 11/08/2011 0453   LYMPHSABS 1.1 12/21/2010 1658   MONOABS 0.6 12/21/2010 1658   EOSABS 0.1 12/21/2010 1658   BASOSABS 0.0 12/21/2010 1658   Lipid Panel     Component Value Date/Time   CHOL 150 11/08/2011 0433   TRIG 65 11/08/2011 0433   HDL 61 11/08/2011 0433   CHOLHDL 2.5 11/08/2011 0433   VLDL 13 11/08/2011 0433   LDLCALC 76 11/08/2011 0433      Treatments: Hydralazine, morphine, lopressor, losartan, Bactrim, Ranexa   Discharge Exam: Blood pressure 115/81, pulse 74, temperature 98.2 F (36.8 C), temperature source Oral, resp. rate 20, height 5\' 2"  (1.575 m), weight 66.9 kg (147 lb 7.8 oz), SpO2 96.00%.   Disposition: 01-Home or Self Care  Medication List  As of 11/16/2011 10:39 AM   TAKE these medications         amiodarone 200 MG tablet   Commonly known as: PACERONE   Take 200 mg by mouth daily.      aspirin 81 MG EC tablet   Take 81 mg by mouth daily.      calcium carbonate 500 MG chewable tablet   Commonly known as: TUMS - dosed in mg elemental calcium   Chew 1 tablet by mouth daily.      clopidogrel 75 MG tablet   Commonly known as: PLAVIX   Take 75 mg by mouth daily.      esomeprazole 40 MG capsule   Commonly known as: NEXIUM   Take 40 mg by mouth 2 (two) times daily.      furosemide 40 MG tablet   Commonly known as: LASIX   Take 1 tablet (40 mg total) by mouth daily.      levothyroxine 75 MCG tablet   Commonly known as: SYNTHROID, LEVOTHROID   Take 75 mcg by mouth  daily.      loratadine 10 MG tablet   Commonly known as: CLARITIN   Take 10 mg by mouth daily.      losartan 100 MG tablet   Commonly known as: COZAAR   Take 1 tablet (100 mg total) by mouth daily.      metoprolol 100 MG tablet   Commonly known as: LOPRESSOR   Take 1 tablet (100 mg total) by mouth 2 (two) times daily.      multivitamin capsule   Take 1 capsule by mouth daily.      nitroGLYCERIN 0.4 MG SL tablet   Commonly known as: NITROSTAT   Place 0.4 mg under the tongue every 5 (five) minutes as needed.      potassium chloride SA 20 MEQ tablet   Commonly known as: K-DUR,KLOR-CON   Take 20 mEq by mouth daily.      PreserVision/Lutein Caps   Take by mouth 2 (two) times daily.      ranolazine 500 MG 12 hr tablet   Commonly known as: RANEXA   Take 1 tablet (500 mg total) by mouth 2 (two) times daily.      simvastatin 20 MG tablet   Commonly known as: ZOCOR   Take 20 mg by mouth every evening.      traMADol 50 MG tablet   Commonly known as: ULTRAM   Take 25 mg by mouth at bedtime.           Follow-up Information    Follow up with Governor Rooks, MD on 12/04/2011. (10:45 AM)    Contact information:   [address]  [place] [address] [phone]          Signed: Wilburt Finlay 11/16/2011, 10:39 AM

## 2012-01-20 ENCOUNTER — Encounter (HOSPITAL_COMMUNITY): Payer: Self-pay | Admitting: Emergency Medicine

## 2012-01-20 ENCOUNTER — Inpatient Hospital Stay (HOSPITAL_COMMUNITY): Payer: Medicare Other

## 2012-01-20 ENCOUNTER — Emergency Department (HOSPITAL_COMMUNITY): Payer: Medicare Other

## 2012-01-20 ENCOUNTER — Inpatient Hospital Stay (HOSPITAL_COMMUNITY)
Admission: EM | Admit: 2012-01-20 | Discharge: 2012-01-26 | DRG: 690 | Disposition: A | Discharge status: Home Health | Payer: Medicare Other | Attending: Internal Medicine | Admitting: Internal Medicine

## 2012-01-20 DIAGNOSIS — I480 Paroxysmal atrial fibrillation: Secondary | ICD-10-CM

## 2012-01-20 DIAGNOSIS — I5189 Other ill-defined heart diseases: Secondary | ICD-10-CM

## 2012-01-20 DIAGNOSIS — Z8719 Personal history of other diseases of the digestive system: Secondary | ICD-10-CM

## 2012-01-20 DIAGNOSIS — S0003XA Contusion of scalp, initial encounter: Secondary | ICD-10-CM | POA: Diagnosis present

## 2012-01-20 DIAGNOSIS — D72829 Elevated white blood cell count, unspecified: Secondary | ICD-10-CM | POA: Diagnosis present

## 2012-01-20 DIAGNOSIS — K219 Gastro-esophageal reflux disease without esophagitis: Secondary | ICD-10-CM

## 2012-01-20 DIAGNOSIS — R0600 Dyspnea, unspecified: Secondary | ICD-10-CM

## 2012-01-20 DIAGNOSIS — I519 Heart disease, unspecified: Secondary | ICD-10-CM | POA: Diagnosis present

## 2012-01-20 DIAGNOSIS — L259 Unspecified contact dermatitis, unspecified cause: Secondary | ICD-10-CM

## 2012-01-20 DIAGNOSIS — R55 Syncope and collapse: Secondary | ICD-10-CM

## 2012-01-20 DIAGNOSIS — R079 Chest pain, unspecified: Secondary | ICD-10-CM

## 2012-01-20 DIAGNOSIS — G250 Essential tremor: Secondary | ICD-10-CM

## 2012-01-20 DIAGNOSIS — G252 Other specified forms of tremor: Secondary | ICD-10-CM | POA: Diagnosis present

## 2012-01-20 DIAGNOSIS — Z7982 Long term (current) use of aspirin: Secondary | ICD-10-CM

## 2012-01-20 DIAGNOSIS — F329 Major depressive disorder, single episode, unspecified: Secondary | ICD-10-CM

## 2012-01-20 DIAGNOSIS — K229 Disease of esophagus, unspecified: Secondary | ICD-10-CM

## 2012-01-20 DIAGNOSIS — N179 Acute kidney failure, unspecified: Secondary | ICD-10-CM

## 2012-01-20 DIAGNOSIS — I10 Essential (primary) hypertension: Secondary | ICD-10-CM

## 2012-01-20 DIAGNOSIS — R296 Repeated falls: Secondary | ICD-10-CM | POA: Diagnosis present

## 2012-01-20 DIAGNOSIS — I2510 Atherosclerotic heart disease of native coronary artery without angina pectoris: Secondary | ICD-10-CM

## 2012-01-20 DIAGNOSIS — D649 Anemia, unspecified: Secondary | ICD-10-CM

## 2012-01-20 DIAGNOSIS — S1093XA Contusion of unspecified part of neck, initial encounter: Secondary | ICD-10-CM | POA: Diagnosis present

## 2012-01-20 DIAGNOSIS — N390 Urinary tract infection, site not specified: Principal | ICD-10-CM

## 2012-01-20 DIAGNOSIS — R110 Nausea: Secondary | ICD-10-CM

## 2012-01-20 DIAGNOSIS — E039 Hypothyroidism, unspecified: Secondary | ICD-10-CM

## 2012-01-20 DIAGNOSIS — R8290 Unspecified abnormal findings in urine: Secondary | ICD-10-CM

## 2012-01-20 DIAGNOSIS — R350 Frequency of micturition: Secondary | ICD-10-CM

## 2012-01-20 DIAGNOSIS — Z950 Presence of cardiac pacemaker: Secondary | ICD-10-CM

## 2012-01-20 DIAGNOSIS — F0393 Unspecified dementia, unspecified severity, with mood disturbance: Secondary | ICD-10-CM

## 2012-01-20 DIAGNOSIS — R42 Dizziness and giddiness: Secondary | ICD-10-CM

## 2012-01-20 LAB — BASIC METABOLIC PANEL
BUN: 18 mg/dL (ref 6–23)
Chloride: 100 mEq/L (ref 96–112)
GFR calc Af Amer: 50 mL/min — ABNORMAL LOW (ref >90)
GFR calc non Af Amer: 43 mL/min — ABNORMAL LOW (ref >90)
Potassium: 3.6 mEq/L (ref 3.5–5.1)
Sodium: 141 mEq/L (ref 135–145)

## 2012-01-20 LAB — CBC WITH DIFFERENTIAL/PLATELET
Basophils Relative: 0 % (ref 0–1)
Hemoglobin: 12.3 g/dL (ref 12.0–15.0)
Lymphs Abs: 0.8 10*3/uL (ref 0.7–4.0)
Monocytes Relative: 7 % (ref 3–12)
Neutro Abs: 9.2 10*3/uL — ABNORMAL HIGH (ref 1.7–7.7)
Neutrophils Relative %: 85 % — ABNORMAL HIGH (ref 43–77)
Platelets: 208 10*3/uL (ref 150–400)
RBC: 4.14 MIL/uL (ref 3.87–5.11)

## 2012-01-20 LAB — URINALYSIS, ROUTINE W REFLEX MICROSCOPIC
Hgb urine dipstick: NEGATIVE
Nitrite: POSITIVE — AB
Protein, ur: NEGATIVE mg/dL
Specific Gravity, Urine: 1.027 (ref 1.005–1.030)
Urobilinogen, UA: 1 mg/dL (ref 0.0–1.0)

## 2012-01-20 LAB — CBC
MCH: 29.5 pg (ref 26.0–34.0)
MCV: 91.4 fL (ref 78.0–100.0)
Platelets: 166 10*3/uL (ref 150–400)
RBC: 3.83 MIL/uL — ABNORMAL LOW (ref 3.87–5.11)
RDW: 15.4 % (ref 11.5–15.5)

## 2012-01-20 LAB — CK TOTAL AND CKMB (NOT AT ARMC)
CK, MB: 2.1 ng/mL (ref 0.3–4.0)
Relative Index: INVALID (ref 0.0–2.5)
Total CK: 69 U/L (ref 7–177)

## 2012-01-20 LAB — MAGNESIUM: Magnesium: 2.2 mg/dL (ref 1.5–2.5)

## 2012-01-20 LAB — URINE MICROSCOPIC-ADD ON

## 2012-01-20 LAB — CREATININE, SERUM: Creatinine, Ser: 0.95 mg/dL (ref 0.50–1.10)

## 2012-01-20 MED ORDER — TRAMADOL HCL 50 MG PO TABS
25.0000 mg | ORAL_TABLET | Freq: Every day | ORAL | Status: DC
  Administered 2012-01-20 – 2012-01-25 (×6): 25 mg via ORAL
  Filled 2012-01-20 (×6): qty 1

## 2012-01-20 MED ORDER — SODIUM CHLORIDE 0.9 % IV SOLN
Freq: Once | INTRAVENOUS | Status: AC
  Administered 2012-01-20: 18:00:00 via INTRAVENOUS

## 2012-01-20 MED ORDER — METOPROLOL TARTRATE 100 MG PO TABS
100.0000 mg | ORAL_TABLET | Freq: Two times a day (BID) | ORAL | Status: DC
  Administered 2012-01-20 – 2012-01-24 (×9): 100 mg via ORAL
  Filled 2012-01-20 (×12): qty 1

## 2012-01-20 MED ORDER — DEXTROSE 5 % IV SOLN
1.0000 g | Freq: Once | INTRAVENOUS | Status: AC
  Administered 2012-01-20: 1 g via INTRAVENOUS
  Filled 2012-01-20: qty 10

## 2012-01-20 MED ORDER — ASPIRIN 81 MG PO TBEC: 81.0000 mg | DELAYED_RELEASE_TABLET | Freq: Every day | ORAL | Status: DC

## 2012-01-20 MED ORDER — ONDANSETRON HCL 4 MG PO TABS: 4.0000 mg | ORAL_TABLET | Freq: Four times a day (QID) | ORAL | Status: DC | PRN

## 2012-01-20 MED ORDER — PANTOPRAZOLE SODIUM 40 MG PO TBEC
40.0000 mg | DELAYED_RELEASE_TABLET | Freq: Two times a day (BID) | ORAL | Status: DC
  Administered 2012-01-20 – 2012-01-26 (×12): 40 mg via ORAL
  Filled 2012-01-20 (×10): qty 1

## 2012-01-20 MED ORDER — NITROGLYCERIN 0.4 MG SL SUBL
0.4000 mg | SUBLINGUAL_TABLET | SUBLINGUAL | Status: DC | PRN
  Administered 2012-01-23 (×2): 0.4 mg via SUBLINGUAL
  Filled 2012-01-20: qty 25

## 2012-01-20 MED ORDER — ACETAMINOPHEN 325 MG PO TABS
650.0000 mg | ORAL_TABLET | Freq: Once | ORAL | Status: AC
  Administered 2012-01-20: 650 mg via ORAL
  Filled 2012-01-20: qty 2

## 2012-01-20 MED ORDER — ONDANSETRON HCL 4 MG/2ML IJ SOLN: 4.0000 mg | Freq: Four times a day (QID) | INTRAMUSCULAR | Status: DC | PRN

## 2012-01-20 MED ORDER — AMIODARONE HCL 200 MG PO TABS
200.0000 mg | ORAL_TABLET | Freq: Every day | ORAL | Status: DC
  Administered 2012-01-21 – 2012-01-26 (×6): 200 mg via ORAL
  Filled 2012-01-20 (×6): qty 1

## 2012-01-20 MED ORDER — SIMVASTATIN 20 MG PO TABS
20.0000 mg | ORAL_TABLET | Freq: Every evening | ORAL | Status: DC
  Administered 2012-01-20 – 2012-01-25 (×6): 20 mg via ORAL
  Filled 2012-01-20 (×7): qty 1

## 2012-01-20 MED ORDER — DEXTROSE 5 % IV SOLN
1.0000 g | INTRAVENOUS | Status: DC
  Filled 2012-01-20 (×2): qty 10

## 2012-01-20 MED ORDER — ACETAMINOPHEN 325 MG PO TABS
650.0000 mg | ORAL_TABLET | Freq: Four times a day (QID) | ORAL | Status: DC | PRN
  Administered 2012-01-21 – 2012-01-25 (×5): 650 mg via ORAL
  Filled 2012-01-20 (×5): qty 2

## 2012-01-20 MED ORDER — HEPARIN SODIUM (PORCINE) 5000 UNIT/ML IJ SOLN
5000.0000 [IU] | Freq: Three times a day (TID) | INTRAMUSCULAR | Status: DC
  Administered 2012-01-20 – 2012-01-23 (×9): 5000 [IU] via SUBCUTANEOUS
  Filled 2012-01-20 (×11): qty 1

## 2012-01-20 MED ORDER — LOSARTAN POTASSIUM 50 MG PO TABS
150.0000 mg | ORAL_TABLET | Freq: Every day | ORAL | Status: DC
  Administered 2012-01-21 – 2012-01-24 (×4): 150 mg via ORAL
  Filled 2012-01-20 (×4): qty 3

## 2012-01-20 MED ORDER — ACETAMINOPHEN 650 MG RE SUPP: 650.0000 mg | Freq: Four times a day (QID) | RECTAL | Status: DC | PRN

## 2012-01-20 MED ORDER — CLOPIDOGREL BISULFATE 75 MG PO TABS
75.0000 mg | ORAL_TABLET | Freq: Every day | ORAL | Status: DC
  Administered 2012-01-21 – 2012-01-26 (×6): 75 mg via ORAL
  Filled 2012-01-20 (×6): qty 1

## 2012-01-20 MED ORDER — LEVOTHYROXINE SODIUM 75 MCG PO TABS
75.0000 ug | ORAL_TABLET | Freq: Every day | ORAL | Status: DC
  Administered 2012-01-21 – 2012-01-26 (×6): 75 ug via ORAL
  Filled 2012-01-20 (×6): qty 1

## 2012-01-20 MED ORDER — SODIUM CHLORIDE 0.9 % IJ SOLN
3.0000 mL | Freq: Two times a day (BID) | INTRAMUSCULAR | Status: DC
  Administered 2012-01-21 – 2012-01-25 (×10): 3 mL via INTRAVENOUS

## 2012-01-20 MED ORDER — POLYETHYLENE GLYCOL 3350 17 G PO PACK
17.0000 g | PACK | Freq: Every day | ORAL | Status: DC | PRN
  Administered 2012-01-22 – 2012-01-26 (×2): 17 g via ORAL
  Filled 2012-01-20 (×2): qty 1

## 2012-01-20 MED ORDER — SODIUM CHLORIDE 0.9 % IV SOLN
INTRAVENOUS | Status: AC
  Administered 2012-01-20: 125 mL/h via INTRAVENOUS

## 2012-01-20 MED ORDER — CALCIUM CARBONATE ANTACID 500 MG PO CHEW
1.0000 | CHEWABLE_TABLET | Freq: Every day | ORAL | Status: DC
  Administered 2012-01-21 – 2012-01-26 (×6): 200 mg via ORAL
  Filled 2012-01-20 (×6): qty 1

## 2012-01-20 MED ORDER — ASPIRIN EC 81 MG PO TBEC
81.0000 mg | DELAYED_RELEASE_TABLET | Freq: Every day | ORAL | Status: DC
  Administered 2012-01-21 – 2012-01-26 (×6): 81 mg via ORAL
  Filled 2012-01-20 (×6): qty 1

## 2012-01-20 NOTE — H&P (Signed)
[facility] History and Physical  [name] Luna JYN:[id number] DOB: 19-Jan-1921 DOA: 01/20/2012  Referring physician: Dr. Weldon Inches PCP: Evette Georges, MD  Specialists:   Chief Complaint: syncope and collapse  HPI: [name] Luna is a 78 y.o. female  Is an 78 year old female with past medical history of hypertension coronary artery C. status post PCI with the S. in 2008 repeated a drug-eluting stent in 2011 with hypertension, heart failure hypothyroidism, status post pacemaker that comes in for syncope. He relates she was in her usual state on chronic when she suddenly felt dizzy and fell to the floor. She had she relates everything became very of right. No other prodromal symptoms. Her son who was in the other room when writing and she was awake and able to answer simple questions. She relates no palpitation no shortness of breath no chest pain. She relates no recent diarrhea nausea vomiting fevers or sick contacts. She relates no this or  Review of Systems: The patient denies anorexia, fever, weight loss,, vision loss, decreased hearing, hoarseness, chest pain, syncope, dyspnea on exertion, peripheral edema, balance deficits, hemoptysis, abdominal pain, melena, hematochezia, severe indigestion/heartburn, hematuria, incontinence, genital sores, muscle weakness, suspicious skin lesions, difficulty walking, depression, unusual weight change, abnormal bleeding, enlarged lymph nodes, angioedema, and breast masses.    Past Medical History  Diagnosis Date  . Hypertension   . CHF (congestive heart failure)   . A-fib   . Hypothyroid   . GERD (gastroesophageal reflux disease)   . Hypothyroidism   . Bradycardia   . Pancreatitis   . Coronary artery disease   . Shingles   . CORONARY ARTERY DISEASE 11/28/2006    Qualifier: Diagnosis of  By: Tawanna Cooler MD, Eugenio Hoes   . Pacemaker   . Paroxysmal a-fib, history of 08/02/2011  . Tremor, essential 08/02/2011  . Esophageal disorder, with history  of dilatation 08/02/2011  . Angina   . H/O hiatal hernia   . Neuromuscular disorder     essential tremors  . UTI (lower urinary tract infection) 08/03/2011  . H/O cardiac pacemaker, Medtronic versa implanted 01/2008, though original implanted 2000 for syncope and heart block and ventricular asystole. 08/02/2011  . Dizziness, near syncope 11/07/2011  . HTN (hypertension), accelerated this admit 11/08/2011   Past Surgical History  Procedure Date  . Permanent pacemaker   . S/p total hyserectomy and left oophorectomy   . S/p cholecystectomy   . Repair spigelian hernia   . S/p childbirth     x2  . S/p cataract extraction with lens implant   . S/p appendectomy   . Splint 2008  . Coronary angioplasty   . Insert / replace / remove pacemaker   . Cholecystectomy   . Appendectomy    Social History:  reports that she has never smoked. She has never used smokeless tobacco. She reports that she does not drink alcohol or use illicit drugs. She at home with her son can perform all ADL  Allergies  Allergen Reactions  . Codeine Other (See Comments)    Passes out  . Isosorbide Mononitrate     REACTION: fainted  . Lorazepam Other (See Comments)    hallucinations  . Prednisone Other (See Comments)    nausea  . Valium (Diazepam)   . Penicillins Rash    Family History  Problem Relation Age of Onset  . Sudden death Mother     Prior to Admission medications   Medication Sig Start Date End Date Taking? Authorizing Provider  amiodarone (PACERONE)  200 MG tablet Take 200 mg by mouth daily.     Yes Historical Provider, MD  aspirin 81 MG EC tablet Take 81 mg by mouth daily.     Yes Historical Provider, MD  calcium carbonate (TUMS - DOSED IN MG ELEMENTAL CALCIUM) 500 MG chewable tablet Chew 1 tablet by mouth daily.    Yes Historical Provider, MD  clopidogrel (PLAVIX) 75 MG tablet Take 75 mg by mouth daily.     Yes Historical Provider, MD  furosemide (LASIX) 40 MG tablet Take 40 mg by mouth daily. 11/08/11  11/07/12 Yes Wilburt Finlay, PA  levothyroxine (SYNTHROID, LEVOTHROID) 75 MCG tablet Take 75 mcg by mouth daily.   Yes Historical Provider, MD  loratadine (CLARITIN) 10 MG tablet Take 10 mg by mouth daily.     Yes Historical Provider, MD  losartan (COZAAR) 100 MG tablet Take 150 mg by mouth daily. 11/08/11 11/07/12 Yes Wilburt Finlay, PA  metoprolol (LOPRESSOR) 100 MG tablet Take 100 mg by mouth 2 (two) times daily. 11/08/11 11/07/12 Yes Wilburt Finlay, PA  Multiple Vitamin (MULTIVITAMIN) capsule Take 1 capsule by mouth daily.     Yes Historical Provider, MD  Multiple Vitamins-Minerals (PRESERVISION/LUTEIN) CAPS Take by mouth 2 (two) times daily.     Yes Historical Provider, MD  pantoprazole (PROTONIX) 40 MG tablet Take 40 mg by mouth 2 (two) times daily.   Yes Historical Provider, MD  potassium chloride SA (K-DUR,KLOR-CON) 20 MEQ tablet Take 20 mEq by mouth daily.    Yes Historical Provider, MD  simvastatin (ZOCOR) 20 MG tablet Take 20 mg by mouth every evening.   Yes Historical Provider, MD  traMADol (ULTRAM) 50 MG tablet Take 25 mg by mouth at bedtime.    Yes Historical Provider, MD  nitroGLYCERIN (NITROSTAT) 0.4 MG SL tablet Place 0.4 mg under the tongue every 5 (five) minutes as needed.      Historical Provider, MD   Physical Exam: Filed Vitals:   01/20/12 1152 01/20/12 1536 01/20/12 1731 01/20/12 1756  BP: 152/75  174/67 159/66  Pulse: 70  79   Temp: 97.4 F (36.3 C)  98.4 F (36.9 C)   TempSrc: Oral  Oral   Resp: 20 15 18 18   SpO2: 96%  96% 98%     General:  Awake alert and oriented times  Eyes: Anicteric pupils equally round and reactive to light extraocular movements are intact  ENT: Dry mucous  Neck: No JVD  Cardiovascular: Rate and rhythm with positive S1 and S2 are appreciated murmurs rubs gallops  Respiratory: Movement and clear to auscultation  Abdomen: Bowel sounds nontender nondistended and soft  Skin: Multiple bruises in his arm  Musculoskeletal: Intact  Psychiatric:  Appropriate  Neurologic: Nonfocal  Labs on Admission:  Basic Metabolic Panel:  Lab 01/20/12 2956  NA 141  K 3.6  CL 100  CO2 33*  GLUCOSE 114*  BUN 18  CREATININE 1.10  CALCIUM 9.5  MG --  PHOS --   Liver Function Tests: No results found for this basename: AST:5,ALT:5,ALKPHOS:5,BILITOT:5,PROT:5,ALBUMIN:5 in the last 168 hours No results found for this basename: LIPASE:5,AMYLASE:5 in the last 168 hours No results found for this basename: AMMONIA:5 in the last 168 hours CBC:  Lab 01/20/12 1301  WBC 10.8*  NEUTROABS 9.2*  HGB 12.3  HCT 38.2  MCV 92.3  PLT 208   Cardiac Enzymes:  Lab 01/20/12 1447  CKTOTAL --  CKMB --  CKMBINDEX --  TROPONINI <0.30    BNP (last 3 results)  Basename 11/07/11 0956  PROBNP 384.4   CBG: No results found for this basename: GLUCAP:5 in the last 168 hours  Radiological Exams on Admission: Ct Head Wo Contrast  01/20/2012  *RADIOLOGY REPORT*  Clinical Data: Dizziness and blurry vision  CT HEAD WITHOUT CONTRAST  Technique:  Contiguous axial images were obtained from the base of the skull through the vertex without contrast.  Comparison: 11/07/2011  Findings: There is diffuse patchy low density throughout the subcortical and periventricular white matter consistent with chronic small vessel ischemic change.  There is prominence of the sulci and ventricles consistent with brain atrophy.  There is no evidence for acute brain infarct, hemorrhage or mass.  There is a right posterior scalp hematoma which measures 7.3 mm in thickness, image 26.  The paranasal sinuses and mastoid air cells appear clear.  The skull appears intact.  IMPRESSION:  1.  No acute intracranial abnormalities. 2.  Small vessel ischemic disease and brain atrophy. 3.  Right posterior scalp hematoma.   Original Report Authenticated By: Rosealee Albee, M.D.     EKG: Independently reviewed. Paced rhythm  Assessment/Plan Principal Problem: SYMPTOM, SYNCOPE AND COLLAPSE: She  relates blurry vision and some dizziness before the fall, check  orthostatics . Continue IV fluids I agree with Rocephin check urine cultures. Admit to telemetry and cycle her cardiac enzymes x3. Also get a 2-D echo to rule out aortic stenosis. The likely cause of her syncope would be urinary tract infection with decreased intravascular volume.   UTI (urinary tract infection) -I agree with Rocephin IV, agree with urine cultures. She has mild leukocytosis which could be secondary to her urinary tract infection. I will go and check a chest x-ray.  Acute kidney injury: -BaselineI creatinine 0.6 today is 1.1. This probably secondary to decreased intravascular volume versus secondary to her urinary tract infection. I will going to start her on IV fluids. We do not have an echocardiogram in the systems are reviewed gentle with IV fluids.we'll check a basic metabolic panel in the morning.   HYPOTHYROIDISM CHECK A TSH CONTINUE SYNTHROID.    HYPERTENSION HOLD DIURETICS CONTINUE BETA BLOCKERS.    CORONARY ARTERY DISEASE  continue aspirin and Plavix  Time spent: 60 minutes  Marinda Elk [facility] Pager [phone]  If 7PM-7AM, please contact night-coverage [email or web address] Password [email or web address] 01/20/2012, 7:04 PM

## 2012-01-20 NOTE — ED Provider Notes (Signed)
MSE was initiated and I personally evaluated the patient and placed orders (if any) at  2:44 PM on January 20, 2012.  CC: syncope, witnessed, by family  Patient states her vision went blurry and she passed out while standing. +LOC, striking head on floor. No significant prodrome. No CP or palpitations. Patient has h/o Medtronic pacemaker. She is on plavix. Asked for pacemaker to be interrogated. CT head ordered.   Exam:  Gen NAD; Head occipital hematoma, Heart RRR, nml S1,S2, no m/r/g; Lungs CTAB; Abd soft, NT, no rebound or guarding; Ext 2+ pedal pulses bilaterally, no edema.   The patient appears stable so that the remainder of the MSE may be completed by another provider.  Renne Crigler, [place] 01/20/12 1453

## 2012-01-20 NOTE — ED Notes (Signed)
Pt reports had dizziness and fell. Pt denies +LOC. Pt reports hit head and continues to have dizziness. Pt with equal grips B/L, no facial droop of slurred speech noted.

## 2012-01-20 NOTE — ED Notes (Addendum)
PT reports she was standing up when she became dizzy all of a sudden and her vision became blurry then she fainted. Pt hit back of head and has a medium size goose egg on right posterior head. Pt c/o HA and right elbow pain. Pt a&ox4. Pt able to walk from wheelchair to bed with no assistance. Pt denies CP and SOB.

## 2012-01-20 NOTE — ED Provider Notes (Addendum)
History     CSN: [id number]  Arrival date & time 01/20/12  1133   First MD Initiated Contact with Patient 01/20/12 1503      Chief Complaint  Patient presents with  . Near Syncope  . Fall  . Head Injury    (Consider location/radiation/quality/duration/timing/severity/associated sxs/prior treatment) Patient is a 78 y.o. female presenting with fall and head injury. The history is provided by the patient and a relative.  Fall Associated symptoms include headaches. Pertinent negatives include no fever, no abdominal pain, no nausea and no vomiting.  Head Injury  Pertinent negatives include no vomiting and no weakness.  16 y female was making tea in kitchen.   Fainted.   Hit head.  She says everything became blurry before she fainted.   Denies pain, n/v/palpitations before fall.   Son came into room.  Said she appeared dazed but rapidly recovered.  No recent illness.   Takes plavix.   Has pacemaker.  Was seen by her cardiologist, Ladon Applebaum, recently.  Everything OK.  Only c/o ha now.  No recent change in meds.    Past Medical History  Diagnosis Date  . Hypertension   . CHF (congestive heart failure)   . A-fib   . Hypothyroid   . GERD (gastroesophageal reflux disease)   . Hypothyroidism   . Bradycardia   . Pancreatitis   . Coronary artery disease   . Shingles   . CORONARY ARTERY DISEASE 11/28/2006    Qualifier: Diagnosis of  By: Tawanna Cooler MD, Eugenio Hoes   . Pacemaker   . Paroxysmal a-fib, history of 08/02/2011  . Tremor, essential 08/02/2011  . Esophageal disorder, with history of dilatation 08/02/2011  . Angina   . H/O hiatal hernia   . Neuromuscular disorder     essential tremors  . UTI (lower urinary tract infection) 08/03/2011  . H/O cardiac pacemaker, Medtronic versa implanted 01/2008, though original implanted 2000 for syncope and heart block and ventricular asystole. 08/02/2011  . Dizziness, near syncope 11/07/2011  . HTN (hypertension), accelerated this admit 11/08/2011    Past  Surgical History  Procedure Date  . Permanent pacemaker   . S/p total hyserectomy and left oophorectomy   . S/p cholecystectomy   . Repair spigelian hernia   . S/p childbirth     x2  . S/p cataract extraction with lens implant   . S/p appendectomy   . Splint 2008  . Coronary angioplasty   . Insert / replace / remove pacemaker   . Cholecystectomy   . Appendectomy     No family history on file.  History  Substance Use Topics  . Smoking status: Never Smoker   . Smokeless tobacco: Never Used  . Alcohol Use: No    OB History    Grav Para Term Preterm Abortions TAB SAB Ect Mult Living                  Review of Systems  Constitutional: Negative for fever.  HENT: Negative for neck pain.   Eyes:       Blurred vision before syncope. Nl now.  Respiratory: Negative for cough, chest tightness and shortness of breath.   Cardiovascular: Negative for chest pain and palpitations.  Gastrointestinal: Negative for nausea, vomiting and abdominal pain.  Genitourinary: Negative for dysuria.  Musculoskeletal: Negative for back pain.  Skin: Negative for wound.  Neurological: Positive for syncope and headaches. Negative for weakness and light-headedness.  Hematological: Does not bruise/bleed easily.  Psychiatric/Behavioral: Negative for confusion.  All other systems reviewed and are negative.    Allergies  Codeine; Isosorbide mononitrate; Lorazepam; Prednisone; Valium; and Penicillins  Home Medications   Current Outpatient Rx  Name Route Sig Dispense Refill  . AMIODARONE HCL 200 MG PO TABS Oral Take 200 mg by mouth daily.      . ASPIRIN 81 MG PO TBEC Oral Take 81 mg by mouth daily.      [name] CALCIUM CARBONATE ANTACID 500 MG PO CHEW Oral Chew 1 tablet by mouth daily.     [name] CLOPIDOGREL BISULFATE 75 MG PO TABS Oral Take 75 mg by mouth daily.      . FUROSEMIDE 40 MG PO TABS Oral Take 40 mg by mouth daily.    [name] LEVOTHYROXINE SODIUM 75 MCG PO TABS Oral Take 75 mcg by mouth daily.    [name]  LORATADINE 10 MG PO TABS Oral Take 10 mg by mouth daily.      [name] LOSARTAN POTASSIUM 100 MG PO TABS Oral Take 150 mg by mouth daily.    [name] METOPROLOL TARTRATE 100 MG PO TABS Oral Take 100 mg by mouth 2 (two) times daily.    . MULTIVITAMINS PO CAPS Oral Take 1 capsule by mouth daily.      [name] PRESERVISION/LUTEIN PO CAPS Oral Take by mouth 2 (two) times daily.      [name] PANTOPRAZOLE SODIUM 40 MG PO TBEC Oral Take 40 mg by mouth 2 (two) times daily.    [name] POTASSIUM CHLORIDE CRYS ER 20 MEQ PO TBCR Oral Take 20 mEq by mouth daily.     [name] SIMVASTATIN 20 MG PO TABS Oral Take 20 mg by mouth every evening.    [name] TRAMADOL HCL 50 MG PO TABS Oral Take 25 mg by mouth at bedtime.     [name] NITROGLYCERIN 0.4 MG SL SUBL Sublingual Place 0.4 mg under the tongue every 5 (five) minutes as needed.        BP 152/75  Pulse 70  Temp 97.4 F (36.3 C) (Oral)  Resp 20  SpO2 96%  Physical Exam  Nursing note and vitals reviewed. Constitutional: She is oriented to person, place, and time. She appears well-developed and well-nourished. No distress.  HENT:       Small occipital hematoma. No blood.  Neck: Normal range of motion. Neck supple.  Cardiovascular: Normal rate, regular rhythm and intact distal pulses.   No murmur heard. Pulmonary/Chest: Breath sounds normal. No respiratory distress.  Abdominal: Soft. She exhibits no distension. There is no tenderness. There is no guarding.  Musculoskeletal: She exhibits no edema and no tenderness.  Neurological: She is alert and oriented to person, place, and time. No cranial nerve deficit.  Skin: Skin is warm and dry.  Psychiatric: She has a normal mood and affect. Thought content normal.    ED Course  Procedures (including critical care time)syncope in elderly female with pacemaker.   Hit head.  Gaylyn Rong now.  Nl ms and neuro exam. No recent illness. No signs acute illness. Will do ct since on plavix and elderly and hit head.  No signs stroke.   Will check labs and ecg.  Will  interrogate pacer.   Labs Reviewed  CBC WITH DIFFERENTIAL - Abnormal; Notable for the following:    WBC 10.8 (*)     Neutrophils Relative 85 (*)     Neutro Abs 9.2 (*)     Lymphocytes Relative 7 (*)     All other components within normal limits  BASIC METABOLIC PANEL -  Abnormal; Notable for the following:    CO2 33 (*)     Glucose, Bld 114 (*)     GFR calc non Af Amer 43 (*)     GFR calc Af Amer 50 (*)     All other components within normal limits  TROPONIN I  URINALYSIS, ROUTINE W REFLEX MICROSCOPIC   No results found.   No diagnosis found.  ECG Dual paced rhythm at 88 bpm 1 st degree av block Nl axis Nl sttw No significant change  Form Aug. 2013  4:54 PM Ha resolved.  i explained findings of ct to pt and son.  They understand.  6:34 PM Spoke with Dr. Darin Engels. He will admit.  MDM  Syncope uti        Cheri Guppy, MD 01/20/12 1518  Cheri Guppy, MD 01/20/12 [phone]

## 2012-01-21 ENCOUNTER — Encounter (HOSPITAL_COMMUNITY): Payer: Self-pay | Admitting: Cardiology

## 2012-01-21 DIAGNOSIS — N390 Urinary tract infection, site not specified: Principal | ICD-10-CM

## 2012-01-21 HISTORY — PX: TRANSTHORACIC ECHOCARDIOGRAM: SHX275

## 2012-01-21 LAB — COMPREHENSIVE METABOLIC PANEL
ALT: 17 U/L (ref 0–35)
AST: 23 U/L (ref 0–37)
Albumin: 3.3 g/dL — ABNORMAL LOW (ref 3.5–5.2)
Alkaline Phosphatase: 169 U/L — ABNORMAL HIGH (ref 39–117)
Calcium: 8.8 mg/dL (ref 8.4–10.5)
GFR calc Af Amer: 74 mL/min — ABNORMAL LOW (ref >90)
Potassium: 3.4 mEq/L — ABNORMAL LOW (ref 3.5–5.1)
Sodium: 141 mEq/L (ref 135–145)
Total Protein: 6.5 g/dL (ref 6.0–8.3)

## 2012-01-21 LAB — CK TOTAL AND CKMB (NOT AT ARMC)
CK, MB: 2.3 ng/mL (ref 0.3–4.0)
Relative Index: INVALID (ref 0.0–2.5)
Relative Index: INVALID (ref 0.0–2.5)
Relative Index: INVALID (ref 0.0–2.5)
Total CK: 73 U/L (ref 7–177)

## 2012-01-21 LAB — TROPONIN I: Troponin I: <0.3 ng/mL (ref <0.30)

## 2012-01-21 LAB — TSH: TSH: 2.178 u[IU]/mL (ref 0.350–4.500)

## 2012-01-21 LAB — HEMOGLOBIN A1C
Hgb A1c MFr Bld: 5.3 % (ref <5.7)
Mean Plasma Glucose: 105 mg/dL (ref <117)

## 2012-01-21 MED ORDER — POTASSIUM CHLORIDE CRYS ER 20 MEQ PO TBCR
40.0000 meq | EXTENDED_RELEASE_TABLET | Freq: Two times a day (BID) | ORAL | Status: AC
  Administered 2012-01-21 (×2): 40 meq via ORAL
  Filled 2012-01-21 (×2): qty 2

## 2012-01-21 NOTE — Consult Note (Signed)
Reason for Consult:syncope, eval for arrhythmia    Referring Physician: Dr. Adaline Sill [name] Luna is an 78 y.o. female.    Chief Complaint:  Dizzy then syncope  HPI: 78 year old female with past medical history of hypertensio,n coronary artery disease, hypertension, heart failure, hypothyroidism, status post pacemaker that comes in for syncope. She related she was in her usual health state when she suddenly felt dizzy and fell to the floor.  No other prodromal symptoms. Her son who was in the other room when writing and she was awake and able to answer simple questions. She did hit her head and still has knot on rt. Post. Skull.  She related no palpitation no shortness of breath no chest pain. She related no recent diarrhea nausea vomiting fevers or sick contacts.  Cardiac enzymes have been negative.  EKG with AV Pacing.    Hx of CAD With prior PCI with cutting balloon atherectomy and DES stenting of the LAD in 2008. In October 2011 she had 75% narrowing in her LAD stent and was restented with another drug-eluting Cypher stent. In June 2012 she was readmitted with chest pain recathed at that time which was revealing 70% in-stent restenosis in the LAD stent which was treated with a sandwich 3.8 DES resolute stent. September 2012 she was readmitted for chest pain cardiac cath at that time revealed patent stents. In May of this year she was admitted with chest pain negative EKG negative enzymes and she was set up for an outpatient Lexiscan Myoview which was negative for ischemia EF was normal and was felt to be a low risk study.  She has a history of paroxysmal atrial fibrillation and at one point was on Coumadin aspirin and Plavix but the Coumadin has been DC'd. She also has a permanent pacemaker a Medtronic Versa implanted in 2009 which was end-of-life change her initial pacemaker was 2000, implanted for syncope and heart block and ventricular asystole.   Other history does include chronic hoarseness  and speech impediment which is stable chest GI issues with a history of esophageal spasm and/or achalasia and a dilatation of her esophagus at one point-has seen GI and they felt her pain was chest wall pain.  + orthostatic BP drop 175/112 lying to 161/60 standing. + UTI as well.  Past Medical History  Diagnosis Date  . Hypertension   . CHF (congestive heart failure)   . A-fib   . Hypothyroid   . GERD (gastroesophageal reflux disease)   . Hypothyroidism   . Bradycardia   . Pancreatitis   . Coronary artery disease   . Shingles   . CORONARY ARTERY DISEASE 11/28/2006    Qualifier: Diagnosis of  By: Tawanna Cooler MD, Eugenio Hoes   . Pacemaker   . Paroxysmal a-fib, history of 08/02/2011  . Tremor, essential 08/02/2011  . Esophageal disorder, with history of dilatation 08/02/2011  . Angina   . H/O hiatal hernia   . Neuromuscular disorder     essential tremors  . UTI (lower urinary tract infection) 08/03/2011  . H/O cardiac pacemaker, Medtronic versa implanted 01/2008, though original implanted 2000 for syncope and heart block and ventricular asystole. 08/02/2011  . Dizziness, near syncope 11/07/2011  . HTN (hypertension), accelerated this admit 11/08/2011    Past Surgical History  Procedure Date  . Permanent pacemaker   . S/p total hyserectomy and left oophorectomy   . S/p cholecystectomy   . Repair spigelian hernia   . S/p childbirth     x2  .  S/p cataract extraction with lens implant   . S/p appendectomy   . Splint 2008  . Coronary angioplasty   . Insert / replace / remove pacemaker   . Cholecystectomy   . Appendectomy     Family History  Problem Relation Age of Onset  . Sudden death Mother    Social History:  reports that she has never smoked. She has never used smokeless tobacco. She reports that she does not drink alcohol or use illicit drugs.  Allergies:  Allergies  Allergen Reactions  . Codeine Other (See Comments)    Passes out  . Isosorbide Mononitrate     REACTION: fainted  .  Lorazepam Other (See Comments)    hallucinations  . Prednisone Other (See Comments)    nausea  . Valium (Diazepam)   . Penicillins Rash    Medications Prior to Admission  Medication Sig Dispense Refill  . amiodarone (PACERONE) 200 MG tablet Take 200 mg by mouth daily.        [name] aspirin 81 MG EC tablet Take 81 mg by mouth daily.        . calcium carbonate (TUMS - DOSED IN MG ELEMENTAL CALCIUM) 500 MG chewable tablet Chew 1 tablet by mouth daily.       . clopidogrel (PLAVIX) 75 MG tablet Take 75 mg by mouth daily.        . furosemide (LASIX) 40 MG tablet Take 40 mg by mouth daily.      [name] levothyroxine (SYNTHROID, LEVOTHROID) 75 MCG tablet Take 75 mcg by mouth daily.      [name] loratadine (CLARITIN) 10 MG tablet Take 10 mg by mouth daily.        [name] losartan (COZAAR) 100 MG tablet Take 150 mg by mouth daily.      . metoprolol (LOPRESSOR) 100 MG tablet Take 100 mg by mouth 2 (two) times daily.      . Multiple Vitamin (MULTIVITAMIN) capsule Take 1 capsule by mouth daily.        . Multiple Vitamins-Minerals (PRESERVISION/LUTEIN) CAPS Take by mouth 2 (two) times daily.        . pantoprazole (PROTONIX) 40 MG tablet Take 40 mg by mouth 2 (two) times daily.      . potassium chloride SA (K-DUR,KLOR-CON) 20 MEQ tablet Take 20 mEq by mouth daily.       . simvastatin (ZOCOR) 20 MG tablet Take 20 mg by mouth every evening.      . traMADol (ULTRAM) 50 MG tablet Take 25 mg by mouth at bedtime.       . nitroGLYCERIN (NITROSTAT) 0.4 MG SL tablet Place 0.4 mg under the tongue every 5 (five) minutes as needed.          Results for orders placed during the hospital encounter of 01/20/12 (from the past 48 hour(s))  CBC WITH DIFFERENTIAL     Status: Abnormal   Collection Time   01/20/12  1:01 PM      Component Value Range Comment   WBC 10.8 (*) 4.0 - 10.5 K/uL    RBC 4.14  3.87 - 5.11 MIL/uL    Hemoglobin 12.3  12.0 - 15.0 g/dL    HCT 16.1  09.6 - 04.5 %    MCV 92.3  78.0 - 100.0 fL    MCH 29.7  26.0 - 34.0  pg    MCHC 32.2  30.0 - 36.0 g/dL    RDW 40.9  81.1 - 91.4 %    Platelets 208  150 - 400 K/uL    Neutrophils Relative 85 (*) 43 - 77 %    Neutro Abs 9.2 (*) 1.7 - 7.7 K/uL    Lymphocytes Relative 7 (*) 12 - 46 %    Lymphs Abs 0.8  0.7 - 4.0 K/uL    Monocytes Relative 7  3 - 12 %    Monocytes Absolute 0.8  0.1 - 1.0 K/uL    Eosinophils Relative 1  0 - 5 %    Eosinophils Absolute 0.1  0.0 - 0.7 K/uL    Basophils Relative 0  0 - 1 %    Basophils Absolute 0.0  0.0 - 0.1 K/uL   BASIC METABOLIC PANEL     Status: Abnormal   Collection Time   01/20/12  1:01 PM      Component Value Range Comment   Sodium 141  135 - 145 mEq/L    Potassium 3.6  3.5 - 5.1 mEq/L    Chloride 100  96 - 112 mEq/L    CO2 33 (*) 19 - 32 mEq/L    Glucose, Bld 114 (*) 70 - 99 mg/dL    BUN 18  6 - 23 mg/dL    Creatinine, Ser 4.09  0.50 - 1.10 mg/dL    Calcium 9.5  8.4 - 81.1 mg/dL    GFR calc non Af Amer 43 (*) >90 mL/min    GFR calc Af Amer 50 (*) >90 mL/min   TROPONIN [name]     Status: Normal   Collection Time   01/20/12  2:47 PM      Component Value Range Comment   Troponin [name] <0.30  <0.30 ng/mL   URINALYSIS, ROUTINE W REFLEX MICROSCOPIC     Status: Abnormal   Collection Time   01/20/12  4:27 PM      Component Value Range Comment   Color, Urine YELLOW  YELLOW    APPearance CLOUDY (*) CLEAR    Specific Gravity, Urine 1.027  1.005 - 1.030    pH 5.5  5.0 - 8.0    Glucose, UA NEGATIVE  NEGATIVE mg/dL    Hgb urine dipstick NEGATIVE  NEGATIVE    Bilirubin Urine NEGATIVE  NEGATIVE    Ketones, ur NEGATIVE  NEGATIVE mg/dL    Protein, ur NEGATIVE  NEGATIVE mg/dL    Urobilinogen, UA 1.0  0.0 - 1.0 mg/dL    Nitrite POSITIVE (*) NEGATIVE    Leukocytes, UA SMALL (*) NEGATIVE   URINE MICROSCOPIC-ADD ON     Status: Abnormal   Collection Time   01/20/12  4:27 PM      Component Value Range Comment   Squamous Epithelial / LPF RARE  RARE    WBC, UA 3-6  <3 WBC/hpf    Bacteria, UA MANY (*) RARE    Casts GRANULAR CAST (*)  NEGATIVE   CBC     Status: Abnormal   Collection Time   01/20/12  9:57 PM      Component Value Range Comment   WBC 7.4  4.0 - 10.5 K/uL    RBC 3.83 (*) 3.87 - 5.11 MIL/uL    Hemoglobin 11.3 (*) 12.0 - 15.0 g/dL    HCT 91.4 (*) 78.2 - 46.0 %    MCV 91.4  78.0 - 100.0 fL    MCH 29.5  26.0 - 34.0 pg    MCHC 32.3  30.0 - 36.0 g/dL    RDW 95.6  21.3 - 08.6 %    Platelets 166  150 -  400 K/uL   CREATININE, SERUM     Status: Abnormal   Collection Time   01/20/12  9:57 PM      Component Value Range Comment   Creatinine, Ser 0.95  0.50 - 1.10 mg/dL    GFR calc non Af Amer 52 (*) >90 mL/min    GFR calc Af Amer 60 (*) >90 mL/min   MAGNESIUM     Status: Normal   Collection Time   01/20/12  9:57 PM      Component Value Range Comment   Magnesium 2.2  1.5 - 2.5 mg/dL   TROPONIN [name]     Status: Normal   Collection Time   01/20/12  9:57 PM      Component Value Range Comment   Troponin [name] <0.30  <0.30 ng/mL   TSH     Status: Normal   Collection Time   01/20/12  9:57 PM      Component Value Range Comment   TSH 2.178  0.350 - 4.500 uIU/mL   HEMOGLOBIN A1C     Status: Normal   Collection Time   01/20/12  9:57 PM      Component Value Range Comment   Hemoglobin A1C 5.3  <5.7 %    Mean Plasma Glucose 105  <117 mg/dL   CK TOTAL AND CKMB     Status: Normal   Collection Time   01/20/12  9:57 PM      Component Value Range Comment   Total CK 69  7 - 177 U/L    CK, MB 2.1  0.3 - 4.0 ng/mL    Relative Index RELATIVE INDEX IS INVALID  0.0 - 2.5   TROPONIN [name]     Status: Normal   Collection Time   01/21/12  3:42 AM      Component Value Range Comment   Troponin [name] <0.30  <0.30 ng/mL   COMPREHENSIVE METABOLIC PANEL     Status: Abnormal   Collection Time   01/21/12  3:42 AM      Component Value Range Comment   Sodium 141  135 - 145 mEq/L    Potassium 3.4 (*) 3.5 - 5.1 mEq/L    Chloride 104  96 - 112 mEq/L    CO2 29  19 - 32 mEq/L    Glucose, Bld 88  70 - 99 mg/dL    BUN 15  6 - 23 mg/dL     Creatinine, Ser 4.09  0.50 - 1.10 mg/dL    Calcium 8.8  8.4 - 81.1 mg/dL    Total Protein 6.5  6.0 - 8.3 g/dL    Albumin 3.3 (*) 3.5 - 5.2 g/dL    AST 23  0 - 37 U/L    ALT 17  0 - 35 U/L    Alkaline Phosphatase 169 (*) 39 - 117 U/L    Total Bilirubin 0.6  0.3 - 1.2 mg/dL    GFR calc non Af Amer 63 (*) >90 mL/min    GFR calc Af Amer 74 (*) >90 mL/min   PROTIME-INR     Status: Normal   Collection Time   01/21/12  3:42 AM      Component Value Range Comment   Prothrombin Time 13.1  11.6 - 15.2 seconds    INR 1.00  0.00 - 1.49   CK TOTAL AND CKMB     Status: Normal   Collection Time   01/21/12  3:42 AM      Component Value Range Comment  Total CK 73  7 - 177 U/L    CK, MB 2.3  0.3 - 4.0 ng/mL    Relative Index RELATIVE INDEX IS INVALID  0.0 - 2.5   TROPONIN [name]     Status: Normal   Collection Time   01/21/12  9:19 AM      Component Value Range Comment   Troponin [name] <0.30  <0.30 ng/mL    Ct Head Wo Contrast  01/20/2012  *RADIOLOGY REPORT*  Clinical Data: Dizziness and blurry vision  CT HEAD WITHOUT CONTRAST  Technique:  Contiguous axial images were obtained from the base of the skull through the vertex without contrast.  Comparison: 11/07/2011  Findings: There is diffuse patchy low density throughout the subcortical and periventricular white matter consistent with chronic small vessel ischemic change.  There is prominence of the sulci and ventricles consistent with brain atrophy.  There is no evidence for acute brain infarct, hemorrhage or mass.  There is a right posterior scalp hematoma which measures 7.3 mm in thickness, image 26.  The paranasal sinuses and mastoid air cells appear clear.  The skull appears intact.  IMPRESSION:  1.  No acute intracranial abnormalities. 2.  Small vessel ischemic disease and brain atrophy. 3.  Right posterior scalp hematoma.   Original Report Authenticated By: Rosealee Albee, M.D.    Dg Chest Port 1 View  01/21/2012  *RADIOLOGY REPORT*  Clinical Data:  Leukocytosis.  PORTABLE CHEST - 1 VIEW  Comparison: 11/07/2011  Findings: Heart size and appearance of pacemaker are stable.  There is atelectasis/chronic scarring at the right lung base.  No edema or focal consolidation is identified.  No pleural fluid identified.  IMPRESSION: Chronic atelectasis/scarring at the right lung base.  No acute findings.   Original Report Authenticated By: Reola Calkins, M.D.     ROS: General:no colds or fevers had been doing well Skin:+ bruising on arms, no rashes or ulcers HEENT:no blurred vision CV:no chest pain PUL:no SOB GI:no diarrhea constipation or melena GU:on 08/02/11 hospitalization she had UTI that was treated, no UTI 11/2011 but now returned MS:no joint pain Neuro:mild head discomfort since hitting her head Endo:no diabetes  No thyroid disease   Blood pressure 140/94, pulse 70, temperature 97.9 F (36.6 C), temperature source Oral, resp. rate 20, height 5\' 4"  (1.626 m), weight 64.638 kg (142 lb 8 oz), SpO2 94.00%. PE: General:alert and oriented, pleasant affect, looks good Skin:warm and dry, brisk capillary refill HEENT:+ swollen area rt post. Skull that per pt is smaller, + scalp hematoma by CT Neck:no JVD, no Bruits Heart:S1S2 RRR Lungs:clear without rales, rhonchi or wheezes Abd:+ BS, soft, non tender Ext:no edema Neuro:alert and oriented follows commands.MAE    Assessment/Plan Principal Problem:  *SYMPTOM, SYNCOPE AND COLLAPSE Active Problems:  CORONARY ARTERY DISEASE  H/O cardiac pacemaker, Medtronic versa implanted 01/2008, though original implanted 2000 for syncope and heart block and ventricular asystole.  Paroxysmal a-fib, history of, off coumadin since 10/2010, without recurrence of afib.  HYPOTHYROIDISM  HYPERTENSION  UTI (urinary tract infection)  Acute kidney injury  PLAN: Interrogated Medtronic pacer no  PAF.  Orthostatic hypotension on admit.  Negative MI, no arrhythmia, Echo is pending.   Possible syncope related to  orthostatic hypotension and UTI,  She has at least 2 per year of UTI,  ? Urology consult as outpt.  ? Vaginal estrogen cream and/or daily emperic antibiotics?    INGOLD,LAURA R 01/21/2012, 11:03 AM  [name] have seen and examined the patient along with Vernona Rieger  [name] Paras, NP.  [name] have reviewed the chart, notes and new data.  [name] agree with NP's note.  Pacemaker interrogation shows normal device function and no recent tachyarrhythmia. Events suggest orthostatic hypotension as cause of syncope. Her diuretic has been held and should be used sparingly as needed, rather than daily. UTI likely contributing to these events.  Thurmon Fair, MD, [facility] [phone] 01/21/2012, 2:06 PM

## 2012-01-21 NOTE — Progress Notes (Signed)
Physical Therapy Evaluation Patient Details Name: [name] Luna MRN: [id number] DOB: 17-Aug-1920 Today's Date: 01/21/2012 Time: 1478-2956 PT Time Calculation (min): 33 min  PT Assessment / Plan / Recommendation Clinical Impression  Pt is 78 yo female who presents after syncopal episode in her kitchen while she was fixing her tea, with laceration to posterior head.  Pt presents with generalized weakness and decreased functional mobility, she was able to ambulate within her house but unable to ambulate sufficient distances for mobility within the community.  Recommend acute PT to increase strength and mobility while hospitalized as well as HHPT at discharge. Encouraged pt to ambulate in hall 5x/ day with nsg or family while she is hospitalized. PT will follow.    PT Assessment  Patient needs continued PT services    Follow Up Recommendations  Home health PT;Supervision/Assistance - 24 hour    Does the patient have the potential to tolerate intense rehabilitation      Barriers to Discharge None      Equipment Recommendations  None recommended by PT    Recommendations for Other Services     Frequency Min 3X/week    Precautions / Restrictions Precautions Precautions: Fall Precaution Comments: pt had been up fixing food in the kitchen when she passed out, falling bkwds and hitting head.  She has had one other fall in the past several months that occurred when she was getting up to use the bathroom. Her son had to help her up. Restrictions Weight Bearing Restrictions: No   Pertinent Vitals/Pain No c/o pain      Mobility  Bed Mobility Bed Mobility: Not assessed (pt up in chair) Transfers Transfers: Sit to Stand;Stand to Sit Sit to Stand: 4: Min guard;From chair/3-in-1;With upper extremity assist Stand to Sit: To chair/3-in-1;With armrests;4: Min guard Details for Transfer Assistance: increased time and effort, took 2 attempts Ambulation/Gait Ambulation/Gait Assistance: 4:  Min guard Ambulation Distance (Feet): 200 Feet Assistive device: Rolling walker Ambulation/Gait Assistance Details: pt with decreased gait speed and gets nervous about being too far away from room, increasing fatigue with distance and decreasing speed Gait Pattern: Step-through pattern;Decreased stride length Gait velocity: decreased Stairs: No Wheelchair Mobility Wheelchair Mobility: No    Shoulder Instructions     Exercises Other Exercises Other Exercises: discussed ankle pumps before activity as pt has had orthostatic vitals since she has been here, also discussed small, frequent meals, and pausing after changes of position   PT Diagnosis: Generalized weakness  PT Problem List: Decreased strength;Decreased activity tolerance;Decreased mobility PT Treatment Interventions: DME instruction;Gait training;Stair training;Functional mobility training;Therapeutic activities;Therapeutic exercise;Balance training;Patient/family education   PT Goals Acute Rehab PT Goals PT Goal Formulation: With patient/family Time For Goal Achievement: 02/04/12 Potential to Achieve Goals: Good Pt will go Supine/Side to Sit: with modified independence PT Goal: Supine/Side to Sit - Progress: Goal set today Pt will go Sit to Supine/Side: with modified independence PT Goal: Sit to Supine/Side - Progress: Goal set today Pt will go Sit to Stand: with modified independence PT Goal: Sit to Stand - Progress: Goal set today Pt will go Stand to Sit: with modified independence PT Goal: Stand to Sit - Progress: Goal set today Pt will Ambulate: >150 feet;with modified independence;with least restrictive assistive device PT Goal: Ambulate - Progress: Goal set today Pt will Go Up / Down Stairs: 1-2 stairs;with rolling walker;with supervision PT Goal: Up/Down Stairs - Progress: Goal set today Pt will Perform Home Exercise Program: with supervision, verbal cues required/provided PT Goal: Perform Home Exercise  Program -  Progress: Goal set today  Visit Information  Last PT Received On: 01/21/12 Assistance Needed: +1    Subjective Data  Subjective: I think I could start using my cane again Patient Stated Goal: return home with son   Prior Functioning  Home Living Lives With: Son Available Help at Discharge: Family;Available 24 hours/day Type of Home: House Home Access: Stairs to enter [employer] of Steps: 1 (2x) Entrance Stairs-Rails: None Home Layout: One level Bathroom Shower/Tub: Engineer, manufacturing systems: Standard Bathroom Accessibility: Yes How Accessible: Accessible via walker Home Adaptive Equipment: Bedside commode/3-in-1;Walker - rolling;Straight cane (had tub bench and would not use it so it was sent back) Prior Function Level of Independence: Needs assistance Needs Assistance: Light Housekeeping;Meal Prep;Bathing Bath: Other (comment) (sponge baths only) Meal Prep: Minimal Light Housekeeping: Moderate Able to Take Stairs?: Yes Driving: No Vocation: Retired Musician: HOH;Expressive difficulties (due to issues with throat)    Cognition  Overall Cognitive Status: History of cognitive impairments - at baseline Arousal/Alertness: Awake/alert Orientation Level: Appears intact for tasks assessed Behavior During Session: [facility] for tasks performed Cognition - Other Comments: demonstrates some STM loss with recounting history and has difficulty with timeline, looks to son to fill in the blanks    Extremity/Trunk Assessment Right Upper Extremity Assessment RUE ROM/Strength/Tone: Deficits RUE ROM/Strength/Tone Deficits: generalized weakness noted x4 extremities, especially for prolonged contractions or increased repetition, such as is required for functional mobility RUE Sensation: WFL - Light Touch RUE Coordination: WFL - gross motor Left Upper Extremity Assessment LUE ROM/Strength/Tone: Deficits LUE ROM/Strength/Tone Deficits: same as RUE LUE  Sensation: WFL - Light Touch LUE Coordination: WFL - gross motor Right Lower Extremity Assessment RLE ROM/Strength/Tone: Deficits RLE ROM/Strength/Tone Deficits: knee ext 4/5, knee flex 4/5, hip flex 3+/5, difficulty with ptolonged contraction and multiple repetitions such as needed for independent functional mobility RLE Sensation: WFL - Light Touch RLE Coordination: WFL - gross motor Left Lower Extremity Assessment LLE ROM/Strength/Tone: Deficits LLE ROM/Strength/Tone Deficits: same as RLE LLE Sensation: WFL - Light Touch LLE Coordination: WFL - gross motor Trunk Assessment Trunk Assessment: Normal   Balance Balance Balance Assessed: Yes Dynamic Standing Balance Dynamic Standing - Balance Support: Bilateral upper extremity supported;During functional activity Dynamic Standing - Level of Assistance: 4: Min assist  End of Session PT - End of Session Equipment Utilized During Treatment: Gait belt Activity Tolerance: Patient tolerated treatment well;Patient limited by fatigue Patient left: in chair;with call bell/phone within reach;with family/visitor present Nurse Communication: Mobility status  GP   Lyanne Co, PT  Acute Rehab Services  [phone]   Lyanne Co 01/21/2012, 3:48 PM

## 2012-01-21 NOTE — Progress Notes (Signed)
  Echocardiogram 2D Echocardiogram has been performed.  [name] Luna 01/21/2012, 10:58 AM 

## 2012-01-21 NOTE — Progress Notes (Signed)
[facility] PROGRESS NOTE  Assessment/Plan: SYMPTOM, SYNCOPE AND COLLAPSE: -Orthostatic resolved. -KVO IV fluids -cardiac markers x3 negatice. -consulted cardiology, pacer interrogation.  UTI (urinary tract infection) (10/10/2010) - I agree with Rocephin check urine cultures.   HYPOTHYROIDISM (11/12/2006) -TSH 2.1  HYPERTENSION -stable.  - Acute kidney injury (01/20/2012) - resolved, with IV fluids.   Consultants:  cardiology  Procedures:  Pacemaker interogation  Antibiotics:  Rocephin 01/20/2012  HPI/Subjective: No complains.  Objective: Filed Vitals:   01/20/12 2013 01/20/12 2050 01/21/12 0600 01/21/12 1006  BP: 169/79 161/60  140/94  Pulse: 72 73 78 70  Temp:  98.2 F (36.8 C) 97.9 F (36.6 C)   TempSrc:      Resp:  15 20   Height:  5\' 4"  (1.626 m)    Weight:  64.638 kg (142 lb 8 oz)    SpO2:  99% 94%    No intake or output data in the 24 hours ending 01/21/12 1042 Filed Weights   01/20/12 2050  Weight: 64.638 kg (142 lb 8 oz)    Exam:  General: Alert, awake, oriented x3, in no acute distress.  HEENT: No bruits, no goiter.  Heart: Regular rate and rhythm, without murmurs, rubs, gallops.  Lungs: Good air movement, clear to auscultation. Abdomen: Soft, nontender, nondistended, positive bowel sounds.  Neuro: Grossly intact, nonfocal.   Data Reviewed: Basic Metabolic Panel:  Lab 01/21/12 1610 01/20/12 2157 01/20/12 1301  NA 141 -- 141  K 3.4* -- 3.6  CL 104 -- 100  CO2 29 -- 33*  GLUCOSE 88 -- 114*  BUN 15 -- 18  CREATININE 0.80 0.95 1.10  CALCIUM 8.8 -- 9.5  MG -- 2.2 --  PHOS -- -- --   Liver Function Tests:  Lab 01/21/12 0342  AST 23  ALT 17  ALKPHOS 169*  BILITOT 0.6  PROT 6.5  ALBUMIN 3.3*   No results found for this basename: LIPASE:5,AMYLASE:5 in the last 168 hours No results found for this basename: AMMONIA:5 in the last 168 hours CBC:  Lab 01/20/12 2157 01/20/12 1301  WBC 7.4 10.8*  NEUTROABS -- 9.2*  HGB  11.3* 12.3  HCT 35.0* 38.2  MCV 91.4 92.3  PLT 166 208   Cardiac Enzymes:  Lab 01/21/12 0919 01/21/12 0342 01/20/12 2157 01/20/12 1447  CKTOTAL -- 73 69 --  CKMB -- 2.3 2.1 --  CKMBINDEX -- -- -- --  TROPONINI <0.30 <0.30 <0.30 <0.30   BNP (last 3 results)  Basename 11/07/11 0956  PROBNP 384.4   CBG: No results found for this basename: GLUCAP:5 in the last 168 hours  No results found for this or any previous visit (from the past 240 hour(s)).   Studies: Ct Head Wo Contrast  01/20/2012  *RADIOLOGY REPORT*  Clinical Data: Dizziness and blurry vision  CT HEAD WITHOUT CONTRAST  Technique:  Contiguous axial images were obtained from the base of the skull through the vertex without contrast.  Comparison: 11/07/2011  Findings: There is diffuse patchy low density throughout the subcortical and periventricular white matter consistent with chronic small vessel ischemic change.  There is prominence of the sulci and ventricles consistent with brain atrophy.  There is no evidence for acute brain infarct, hemorrhage or mass.  There is a right posterior scalp hematoma which measures 7.3 mm in thickness, image 26.  The paranasal sinuses and mastoid air cells appear clear.  The skull appears intact.  IMPRESSION:  1.  No acute intracranial abnormalities. 2.  Small vessel ischemic disease  and brain atrophy. 3.  Right posterior scalp hematoma.   Original Report Authenticated By: Rosealee Albee, M.D.    Dg Chest Port 1 View  01/21/2012  *RADIOLOGY REPORT*  Clinical Data: Leukocytosis.  PORTABLE CHEST - 1 VIEW  Comparison: 11/07/2011  Findings: Heart size and appearance of pacemaker are stable.  There is atelectasis/chronic scarring at the right lung base.  No edema or focal consolidation is identified.  No pleural fluid identified.  IMPRESSION: Chronic atelectasis/scarring at the right lung base.  No acute findings.   Original Report Authenticated By: Reola Calkins, M.D.     Scheduled Meds:   .  sodium chloride   Intravenous Once  . sodium chloride   Intravenous STAT  . acetaminophen  650 mg Oral Once  . amiodarone  200 mg Oral Daily  . aspirin EC  81 mg Oral Daily  . calcium carbonate  1 tablet Oral Daily  . cefTRIAXone (ROCEPHIN) IVPB 1 gram/50 mL D5W  1 g Intravenous Once  . cefTRIAXone (ROCEPHIN)  IV  1 g Intravenous Q24H  . clopidogrel  75 mg Oral Daily  . heparin  5,000 Units Subcutaneous Q8H  . levothyroxine  75 mcg Oral Daily  . losartan  150 mg Oral Daily  . metoprolol  100 mg Oral BID  . pantoprazole  40 mg Oral BID  . potassium chloride  40 mEq Oral BID  . simvastatin  20 mg Oral QPM  . sodium chloride  3 mL Intravenous Q12H  . traMADol  25 mg Oral QHS  . DISCONTD: aspirin  81 mg Oral Daily   Continuous Infusions:    Marinda Elk  [facility] Pager [phone].  If 8PM-8AM, Please contact night-coverage at [email or web address], password [employer] 01/21/2012, 10:42 AM  LOS: 1 day

## 2012-01-21 NOTE — Progress Notes (Signed)
Pt arrived to telemetry unit with orders for DC tele. Paged [facility] on-call to clarify, Elray Mcgregor said to continue cardiac monitoring. Pt currently a-paced on the monitor. Will continue to monitor.  Harless Litten, RN 01/20/12

## 2012-01-22 DIAGNOSIS — Z950 Presence of cardiac pacemaker: Secondary | ICD-10-CM

## 2012-01-22 DIAGNOSIS — Z8679 Personal history of other diseases of the circulatory system: Secondary | ICD-10-CM

## 2012-01-22 DIAGNOSIS — R079 Chest pain, unspecified: Secondary | ICD-10-CM

## 2012-01-22 LAB — BASIC METABOLIC PANEL
CO2: 26 mEq/L (ref 19–32)
Calcium: 9.2 mg/dL (ref 8.4–10.5)
GFR calc non Af Amer: 73 mL/min — ABNORMAL LOW (ref >90)
Potassium: 4.1 mEq/L (ref 3.5–5.1)
Sodium: 141 mEq/L (ref 135–145)

## 2012-01-22 LAB — CK TOTAL AND CKMB (NOT AT ARMC)
CK, MB: 2.9 ng/mL (ref 0.3–4.0)
CK, MB: 3.1 ng/mL (ref 0.3–4.0)
Relative Index: INVALID (ref 0.0–2.5)
Relative Index: INVALID (ref 0.0–2.5)
Total CK: 88 U/L (ref 7–177)

## 2012-01-22 MED ORDER — HYDRALAZINE HCL 20 MG/ML IJ SOLN
10.0000 mg | Freq: Once | INTRAMUSCULAR | Status: AC
  Administered 2012-01-22: 10 mg via INTRAVENOUS
  Filled 2012-01-22: qty 1

## 2012-01-22 MED ORDER — CIPROFLOXACIN HCL 500 MG PO TABS
500.0000 mg | ORAL_TABLET | Freq: Two times a day (BID) | ORAL | Status: DC
  Administered 2012-01-22 – 2012-01-25 (×7): 500 mg via ORAL
  Filled 2012-01-22 (×9): qty 1

## 2012-01-22 MED ORDER — FUROSEMIDE 40 MG PO TABS
40.0000 mg | ORAL_TABLET | Freq: Every day | ORAL | Status: DC
  Administered 2012-01-22: 40 mg via ORAL
  Filled 2012-01-22: qty 1

## 2012-01-22 MED ORDER — FUROSEMIDE 40 MG PO TABS
40.0000 mg | ORAL_TABLET | ORAL | Status: DC
  Filled 2012-01-22: qty 1

## 2012-01-22 NOTE — ED Provider Notes (Signed)
Medical screening examination/treatment/procedure(s) were performed by non-physician practitioner and as supervising physician I was immediately available for consultation/collaboration.  Tobin Chad, MD 01/22/12 [phone]

## 2012-01-22 NOTE — Progress Notes (Signed)
UR Completed Keenan Trefry Graves-Bigelow, RN,BSN [phone]  

## 2012-01-22 NOTE — Progress Notes (Signed)
Pt complained of pressure on the left side of her chest. BP 221/96 HR 80 a-paced on the monitor. 2L 02 applied. EKG obtained, showing 1st degree HB. Two sublingual NTG given. New BP 166/75. MD on call paged and new orders given for IV Hydralazine 10mg  once. Pt reports chest pressure relieved. Will continue to monitor.  Harless Litten, RN 01/21/12

## 2012-01-22 NOTE — Progress Notes (Signed)
Physical Therapy Treatment Patient Details Name: [name] Luna MRN: [id number] DOB: 02-Feb-1921 Today's Date: 01/22/2012 Time: 7829-5621 PT Time Calculation (min): 12 min  PT Assessment / Plan / Recommendation Comments on Treatment Session  pt presents with UTI and Syncope.  pt c/o fatigue today, but agreeable to ambulate and son present to encourage ambulation.  pt making progress.      Follow Up Recommendations  Home health PT;Supervision/Assistance - 24 hour     Does the patient have the potential to tolerate intense rehabilitation     Barriers to Discharge        Equipment Recommendations  None recommended by PT    Recommendations for Other Services    Frequency Min 3X/week   Plan Discharge plan remains appropriate;Frequency remains appropriate    Precautions / Restrictions Precautions Precautions: Fall Restrictions Weight Bearing Restrictions: No   Pertinent Vitals/Pain Denies pain.      Mobility  Bed Mobility Bed Mobility: Supine to Sit;Sitting - Scoot to Edge of Bed Supine to Sit: 5: Supervision;With rails Sitting - Scoot to Edge of Bed: 5: Supervision Details for Bed Mobility Assistance: Increased time needed to complete without A.   Transfers Transfers: Sit to Stand;Stand to Sit Sit to Stand: 4: Min guard;With upper extremity assist;From bed Stand to Sit: 4: Min guard;With upper extremity assist;To chair/3-in-1;With armrests Details for Transfer Assistance: cues to get closer to chair prior to sitting.   Ambulation/Gait Ambulation/Gait Assistance: 4: Min guard Ambulation Distance (Feet): 200 Feet Assistive device: Rolling walker Ambulation/Gait Assistance Details: pt with improved confidence today during gait, though still maintains a slow gait speed, and even slower when negotiating obstacles or incorperating head turns.  pt with difficulty finding room on way back.   Gait Pattern: Step-through pattern;Decreased stride length Stairs: No Wheelchair  Mobility Wheelchair Mobility: No    Exercises     PT Diagnosis:    PT Problem List:   PT Treatment Interventions:     PT Goals Acute Rehab PT Goals Time For Goal Achievement: 02/04/12 PT Goal: Supine/Side to Sit - Progress: Progressing toward goal PT Goal: Sit to Stand - Progress: Progressing toward goal PT Goal: Stand to Sit - Progress: Progressing toward goal PT Goal: Ambulate - Progress: Progressing toward goal  Visit Information  Last PT Received On: 01/22/12 Assistance Needed: +1    Subjective Data  Subjective: I'm pretty tired, but I guess I could walk again.     Cognition  Overall Cognitive Status: History of cognitive impairments - at baseline Arousal/Alertness: Awake/alert Orientation Level: Appears intact for tasks assessed Behavior During Session: [facility] for tasks performed Cognition - Other Comments: demonstrates some STM loss with recounting history and has difficulty with timeline, looks to son to fill in the blanks    Balance  Balance Balance Assessed: No  End of Session PT - End of Session Equipment Utilized During Treatment: Gait belt Activity Tolerance: Patient tolerated treatment well Patient left: in chair;with call bell/phone within reach;with family/visitor present Nurse Communication: Mobility status   GP     Sunny Schlein, [place] [phone] 01/22/2012, 2:59 PM

## 2012-01-22 NOTE — Progress Notes (Signed)
Clinical Social Worker received referral indicating pt is interested in [place] paperwork.  CSW met with pt who stated she does not want information on HCPOA as she currently has one.  CSW not to sign on, please re consult if needed.   Angelia Mould, MSW, [place] [phone]

## 2012-01-22 NOTE — Care Management Note (Signed)
    Page 1 of 1   01/22/2012     2:58:20 PM   CARE MANAGEMENT NOTE 01/22/2012  Patient:  [name] Luna, [name] Luna   Account Number:  [id number]  Date Initiated:  01/22/2012  Documentation initiated by:  GRAVES-BIGELOW,Nickolus Wadding  Subjective/Objective Assessment:   Pt admitted with syncope. Pt has hx of CAD. Pt is at home with son and son provides the 24 hr supervision.     Action/Plan:   CM did offer choice for hh services of PT and they are agreeable to services. MD please write order for HHPT.   Anticipated DC Date:  01/24/2012   Anticipated DC Plan:  HOME W HOME HEALTH SERVICES      DC Planning Services  CM consult      [facility] Choice  HOME HEALTH   Choice offered to / List presented to:  C-1 Patient        HH arranged  HH-2 PT      [facility] agency  [facility]   Status of service:  Completed, signed off Medicare Important Message given?   (If response is "NO", the following Medicare IM given date fields will be blank) Date Medicare IM given:   Date Additional Medicare IM given:    Discharge Disposition:  HOME W HOME HEALTH SERVICES  Per UR Regulation:  Reviewed for med. necessity/level of care/duration of stay  If discussed at Long Length of Stay Meetings, dates discussed:    Comments:

## 2012-01-22 NOTE — Progress Notes (Signed)
Subjective:   Objective: Vital signs in last 24 hours: Temp:  [97.2 F (36.2 C)-98.2 F (36.8 C)] 97.2 F (36.2 C) (10/22 0526) Pulse Rate:  [70-89] 73  (10/22 0931) Resp:  [16-18] 16  (10/22 0526) BP: (138-221)/(70-100) 138/70 mmHg (10/22 0931) SpO2:  [90 %-97 %] 90 % (10/22 0526) Weight:  [66.86 kg (147 lb 6.4 oz)] 66.86 kg (147 lb 6.4 oz) (10/22 0526) Weight change: 2.223 kg (4 lb 14.4 oz) Last BM Date: 01/20/12 Intake/Output from previous day: no documentation   Intake/Output this shift:    PE: General:no complaints, walking in the hall Heart:S1S2 RRR Lungs:clear Abd:+ BS Ext:no edema    Lab Results:  [facility] 01/20/12 2157 01/20/12 1301  WBC 7.4 10.8*  HGB 11.3* 12.3  HCT 35.0* 38.2  PLT 166 208   BMET  Basename 01/22/12 0630 01/21/12 0342  NA 141 141  K 4.1 3.4*  CL 105 104  CO2 26 29  GLUCOSE 99 88  BUN 11 15  CREATININE 0.75 0.80  CALCIUM 9.2 8.8    Basename 01/21/12 0919 01/21/12 0342  TROPONINI <0.30 <0.30    Lab Results  Component Value Date   CHOL 150 11/08/2011   HDL 61 11/08/2011   LDLCALC 76 11/08/2011   TRIG 65 11/08/2011   CHOLHDL 2.5 11/08/2011   Lab Results  Component Value Date   HGBA1C 5.3 01/20/2012     Lab Results  Component Value Date   TSH 2.178 01/20/2012    Hepatic Function Panel  Basename 01/21/12 0342  PROT 6.5  ALBUMIN 3.3*  AST 23  ALT 17  ALKPHOS 169*  BILITOT 0.6  BILIDIR --  IBILI --   No results found for this basename: CHOL in the last 72 hours No results found for this basename: PROTIME in the last 72 hours    EKG: Orders placed during the hospital encounter of 01/20/12  . ED EKG  . ED EKG  . EKG 12-LEAD  . EKG 12-LEAD  . EKG 12-LEAD  . EKG 12-LEAD    Studies/Results: Ct Head Wo Contrast  01/20/2012  *RADIOLOGY REPORT*  Clinical Data: Dizziness and blurry vision  CT HEAD WITHOUT CONTRAST  Technique:  Contiguous axial images were obtained from the base of the skull through the vertex without  contrast.  Comparison: 11/07/2011  Findings: There is diffuse patchy low density throughout the subcortical and periventricular white matter consistent with chronic small vessel ischemic change.  There is prominence of the sulci and ventricles consistent with brain atrophy.  There is no evidence for acute brain infarct, hemorrhage or mass.  There is a right posterior scalp hematoma which measures 7.3 mm in thickness, image 26.  The paranasal sinuses and mastoid air cells appear clear.  The skull appears intact.  IMPRESSION:  1.  No acute intracranial abnormalities. 2.  Small vessel ischemic disease and brain atrophy. 3.  Right posterior scalp hematoma.   Original Report Authenticated By: Rosealee Albee, M.D.    Dg Chest Port 1 View  01/21/2012  *RADIOLOGY REPORT*  Clinical Data: Leukocytosis.  PORTABLE CHEST - 1 VIEW  Comparison: 11/07/2011  Findings: Heart size and appearance of pacemaker are stable.  There is atelectasis/chronic scarring at the right lung base.  No edema or focal consolidation is identified.  No pleural fluid identified.  IMPRESSION: Chronic atelectasis/scarring at the right lung base.  No acute findings.   Original Report Authenticated By: Reola Calkins, M.D.     Medications: I have reviewed the  patient's current medications.    [name] amiodarone  200 mg Oral Daily  . aspirin EC  81 mg Oral Daily  . calcium carbonate  1 tablet Oral Daily  . ciprofloxacin  500 mg Oral BID  . clopidogrel  75 mg Oral Daily  . furosemide  40 mg Oral QODAY  . heparin  5,000 Units Subcutaneous Q8H  . hydrALAZINE  10 mg Intravenous Once  . levothyroxine  75 mcg Oral Daily  . losartan  150 mg Oral Daily  . metoprolol  100 mg Oral BID  . pantoprazole  40 mg Oral BID  . potassium chloride  40 mEq Oral BID  . simvastatin  20 mg Oral QPM  . sodium chloride  3 mL Intravenous Q12H  . traMADol  25 mg Oral QHS  . DISCONTD: cefTRIAXone (ROCEPHIN)  IV  1 g Intravenous Q24H  . DISCONTD: furosemide  40 mg  Oral Daily   Assessment/Plan: Principal Problem:  *SYMPTOM, SYNCOPE AND COLLAPSE Active Problems:  CORONARY ARTERY DISEASE  H/O cardiac pacemaker, [employer] versa implanted 01/2008, though original implanted 2000 for syncope and heart block and ventricular asystole.  Paroxysmal a-fib, history of, off coumadin since 10/2010, without recurrence of afib.  HYPOTHYROIDISM  HYPERTENSION  UTI (urinary tract infection)  Acute kidney injury   PLAN:  Chest pain last pm with HTN, NTG and IV hydralizine given with decrease of BP and relief of pain.  BP elevated this am-- is on cozaar 150 mg daily, lasix 40 daily, lopressor 100 mg BID ? Add low dose of norvasc  Now walking in hallway without complications.  No pain, no SOB, no dizziness.  Scalp hematoma still uncomfortable but resolving, CT head without scalp hematoma only   EKG without acute changes. Pacer interrogated yesterday without arrhythmias.  MD to see-- appoint made with Dr. Alanda Amass     LOS: 2 days   [name] Luna,LAURA R 01/22/2012, 10:32 AM  I seen and examined the patient along with [facility] R, NP.  I agree with her findings, examination as well as assessment/recommendations.  No prolapses into the period she did have chest pain last night with hypertension. However she is tender intolerant and prone to orthostatic hypotension. I be reluctant to use calcium channel blocker in this situation but would opt for when necessary sublingual nitroglycerin glycerin. She does get anginal symptoms would consider ranolazine as this does not affect the blood pressure.  Agree with every other day Lasix for now.  No arrhythmia noted on pacer.  Disposition he should be ready for discharge tomorrow as per [facility] service.  Marykay Lex, M.D., M.S. [facility] [address] [place], [place]  [address]  [phone] Pager # [phone] 01/22/2012 3:58 PM

## 2012-01-22 NOTE — Progress Notes (Signed)
[facility] PROGRESS NOTE  Assessment/Plan: SYMPTOM, SYNCOPE AND COLLAPSE: -Orthostatic resolved. -KVO IV fluids -cardiac markers x3 negative. -consulted cardiology, Pacemaker interrogation shows normal device function and no recent tachyarrhythmia -home tomorrow if remain es asymptomatic and afebrile.  UTI (urinary tract infection) (10/10/2010) - I agree with Rocephin, urine cuture not done. -change to cipro 01/22/2012  HYPOTHYROIDISM (11/12/2006) -TSH 2.1  HYPERTENSION -stable.  - Acute kidney injury (01/20/2012) - resolved, with IV fluids. -lasix every other day., agree with cards   Consultants:  cardiology  Procedures:  Pacemaker interrogation  Echo: no wall motion EF 60% grade 2 diastolic dysfunction  Antibiotics:  Rocephin 01/20/2012  cipro 01/22/2012  HPI/Subjective: No complains.  Objective: Filed Vitals:   01/22/12 0035 01/22/12 0526 01/22/12 0615 01/22/12 0931  BP: 166/75 190/84 176/82 138/70  Pulse:  89  73  Temp:  97.2 F (36.2 C)    TempSrc:  Oral    Resp: 16 16    Height:      Weight:  66.86 kg (147 lb 6.4 oz)    SpO2:  90%     No intake or output data in the 24 hours ending 01/22/12 1029 Filed Weights   01/20/12 2050 01/22/12 0526  Weight: 64.638 kg (142 lb 8 oz) 66.86 kg (147 lb 6.4 oz)    Exam:  General: Alert, awake, oriented x3, in no acute distress.  HEENT: No bruits, no goiter.  Heart: Regular rate and rhythm, without murmurs, rubs, gallops.  Lungs: Good air movement, clear to auscultation. Abdomen: Soft, nontender, nondistended, positive bowel sounds.  Neuro: Grossly intact, nonfocal.   Data Reviewed: Basic Metabolic Panel:  Lab 01/22/12 1610 01/21/12 0342 01/20/12 2157 01/20/12 1301  NA 141 141 -- 141  K 4.1 3.4* -- 3.6  CL 105 104 -- 100  CO2 26 29 -- 33*  GLUCOSE 99 88 -- 114*  BUN 11 15 -- 18  CREATININE 0.75 0.80 0.95 1.10  CALCIUM 9.2 8.8 -- 9.5  MG -- -- 2.2 --  PHOS -- -- -- --   Liver Function  Tests:  Lab 01/21/12 0342  AST 23  ALT 17  ALKPHOS 169*  BILITOT 0.6  PROT 6.5  ALBUMIN 3.3*   No results found for this basename: LIPASE:5,AMYLASE:5 in the last 168 hours No results found for this basename: AMMONIA:5 in the last 168 hours CBC:  Lab 01/20/12 2157 01/20/12 1301  WBC 7.4 10.8*  NEUTROABS -- 9.2*  HGB 11.3* 12.3  HCT 35.0* 38.2  MCV 91.4 92.3  PLT 166 208   Cardiac Enzymes:  Lab 01/22/12 0630 01/21/12 2104 01/21/12 1319 01/21/12 0919 01/21/12 0342 01/20/12 2157 01/20/12 1447  CKTOTAL 88 91 86 -- 73 69 --  CKMB 2.9 2.9 2.7 -- 2.3 2.1 --  CKMBINDEX -- -- -- -- -- -- --  TROPONINI -- -- -- <0.30 <0.30 <0.30 <0.30   BNP (last 3 results)  Basename 11/07/11 0956  PROBNP 384.4   CBG: No results found for this basename: GLUCAP:5 in the last 168 hours  No results found for this or any previous visit (from the past 240 hour(s)).   Studies: Ct Head Wo Contrast  01/20/2012  *RADIOLOGY REPORT*  Clinical Data: Dizziness and blurry vision  CT HEAD WITHOUT CONTRAST  Technique:  Contiguous axial images were obtained from the base of the skull through the vertex without contrast.  Comparison: 11/07/2011  Findings: There is diffuse patchy low density throughout the subcortical and periventricular white matter consistent with chronic small vessel  ischemic change.  There is prominence of the sulci and ventricles consistent with brain atrophy.  There is no evidence for acute brain infarct, hemorrhage or mass.  There is a right posterior scalp hematoma which measures 7.3 mm in thickness, image 26.  The paranasal sinuses and mastoid air cells appear clear.  The skull appears intact.  IMPRESSION:  1.  No acute intracranial abnormalities. 2.  Small vessel ischemic disease and brain atrophy. 3.  Right posterior scalp hematoma.   Original Report Authenticated By: Rosealee Albee, M.D.    Dg Chest Port 1 View  01/21/2012  *RADIOLOGY REPORT*  Clinical Data: Leukocytosis.  PORTABLE  CHEST - 1 VIEW  Comparison: 11/07/2011  Findings: Heart size and appearance of pacemaker are stable.  There is atelectasis/chronic scarring at the right lung base.  No edema or focal consolidation is identified.  No pleural fluid identified.  IMPRESSION: Chronic atelectasis/scarring at the right lung base.  No acute findings.   Original Report Authenticated By: Reola Calkins, M.D.     Scheduled Meds:    . amiodarone  200 mg Oral Daily  . aspirin EC  81 mg Oral Daily  . calcium carbonate  1 tablet Oral Daily  . cefTRIAXone (ROCEPHIN)  IV  1 g Intravenous Q24H  . clopidogrel  75 mg Oral Daily  . furosemide  40 mg Oral QODAY  . heparin  5,000 Units Subcutaneous Q8H  . hydrALAZINE  10 mg Intravenous Once  . levothyroxine  75 mcg Oral Daily  . losartan  150 mg Oral Daily  . metoprolol  100 mg Oral BID  . pantoprazole  40 mg Oral BID  . potassium chloride  40 mEq Oral BID  . simvastatin  20 mg Oral QPM  . sodium chloride  3 mL Intravenous Q12H  . traMADol  25 mg Oral QHS  . DISCONTD: furosemide  40 mg Oral Daily   Continuous Infusions:    Marinda Elk  [facility] Pager [phone].  If 8PM-8AM, Please contact night-coverage at [email or web address], password [email or web address] 01/22/2012, 10:29 AM  LOS: 2 days

## 2012-01-23 ENCOUNTER — Inpatient Hospital Stay (HOSPITAL_COMMUNITY): Payer: Medicare Other

## 2012-01-23 DIAGNOSIS — K219 Gastro-esophageal reflux disease without esophagitis: Secondary | ICD-10-CM

## 2012-01-23 DIAGNOSIS — R42 Dizziness and giddiness: Secondary | ICD-10-CM | POA: Diagnosis present

## 2012-01-23 DIAGNOSIS — D649 Anemia, unspecified: Secondary | ICD-10-CM

## 2012-01-23 DIAGNOSIS — G252 Other specified forms of tremor: Secondary | ICD-10-CM

## 2012-01-23 DIAGNOSIS — I4891 Unspecified atrial fibrillation: Secondary | ICD-10-CM

## 2012-01-23 LAB — TROPONIN I: Troponin I: <0.3 ng/mL (ref <0.30)

## 2012-01-23 LAB — CK TOTAL AND CKMB (NOT AT ARMC)
Relative Index: INVALID (ref 0.0–2.5)
Total CK: 78 U/L (ref 7–177)
Total CK: 72 U/L (ref 7–177)

## 2012-01-23 MED ORDER — MECLIZINE HCL 25 MG PO TABS
25.0000 mg | ORAL_TABLET | Freq: Two times a day (BID) | ORAL | Status: DC
  Administered 2012-01-23 – 2012-01-26 (×7): 25 mg via ORAL
  Filled 2012-01-23 (×8): qty 1

## 2012-01-23 MED ORDER — FUROSEMIDE 40 MG PO TABS
40.0000 mg | ORAL_TABLET | ORAL | Status: DC
  Administered 2012-01-24: 40 mg via ORAL
  Filled 2012-01-23: qty 1

## 2012-01-23 MED ORDER — RANOLAZINE ER 500 MG PO TB12
500.0000 mg | ORAL_TABLET | Freq: Two times a day (BID) | ORAL | Status: DC
  Administered 2012-01-23 – 2012-01-26 (×7): 500 mg via ORAL
  Filled 2012-01-23 (×8): qty 1

## 2012-01-23 NOTE — Progress Notes (Signed)
Patient ID: LEGACY CARRENDER  female  WUJ:[id number]    DOB: [birth year]    DOA: 01/20/2012  PCP: Evette Georges, MD  Subjective: Overnight events noted chest pain resolved. Apparently patient was having chest pain with ?dizziness, patient received 2 nitroglycerin which dropped her BP but chest pain was improved. Upon further questioning, she states that she has a "room spinning" sensation with nausea, Dix-Hallpike positive for vertigo  Objective: Weight change: -1.179 kg (-2 lb 9.6 oz) No intake or output data in the 24 hours ending 01/23/12 1452 Blood pressure 120/73, pulse 70, temperature 97.8 F (36.6 C), temperature source Oral, resp. rate 18, height 5\' 4"  (1.626 m), weight 65.681 kg (144 lb 12.8 oz), SpO2 97.00%.  Physical Exam: General: Alert and awake, oriented x3, not in any acute distress. HEENT: anicteric sclera, pupils reactive to light and accommodation, EOMI, DIx Hallpike + CVS: S1-S2 clear, no murmur rubs or gallops Chest: clear to auscultation bilaterally, no wheezing, rales or rhonchi Abdomen: soft nontender, nondistended, normal bowel sounds, no organomegaly Extremities: no cyanosis, clubbing or edema noted bilaterally, tremors Neuro: Cranial nerves II-XII intact, no focal neurological deficits Skin : Extensive bruising, petechiae and hematoma on arms and back of head  Lab Results: Basic Metabolic Panel:  Lab 01/22/12 9562 01/21/12 0342 01/20/12 2157  NA 141 141 --  K 4.1 3.4* --  CL 105 104 --  CO2 26 29 --  GLUCOSE 99 88 --  BUN 11 15 --  CREATININE 0.75 0.80 --  CALCIUM 9.2 8.8 --  MG -- -- 2.2  PHOS -- -- --   Liver Function Tests:  Lab 01/21/12 0342  AST 23  ALT 17  ALKPHOS 169*  BILITOT 0.6  PROT 6.5  ALBUMIN 3.3*   No results found for this basename: LIPASE:2,AMYLASE:2 in the last 168 hours No results found for this basename: AMMONIA:2 in the last 168 hours CBC:  Lab 01/20/12 2157 01/20/12 1301  WBC 7.4 10.8*  NEUTROABS -- 9.2*  HGB  11.3* 12.3  HCT 35.0* 38.2  MCV 91.4 92.3  PLT 166 208   Cardiac Enzymes:  Lab 01/23/12 0906 01/23/12 0200 01/22/12 2135 01/21/12 0919  CKTOTAL 78 84 99 --  CKMB 2.6 2.8 3.3 --  CKMBINDEX -- -- -- --  TROPONINI <0.30 <0.30 -- <0.30   BNP: No components found with this basename: POCBNP:2 CBG: No results found for this basename: GLUCAP:5 in the last 168 hours   Micro Results: No results found for this or any previous visit (from the past 240 hour(s)).  Studies/Results: Ct Head Wo Contrast  01/23/2012  *RADIOLOGY REPORT*  Clinical Data: 78 year old female status post fall.  Vertigo and pain.  CT HEAD WITHOUT CONTRAST  Technique:  Contiguous axial images were obtained from the base of the skull through the vertex without contrast.  Comparison: 11/20/2011 and earlier.  Findings: Mildly decreased right superior convexity superficial scalp hematoma.  Underlying calvarium remains intact.  There is new broad-based left posterior lateral scalp soft tissue swelling compatible with a second scalp hematoma (series 3 image 39). Calvarium on this side also appears intact.  No acute orbit soft tissue findings. Calcified atherosclerosis at the skull base.  Visualized paranasal sinuses and mastoids are clear.  Stable cerebral volume.  No ventriculomegaly. No midline shift, mass effect, or evidence of mass lesion.  Bilateral patchy and confluent white matter hypodensity is not significantly changed. No acute intracranial hemorrhage identified.  No evidence of cortically based acute infarction identified.  IMPRESSION: 1.  Bilateral posterior convexity scalp hematomas without underlying fracture. 2. No acute intracranial abnormality.   Original Report Authenticated By: Harley Hallmark, M.D.    Ct Head Wo Contrast  01/20/2012  *RADIOLOGY REPORT*  Clinical Data: Dizziness and blurry vision  CT HEAD WITHOUT CONTRAST  Technique:  Contiguous axial images were obtained from the base of the skull through the vertex  without contrast.  Comparison: 11/07/2011  Findings: There is diffuse patchy low density throughout the subcortical and periventricular white matter consistent with chronic small vessel ischemic change.  There is prominence of the sulci and ventricles consistent with brain atrophy.  There is no evidence for acute brain infarct, hemorrhage or mass.  There is a right posterior scalp hematoma which measures 7.3 mm in thickness, image 26.  The paranasal sinuses and mastoid air cells appear clear.  The skull appears intact.  IMPRESSION:  1.  No acute intracranial abnormalities. 2.  Small vessel ischemic disease and brain atrophy. 3.  Right posterior scalp hematoma.   Original Report Authenticated By: Rosealee Albee, M.D.    Dg Chest Port 1 View  01/21/2012  *RADIOLOGY REPORT*  Clinical Data: Leukocytosis.  PORTABLE CHEST - 1 VIEW  Comparison: 11/07/2011  Findings: Heart size and appearance of pacemaker are stable.  There is atelectasis/chronic scarring at the right lung base.  No edema or focal consolidation is identified.  No pleural fluid identified.  IMPRESSION: Chronic atelectasis/scarring at the right lung base.  No acute findings.   Original Report Authenticated By: Reola Calkins, M.D.     Medications: Scheduled Meds:   . amiodarone  200 mg Oral Daily  . aspirin EC  81 mg Oral Daily  . calcium carbonate  1 tablet Oral Daily  . ciprofloxacin  500 mg Oral BID  . clopidogrel  75 mg Oral Daily  . furosemide  40 mg Oral QODAY  . heparin  5,000 Units Subcutaneous Q8H  . levothyroxine  75 mcg Oral Daily  . losartan  150 mg Oral Daily  . meclizine  25 mg Oral BID  . metoprolol  100 mg Oral BID  . pantoprazole  40 mg Oral BID  . ranolazine  500 mg Oral BID  . simvastatin  20 mg Oral QPM  . sodium chloride  3 mL Intravenous Q12H  . traMADol  25 mg Oral QHS  . DISCONTD: furosemide  40 mg Oral QODAY   Continuous Infusions:    Assessment/Plan: Principal Problem:  *SYMPTOM, SYNCOPE AND  COLLAPSE: Possibly secondary to orthostatic hypotension, UTI versus vertigo  - Per cardiology notes, known to orthostatic hypotension, was recommended sublingual nitroglycerin for chest pain/anginal symptoms however she became hypotensive with NTG.    Vertigo: Given worsening of her symptoms last night I ordered a CT head unable to do MRI secondary to pacemaker - CT head repeat shows no acute stroke, new second scalp hematoma - I started her on scheduled meclizine today - PT/OT vestibular evaluation  Coronary artery sees with chest pain - Per cardiology, she has chronic anginal symptoms. I discussed with patient's son in detail who states that she was on ranolazine which did improve her anginal symptoms but was DC'ed by her cardiologist. I restarted it as it will not affect the BP and was rec'd by Dr Herbie Baltimore on 10/22.    Active Problems:  HYPOTHYROIDISM: TSH normal - Continue levothyroxin   HYPERTENSION: Somewhat uncontrolled   UTI (urinary tract infection): on cipro, no Urine culture available?   H/O cardiac pacemaker, [employer]  versa implanted 01/2008, though original implanted 2000 for syncope and heart block and ventricular asystole.   Paroxysmal a-fib, history of, off coumadin since 10/2010, without recurrence of afib.   Acute kidney injury: resolved  Tremors:  - Neuro eval is requested  DVT Prophylaxis: SCD's  Code Status: Full Code  Disposition: hopefully DC tomorrow   LOS: 3 days   RAI,RIPUDEEP M.D. [facility] 01/23/2012, 2:52 PM Pager: [phone]  If 7PM-7AM, please contact night-coverage [email or web address] Password [email or web address]

## 2012-01-23 NOTE — Progress Notes (Signed)
Patient's son expressed concerns over patient's increasing tremors in bilateral arms over the past 6 months.  MD notified.  Will continue to monitor. Nolon Nations

## 2012-01-23 NOTE — Progress Notes (Signed)
Subjective: Dizziness then chest pain with increase of BP, 2 NTG given with relief of pain but not dizziness, still with some dizziness though not as severe Objective: Vital signs in last 24 hours: Temp:  [97.7 F (36.5 C)-98.3 F (36.8 C)] 98 F (36.7 C) (10/23 1000) Pulse Rate:  [69-86] 70  (10/23 1000) Resp:  [16-18] 16  (10/23 1000) BP: (86-187)/(48-86) 110/68 mmHg (10/23 1000) SpO2:  [94 %-99 %] 96 % (10/23 1000) Weight:  [65.681 kg (144 lb 12.8 oz)] 65.681 kg (144 lb 12.8 oz) (10/23 0601) Weight change: -1.179 kg (-2 lb 9.6 oz) Last BM Date: 01/23/12 Intake/Output from previous day: none noted   Intake/Output this shift:    PE: General:alert and oriented, pleasant affect Neck:rt. ecchymosis from scalp hematoma from fall with syncope Heart:S1S2 RRR Lungs:clear ZOX:WRUE non tender Ext:no edema Neuro:no arm drift, grips and push pull of feet equal.  Continues with tremors    Lab Results:  Basename 01/20/12 2157 01/20/12 1301  WBC 7.4 10.8*  HGB 11.3* 12.3  HCT 35.0* 38.2  PLT 166 208   BMET  Basename 01/22/12 0630 01/21/12 0342  NA 141 141  K 4.1 3.4*  CL 105 104  CO2 26 29  GLUCOSE 99 88  BUN 11 15  CREATININE 0.75 0.80  CALCIUM 9.2 8.8    Basename 01/23/12 0906 01/23/12 0200  TROPONINI <0.30 <0.30    Lab Results  Component Value Date   CHOL 150 11/08/2011   HDL 61 11/08/2011   LDLCALC 76 11/08/2011   TRIG 65 11/08/2011   CHOLHDL 2.5 11/08/2011   Lab Results  Component Value Date   HGBA1C 5.3 01/20/2012     Lab Results  Component Value Date   TSH 2.178 01/20/2012    Hepatic Function Panel  Basename 01/21/12 0342  PROT 6.5  ALBUMIN 3.3*  AST 23  ALT 17  ALKPHOS 169*  BILITOT 0.6  BILIDIR --  IBILI --   No results found for this basename: CHOL in the last 72 hours No results found for this basename: PROTIME in the last 72 hours    EKG: Orders placed during the hospital encounter of 01/20/12  . ED EKG  . ED EKG  . EKG 12-LEAD  . EKG  12-LEAD  . EKG 12-LEAD  . EKG 12-LEAD  . EKG 12-LEAD  . EKG 12-LEAD    Studies/Results: No results found.  Medications: I have reviewed the patient's current medications.    [name] amiodarone  200 mg Oral Daily  . aspirin EC  81 mg Oral Daily  . calcium carbonate  1 tablet Oral Daily  . ciprofloxacin  500 mg Oral BID  . clopidogrel  75 mg Oral Daily  . furosemide  40 mg Oral QODAY  . heparin  5,000 Units Subcutaneous Q8H  . levothyroxine  75 mcg Oral Daily  . losartan  150 mg Oral Daily  . meclizine  25 mg Oral BID  . metoprolol  100 mg Oral BID  . pantoprazole  40 mg Oral BID  . ranolazine  500 mg Oral BID  . simvastatin  20 mg Oral QPM  . sodium chloride  3 mL Intravenous Q12H  . traMADol  25 mg Oral QHS  . DISCONTD: furosemide  40 mg Oral QODAY   Assessment/Plan: Principal Problem:  *SYMPTOM, SYNCOPE AND COLLAPSE Active Problems:  CORONARY ARTERY DISEASE  H/O cardiac pacemaker, [employer]tr[employer] versa implanted 01/2008, though original implanted 2000 for syncope and heart block and ventricular asystole.  Paroxysmal a-fib, history of, off coumadin since 10/2010, without recurrence of afib.  HYPOTHYROIDISM  HYPERTENSION  UTI (urinary tract infection)  Acute kidney injury  PLAN: now with dizziness and chest pain during the night.  BP was elevated 180/77.  Last nuc was 08/2011 and was negative for ischemia.  Recent eval by GI and not felt to be GI source of chest pain. Negative cardiac enzymes,  CT of head pending, no prior dizziness, ? Slow bleed after fall Our office was to arrange neuro visit at one point, but does not seem to have happened, ? Neuro consult    LOS: 3 days   INGOLD,LAURA R 01/23/2012, 11:15 AM   Patient seen and examined. Agree with assessment and plan. No chest pain presently. Feels better with initiation of ranolazine. BP improved from yesterday. Good diuresis.    Lennette Bihari, MD, [facility] 01/23/2012 3:32 PM

## 2012-01-23 NOTE — Progress Notes (Signed)
Pt complaining of 5/10 chest pain and dizziness. Pt stated that this chest pain felt like yesterday's chest pressure. BP 187/78 HR 77. EKG obtained. 2 sublingual Nitro given. Pt BP dropped as low as 93/59 about 15 minutes after second SL NTG. BP returned to 143/72 about 20 minutes after second SL NTG.  MD paged and notified. Orders received to cycle enzymes. Pt is now resting comfortably. Will continue to monitor.  Harless Litten, RN 01/23/12

## 2012-01-23 NOTE — Consult Note (Signed)
Reason for Consult: shaking Referring Physician: Dr. Isidoro Donning  CC: shaking for 4 months  HPI: [name] Luna is an 78 y.o. female who was admitted for syncope possible related to orthostatic hypotension vx UTI. She has had some vertigo. Head CT bilateral posterior convexity scalp hematomas without underlying fracture secondary to fall. She has been started on meclizine, referred for PT/OT vestibular evaluation.  We are consulted for 4-6 mos of trembling at rest noted by patient and family. No memory loss. Patient and son denies any problems before this. Patient does not report any shuffling gait or freezing.  No new medications. TSH 2.178 on replacement therapy. No family history of parkinsons or involuntary movement disorders.   Past Medical History  Diagnosis Date  . Hypertension   . CHF (congestive heart failure)   . A-fib   . Hypothyroid   . GERD (gastroesophageal reflux disease)   . Hypothyroidism   . Bradycardia   . Pancreatitis   . Coronary artery disease   . Shingles   . CORONARY ARTERY DISEASE 11/28/2006    Qualifier: Diagnosis of  By: Tawanna Cooler MD, Eugenio Hoes   . Pacemaker   . Paroxysmal a-fib, history of 08/02/2011  . Tremor, essential 08/02/2011  . Esophageal disorder, with history of dilatation 08/02/2011  . Angina   . H/O hiatal hernia   . Neuromuscular disorder     essential tremors  . UTI (lower urinary tract infection) 08/03/2011  . H/O cardiac pacemaker, [employer] versa implanted 01/2008, though original implanted 2000 for syncope and heart block and ventricular asystole. 08/02/2011  . Dizziness, near syncope 11/07/2011  . HTN (hypertension), accelerated this admit 11/08/2011    Past Surgical History  Procedure Date  . Permanent pacemaker   . S/p total hyserectomy and left oophorectomy   . S/p cholecystectomy   . Repair spigelian hernia   . S/p childbirth     x2  . S/p cataract extraction with lens implant   . S/p appendectomy   . Splint 2008  . Coronary angioplasty   .  Insert / replace / remove pacemaker   . Cholecystectomy   . Appendectomy     Family History  Problem Relation Age of Onset  . Sudden death Mother     Social History:  reports that she has never smoked. She has never used smokeless tobacco. She reports that she does not drink alcohol or use illicit drugs.  Allergies  Allergen Reactions  . Codeine Other (See Comments)    Passes out  . Isosorbide Mononitrate     REACTION: fainted  . Lorazepam Other (See Comments)    hallucinations  . Prednisone Other (See Comments)    nausea  . Valium (Diazepam)   . Penicillins Rash    Medications:  Prior to Admission:  Prescriptions prior to admission  Medication Sig Dispense Refill  . amiodarone (PACERONE) 200 MG tablet Take 200 mg by mouth daily.        [name] aspirin 81 MG EC tablet Take 81 mg by mouth daily.        . calcium carbonate (TUMS - DOSED IN MG ELEMENTAL CALCIUM) 500 MG chewable tablet Chew 1 tablet by mouth daily.       . clopidogrel (PLAVIX) 75 MG tablet Take 75 mg by mouth daily.        . furosemide (LASIX) 40 MG tablet Take 40 mg by mouth daily.      [name] levothyroxine (SYNTHROID, LEVOTHROID) 75 MCG tablet Take 75 mcg by mouth  daily.      . loratadine (CLARITIN) 10 MG tablet Take 10 mg by mouth daily.        [name] losartan (COZAAR) 100 MG tablet Take 150 mg by mouth daily.      . metoprolol (LOPRESSOR) 100 MG tablet Take 100 mg by mouth 2 (two) times daily.      . Multiple Vitamin (MULTIVITAMIN) capsule Take 1 capsule by mouth daily.        . Multiple Vitamins-Minerals (PRESERVISION/LUTEIN) CAPS Take by mouth 2 (two) times daily.        . pantoprazole (PROTONIX) 40 MG tablet Take 40 mg by mouth 2 (two) times daily.      . potassium chloride SA (K-DUR,KLOR-CON) 20 MEQ tablet Take 20 mEq by mouth daily.       . simvastatin (ZOCOR) 20 MG tablet Take 20 mg by mouth every evening.      . traMADol (ULTRAM) 50 MG tablet Take 25 mg by mouth at bedtime.       . nitroGLYCERIN (NITROSTAT) 0.4 MG  SL tablet Place 0.4 mg under the tongue every 5 (five) minutes as needed.         Scheduled:   . amiodarone  200 mg Oral Daily  . aspirin EC  81 mg Oral Daily  . calcium carbonate  1 tablet Oral Daily  . ciprofloxacin  500 mg Oral BID  . clopidogrel  75 mg Oral Daily  . furosemide  40 mg Oral QODAY  . levothyroxine  75 mcg Oral Daily  . losartan  150 mg Oral Daily  . meclizine  25 mg Oral BID  . metoprolol  100 mg Oral BID  . pantoprazole  40 mg Oral BID  . ranolazine  500 mg Oral BID  . simvastatin  20 mg Oral QPM  . sodium chloride  3 mL Intravenous Q12H  . traMADol  25 mg Oral QHS  . DISCONTD: furosemide  40 mg Oral QODAY  . DISCONTD: heparin  5,000 Units Subcutaneous Q8H    ROS: History obtained from child, chart review and the patient  General ROS: negative for - chills, fatigue, fever, night sweats, weight gain or weight loss Psychological ROS: negative for - behavioral disorder, hallucinations, memory difficulties, mood swings or suicidal ideation Ophthalmic ROS: negative for - blurry vision, double vision, eye pain or loss of vision ENT ROS: negative for - epistaxis, nasal discharge, oral lesions, sore throat, tinnitus or vertigo Allergy and Immunology ROS: negative for - hives or itchy/watery eyes Hematological and Lymphatic ROS: negative for - bleeding problems, bruising or swollen lymph nodes Endocrine ROS: negative for - galactorrhea, hair pattern changes, polydipsia/polyuria or temperature intolerance Respiratory ROS: negative for - cough, hemoptysis, shortness of breath or wheezing Cardiovascular ROS: negative for - chest pain, dyspnea on exertion, edema or irregular heartbeat Gastrointestinal ROS: negative for - abdominal pain, diarrhea, hematemesis, nausea/vomiting or stool incontinence Genito-Urinary ROS: negative for - dysuria, hematuria, incontinence or urinary frequency/urgency Musculoskeletal ROS: negative for - joint swelling or muscular weakness,  +face/arm/hand "trembles" Neurological ROS: as noted in HPI Dermatological ROS: negative for rash and skin lesion changes   Physical Examination: Blood pressure 160/75, pulse 70, temperature 97.7 F (36.5 C), temperature source Oral, resp. rate 18, height 5\' 4"  (1.626 m), weight 65.681 kg (144 lb 12.8 oz), SpO2 97.00%.  Neurologic Examination Mental Status: Alert, oriented, thought content appropriate.  Speech fluent without evidence of aphasia.  Able to follow 3 step commands without difficulty. Cranial Nerves: II:  visual fields grossly normal, pupils equal, round. Discs flat bilaterally III,IV, VI: ptosis not present, extra-ocular motions intact bilaterally V,VII: right facial droop, facial light touch sensation normal bilaterally VIII: hearing normal bilaterally IX,X: gag reflex present XI: trapezius strength/neck flexion strength normal bilaterally XII: tongue strength normal  Motor: Right : Upper extremity   5/5    Left:     Upper extremity   5/5  Lower extremity   4/5     Lower extremity   5/5 Small amplitude high frequency tremor noted in the bilateral upper extremities, R>L.  Normal tone.  +mirror movements in the upper extremities. Sensory: Pinprick and light touch intact throughout, bilaterally Deep Tendon Reflexes: 2+ and symmetric throughout.  Positive palmomental bilaterally and glabellar. Plantars: Right: downgoing   Left: downgoing Cerebellar: finger to nose and heel to shin intact.     Results for orders placed during the hospital encounter of 01/20/12 (from the past 48 hour(s))  CK TOTAL AND CKMB     Status: Normal   Collection Time   01/21/12  9:04 PM      Component Value Range Comment   Total CK 91  7 - 177 U/L    CK, MB 2.9  0.3 - 4.0 ng/mL    Relative Index RELATIVE INDEX IS INVALID  0.0 - 2.5   BASIC METABOLIC PANEL     Status: Abnormal   Collection Time   01/22/12  6:30 AM      Component Value Range Comment   Sodium 141  135 - 145 mEq/L    Potassium  4.1  3.5 - 5.1 mEq/L DELTA CHECK NOTED   Chloride 105  96 - 112 mEq/L    CO2 26  19 - 32 mEq/L    Glucose, Bld 99  70 - 99 mg/dL    BUN 11  6 - 23 mg/dL    Creatinine, Ser 1.61  0.50 - 1.10 mg/dL    Calcium 9.2  8.4 - 09.6 mg/dL    GFR calc non Af Amer 73 (*) >90 mL/min    GFR calc Af Amer 84 (*) >90 mL/min   CK TOTAL AND CKMB     Status: Normal   Collection Time   01/22/12  6:30 AM      Component Value Range Comment   Total CK 88  7 - 177 U/L    CK, MB 2.9  0.3 - 4.0 ng/mL    Relative Index RELATIVE INDEX IS INVALID  0.0 - 2.5   CK TOTAL AND CKMB     Status: Normal   Collection Time   01/22/12 12:52 PM      Component Value Range Comment   Total CK 99  7 - 177 U/L    CK, MB 3.1  0.3 - 4.0 ng/mL    Relative Index RELATIVE INDEX IS INVALID  0.0 - 2.5   CK TOTAL AND CKMB     Status: Normal   Collection Time   01/22/12  9:35 PM      Component Value Range Comment   Total CK 99  7 - 177 U/L    CK, MB 3.3  0.3 - 4.0 ng/mL    Relative Index RELATIVE INDEX IS INVALID  0.0 - 2.5   CK TOTAL AND CKMB     Status: Normal   Collection Time   01/23/12  2:00 AM      Component Value Range Comment   Total CK 84  7 - 177 U/L  CK, MB 2.8  0.3 - 4.0 ng/mL    Relative Index RELATIVE INDEX IS INVALID  0.0 - 2.5   TROPONIN I     Status: Normal   Collection Time   01/23/12  2:00 AM      Component Value Range Comment   Troponin I <0.30  <0.30 ng/mL   CK TOTAL AND CKMB     Status: Normal   Collection Time   01/23/12  9:06 AM      Component Value Range Comment   Total CK 78  7 - 177 U/L    CK, MB 2.6  0.3 - 4.0 ng/mL    Relative Index RELATIVE INDEX IS INVALID  0.0 - 2.5   TROPONIN I     Status: Normal   Collection Time   01/23/12  9:06 AM      Component Value Range Comment   Troponin I <0.30  <0.30 ng/mL   CK TOTAL AND CKMB     Status: Normal   Collection Time   01/23/12  2:00 PM      Component Value Range Comment   Total CK 72  7 - 177 U/L    CK, MB 2.5  0.3 - 4.0 ng/mL    Relative  Index RELATIVE INDEX IS INVALID  0.0 - 2.5   TROPONIN I     Status: Normal   Collection Time   01/23/12  2:00 PM      Component Value Range Comment   Troponin I <0.30  <0.30 ng/mL       Ct Head Wo Contrast  01/23/2012  *RADIOLOGY REPORT*  Clinical Data: 78 year old female status post fall.  Vertigo and pain.  CT HEAD WITHOUT CONTRAST  Technique:  Contiguous axial images were obtained from the base of the skull through the vertex without contrast.  Comparison: 11/20/2011 and earlier.  Findings: Mildly decreased right superior convexity superficial scalp hematoma.  Underlying calvarium remains intact.  There is new broad-based left posterior lateral scalp soft tissue swelling compatible with a second scalp hematoma (series 3 image 39). Calvarium on this side also appears intact.  No acute orbit soft tissue findings. Calcified atherosclerosis at the skull base.  Visualized paranasal sinuses and mastoids are clear.  Stable cerebral volume.  No ventriculomegaly. No midline shift, mass effect, or evidence of mass lesion.  Bilateral patchy and confluent white matter hypodensity is not significantly changed. No acute intracranial hemorrhage identified.  No evidence of cortically based acute infarction identified.  IMPRESSION: 1.  Bilateral posterior convexity scalp hematomas without underlying fracture. 2. No acute intracranial abnormality.   Original Report Authenticated By: Harley Hallmark, M.D.     Job Founds, MBA, MHA [facility] Pager [phone]   Patient seen and examined.  Clinical course and management discussed.  Necessary edits performed.  I agree with the above.  Assessment and recommendations are discussed below.     Assessment/Plan:  78yo female with new onset tremor over past 6 mos. Exam most consistent with a benign essential tremor.  She has no increased tone on exam at this time and no evidence of a pill-rolling character.  With patient being admitted for syncope and  possible OH would not start any treatment at this time since many of the possible treatments may potentiate this. Would have patient follow up with neurology as an outpatient for possible treatment.     Thana Farr, MD [facility] [phone]  01/23/2012  6:47 PM

## 2012-01-23 NOTE — Progress Notes (Signed)
At 00:57, patient called for assistance. Stated she was "swimmy-headed." Responded to room and obtained VS. At this time, patient stated chest pain also accompanying symptoms, rated 5/10, non-radiating, feeling of vertigo without SOB, nausea or vomiting. VS:  BP 187/78, P 74, 97% SPO2 on room air, NSR on telemetry. At 01:02, gave NTG 0.4mg  SL and placed on O2 per [place] at 2L/m.  At 5 minutes, patient states chest pain unchanged, vertigo unresolved. VS:  BP 169/80, P 77, 97% on O2 at 2L per [place], NSR on telemetry. Gave NTG 0.4mg  and obtained EKG. MD paged.  At 10 minutes, patient rates chest pain at 4/10, vertigo unresolved. VS:  BP 145/76, P 75, O2 97% on O2 at 2L/m per [place], NSR per EKG. No ST elevation or depression noted. T wave inversion in V1 and V2, flattening in V3 - V5.  At 15 minutes, patient rates chest pain at 3/10, vertigo unresolved. VS:  BP 106/64, P 71, 98% on O2 at 2L/m per [place], NSR on telemetry Held NTG for BP. Second MD page made.  At 20 minutes, patient rates chest pain unchanged, vertigo unresolved. VS:  BP 93/59, P 69 O2 95% on 2L/m per [place], NSR on telemetry. Held NTG for BP, increased O2 to 4L/m per [place].  At 25 minutes, patient rates chest pain unchanged, vertigo unresolved. VS:  BP 125/76, P 70, O2 97% on 4L/m per [place], NSR on telemetry. Held NTG.  At 30 minutes, patient rates chest pain at 3/10, vertigo "a little better." VS:  BP 143/72, P 69, O2 97% on 4L/m per [place], NSR on telemetry. Held NTG for nominal patient response to previous treatment. Discussed with patient's primary nurse. Encouraged third MD page for patient concerns/follow-up needed. Patient stable with no outward distress noted. Will continue to monitor.

## 2012-01-24 DIAGNOSIS — I5189 Other ill-defined heart diseases: Secondary | ICD-10-CM | POA: Diagnosis present

## 2012-01-24 DIAGNOSIS — R82998 Other abnormal findings in urine: Secondary | ICD-10-CM

## 2012-01-24 LAB — CK TOTAL AND CKMB (NOT AT ARMC)
CK, MB: 2.4 ng/mL (ref 0.3–4.0)
Relative Index: INVALID (ref 0.0–2.5)

## 2012-01-24 MED ORDER — LOSARTAN POTASSIUM 50 MG PO TABS
100.0000 mg | ORAL_TABLET | Freq: Every day | ORAL | Status: DC
  Administered 2012-01-25: 100 mg via ORAL
  Filled 2012-01-24 (×2): qty 2

## 2012-01-24 MED ORDER — CIPROFLOXACIN HCL 500 MG PO TABS: 250.0000 mg | ORAL_TABLET | Freq: Two times a day (BID) | ORAL | Status: DC

## 2012-01-24 MED ORDER — MECLIZINE HCL 25 MG PO TABS: 25.0000 mg | ORAL_TABLET | Freq: Two times a day (BID) | ORAL | Status: DC

## 2012-01-24 MED ORDER — RANOLAZINE ER 500 MG PO TB12: 500.0000 mg | ORAL_TABLET | Freq: Two times a day (BID) | ORAL | Status: DC

## 2012-01-24 MED ORDER — POTASSIUM CHLORIDE CRYS ER 20 MEQ PO TBCR: 20.0000 meq | EXTENDED_RELEASE_TABLET | ORAL | Status: DC

## 2012-01-24 MED ORDER — FUROSEMIDE 40 MG PO TABS: 40.0000 mg | ORAL_TABLET | ORAL | Status: DC

## 2012-01-24 MED ORDER — POLYETHYLENE GLYCOL 3350 17 G PO PACK: 17.0000 g | PACK | Freq: Every day | ORAL | Status: DC | PRN

## 2012-01-24 NOTE — Progress Notes (Signed)
Physical Therapy Treatment Patient Details Name: [name] Luna MRN: [id number] DOB: 1920/11/21 Today's Date: 01/24/2012 Time: 4098-1191 PT Time Calculation (min): 30 min  PT Assessment / Plan / Recommendation Comments on Treatment Session  pt presents with UTI, Syncope, and fall at home.  pt today inidcating feeling dizzy after ambulating 160' and required chair to return to room.  pt's BP 85/58 and the increased to 100/63 after sitting with feet up in recliner ~5mins.  MD and Nsg Tech made aware of BP.  Will continue to follow.      Follow Up Recommendations  Home health PT;Supervision/Assistance - 24 hour (Would benefit from [facility] too.  )     Does the patient have the potential to tolerate intense rehabilitation     Barriers to Discharge        Equipment Recommendations  None recommended by PT    Recommendations for Other Services    Frequency Min 3X/week   Plan Discharge plan remains appropriate;Frequency remains appropriate    Precautions / Restrictions Precautions Precautions: Fall Restrictions Weight Bearing Restrictions: No   Pertinent Vitals/Pain Denies pain, but indicated dizziness during ambulation.  BP found to be 85/58.  Pt became diaphoretic and returned chair.  BP after sitting 2 mins with feet up 100/63.      Mobility  Bed Mobility Bed Mobility: Not assessed Transfers Transfers: Sit to Stand;Stand to Sit Sit to Stand: 4: Min guard;With upper extremity assist;From bed Stand to Sit: 4: Min assist;With upper extremity assist;To chair/3-in-1;With armrests Details for Transfer Assistance: pt needed A to return to chair secondary to low BP.   Ambulation/Gait Ambulation/Gait Assistance: 4: Min guard Ambulation Distance (Feet): 175 Feet Assistive device: Rolling walker Ambulation/Gait Assistance Details: pt initially notes only feeling weak, but agreeable to ambulate.  At ~160' pt indicates feeling a little dizzy.  Continued ambulating up to BP machine with BP  found to be 85/58.  pt notes dizziness increasing the longer she stayed standing and pt becoming diaphoretic.  pt returned to room in recliner with BP then found to be 100/63.  MD and Nsg Tech made aware.   Gait Pattern: Step-through pattern;Decreased stride length Stairs: No Wheelchair Mobility Wheelchair Mobility: No    Exercises     PT Diagnosis:    PT Problem List:   PT Treatment Interventions:     PT Goals Acute Rehab PT Goals Time For Goal Achievement: 02/04/12 PT Goal: Sit to Stand - Progress: Progressing toward goal PT Goal: Stand to Sit - Progress: Progressing toward goal PT Goal: Ambulate - Progress: Progressing toward goal  Visit Information  Last PT Received On: 01/24/12 Assistance Needed: +1    Subjective Data  Subjective: I just feel a little weak today.     Cognition  Overall Cognitive Status: History of cognitive impairments - at baseline Arousal/Alertness: Awake/alert Orientation Level: Appears intact for tasks assessed Behavior During Session: [facility] for tasks performed Cognition - Other Comments: demonstrates some STM loss with recounting history and has difficulty with timeline, looks to son to fill in the blanks    Balance  Balance Balance Assessed: No  End of Session PT - End of Session Equipment Utilized During Treatment: Gait belt Activity Tolerance: Treatment limited secondary to medical complications (Comment) Patient left: in chair;with call bell/phone within reach;with family/visitor present (Son arrived at end of session) Nurse Communication: Mobility status   GP     Sunny Schlein, [place] [phone] 01/24/2012, 9:39 AM

## 2012-01-24 NOTE — Evaluation (Signed)
Occupational Therapy Evaluation Patient Details Name: MORGANNE HAILE MRN: [id number] DOB: Dec 16, 1920 Today's Date: 01/24/2012 Time: 4098-1191 OT Time Calculation (min): 23 min  OT Assessment / Plan / Recommendation Clinical Impression  Pt is 78 yo female who presents after syncopal episode in her kitchen while she was fixing her tea, with laceration to posterior head as well as RUE. Pt has poor vision with h/o cataracts and macular degeneration per her report. She also had drop in BP today after ambulation 160'. RN and NT made aware. Will follow acutely to maximize I with ADL and ADL mobility prior to d/c. Son is able to provide 24hr assist upon d/c, therefore, recommend HHOT as well as HH aid, if possible.     OT Assessment  Patient needs continued OT Services    Follow Up Recommendations  Home health OT;Supervision/Assistance - 24 hour (HHaid)    Barriers to Discharge      Equipment Recommendations  None recommended by OT;None recommended by PT    Recommendations for Other Services    Frequency  Min 2X/week    Precautions / Restrictions Precautions Precautions: Fall Restrictions Weight Bearing Restrictions: No   Pertinent Vitals/Pain Pt denies any pain, but indicated dizziness during ambulation. BP found to be 85/58. Pt became diaphoretic and returned chair. BP after sitting 2 mins with feet up 100/63     ADL  Eating/Feeding: Performed;Set up;Supervision/safety (benign essential tremors) Where Assessed - Eating/Feeding: Chair Grooming: Performed;Min guard Where Assessed - Grooming: Unsupported standing Upper Body Dressing: Simulated;Min guard Where Assessed - Upper Body Dressing: Unsupported sitting Lower Body Dressing: Simulated;Moderate assistance Where Assessed - Lower Body Dressing: Unsupported sit to stand Toilet Transfer: Simulated;Min guard Toilet Transfer Method: Sit to Barista:  (from bed) Toileting - Clothing Manipulation and Hygiene:  Simulated;Minimal assistance Where Assessed - Engineer, mining and Hygiene: Standing Equipment Used: Gait belt;Rolling walker Transfers/Ambulation Related to ADLs: Pt Min guard A with ambulation; pt became dizzy after ambulating ~44ft, BP taken and found to be 85/58- had pt sit and BP rose to 100/68.     OT Diagnosis: Generalized weakness;Cognitive deficits;Disturbance of vision  OT Problem List: Decreased activity tolerance;Decreased strength;Impaired balance (sitting and/or standing);Impaired vision/perception;Decreased safety awareness;Decreased knowledge of use of DME or AE;Decreased knowledge of precautions;Cardiopulmonary status limiting activity OT Treatment Interventions: Self-care/ADL training;DME and/or AE instruction;Therapeutic activities;Visual/perceptual remediation/compensation;Patient/family education;Balance training   OT Goals Acute Rehab OT Goals OT Goal Formulation: With patient Time For Goal Achievement: 01/31/12 Potential to Achieve Goals: Good ADL Goals Pt Will Perform Grooming: with modified independence;Standing at sink ADL Goal: Grooming - Progress: Goal set today Pt Will Perform Upper Body Dressing: Independently;Sitting, chair;Sitting, bed ADL Goal: Upper Body Dressing - Progress: Goal set today Pt Will Perform Lower Body Dressing: with supervision;Sit to stand from bed;Sit to stand from chair ADL Goal: Lower Body Dressing - Progress: Goal set today Pt Will Transfer to Toilet: with modified independence;Ambulation;with DME ADL Goal: Toilet Transfer - Progress: Goal set today Pt Will Perform Toileting - Clothing Manipulation: Independently;Standing ADL Goal: Toileting - Clothing Manipulation - Progress: Goal set today Pt Will Perform Toileting - Hygiene: Independently;Sitting on 3-in-1 or toilet ADL Goal: Toileting - Hygiene - Progress: Goal set today Pt Will Perform Tub/Shower Transfer: Tub transfer;with min assist;Ambulation;with DME ADL Goal:  Tub/Shower Transfer - Progress: Goal set today Additional ADL Goal #1: Pt will verbalize and implement fall prevention techniques related to orthostatic hypotension and poor vision. ADL Goal: Additional Goal #1 - Progress: Goal set today  Visit Information  Last OT Received On: 01/24/12 Assistance Needed: +1    Subjective Data  Subjective: I'm feeling better today, just really weak. Patient Stated Goal: Return home   Prior Functioning     Home Living Lives With: Son Available Help at Discharge: Family;Available 24 hours/day Type of Home: House Home Access: Stairs to enter [employer] of Steps: 1 (2x) Entrance Stairs-Rails: None Home Layout: One level Bathroom Shower/Tub: Engineer, manufacturing systems: Standard Bathroom Accessibility: Yes How Accessible: Accessible via walker Home Adaptive Equipment: Bedside commode/3-in-1;Walker - rolling;Straight cane (had tub bench and would not use it so it was sent back) Prior Function Level of Independence: Needs assistance Needs Assistance: Light Housekeeping;Meal Prep;Bathing Bath: Other (comment) (sponge baths only) Meal Prep: Minimal Light Housekeeping: Moderate Able to Take Stairs?: Yes Driving: No Vocation: Retired Musician: HOH Dominant Hand: Right         Vision/Perception Vision - Assessment Eye Alignment:  (appears to be so, but question this given pt's report) Additional Comments: pt with mild left peripheral visual field deficit- unsure of cause. Pt also reports she is unable to see colors (due to macular degeneration?) and her central vision is impaired. However, during testing pt able to see fingers held in her centeral field and not outside (on left or right) which is more indicative of glaucoma . The lack of color discrimination is consistent with mac degen-  Will attempt to question son regarding pt's visual history as pt is a questionable historian.   Cognition  Overall  Cognitive Status: History of cognitive impairments - at baseline Arousal/Alertness: Awake/alert Orientation Level: Appears intact for tasks assessed Behavior During Session: [facility] for tasks performed Cognition - Other Comments: demonstrates some STM loss with recounting history and has difficulty with timeline, looks to son to fill in the blanks    Extremity/Trunk Assessment Right Upper Extremity Assessment RUE ROM/Strength/Tone: Deficits RUE ROM/Strength/Tone Deficits: grossly 4/5 RUE Coordination: Deficits RUE Coordination Deficits: benign essential tremors make fine motor skills difficult Left Upper Extremity Assessment LUE ROM/Strength/Tone: Deficits LUE ROM/Strength/Tone Deficits:  (grossly 4/5) LUE Coordination: Deficits LUE Coordination Deficits: benign essential tremors make fine motor skills difficult     Mobility Bed Mobility Bed Mobility: Not assessed Transfers Sit to Stand: 4: Min guard;With upper extremity assist;From bed Stand to Sit: 4: Min assist;With upper extremity assist;To chair/3-in-1;With armrests Details for Transfer Assistance: pt needed A to return to chair secondary to low BP.       Shoulder Instructions     Exercise Other Exercises Other Exercises: encouarged ankle pumps   Balance Balance Balance Assessed: No   End of Session  Pt left in chair with call bell and family in the room  GO     Kenidi Elenbaas 01/24/2012, 10:20 AM

## 2012-01-24 NOTE — Progress Notes (Addendum)
[facility]  Subjective: Dizzy with ambulation today.  Objective: Vital signs in last 24 hours: Temp:  [97.4 F (36.3 C)-98.2 F (36.8 C)] 98 F (36.7 C) (10/24 0800) Pulse Rate:  [69-86] 74  (10/24 0800) Resp:  [15-18] 16  (10/24 0800) BP: (86-171)/(48-82) 153/75 mmHg (10/24 0800) SpO2:  [94 %-98 %] 98 % (10/24 0800) Weight:  [69.536 kg (153 lb 4.8 oz)] 69.536 kg (153 lb 4.8 oz) (10/24 0400) Last BM Date: 01/23/12  Intake/Output from previous day: 10/23 0701 - 10/24 0700 In: 240 [P.O.:240] Out: -  Intake/Output this shift:    Medications Current Facility-Administered Medications  Medication Dose Route Frequency Provider Last Rate Last Dose  . acetaminophen (TYLENOL) tablet 650 mg  650 mg Oral Q6H PRN Marinda Elk, MD   650 mg at 01/23/12 0601   Or  . acetaminophen (TYLENOL) suppository 650 mg  650 mg Rectal Q6H PRN Marinda Elk, MD      . amiodarone (PACERONE) tablet 200 mg  200 mg Oral Daily Marinda Elk, MD   200 mg at 01/23/12 0956  . aspirin EC tablet 81 mg  81 mg Oral Daily Marinda Elk, MD   81 mg at 01/23/12 0956  . calcium carbonate (TUMS - dosed in mg elemental calcium) chewable tablet 200 mg of elemental calcium  1 tablet Oral Daily Marinda Elk, MD   200 mg of elemental calcium at 01/23/12 0956  . ciprofloxacin (CIPRO) tablet 500 mg  500 mg Oral BID Marinda Elk, MD   500 mg at 01/24/12 0804  . clopidogrel (PLAVIX) tablet 75 mg  75 mg Oral Daily Marinda Elk, MD   75 mg at 01/23/12 0956  . furosemide (LASIX) tablet 40 mg  40 mg Oral QODAY Ripudeep K Rai, MD      . levothyroxine (SYNTHROID, LEVOTHROID) tablet 75 mcg  75 mcg Oral Daily Marinda Elk, MD   75 mcg at 01/23/12 0956  . losartan (COZAAR) tablet 150 mg  150 mg Oral Daily Marinda Elk, MD   150 mg at 01/23/12 0956  . meclizine (ANTIVERT) tablet 25 mg  25 mg Oral BID Ripudeep Jenna Luo, MD   25 mg at 01/23/12 2222  .  metoprolol (LOPRESSOR) tablet 100 mg  100 mg Oral BID Marinda Elk, MD   100 mg at 01/23/12 2222  . nitroGLYCERIN (NITROSTAT) SL tablet 0.4 mg  0.4 mg Sublingual Q5 min PRN Marinda Elk, MD   0.4 mg at 01/23/12 0108  . ondansetron (ZOFRAN) tablet 4 mg  4 mg Oral Q6H PRN Marinda Elk, MD       Or  . ondansetron [facility]) injection 4 mg  4 mg Intravenous Q6H PRN Marinda Elk, MD      . pantoprazole (PROTONIX) EC tablet 40 mg  40 mg Oral BID Marinda Elk, MD   40 mg at 01/23/12 2222  . polyethylene glycol (MIRALAX / GLYCOLAX) packet 17 g  17 g Oral Daily PRN Marinda Elk, MD   17 g at 01/22/12 2150  . ranolazine (RANEXA) 12 hr tablet 500 mg  500 mg Oral BID Ripudeep Jenna Luo, MD   500 mg at 01/23/12 2222  . simvastatin (ZOCOR) tablet 20 mg  20 mg Oral QPM Marinda Elk, MD   20 mg at 01/23/12 1720  . sodium chloride 0.9 % injection 3 mL  3 mL Intravenous Q12H Marinda Elk, MD  3 mL at 01/23/12 2225  . traMADol (ULTRAM) tablet 25 mg  25 mg Oral QHS Marinda Elk, MD   25 mg at 01/23/12 2225  . DISCONTD: furosemide (LASIX) tablet 40 mg  40 mg Oral QODAY Marinda Elk, MD      . DISCONTD: heparin injection 5,000 Units  5,000 Units Subcutaneous Q8H Marinda Elk, MD   5,000 Units at 01/23/12 1413    PE: General appearance: alert, cooperative and no distress Lungs: clear to auscultation bilaterally Heart: regular rate and rhythm, S1, S2 normal, 1/6  murmur, click, rub or gallop Extremities: No LEE Pulses: 2+ and symmetric Neurologic: Grossly normal  Lab Results:  No results found for this basename: WBC:3,HGB:3,HCT:3,PLT:3 in the last 72 hours BMET  Basename 01/22/12 0630  NA 141  K 4.1  CL 105  CO2 26  GLUCOSE 99  BUN 11  CREATININE 0.75  CALCIUM 9.2    Assessment/Plan   Principal Problem:  *SYMPTOM, SYNCOPE AND COLLAPSE Active Problems:  HYPOTHYROIDISM  HYPERTENSION  CORONARY ARTERY DISEASE  UTI (urinary  tract infection)  H/O cardiac pacemaker, Medtronic versa implanted 01/2008, though original implanted 2000 for syncope and heart block and ventricular asystole.  Paroxysmal a-fib, history of, off coumadin since 10/2010, without recurrence of afib.  Acute kidney injury  Vertigo  Diastolic dysfunction, grade two  Plan:  Pt hypotensive when ambulating. SBP 80's. BP normalized upon sitting down.  BP meds: lopressor 100mg  BID.  Lasix QOD.  Admin wt: 64.6kg.  Now 69.5 kg.   Consider starting midodrine.  The patient's son weighs her daily and keeps track.  I would keep one more night.     LOS: 4 days    HAGER, BRYAN 01/24/2012 8:56 AM  I have seen & examined the patient this PM after Mr. Leron Croak.  I agree with his findings, exam, impression & recommendations.  Interestingly - continues to have issues with what seems to be postural hypotension -- ? Neurocardiogenic exacerbated with UTI. Treatment of orthostatic hypotension is tempered by her notable CP when hypertensive.  -- could consider low dose midodrine while monitoring her BPs - but may just need to decrease ARB dose (will decrease to 100mg  Losartan).  Can also simply use Lasix PRN & not administer daily or QOD. I agree that Ranolazine may be the best thought for her intermittent angina to avoid hypotension.  Agree with monitoring at least 1 more night.  Marykay Lex, M.D., M.S. [facility] [address] [place], [place]  [address]  [phone] Pager # [phone] 01/24/2012 5:55 PM

## 2012-01-24 NOTE — Progress Notes (Signed)
Patient ID: [name] Luna  female  AVW:[id number]    DOB: 12/24/1920    DOA: 01/20/2012  PCP: Evette Georges, MD  Subjective: Patient was feeling " great" when seen early in the morning. However, when she was working with OT, was dizzy and her pressure again dropped to 80'S.  Objective: Weight change: 3.856 kg (8 lb 8 oz)  Intake/Output Summary (Last 24 hours) at 01/24/12 1342 Last data filed at 01/23/12 1804  Gross per 24 hour  Intake    240 ml  Output      0 ml  Net    240 ml   Blood pressure 152/79, pulse 71, temperature 97.5 F (36.4 C), temperature source Oral, resp. rate 16, height 5\' 4"  (1.626 m), weight 69.536 kg (153 lb 4.8 oz), SpO2 95.00%.  Physical Exam: General: Alert and awake, oriented x3, not in any acute distress. HEENT: anicteric sclera, pupils reactive to light and accommodation, EOMI CVS: S1-S2 clear, no murmur rubs or gallops Chest: clear to auscultation bilaterally, no wheezing, rales or rhonchi Abdomen: soft nontender, nondistended, normal bowel sounds, no organomegaly Extremities: no cyanosis, clubbing or edema noted bilaterally, tremors Skin : Extensive bruising, petechiae and hematoma on arms and back of head  Lab Results: Basic Metabolic Panel:  Lab 01/22/12 8295 01/21/12 0342 01/20/12 2157  NA 141 141 --  K 4.1 3.4* --  CL 105 104 --  CO2 26 29 --  GLUCOSE 99 88 --  BUN 11 15 --  CREATININE 0.75 0.80 --  CALCIUM 9.2 8.8 --  MG -- -- 2.2  PHOS -- -- --   Liver Function Tests:  Lab 01/21/12 0342  AST 23  ALT 17  ALKPHOS 169*  BILITOT 0.6  PROT 6.5  ALBUMIN 3.3*   No results found for this basename: LIPASE:2,AMYLASE:2 in the last 168 hours No results found for this basename: AMMONIA:2 in the last 168 hours CBC:  Lab 01/20/12 2157 01/20/12 1301  WBC 7.4 10.8*  NEUTROABS -- 9.2*  HGB 11.3* 12.3  HCT 35.0* 38.2  MCV 91.4 92.3  PLT 166 208   Cardiac Enzymes:  Lab 01/24/12 0545 01/23/12 1400 01/23/12 0906 01/23/12 0200    CKTOTAL 61 72 78 --  CKMB 2.4 2.5 2.6 --  CKMBINDEX -- -- -- --  TROPONINI -- <0.30 <0.30 <0.30   BNP: No components found with this basename: POCBNP:2 CBG: No results found for this basename: GLUCAP:5 in the last 168 hours   Micro Results: No results found for this or any previous visit (from the past 240 hour(s)).  Studies/Results: Ct Head Wo Contrast  01/23/2012  *RADIOLOGY REPORT*  Clinical Data: 78 year old female status post fall.  Vertigo and pain.  CT HEAD WITHOUT CONTRAST  Technique:  Contiguous axial images were obtained from the base of the skull through the vertex without contrast.  Comparison: 11/20/2011 and earlier.  Findings: Mildly decreased right superior convexity superficial scalp hematoma.  Underlying calvarium remains intact.  There is new broad-based left posterior lateral scalp soft tissue swelling compatible with a second scalp hematoma (series 3 image 39). Calvarium on this side also appears intact.  No acute orbit soft tissue findings. Calcified atherosclerosis at the skull base.  Visualized paranasal sinuses and mastoids are clear.  Stable cerebral volume.  No ventriculomegaly. No midline shift, mass effect, or evidence of mass lesion.  Bilateral patchy and confluent white matter hypodensity is not significantly changed. No acute intracranial hemorrhage identified.  No evidence of cortically based acute infarction identified.  IMPRESSION: 1.  Bilateral posterior convexity scalp hematomas without underlying fracture. 2. No acute intracranial abnormality.   Original Report Authenticated By: Harley Hallmark, M.D.    Ct Head Wo Contrast  01/20/2012  *RADIOLOGY REPORT*  Clinical Data: Dizziness and blurry vision  CT HEAD WITHOUT CONTRAST  Technique:  Contiguous axial images were obtained from the base of the skull through the vertex without contrast.  Comparison: 11/07/2011  Findings: There is diffuse patchy low density throughout the subcortical and periventricular white  matter consistent with chronic small vessel ischemic change.  There is prominence of the sulci and ventricles consistent with brain atrophy.  There is no evidence for acute brain infarct, hemorrhage or mass.  There is a right posterior scalp hematoma which measures 7.3 mm in thickness, image 26.  The paranasal sinuses and mastoid air cells appear clear.  The skull appears intact.  IMPRESSION:  1.  No acute intracranial abnormalities. 2.  Small vessel ischemic disease and brain atrophy. 3.  Right posterior scalp hematoma.   Original Report Authenticated By: Rosealee Albee, M.D.    Dg Chest Port 1 View  01/21/2012  *RADIOLOGY REPORT*  Clinical Data: Leukocytosis.  PORTABLE CHEST - 1 VIEW  Comparison: 11/07/2011  Findings: Heart size and appearance of pacemaker are stable.  There is atelectasis/chronic scarring at the right lung base.  No edema or focal consolidation is identified.  No pleural fluid identified.  IMPRESSION: Chronic atelectasis/scarring at the right lung base.  No acute findings.   Original Report Authenticated By: Reola Calkins, M.D.     Medications: Scheduled Meds:    . amiodarone  200 mg Oral Daily  . aspirin EC  81 mg Oral Daily  . calcium carbonate  1 tablet Oral Daily  . ciprofloxacin  500 mg Oral BID  . clopidogrel  75 mg Oral Daily  . furosemide  40 mg Oral QODAY  . levothyroxine  75 mcg Oral Daily  . losartan  150 mg Oral Daily  . meclizine  25 mg Oral BID  . metoprolol  100 mg Oral BID  . pantoprazole  40 mg Oral BID  . ranolazine  500 mg Oral BID  . simvastatin  20 mg Oral QPM  . sodium chloride  3 mL Intravenous Q12H  . traMADol  25 mg Oral QHS  . DISCONTD: heparin  5,000 Units Subcutaneous Q8H   Continuous Infusions:    Assessment/Plan: Principal Problem:  *SYMPTOM, SYNCOPE AND COLLAPSE: Possibly secondary to orthostatic hypotension, UTI versus vertigo  - Per cardiology notes, known to orthostatic hypotension, this seems to be a recurrent problem  every day, discussed with cardiology PA recommending midodrine, waiting for attending MD to agree.    HYPERTENSION: Somewhat uncontrolled, with her orthostasis and HTN, difficult to keep a balance.   Vertigo: Given worsening of her symptoms last night I ordered a CT head unable to do MRI secondary to pacemaker - CT head repeat shows no acute stroke, new second scalp hematoma, on scheduled meclizine  - PT/OT vestibular evaluation  Coronary artery sees with chest pain - Per cardiology, she has chronic anginal symptoms. I discussed with patient's son in detail who states that she was on ranolazine which did improve her anginal symptoms but was DC'ed by her cardiologist. I restarted it as it will not affect the BP and was rec'd by Dr Herbie Baltimore on 10/22.    HYPOTHYROIDISM: TSH normal - Continue levothyroxin   UTI (urinary tract infection): on cipro, no Urine culture available?  H/O cardiac pacemaker, Medtronic versa implanted 01/2008, though original implanted 2000 for syncope and heart block and ventricular asystole.   Paroxysmal a-fib, history of, off coumadin since 10/2010, without recurrence of afib.   Acute kidney injury: resolved  Tremors:  - Neuro eval is requested  DVT Prophylaxis: SCD's  Code Status: Full Code  Disposition:    LOS: 4 days   Mason Burleigh M.D. [facility] 01/24/2012, 1:42 PM Pager: [phone]  If 7PM-7AM, please contact night-coverage [email or web address] Password [email or web address]

## 2012-01-25 DIAGNOSIS — I2510 Atherosclerotic heart disease of native coronary artery without angina pectoris: Secondary | ICD-10-CM

## 2012-01-25 DIAGNOSIS — I519 Heart disease, unspecified: Secondary | ICD-10-CM

## 2012-01-25 LAB — CK TOTAL AND CKMB (NOT AT ARMC)
CK, MB: 2.2 ng/mL (ref 0.3–4.0)
CK, MB: 2.2 ng/mL (ref 0.3–4.0)
CK, MB: 2.1 ng/mL (ref 0.3–4.0)
Relative Index: INVALID (ref 0.0–2.5)
Total CK: 54 U/L (ref 7–177)
Total CK: 60 U/L (ref 7–177)

## 2012-01-25 MED ORDER — METOPROLOL TARTRATE 100 MG PO TABS
100.0000 mg | ORAL_TABLET | Freq: Every day | ORAL | Status: DC
  Administered 2012-01-25: 100 mg via ORAL
  Filled 2012-01-25 (×2): qty 1

## 2012-01-25 MED ORDER — MIDODRINE HCL 2.5 MG PO TABS
2.5000 mg | ORAL_TABLET | Freq: Three times a day (TID) | ORAL | Status: DC
  Administered 2012-01-25 – 2012-01-26 (×4): 2.5 mg via ORAL
  Filled 2012-01-25 (×6): qty 1

## 2012-01-25 MED ORDER — METOPROLOL TARTRATE 50 MG PO TABS
50.0000 mg | ORAL_TABLET | Freq: Every day | ORAL | Status: DC
  Administered 2012-01-26: 50 mg via ORAL
  Filled 2012-01-25 (×2): qty 1

## 2012-01-25 NOTE — Progress Notes (Signed)
Physical Therapy Treatment Patient Details Name: LOUCINDA CROY MRN: [id number] DOB: [birth year] Today's Date: 01/25/2012 Time: 4098-1191 PT Time Calculation (min): 13 min  PT Assessment / Plan / Recommendation Comments on Treatment Session  pt presents with UTI, Syncope, and fall at home.  pt's BP sitting EOB 150/84, after standing almost 2 mins BP 101/62 with pt c/o feeling light headed and weak.  Discussed with RN pt's BPs and that pt and son seem frustrated as they keep getting told pt will D/C home in the morning, yet she continues to be orthostatic.      Follow Up Recommendations  Home health PT;Supervision/Assistance - 24 hour     Does the patient have the potential to tolerate intense rehabilitation     Barriers to Discharge        Equipment Recommendations  None recommended by OT;None recommended by PT    Recommendations for Other Services    Frequency Min 3X/week   Plan Discharge plan remains appropriate;Frequency remains appropriate    Precautions / Restrictions Precautions Precautions: Fall Restrictions Weight Bearing Restrictions: No   Pertinent Vitals/Pain Denies pain.  BP's sitting EOB 150/84 and after standing ~39mins 101/62.      Mobility  Bed Mobility Bed Mobility: Not assessed Transfers Transfers: Sit to Stand;Stand to Sit Sit to Stand: 4: Min assist;With upper extremity assist;From bed Stand to Sit: 4: Min guard;With upper extremity assist;To bed Details for Transfer Assistance: cues for UE use.  pt initially falling back on bed with first sit to stand with only MinG, required MinA to complete sit to stand.   Ambulation/Gait Ambulation/Gait Assistance: Not tested (comment) Stairs: No Wheelchair Mobility Wheelchair Mobility: No    Exercises Other Exercises Other Exercises: pt and son ed on Bil LE ther ex periodically throughout day both seated and supine.     PT Diagnosis:    PT Problem List:   PT Treatment Interventions:     PT Goals Acute  Rehab PT Goals Time For Goal Achievement: 02/04/12 PT Goal: Sit to Stand - Progress: Progressing toward goal PT Goal: Stand to Sit - Progress: Progressing toward goal  Visit Information  Last PT Received On: 01/25/12 Assistance Needed: +1    Subjective Data  Subjective: I didn't sleep as good last night.     Cognition  Overall Cognitive Status: History of cognitive impairments - at baseline Arousal/Alertness: Awake/alert Orientation Level: Appears intact for tasks assessed Behavior During Session: [facility] for tasks performed Cognition - Other Comments: demonstrates some STM loss with recounting history and has difficulty with timeline, looks to son to fill in the blanks    Balance  Balance Balance Assessed: No  End of Session PT - End of Session Equipment Utilized During Treatment: Gait belt Activity Tolerance: Treatment limited secondary to medical complications (Comment) Patient left: in bed;with call bell/phone within reach;with family/visitor present;with bed alarm set (sitting EOB) Nurse Communication: Mobility status (Made aware of pt's BP)   GP     Arlester Keehan, Alison Murray, [place] [phone] 01/25/2012, 8:43 AM

## 2012-01-25 NOTE — Progress Notes (Signed)
[facility]  Subjective: Still some dizziness  Upon standing.  Objective: Vital signs in last 24 hours: Temp:  [97.5 F (36.4 C)-98.3 F (36.8 C)] 97.6 F (36.4 C) (10/25 0832) Pulse Rate:  [70-88] 70  (10/25 0938) Resp:  [15-16] 16  (10/25 0400) BP: (104-183)/(22-98) 104/61 mmHg (10/25 0938) SpO2:  [91 %-96 %] 95 % (10/25 0832) Weight:  [69.627 kg (153 lb 8 oz)] 69.627 kg (153 lb 8 oz) (10/25 0400) Last BM Date: 01/23/12  Intake/Output from previous day: 10/24 0701 - 10/25 0700 In: 360 [P.O.:360] Out: 200 [Urine:200] Intake/Output this shift: Total I/O In: 300 [P.O.:300] Out: 50 [Urine:50]  Medications Current Facility-Administered Medications  Medication Dose Route Frequency Provider Last Rate Last Dose  . acetaminophen (TYLENOL) tablet 650 mg  650 mg Oral Q6H PRN Marinda Elk, MD   650 mg at 01/25/12 0715   Or  . acetaminophen (TYLENOL) suppository 650 mg  650 mg Rectal Q6H PRN Marinda Elk, MD      . amiodarone (PACERONE) tablet 200 mg  200 mg Oral Daily Marinda Elk, MD   200 mg at 01/25/12 0936  . aspirin EC tablet 81 mg  81 mg Oral Daily Marinda Elk, MD   81 mg at 01/25/12 0936  . calcium carbonate (TUMS - dosed in mg elemental calcium) chewable tablet 200 mg of elemental calcium  1 tablet Oral Daily Marinda Elk, MD   200 mg of elemental calcium at 01/25/12 0936  . ciprofloxacin (CIPRO) tablet 500 mg  500 mg Oral BID Marinda Elk, MD   500 mg at 01/25/12 0936  . clopidogrel (PLAVIX) tablet 75 mg  75 mg Oral Daily Marinda Elk, MD   75 mg at 01/25/12 0954  . furosemide (LASIX) tablet 40 mg  40 mg Oral QODAY Ripudeep Jenna Luo, MD   40 mg at 01/24/12 0949  . levothyroxine (SYNTHROID, LEVOTHROID) tablet 75 mcg  75 mcg Oral Daily Marinda Elk, MD   75 mcg at 01/25/12 0936  . losartan (COZAAR) tablet 100 mg  100 mg Oral Daily Marykay Lex, MD   100 mg at 01/25/12 1610  . meclizine  (ANTIVERT) tablet 25 mg  25 mg Oral BID Ripudeep Jenna Luo, MD   25 mg at 01/25/12 9604  . metoprolol (LOPRESSOR) tablet 100 mg  100 mg Oral BID Marinda Elk, MD   100 mg at 01/24/12 2208  . midodrine (PROAMATINE) tablet 2.5 mg  2.5 mg Oral TID WC Wilburt Finlay, PA      . nitroGLYCERIN (NITROSTAT) SL tablet 0.4 mg  0.4 mg Sublingual Q5 min PRN Marinda Elk, MD   0.4 mg at 01/23/12 0108  . ondansetron (ZOFRAN) tablet 4 mg  4 mg Oral Q6H PRN Marinda Elk, MD       Or  . ondansetron [facility]) injection 4 mg  4 mg Intravenous Q6H PRN Marinda Elk, MD      . pantoprazole (PROTONIX) EC tablet 40 mg  40 mg Oral BID Marinda Elk, MD   40 mg at 01/25/12 0954  . polyethylene glycol (MIRALAX / GLYCOLAX) packet 17 g  17 g Oral Daily PRN Marinda Elk, MD   17 g at 01/22/12 2150  . ranolazine (RANEXA) 12 hr tablet 500 mg  500 mg Oral BID Ripudeep Jenna Luo, MD   500 mg at 01/25/12 0937  . simvastatin (ZOCOR) tablet 20 mg  20 mg  Oral QPM Marinda Elk, MD   20 mg at 01/24/12 1645  . sodium chloride 0.9 % injection 3 mL  3 mL Intravenous Q12H Marinda Elk, MD   3 mL at 01/25/12 0955  . traMADol (ULTRAM) tablet 25 mg  25 mg Oral QHS Marinda Elk, MD   25 mg at 01/24/12 2208  . DISCONTD: losartan (COZAAR) tablet 150 mg  150 mg Oral Daily Marinda Elk, MD   150 mg at 01/24/12 0949    PE: General appearance: alert, cooperative and no distress  Lungs: clear to auscultation bilaterally  Heart: regular rate and rhythm, S1, S2 normal, 1/6 murmur, click, rub or gallop  Extremities: No LEE  Pulses: 2+ and symmetric  Neurologic: Grossly normal  Assessment/Plan  Principal Problem:  *SYMPTOM, SYNCOPE AND COLLAPSE Active Problems:  HYPOTHYROIDISM  HYPERTENSION  CORONARY ARTERY DISEASE  UTI (urinary tract infection)  H/O cardiac pacemaker, Medtronic versa implanted 01/2008, though original implanted 2000 for syncope and heart block and ventricular  asystole.  Paroxysmal a-fib, history of, off coumadin since 10/2010, without recurrence of afib.  Acute kidney injury  Vertigo  Diastolic dysfunction, grade two  Plan:  Still orthostatic.  I check BPs myself:  Sitting 129/76, 71; standing 99/64, 78.  I started 2.5mg  of Midodrine TID.  Monitor one more day.     LOS: 5 days    HAGER, BRYAN 01/25/2012 11:52 AM  I have seen and examined the patient along with Wilburt Finlay, PA.  I have reviewed the chart, notes and new data.  I agree with PA's note.  Syncope appears to be caused by severe orthostatic hypotension. She also has angina due to extensive coronary problems, requiring combination antianginal therapy. This makes for challenging and limited therapeutic options. Will reduce AM dose of beta blocker and start AM proamatine. At least as important are behavioral changes: avoid sudden or prolonged orthostasis, stay well hydrated, support stockings, etc. Avoid diuretics unless clearly edematous, SSRIs, alpha blockers.  Thurmon Fair, MD, [facility] [phone] 01/25/2012, 3:55 PM

## 2012-01-25 NOTE — Progress Notes (Signed)
Patient ID: [name] Luna  female  WUJ:[id number]    DOB: [birth year]    DOA: 01/20/2012  PCP: Evette Georges, MD  Subjective: Patient seen today, still feeling very weak upon standing. Orthostatics done by cardiology PA at the same time shows positive orthostasis. Patient and her son are frustrated with the BP/orthostasis.    Objective: Weight change: 0.091 kg (3.2 oz)  Intake/Output Summary (Last 24 hours) at 01/25/12 1335 Last data filed at 01/25/12 0900  Gross per 24 hour  Intake    420 ml  Output    250 ml  Net    170 ml   Blood pressure 104/61, pulse 70, temperature 97.6 F (36.4 C), temperature source Oral, resp. rate 16, height 5\' 4"  (1.626 m), weight 69.627 kg (153 lb 8 oz), SpO2 95.00%.  Physical Exam: General: Alert and awake, oriented, feeling very weak and frustrated today . HEENT: anicteric sclera, pupils reactive to light and accommodation, EOMI CVS: S1-S2 clear Chest: CTA B/L, no wheezing, rales or rhonchi Abdomen: Soft, nontender, nondistended  Extremities: no cyanosis, clubbing or edema noted bilaterally, tremors Skin : Extensive bruising, petechiae and hematoma on arms and back of head, improving  Lab Results: Basic Metabolic Panel:  Lab 01/22/12 9562 01/21/12 0342 01/20/12 2157  NA 141 141 --  K 4.1 3.4* --  CL 105 104 --  CO2 26 29 --  GLUCOSE 99 88 --  BUN 11 15 --  CREATININE 0.75 0.80 --  CALCIUM 9.2 8.8 --  MG -- -- 2.2  PHOS -- -- --   Liver Function Tests:  Lab 01/21/12 0342  AST 23  ALT 17  ALKPHOS 169*  BILITOT 0.6  PROT 6.5  ALBUMIN 3.3*   CBC:  Lab 01/20/12 2157 01/20/12 1301  WBC 7.4 10.8*  NEUTROABS -- 9.2*  HGB 11.3* 12.3  HCT 35.0* 38.2  MCV 91.4 92.3  PLT 166 208   Cardiac Enzymes:  Lab 01/25/12 0620 01/24/12 2112 01/24/12 1900 01/23/12 1400 01/23/12 0906 01/23/12 0200  CKTOTAL 54 59 71 -- -- --  CKMB 2.2 2.3 2.5 -- -- --  CKMBINDEX -- -- -- -- -- --  TROPONINI -- -- -- <0.30 <0.30 <0.30   BNP: No  components found with this basename: POCBNP:2 CBG: No results found for this basename: GLUCAP:5 in the last 168 hours   Micro Results: No results found for this or any previous visit (from the past 240 hour(s)).  Studies/Results: Ct Head Wo Contrast  01/23/2012  *RADIOLOGY REPORT*  Clinical Data: 78 year old female status post fall.  Vertigo and pain.  CT HEAD WITHOUT CONTRAST  Technique:  Contiguous axial images were obtained from the base of the skull through the vertex without contrast.  Comparison: 11/20/2011 and earlier.  Findings: Mildly decreased right superior convexity superficial scalp hematoma.  Underlying calvarium remains intact.  There is new broad-based left posterior lateral scalp soft tissue swelling compatible with a second scalp hematoma (series 3 image 39). Calvarium on this side also appears intact.  No acute orbit soft tissue findings. Calcified atherosclerosis at the skull base.  Visualized paranasal sinuses and mastoids are clear.  Stable cerebral volume.  No ventriculomegaly. No midline shift, mass effect, or evidence of mass lesion.  Bilateral patchy and confluent white matter hypodensity is not significantly changed. No acute intracranial hemorrhage identified.  No evidence of cortically based acute infarction identified.  IMPRESSION: 1.  Bilateral posterior convexity scalp hematomas without underlying fracture. 2. No acute intracranial abnormality.   Original Report  Authenticated By: Harley Hallmark, M.D.    Ct Head Wo Contrast  01/20/2012  *RADIOLOGY REPORT*  Clinical Data: Dizziness and blurry vision  CT HEAD WITHOUT CONTRAST  Technique:  Contiguous axial images were obtained from the base of the skull through the vertex without contrast.  Comparison: 11/07/2011  Findings: There is diffuse patchy low density throughout the subcortical and periventricular white matter consistent with chronic small vessel ischemic change.  There is prominence of the sulci and ventricles  consistent with brain atrophy.  There is no evidence for acute brain infarct, hemorrhage or mass.  There is a right posterior scalp hematoma which measures 7.3 mm in thickness, image 26.  The paranasal sinuses and mastoid air cells appear clear.  The skull appears intact.  IMPRESSION:  1.  No acute intracranial abnormalities. 2.  Small vessel ischemic disease and brain atrophy. 3.  Right posterior scalp hematoma.   Original Report Authenticated By: Rosealee Albee, M.D.    Dg Chest Port 1 View  01/21/2012  *RADIOLOGY REPORT*  Clinical Data: Leukocytosis.  PORTABLE CHEST - 1 VIEW  Comparison: 11/07/2011  Findings: Heart size and appearance of pacemaker are stable.  There is atelectasis/chronic scarring at the right lung base.  No edema or focal consolidation is identified.  No pleural fluid identified.  IMPRESSION: Chronic atelectasis/scarring at the right lung base.  No acute findings.   Original Report Authenticated By: Reola Calkins, M.D.     Medications: Scheduled Meds:    . amiodarone  200 mg Oral Daily  . aspirin EC  81 mg Oral Daily  . calcium carbonate  1 tablet Oral Daily  . ciprofloxacin  500 mg Oral BID  . clopidogrel  75 mg Oral Daily  . furosemide  40 mg Oral QODAY  . levothyroxine  75 mcg Oral Daily  . losartan  100 mg Oral Daily  . meclizine  25 mg Oral BID  . metoprolol  100 mg Oral BID  . midodrine  2.5 mg Oral TID WC  . pantoprazole  40 mg Oral BID  . ranolazine  500 mg Oral BID  . simvastatin  20 mg Oral QPM  . sodium chloride  3 mL Intravenous Q12H  . traMADol  25 mg Oral QHS  . DISCONTD: losartan  150 mg Oral Daily   Continuous Infusions:    Assessment/Plan: Principal Problem:  *SYMPTOM, SYNCOPE AND COLLAPSE/ orthostatic hypotension: Possibly secondary to orthostatic hypotension, UTI versus vertigo  -  this seems to be a recurrent problem every day, started on midodrine by cardiology today  - I have discontinued Lasix   HYPERTENSION: Somewhat  uncontrolled, with her orthostasis and HTN, difficult to keep a balance.   Vertigo: Given worsening of her symptoms last night I ordered a CT head unable to do MRI secondary to pacemaker - CT head repeat shows no acute stroke, new second scalp hematoma, on scheduled meclizine   CAD with chest pain: Patient has no complaints of chest pain ever since started on Ranexa - Per cardiology, she has chronic anginal symptoms. I discussed with patient's son in detail who states that she was on ranolazine which did improve her anginal symptoms but was DC'ed by her cardiologist. I restarted it as it will not affect the BP and was rec'd by Dr Herbie Baltimore on 10/22.    HYPOTHYROIDISM: TSH normal - Continue levothyroxin   UTI (urinary tract infection): on cipro, started on 01/22/2012, I will discontinue it as she has completed 3 days course.  No urine culture was available   H/O cardiac pacemaker, Medtronic versa implanted 01/2008, though original implanted 2000 for syncope and heart block and ventricular asystole.   Paroxysmal a-fib, history of, off coumadin since 10/2010, without recurrence of afib.   Acute kidney injury: resolved  Tremors: No inpatient workup at this point by neurology. Does not appear to be Parkinson's disease.  DVT Prophylaxis: SCD's  Code Status: Full Code  Disposition:    LOS: 5 days   RAI,RIPUDEEP M.D. [facility] 01/25/2012, 1:35 PM Pager: [phone]  If 7PM-7AM, please contact night-coverage [email or web address] Password [email or web address]

## 2012-01-26 DIAGNOSIS — R350 Frequency of micturition: Secondary | ICD-10-CM

## 2012-01-26 LAB — CK TOTAL AND CKMB (NOT AT ARMC)
CK, MB: 2.2 ng/mL (ref 0.3–4.0)
Relative Index: INVALID (ref 0.0–2.5)
Total CK: 51 U/L (ref 7–177)

## 2012-01-26 MED ORDER — METOPROLOL TARTRATE 50 MG PO TABS
50.0000 mg | ORAL_TABLET | Freq: Two times a day (BID) | ORAL | Status: DC
  Filled 2012-01-26 (×2): qty 1

## 2012-01-26 MED ORDER — POLYETHYLENE GLYCOL 3350 17 G PO PACK: 17.0000 g | PACK | Freq: Every day | ORAL | Status: DC | PRN

## 2012-01-26 MED ORDER — METOPROLOL TARTRATE 50 MG PO TABS: 50.0000 mg | ORAL_TABLET | Freq: Two times a day (BID) | ORAL | Status: DC

## 2012-01-26 MED ORDER — LOSARTAN POTASSIUM 50 MG PO TABS: 50.0000 mg | ORAL_TABLET | Freq: Every day | ORAL | Status: DC

## 2012-01-26 MED ORDER — METOPROLOL TARTRATE 50 MG PO TABS
50.0000 mg | ORAL_TABLET | Freq: Every day | ORAL | Status: DC
  Filled 2012-01-26: qty 1

## 2012-01-26 MED ORDER — RANOLAZINE ER 500 MG PO TB12: 500.0000 mg | ORAL_TABLET | Freq: Two times a day (BID) | ORAL | Status: DC

## 2012-01-26 MED ORDER — MIDODRINE HCL 2.5 MG PO TABS: 2.5000 mg | ORAL_TABLET | Freq: Three times a day (TID) | ORAL | Status: DC

## 2012-01-26 MED ORDER — FUROSEMIDE 40 MG PO TABS: 20.0000 mg | ORAL_TABLET | ORAL | Status: DC | PRN

## 2012-01-26 MED ORDER — METOPROLOL TARTRATE 25 MG PO TABS
25.0000 mg | ORAL_TABLET | Freq: Every day | ORAL | Status: DC
  Filled 2012-01-26: qty 1

## 2012-01-26 NOTE — Progress Notes (Signed)
Physical Therapy Treatment Patient Details Name: [name] Luna MRN: [id number] DOB: [birth year] Today's Date: 01/26/2012 Time: 4098-1191 PT Time Calculation (min): 19 min  PT Assessment / Plan / Recommendation Comments on Treatment Session  Patient having increased difficulty with gait and balance today.  Spoke with patient and family - patient needs physical assist when up ambulating for safety.  Will have HHPT for continued therapy for mobility and balance.    Follow Up Recommendations  Home health PT;Supervision/Assistance - 24 hour     Does the patient have the potential to tolerate intense rehabilitation     Barriers to Discharge        Equipment Recommendations  None recommended by PT    Recommendations for Other Services    Frequency Min 3X/week   Plan Discharge plan remains appropriate;Frequency remains appropriate    Precautions / Restrictions Precautions Precautions: Fall Restrictions Weight Bearing Restrictions: No   Pertinent Vitals/Pain BP: 133/70 sitting;  105/61 in standing after gait    Mobility  Transfers Transfers: Sit to Stand;Stand to Sit Sit to Stand: 4: Min assist;With upper extremity assist;With armrests;From chair/3-in-1 Stand to Sit: 4: Min assist;With upper extremity assist;With armrests;To chair/3-in-1 Details for Transfer Assistance: Verbal and tactile cues for hand placement.  Assist to shift weight forward over feet to stand - patient leaning posteriorly. Ambulation/Gait Ambulation/Gait Assistance: 3: Mod assist Ambulation Distance (Feet): 72 Feet Assistive device: Rolling walker Ambulation/Gait Assistance Details: Patient with significant posterior lean, losing balance on 4 occasions during gait.  Patient required mod assist to advance and maneuver RW.   Gait Pattern: Step-through pattern;Decreased step length - right;Decreased step length - left;Shuffle;Trunk flexed Gait velocity: decreased Stairs: No      PT Goals Acute Rehab PT  Goals PT Goal: Sit to Stand - Progress: Progressing toward goal PT Goal: Stand to Sit - Progress: Progressing toward goal PT Goal: Ambulate - Progress: Progressing toward goal  Visit Information  Last PT Received On: 01/26/12 Assistance Needed: +1    Subjective Data  Subjective: "I feel weak and a little dizzy" (Following ambulation)   Cognition  Overall Cognitive Status: History of cognitive impairments - at baseline Arousal/Alertness: Awake/alert Orientation Level: Appears intact for tasks assessed Behavior During Session: [facility] for tasks performed    Balance  Balance Balance Assessed: Yes Static Standing Balance Static Standing - Balance Support: Right upper extremity supported;Left upper extremity supported Static Standing - Level of Assistance: 4: Min assist Static Standing - Comment/# of Minutes: Patient required min assist to maintain balance in standing - posterior lean.  Stood x 3 minutes.  End of Session PT - End of Session Equipment Utilized During Treatment: Gait belt Activity Tolerance: Patient limited by fatigue (Limited by feeling of dizziness during gait.) Patient left: in chair;with call bell/phone within reach;with family/visitor present;with nursing in room Nurse Communication: Mobility status (BP during session)   GP     Vena Austria 01/26/2012, 2:32 PM Durenda Hurt. Renaldo Fiddler, [facility] Pager [phone]

## 2012-01-26 NOTE — Discharge Summary (Signed)
Physician Discharge Summary  Patient ID: [name] Luna MRN: [id number] DOB/AGE: 76-May-1922 76 y.o.  Admit date: 01/20/2012 Discharge date: 01/26/2012  Primary Care Physician:  Evette Georges, MD  Discharge Diagnoses:    .SYMPTOM, SYNCOPE AND COLLAPSE likely secondary to orthostatic hypotension worsened by UTI and vertigo  .UTI (urinary tract infection), completed course of antibiotics .HYPOTHYROIDISM .HYPERTENSION .CORONARY ARTERY DISEASE with chronic anginal symptoms .Acute kidney injury .Paroxysmal a-fib, history of, off coumadin since 10/2010, without recurrence of afib. .Vertigo .Diastolic dysfunction, grade two: compensated Benign essential tremor  Consults:  [facility], Dr. Herbie Baltimore                    Neurology, Dr Thad Ranger     Discharge Medications:   Medication List     As of 01/26/2012 10:28 AM    STOP taking these medications         potassium chloride SA 20 MEQ tablet   Commonly known as: K-DUR,KLOR-CON      TAKE these medications         amiodarone 200 MG tablet   Commonly known as: PACERONE   Take 200 mg by mouth daily.      aspirin 81 MG EC tablet   Take 81 mg by mouth daily.      calcium carbonate 500 MG chewable tablet   Commonly known as: TUMS - dosed in mg elemental calcium   Chew 1 tablet by mouth daily.      clopidogrel 75 MG tablet   Commonly known as: PLAVIX   Take 75 mg by mouth daily.      furosemide 40 MG tablet   Commonly known as: LASIX   Take 0.5 tablets (20 mg total) by mouth as needed. For increase in weight >5lbs or shortness of breath      levothyroxine 75 MCG tablet   Commonly known as: SYNTHROID, LEVOTHROID   Take 75 mcg by mouth daily.      loratadine 10 MG tablet   Commonly known as: CLARITIN   Take 10 mg by mouth daily.      losartan 50 MG tablet   Commonly known as: COZAAR   Take 1 tablet (50 mg total) by mouth daily.      meclizine 25 MG tablet   Commonly known as: ANTIVERT   Take 1  tablet (25 mg total) by mouth 2 (two) times daily. Also available over-the-counter.      metoprolol 50 MG tablet   Commonly known as: LOPRESSOR   Take 1 tablet (50 mg total) by mouth 2 (two) times daily.      midodrine 2.5 MG tablet   Commonly known as: PROAMATINE   Take 1 tablet (2.5 mg total) by mouth 3 (three) times daily with meals.      multivitamin capsule   Take 1 capsule by mouth daily.      nitroGLYCERIN 0.4 MG SL tablet   Commonly known as: NITROSTAT   Place 0.4 mg under the tongue every 5 (five) minutes as needed.      pantoprazole 40 MG tablet   Commonly known as: PROTONIX   Take 40 mg by mouth 2 (two) times daily.      polyethylene glycol packet   Commonly known as: MIRALAX / GLYCOLAX   Take 17 g by mouth daily as needed (constipation).      PreserVision/Lutein Caps   Take by mouth 2 (two) times daily.      ranolazine 500 MG 12 hr tablet  Commonly known as: RANEXA   Take 1 tablet (500 mg total) by mouth 2 (two) times daily.      simvastatin 20 MG tablet   Commonly known as: ZOCOR   Take 20 mg by mouth every evening.      traMADol 50 MG tablet   Commonly known as: ULTRAM   Take 25 mg by mouth at bedtime.         Brief H and P: For complete details please refer to admission H and P, but in brief, 78 year old female with past medical history of hypertension coronary artery C. status post PCI with the S. in 2008 repeated a drug-eluting stent in 2011 with hypertension, heart failure hypothyroidism, status post pacemaker presented with syncopal episode. Patient's son accompanying her related that she was in her usual state on chronic when she suddenly felt dizzy and fell to the floor. No other prodromal symptoms.    Hospital Course:  Patient is 78 year old female with chronic cardiac issues who was admitted with a syncopal episode. During the hospitalization patient had recurrent problem with orthostatic hypotension worsening with vertigo and UTI.  SYMPTOM,  SYNCOPE AND COLLAPSE: Possibly secondary to orthostatic hypotension which appears to be a recurrent issue and worsened with UTI and vertigo. [facility] cardiology followed the patient throughout, several changes were made to her antihypertensives. Given her extensive coronary problems with chronic angina and orthostatic hypotension, it was somewhat challenging balance and limited therapeutic options. Patient was started on midodrine by cardiology. I started her back on Ranexa as patient was not able to tolerate sublingual nitroglycerin which did improve her chest pain however patient became very orthostatic and dizzy after the nitroglycerin. Eventually losartan and metoprolol doses were reduced, instructions were given to the patient's son to use Lasix PRN with weight gain or if patient is symptomatic with dyspnea or edema.  HYPERTENSION: Somewhat uncontrolled, with her orthostasis and HTN, difficult to keep a balance, please refer to #1.   Vertigo: Given worsening of her symptoms in the hospitalization, CT head was repeated. We were unable to do MRI secondary to pacemaker. CT head repeat showed no acute stroke, new second scalp hematoma, patient was placed on scheduled meclizine with improvement in her symptoms.  CAD with chest pain: Patient has no complaints of chest pain ever since started on Ranexa. I discussed with patient's son in detail who states that she was on ranolazine which did improve her anginal symptoms but was DC'ed by her cardiologist. I restarted it as it will not affect the BP and was rec'd by Dr Herbie Baltimore on 10/22.   HYPOTHYROIDISM: TSH normal, continued on levothyroxin   UTI (urinary tract infection): on cipro, started on 01/22/2012, patient has completed the course.   Tremors : No inpatient workup at this point by neurology. Does not appear to be Parkinson's disease.   Day of Discharge BP 101/59  Pulse 75  Temp 98.3 F (36.8 C) (Oral)  Resp 15  Ht 5\' 4"  (1.626 m)  Wt  66.134 kg (145 lb 12.8 oz)  BMI 25.03 kg/m2  SpO2 93%  Physical Exam: General: Alert and awake oriented x3 not in any acute distress. HEENT: anicteric sclera, pupils reactive to light and accommodation CVS: S1-S2 clear no murmur rubs or gallops Chest: clear to auscultation bilaterally, no wheezing rales or rhonchi Abdomen: soft nontender, nondistended, normal bowel sounds, no organomegaly Extremities: no cyanosis, clubbing or edema noted bilaterally Neuro: Cranial nerves II-XII intact, no focal neurological deficits   The results of significant  diagnostics from this hospitalization (including imaging, microbiology, ancillary and laboratory) are listed below for reference.    LAB RESULTS: Basic Metabolic Panel:  Lab 01/22/12 8119 01/21/12 0342 01/20/12 2157  NA 141 141 --  K 4.1 3.4* --  CL 105 104 --  CO2 26 29 --  GLUCOSE 99 88 --  BUN 11 15 --  CREATININE 0.75 0.80 --  CALCIUM 9.2 8.8 --  MG -- -- 2.2  PHOS -- -- --   Liver Function Tests:  Lab 01/21/12 0342  AST 23  ALT 17  ALKPHOS 169*  BILITOT 0.6  PROT 6.5  ALBUMIN 3.3*   CBC:  Lab 01/20/12 2157 01/20/12 1301  WBC 7.4 10.8*  NEUTROABS -- 9.2*  HGB 11.3* 12.3  HCT 35.0* 38.2  MCV 91.4 --  PLT 166 208   Cardiac Enzymes:  Lab 01/26/12 0620 01/25/12 2043 01/23/12 1400 01/23/12 0906  CKTOTAL 51 57 -- --  CKMB 2.2 2.1 -- --  CKMBINDEX -- -- -- --  TROPONINI -- -- <0.30 <0.30    Significant Diagnostic Studies:  Ct Head Wo Contrast  01/20/2012  *RADIOLOGY REPORT*  Clinical Data: Dizziness and blurry vision  CT HEAD WITHOUT CONTRAST  Technique:  Contiguous axial images were obtained from the base of the skull through the vertex without contrast.  Comparison: 11/07/2011  Findings: There is diffuse patchy low density throughout the subcortical and periventricular white matter consistent with chronic small vessel ischemic change.  There is prominence of the sulci and ventricles consistent with brain atrophy.   There is no evidence for acute brain infarct, hemorrhage or mass.  There is a right posterior scalp hematoma which measures 7.3 mm in thickness, image 26.  The paranasal sinuses and mastoid air cells appear clear.  The skull appears intact.  IMPRESSION:  1.  No acute intracranial abnormalities. 2.  Small vessel ischemic disease and brain atrophy. 3.  Right posterior scalp hematoma.   Original Report Authenticated By: Rosealee Albee, M.D.    Dg Chest Port 1 View  01/21/2012  *RADIOLOGY REPORT*  Clinical Data: Leukocytosis.  PORTABLE CHEST - 1 VIEW  Comparison: 11/07/2011  Findings: Heart size and appearance of pacemaker are stable.  There is atelectasis/chronic scarring at the right lung base.  No edema or focal consolidation is identified.  No pleural fluid identified.  IMPRESSION: Chronic atelectasis/scarring at the right lung base.  No acute findings.   Original Report Authenticated By: Reola Calkins, M.D.      Disposition and Follow-up:     Discharge Orders    Future Orders Please Complete By Expires   Diet - low sodium heart healthy      Increase activity slowly      (HEART FAILURE PATIENTS) Call MD:  Anytime you have any of the following symptoms: 1) 3 pound weight gain in 24 hours or 5 pounds in 1 week 2) shortness of breath, with or without a dry hacking cough 3) swelling in the hands, feet or stomach 4) if you have to sleep on extra pillows at night in order to breathe.      Discharge instructions      Comments:   Please take lasix as needed with CHF instructions (weight up >5lbs in 1 week or >3lbs in 24hrs or shortness of breath, swelling in legs/feet). Avoid sudden standing up, keep legs elevated when in chair or bed.       DISPOSITION: Home with PT, RN DIET: heart healthy  ACTIVITY: as tolerated  DISCHARGE FOLLOW-UP Follow-up Information    Follow up with Governor Rooks, MD. On 02/14/2012. (at 11:00 AM)    Contact information:   [address]  [place] [address] [phone]       Follow up with TODD,JEFFREY Freida Busman, MD. Schedule an appointment as soon as possible for a visit in 10 days. (for hospital follow-up)    Contact information:   [address] Christena Flake [facility] [place] [address] [phone]          Time spent on Discharge:  Signed:   Eliah Ozawa M.D. [facility] 01/26/2012, 10:28 AM Pager: [phone]

## 2012-01-26 NOTE — Progress Notes (Signed)
I briefly saw the patient this AM -- finally not orthostatic this AM with relatively low baseline BP.  Midodrine started yesterday for significant (vasodepressor) orthostatic hypotension. BB dose backed off to 50mg  BID & Losartan down to 50mg  to allow for "permissive HTN" which is mitigated by addition of Ranexa for anti-Anginal Rx.    If she is able to be up & about later today with no significant BP drop or other untoward Sx, I agree that she is ready for d/c.   Will need close ROV @ [facility] with Dr. Cloyde Reams or PA/NP.  Marykay Lex, M.D., M.S. [facility] [address] [place], [place]  [address]  [phone] Pager # [phone] 01/26/2012 11:48 AM

## 2012-01-29 ENCOUNTER — Telehealth: Payer: Self-pay | Admitting: Family Medicine

## 2012-01-29 NOTE — Telephone Encounter (Signed)
[name],  Please see note below and let me know when you would like the patient to come in for OV. Thanks!    [address] [place], [place] [address] p. [phone] f. [phone] To: [facility] (After Hours Triage) Fax: [phone] From: [facility] Date/ Time: 01/28/2012 5:10 PM Taken By: Jethro BolusStanton Kidney Facility: [facility] Patient: [name] Luna, [name] Luna DOB: Mar 06, 1921 Phone: [phone] Reason for Call: Patient was discharged from hospital on 10/25 or 10/26, admitted under Cardiology services for Syncope. Has had some medication changes. Son states that she has been hospitalized x3 times this year and each time has had a UTI. Home health has already been started. Needs an appt. with Dr. Tawanna Cooler and Dr. Tawanna Cooler needs to sign her plan of care. Message left by Conan Bowens with [facility]. Regarding Appointment:

## 2012-01-29 NOTE — Telephone Encounter (Signed)
Appt made. Talked w/home care nurse and [facility] for Peyton Najjar (son) w/ appt info. Home care will fax Dr. Tawanna Cooler her Plan of Care for signature.

## 2012-01-29 NOTE — Telephone Encounter (Signed)
12:15 Thursday ..please allow 30 minutes for the appointment

## 2012-01-31 ENCOUNTER — Ambulatory Visit (INDEPENDENT_AMBULATORY_CARE_PROVIDER_SITE_OTHER): Payer: Medicare Other | Admitting: Family Medicine

## 2012-01-31 ENCOUNTER — Encounter: Payer: Self-pay | Admitting: Family Medicine

## 2012-01-31 VITALS — BP 160/80 | HR 84 | Temp 98.1°F | Wt 151.0 lb

## 2012-01-31 DIAGNOSIS — R82998 Other abnormal findings in urine: Secondary | ICD-10-CM

## 2012-01-31 DIAGNOSIS — I10 Essential (primary) hypertension: Secondary | ICD-10-CM

## 2012-01-31 DIAGNOSIS — R8290 Unspecified abnormal findings in urine: Secondary | ICD-10-CM

## 2012-01-31 MED ORDER — NITROFURANTOIN MONOHYD MACRO 100 MG PO CAPS: ORAL_CAPSULE | ORAL | Status: DC

## 2012-01-31 NOTE — Progress Notes (Signed)
  Subjective:    Patient ID: [name] Luna, female    DOB: [birth year], 78 y.o.   MRN: [id number]  HPIw. Is a 78 year old female who comes in today accompanied by her son who now is her 7 7 caregiver for followup of a hospitalization from October 20 through the 26th for evaluation of a syncopal episode  Has noticed in her problem list she has multiple issues  She developed low blood pressure. During the hospital stay they stopped her Lasix. BP now is in the 160/80 range. I explained to the son that I would keep her blood pressure in this range. I would continue to hold the diuretics. Weight stable 151  She also has a history of recurrent urinary tract infections and he was to know if there's anything else we can do to prevent these. She had a UTI in the hospital with treated successfully with oral Cipro    Review of Systems   general and cardiovascular review of systems otherwise negative Objective:   Physical Exam Well-developed well-nourished female in no acute distress       Assessment & Plan:  Hypertension would maintain current blood pressure hold diuretics  Recurrent UTIs

## 2012-01-31 NOTE — Patient Instructions (Signed)
I would discontinue the Lasix completely  Macrobid,,,,,,,,,, 1 daily for 6 weeks to prevent UTIs  Return when necessary

## 2012-02-06 ENCOUNTER — Emergency Department (HOSPITAL_COMMUNITY): Payer: Medicare Other

## 2012-02-06 ENCOUNTER — Encounter (HOSPITAL_COMMUNITY): Payer: Self-pay

## 2012-02-06 ENCOUNTER — Inpatient Hospital Stay (HOSPITAL_COMMUNITY)
Admission: EM | Admit: 2012-02-06 | Discharge: 2012-02-15 | DRG: 312 | Disposition: A | Discharge status: Skilled Nursing Facility | Payer: Medicare Other | Attending: Cardiology | Admitting: Cardiology

## 2012-02-06 ENCOUNTER — Telehealth: Payer: Self-pay | Admitting: Family Medicine

## 2012-02-06 DIAGNOSIS — I959 Hypotension, unspecified: Secondary | ICD-10-CM | POA: Diagnosis present

## 2012-02-06 DIAGNOSIS — I2510 Atherosclerotic heart disease of native coronary artery without angina pectoris: Secondary | ICD-10-CM | POA: Diagnosis present

## 2012-02-06 DIAGNOSIS — N390 Urinary tract infection, site not specified: Secondary | ICD-10-CM | POA: Diagnosis present

## 2012-02-06 DIAGNOSIS — E039 Hypothyroidism, unspecified: Secondary | ICD-10-CM | POA: Diagnosis present

## 2012-02-06 DIAGNOSIS — K219 Gastro-esophageal reflux disease without esophagitis: Secondary | ICD-10-CM | POA: Diagnosis present

## 2012-02-06 DIAGNOSIS — Z950 Presence of cardiac pacemaker: Secondary | ICD-10-CM

## 2012-02-06 DIAGNOSIS — Z79899 Other long term (current) drug therapy: Secondary | ICD-10-CM

## 2012-02-06 DIAGNOSIS — I5189 Other ill-defined heart diseases: Secondary | ICD-10-CM | POA: Diagnosis present

## 2012-02-06 DIAGNOSIS — I480 Paroxysmal atrial fibrillation: Secondary | ICD-10-CM | POA: Diagnosis present

## 2012-02-06 DIAGNOSIS — I4891 Unspecified atrial fibrillation: Secondary | ICD-10-CM | POA: Diagnosis present

## 2012-02-06 DIAGNOSIS — R55 Syncope and collapse: Principal | ICD-10-CM | POA: Diagnosis present

## 2012-02-06 DIAGNOSIS — I10 Essential (primary) hypertension: Secondary | ICD-10-CM | POA: Diagnosis present

## 2012-02-06 DIAGNOSIS — Z7982 Long term (current) use of aspirin: Secondary | ICD-10-CM

## 2012-02-06 DIAGNOSIS — R110 Nausea: Secondary | ICD-10-CM | POA: Diagnosis present

## 2012-02-06 HISTORY — DX: Pneumonia, unspecified organism: J18.9

## 2012-02-06 HISTORY — DX: Nonexudative age-related macular degeneration, unspecified eye, stage unspecified: H35.3190

## 2012-02-06 LAB — URINALYSIS, ROUTINE W REFLEX MICROSCOPIC
Leukocytes, UA: NEGATIVE
Protein, ur: NEGATIVE mg/dL
Specific Gravity, Urine: 1.007 (ref 1.005–1.030)
Urobilinogen, UA: 0.2 mg/dL (ref 0.0–1.0)

## 2012-02-06 LAB — COMPREHENSIVE METABOLIC PANEL
ALT: 22 U/L (ref 0–35)
Albumin: 4 g/dL (ref 3.5–5.2)
Alkaline Phosphatase: 209 U/L — ABNORMAL HIGH (ref 39–117)
BUN: 14 mg/dL (ref 6–23)
Calcium: 9.6 mg/dL (ref 8.4–10.5)
GFR calc Af Amer: 74 mL/min — ABNORMAL LOW (ref >90)
Glucose, Bld: 95 mg/dL (ref 70–99)
Potassium: 3.9 mEq/L (ref 3.5–5.1)
Sodium: 141 mEq/L (ref 135–145)
Total Protein: 7.6 g/dL (ref 6.0–8.3)

## 2012-02-06 LAB — CBC WITH DIFFERENTIAL/PLATELET
Basophils Relative: 0 % (ref 0–1)
Eosinophils Absolute: 0.1 10*3/uL (ref 0.0–0.7)
Eosinophils Relative: 1 % (ref 0–5)
Lymphs Abs: 0.9 10*3/uL (ref 0.7–4.0)
MCH: 30.3 pg (ref 26.0–34.0)
MCHC: 33.3 g/dL (ref 30.0–36.0)
MCV: 91 fL (ref 78.0–100.0)
Neutrophils Relative %: 81 % — ABNORMAL HIGH (ref 43–77)
Platelets: 241 10*3/uL (ref 150–400)

## 2012-02-06 LAB — URINE MICROSCOPIC-ADD ON

## 2012-02-06 LAB — PROTIME-INR: Prothrombin Time: 12.2 seconds (ref 11.6–15.2)

## 2012-02-06 LAB — TROPONIN I: Troponin I: <0.3 ng/mL (ref <0.30)

## 2012-02-06 MED ORDER — METOPROLOL TARTRATE 25 MG PO TABS: 50.0000 mg | ORAL_TABLET | Freq: Two times a day (BID) | ORAL | Status: DC

## 2012-02-06 MED ORDER — LEVOTHYROXINE SODIUM 75 MCG PO TABS
75.0000 ug | ORAL_TABLET | Freq: Every day | ORAL | Status: DC
  Administered 2012-02-07 – 2012-02-15 (×9): 75 ug via ORAL
  Filled 2012-02-06 (×11): qty 1

## 2012-02-06 MED ORDER — PANTOPRAZOLE SODIUM 40 MG PO TBEC
40.0000 mg | DELAYED_RELEASE_TABLET | Freq: Two times a day (BID) | ORAL | Status: DC
  Administered 2012-02-06 – 2012-02-15 (×18): 40 mg via ORAL
  Filled 2012-02-06 (×17): qty 1

## 2012-02-06 MED ORDER — CALCIUM CARBONATE ANTACID 500 MG PO CHEW
1.0000 | CHEWABLE_TABLET | Freq: Every day | ORAL | Status: DC
  Administered 2012-02-07 – 2012-02-15 (×9): 200 mg via ORAL
  Filled 2012-02-06 (×10): qty 1

## 2012-02-06 MED ORDER — SODIUM CHLORIDE 0.9 % IJ SOLN: 3.0000 mL | INTRAMUSCULAR | Status: DC | PRN

## 2012-02-06 MED ORDER — MECLIZINE HCL 25 MG PO TABS
25.0000 mg | ORAL_TABLET | Freq: Two times a day (BID) | ORAL | Status: DC | PRN
  Filled 2012-02-06: qty 1

## 2012-02-06 MED ORDER — METOPROLOL TARTRATE 25 MG PO TABS
50.0000 mg | ORAL_TABLET | Freq: Two times a day (BID) | ORAL | Status: DC
  Administered 2012-02-06 – 2012-02-07 (×3): 50 mg via ORAL
  Filled 2012-02-06 (×6): qty 2

## 2012-02-06 MED ORDER — SODIUM CHLORIDE 0.9 % IV SOLN: 250.0000 mL | INTRAVENOUS | Status: DC | PRN

## 2012-02-06 MED ORDER — POLYETHYLENE GLYCOL 3350 17 G PO PACK
17.0000 g | PACK | Freq: Every day | ORAL | Status: DC | PRN
  Administered 2012-02-11: 17 g via ORAL
  Filled 2012-02-06: qty 1

## 2012-02-06 MED ORDER — CLOPIDOGREL BISULFATE 75 MG PO TABS
75.0000 mg | ORAL_TABLET | Freq: Every day | ORAL | Status: DC
  Administered 2012-02-07 – 2012-02-15 (×9): 75 mg via ORAL
  Filled 2012-02-06 (×9): qty 1

## 2012-02-06 MED ORDER — LOSARTAN POTASSIUM 50 MG PO TABS: 50.0000 mg | ORAL_TABLET | Freq: Every day | ORAL | Status: DC

## 2012-02-06 MED ORDER — LORATADINE 10 MG PO TABS
10.0000 mg | ORAL_TABLET | Freq: Every day | ORAL | Status: DC
  Administered 2012-02-07 – 2012-02-15 (×9): 10 mg via ORAL
  Filled 2012-02-06 (×10): qty 1

## 2012-02-06 MED ORDER — MIDODRINE HCL 2.5 MG PO TABS
2.5000 mg | ORAL_TABLET | Freq: Three times a day (TID) | ORAL | Status: DC
  Filled 2012-02-06 (×4): qty 1

## 2012-02-06 MED ORDER — TRAMADOL HCL 50 MG PO TABS
25.0000 mg | ORAL_TABLET | Freq: Every day | ORAL | Status: DC
  Administered 2012-02-06 – 2012-02-14 (×9): 25 mg via ORAL
  Filled 2012-02-06 (×9): qty 1

## 2012-02-06 MED ORDER — SIMVASTATIN 20 MG PO TABS
20.0000 mg | ORAL_TABLET | Freq: Every evening | ORAL | Status: DC
  Administered 2012-02-06 – 2012-02-14 (×9): 20 mg via ORAL
  Filled 2012-02-06 (×10): qty 1

## 2012-02-06 MED ORDER — RANOLAZINE ER 500 MG PO TB12
500.0000 mg | ORAL_TABLET | Freq: Two times a day (BID) | ORAL | Status: DC
  Administered 2012-02-06 – 2012-02-15 (×18): 500 mg via ORAL
  Filled 2012-02-06 (×19): qty 1

## 2012-02-06 MED ORDER — SODIUM CHLORIDE 0.9 % IJ SOLN
3.0000 mL | Freq: Two times a day (BID) | INTRAMUSCULAR | Status: DC
  Administered 2012-02-06 – 2012-02-15 (×16): 3 mL via INTRAVENOUS

## 2012-02-06 MED ORDER — ASPIRIN 81 MG PO CHEW
81.0000 mg | CHEWABLE_TABLET | Freq: Every day | ORAL | Status: DC
  Administered 2012-02-07 – 2012-02-15 (×9): 81 mg via ORAL
  Filled 2012-02-06 (×12): qty 1

## 2012-02-06 MED ORDER — LOSARTAN POTASSIUM 25 MG PO TABS
25.0000 mg | ORAL_TABLET | Freq: Two times a day (BID) | ORAL | Status: DC
  Administered 2012-02-06 – 2012-02-09 (×6): 25 mg via ORAL
  Filled 2012-02-06 (×10): qty 1

## 2012-02-06 MED ORDER — AMIODARONE HCL 200 MG PO TABS
200.0000 mg | ORAL_TABLET | Freq: Every day | ORAL | Status: DC
  Administered 2012-02-07 – 2012-02-15 (×9): 200 mg via ORAL
  Filled 2012-02-06 (×10): qty 1

## 2012-02-06 MED ORDER — NITROFURANTOIN MONOHYD MACRO 100 MG PO CAPS
100.0000 mg | ORAL_CAPSULE | Freq: Every day | ORAL | Status: DC
  Administered 2012-02-07 – 2012-02-15 (×8): 100 mg via ORAL
  Filled 2012-02-06 (×10): qty 1

## 2012-02-06 NOTE — ED Notes (Signed)
Consulting physician and cardiology MD at the bedside.

## 2012-02-06 NOTE — ED Notes (Signed)
Pt reports starting antibiotic last Thursday for "bladder infection"

## 2012-02-06 NOTE — ED Notes (Signed)
Report received from [place], [place].  Assumed care of patient at this time.

## 2012-02-06 NOTE — ED Notes (Signed)
Food and beverage provided.

## 2012-02-06 NOTE — ED Notes (Signed)
Attempted to call report. Will call back after shift change to attempt.

## 2012-02-06 NOTE — H&P (Addendum)
PCP:   Evette Georges, MD   Chief Complaint:  Passed out  HPI: 78 year old female with past medical history of hypertension, coronary artery disease, hypertension, heart failure, hypothyroidism, PAF. status post pacemaker that came to the hospital for episode of passing out this morning. Patient was sitting at the breakfast and passed out, though she was able to respond to his questions. Patient 's BP per  son was 70/40. Patient has h/o orthostatic hypotension and is on multiple antihypertensive medications and also on midodrine. She was recently  discharged with similar complaints on 01/26/12. Patient at this time is alert, denies any symptoms at this time.   Allergies:   Allergies  Allergen Reactions  . Codeine Other (See Comments)    Passes out  . Isosorbide Mononitrate     REACTION: fainted  . Lorazepam Other (See Comments)    hallucinations  . Prednisone Other (See Comments)    nausea  . Valium (Diazepam) Other (See Comments)    Reaction unknown  . Penicillins Rash      Past Medical History  Diagnosis Date  . Hypertension   . CHF (congestive heart failure)   . A-fib   . Hypothyroid   . GERD (gastroesophageal reflux disease)   . Hypothyroidism   . Bradycardia   . Pancreatitis   . Coronary artery disease   . Shingles   . CORONARY ARTERY DISEASE 11/28/2006    Qualifier: Diagnosis of  By: Tawanna Cooler MD, Eugenio Hoes   . Pacemaker   . Paroxysmal a-fib, history of 08/02/2011  . Tremor, essential 08/02/2011  . Esophageal disorder, with history of dilatation 08/02/2011  . Angina   . H/O hiatal hernia   . Neuromuscular disorder     essential tremors  . UTI (lower urinary tract infection) 08/03/2011  . H/O cardiac pacemaker, Medtronic versa implanted 01/2008, though original implanted 2000 for syncope and heart block and ventricular asystole. 08/02/2011  . Dizziness, near syncope 11/07/2011  . HTN (hypertension), accelerated this admit 11/08/2011    Past Surgical History  Procedure  Date  . Permanent pacemaker   . S/p total hyserectomy and left oophorectomy   . S/p cholecystectomy   . Repair spigelian hernia   . S/p childbirth     x2  . S/p cataract extraction with lens implant   . S/p appendectomy   . Splint 2008  . Coronary angioplasty   . Insert / replace / remove pacemaker   . Cholecystectomy   . Appendectomy     Prior to Admission medications   Medication Sig Start Date End Date Taking? Authorizing Provider  amiodarone (PACERONE) 200 MG tablet Take 200 mg by mouth daily.     Yes Historical Provider, MD  aspirin 81 MG EC tablet Take 81 mg by mouth daily.     Yes Historical Provider, MD  calcium carbonate (TUMS - DOSED IN MG ELEMENTAL CALCIUM) 500 MG chewable tablet Chew 1 tablet by mouth daily.    Yes Historical Provider, MD  clopidogrel (PLAVIX) 75 MG tablet Take 75 mg by mouth daily.     Yes Historical Provider, MD  levothyroxine (SYNTHROID, LEVOTHROID) 75 MCG tablet Take 75 mcg by mouth daily.   Yes Historical Provider, MD  loratadine (CLARITIN) 10 MG tablet Take 10 mg by mouth daily.     Yes Historical Provider, MD  losartan (COZAAR) 50 MG tablet Take 50 mg by mouth daily. 01/26/12  Yes Ripudeep Jenna Luo, MD  meclizine (ANTIVERT) 25 MG tablet Take 25 mg by mouth  2 (two) times daily as needed. For dizziness 01/24/12  Yes Ripudeep Jenna Luo, MD  metoprolol (LOPRESSOR) 50 MG tablet Take 50 mg by mouth 2 (two) times daily. 01/26/12  Yes Ripudeep Jenna Luo, MD  midodrine (PROAMATINE) 2.5 MG tablet Take 2.5 mg by mouth 3 (three) times daily with meals. 01/26/12  Yes Ripudeep Jenna Luo, MD  Multiple Vitamin (MULTIVITAMIN) capsule Take 1 capsule by mouth daily.     Yes Historical Provider, MD  Multiple Vitamins-Minerals (PRESERVISION/LUTEIN) CAPS Take by mouth 2 (two) times daily.     Yes Historical Provider, MD  nitrofurantoin, macrocrystal-monohydrate, (MACROBID) 100 MG capsule Take 100 mg by mouth daily. For 6 weeks  Started 02/02/2012 01/31/12  Yes Roderick Pee, MD    nitroGLYCERIN (NITROSTAT) 0.4 MG SL tablet Place 0.4 mg under the tongue every 5 (five) minutes as needed. For chest pain   Yes Historical Provider, MD  pantoprazole (PROTONIX) 40 MG tablet Take 40 mg by mouth 2 (two) times daily.   Yes Historical Provider, MD  polyethylene glycol (MIRALAX / GLYCOLAX) packet Take 17 g by mouth daily as needed. For constipation 01/26/12  Yes Ripudeep Jenna Luo, MD  ranolazine (RANEXA) 500 MG 12 hr tablet Take 500 mg by mouth 2 (two) times daily. 01/26/12  Yes Ripudeep Jenna Luo, MD  simvastatin (ZOCOR) 20 MG tablet Take 20 mg by mouth every evening.   Yes Historical Provider, MD  traMADol (ULTRAM) 50 MG tablet Take 25 mg by mouth at bedtime.    Yes Historical Provider, MD    Social History:  reports that she has never smoked. She has never used smokeless tobacco. She reports that she does not drink alcohol or use illicit drugs.  Family History  Problem Relation Age of Onset  . Sudden death Mother     Review of Systems:  HEENT: Denies headache, has h/o macular degeneration, no  runny nose, sore throat,  Neck: Denies thyroid problems,lymphadenopathy Chest : Denies shortness of breath, no history of COPD Heart : Denies Chest pain,  coronary arterey disease GI: Denies  nausea, vomiting, diarrhea, constipation GU: Denies dysuria, urgency, frequency of urination, hematuria Neuro: Denies stroke, seizures, syncope    Physical Exam: Blood pressure 187/61, pulse 71, temperature 99.1 F (37.3 C), resp. rate 16, SpO2 96.00%. Constitutional:   Patient is a well-developed and well-nourished  female in no acute distress and cooperative with exam. Head: Normocephalic and atraumatic Mouth: Mucus membranes moist Eyes: PERRL, EOMI, conjunctivae normal Neck: Supple, No Thyromegaly Cardiovascular: RRR, S1 normal, S2 normal Pulmonary/Chest: CTAB, no wheezes, rales, or rhonchi Abdominal: Soft. Non-tender, non-distended, bowel sounds are normal, no masses, organomegaly, or  guarding present.  Neurological: A&O x3, Strenght is normal and symmetric bilaterally, cranial nerve II-XII are grossly intact, no focal motor deficit, sensory intact to light touch bilaterally.  Extremities : No Cyanosis, Clubbing or Edema   Labs on Admission:  Results for orders placed during the hospital encounter of 02/06/12 (from the past 48 hour(s))  GLUCOSE, CAPILLARY     Status: Normal   Collection Time   02/06/12 12:04 PM      Component Value Range Comment   Glucose-Capillary 96  70 - 99 mg/dL   CBC WITH DIFFERENTIAL     Status: Abnormal   Collection Time   02/06/12 12:35 PM      Component Value Range Comment   WBC 7.8  4.0 - 10.5 K/uL    RBC 4.12  3.87 - 5.11 MIL/uL    Hemoglobin  12.5  12.0 - 15.0 g/dL    HCT 21.3  08.6 - 57.8 %    MCV 91.0  78.0 - 100.0 fL    MCH 30.3  26.0 - 34.0 pg    MCHC 33.3  30.0 - 36.0 g/dL    RDW 46.9  62.9 - 52.8 %    Platelets 241  150 - 400 K/uL    Neutrophils Relative 81 (*) 43 - 77 %    Neutro Abs 6.4  1.7 - 7.7 K/uL    Lymphocytes Relative 11 (*) 12 - 46 %    Lymphs Abs 0.9  0.7 - 4.0 K/uL    Monocytes Relative 7  3 - 12 %    Monocytes Absolute 0.5  0.1 - 1.0 K/uL    Eosinophils Relative 1  0 - 5 %    Eosinophils Absolute 0.1  0.0 - 0.7 K/uL    Basophils Relative 0  0 - 1 %    Basophils Absolute 0.0  0.0 - 0.1 K/uL   COMPREHENSIVE METABOLIC PANEL     Status: Abnormal   Collection Time   02/06/12 12:35 PM      Component Value Range Comment   Sodium 141  135 - 145 mEq/L    Potassium 3.9  3.5 - 5.1 mEq/L    Chloride 104  96 - 112 mEq/L    CO2 27  19 - 32 mEq/L    Glucose, Bld 95  70 - 99 mg/dL    BUN 14  6 - 23 mg/dL    Creatinine, Ser 4.13  0.50 - 1.10 mg/dL    Calcium 9.6  8.4 - 24.4 mg/dL    Total Protein 7.6  6.0 - 8.3 g/dL    Albumin 4.0  3.5 - 5.2 g/dL    AST 29  0 - 37 U/L    ALT 22  0 - 35 U/L    Alkaline Phosphatase 209 (*) 39 - 117 U/L    Total Bilirubin 0.6  0.3 - 1.2 mg/dL    GFR calc non Af Amer 63 (*) >90 mL/min      GFR calc Af Amer 74 (*) >90 mL/min   PROTIME-INR     Status: Normal   Collection Time   02/06/12 12:35 PM      Component Value Range Comment   Prothrombin Time 12.2  11.6 - 15.2 seconds    INR 0.91  0.00 - 1.49   TROPONIN I     Status: Normal   Collection Time   02/06/12 12:35 PM      Component Value Range Comment   Troponin I <0.30  <0.30 ng/mL   URINALYSIS, ROUTINE W REFLEX MICROSCOPIC     Status: Abnormal   Collection Time   02/06/12 12:42 PM      Component Value Range Comment   Color, Urine YELLOW  YELLOW    APPearance HAZY (*) CLEAR    Specific Gravity, Urine 1.007  1.005 - 1.030    pH 7.0  5.0 - 8.0    Glucose, UA NEGATIVE  NEGATIVE mg/dL    Hgb urine dipstick SMALL (*) NEGATIVE    Bilirubin Urine NEGATIVE  NEGATIVE    Ketones, ur NEGATIVE  NEGATIVE mg/dL    Protein, ur NEGATIVE  NEGATIVE mg/dL    Urobilinogen, UA 0.2  0.0 - 1.0 mg/dL    Nitrite NEGATIVE  NEGATIVE    Leukocytes, UA NEGATIVE  NEGATIVE   URINE MICROSCOPIC-ADD ON  Status: Normal   Collection Time   02/06/12 12:42 PM      Component Value Range Comment   Squamous Epithelial / LPF RARE  RARE    WBC, UA 0-2  <3 WBC/hpf    RBC / HPF 0-2  <3 RBC/hpf    Bacteria, UA RARE  RARE   PRO B NATRIURETIC PEPTIDE     Status: Abnormal   Collection Time   02/06/12  2:01 PM      Component Value Range Comment   Pro B Natriuretic peptide (BNP) 931.5 (*) 0 - 450 pg/mL     Radiological Exams on Admission: Dg Chest 2 View  02/06/2012  *RADIOLOGY REPORT*  Clinical Data: Weakness.  Dizziness.  CHEST - 2 VIEW  Comparison: 01/20/2012.  Findings: Cardiomegaly.  Dual lead right subclavian cardiac pacemaker appears similar to the prior exam.  Small bilateral pleural effusions.  Aortic arch atherosclerosis.  There is no pulmonary edema.  Coronary artery stent is present. No airspace disease.  IMPRESSION: Cardiomegaly and small bilateral pleural effusions compatible with mild CHF.   Original Report Authenticated By: Andreas Newport,  M.D.     Assessment/Plan Active Problems:  HYPOTHYROIDISM  HYPERTENSION  CORONARY ARTERY DISEASE  NAUSEA  H/O cardiac pacemaker, Medtronic versa implanted 01/2008, though original implanted 2000 for syncope and heart block and ventricular asystole.  Paroxysmal a-fib, history of, off coumadin since 10/2010, without recurrence of afib.  Dizziness, near syncope  Diastolic dysfunction, grade two  Hypotension  Syncope Patient has orthostatic hypotension, she is followed by cardiology. She has pacemaker in place, cardiology is going to interrogate the pacemaker. Will continue her on home medications. [facility] is following the patient.  CAD Will continue Ranexa, Metoprolol, Clopidogrel.  Paroxysmal atrial fibrillation Will continue on amiodarone.  Hypertension Continue the antihypertensive medications at this time.  Chronic UTI Patient is on Nitrofurantoin, will continue on the dose of 100 mg po daily.  Time Spent on Admission: 65 min  Sarit Sparano S [facility] Pager: [phone] 02/06/2012, 5:32 PM

## 2012-02-06 NOTE — ED Notes (Signed)
Patient endorses mild dizziness.

## 2012-02-06 NOTE — ED Provider Notes (Signed)
History     CSN: [id number]  Arrival date & time 02/06/12  1157   First MD Initiated Contact with Patient 02/06/12 1209      Chief Complaint  Patient presents with  . Weakness    HPI Pt recently discharged from the hospital about 2 weeks ago.  She was sitting at breakfast this AM and per her son, she "started to zone off".  This happened for a few seconds before she returned to her baseline.  She did not have LOC, have a fall, or change in mentation.  Son did take her BP after she had this episode and reported 70/40.  He went to call her cardiologist and PCP office who both recommended she come for evaluation in the ED.   Denies CP, SOB, fatigue, weakness, dizziness, HA, blurred vision, diplopia.  No fever, chills or sweats.   Past Medical History  Diagnosis Date  . Hypertension   . CHF (congestive heart failure)   . A-fib   . Hypothyroid   . GERD (gastroesophageal reflux disease)   . Hypothyroidism   . Bradycardia   . Pancreatitis   . Coronary artery disease   . Shingles   . CORONARY ARTERY DISEASE 11/28/2006    Qualifier: Diagnosis of  By: Tawanna Cooler MD, Eugenio Hoes   . Pacemaker   . Paroxysmal a-fib, history of 08/02/2011  . Tremor, essential 08/02/2011  . Esophageal disorder, with history of dilatation 08/02/2011  . Angina   . H/O hiatal hernia   . Neuromuscular disorder     essential tremors  . UTI (lower urinary tract infection) 08/03/2011  . H/O cardiac pacemaker, Medtronic versa implanted 01/2008, though original implanted 2000 for syncope and heart block and ventricular asystole. 08/02/2011  . Dizziness, near syncope 11/07/2011  . HTN (hypertension), accelerated this admit 11/08/2011    Past Surgical History  Procedure Date  . Permanent pacemaker   . S/p total hyserectomy and left oophorectomy   . S/p cholecystectomy   . Repair spigelian hernia   . S/p childbirth     x2  . S/p cataract extraction with lens implant   . S/p appendectomy   . Splint 2008  . Coronary  angioplasty   . Insert / replace / remove pacemaker   . Cholecystectomy   . Appendectomy     Family History  Problem Relation Age of Onset  . Sudden death Mother     History  Substance Use Topics  . Smoking status: Never Smoker   . Smokeless tobacco: Never Used  . Alcohol Use: No      Review of Systems  Constitutional: Negative for fatigue.  Eyes: Negative for visual disturbance.  Respiratory: Negative for cough and shortness of breath.   Cardiovascular: Negative for chest pain.  Gastrointestinal: Negative for nausea and abdominal distention.  Neurological: Positive for weakness and light-headedness. Negative for dizziness.    Allergies  Codeine; Isosorbide mononitrate; Lorazepam; Prednisone; Valium; and Penicillins  Home Medications   Current Outpatient Rx  Name  Route  Sig  Dispense  Refill  . AMIODARONE HCL 200 MG PO TABS   Oral   Take 200 mg by mouth daily.           . ASPIRIN 81 MG PO TBEC   Oral   Take 81 mg by mouth daily.           [name] CALCIUM CARBONATE ANTACID 500 MG PO CHEW   Oral   Chew 1 tablet by mouth daily.          [name]  CLOPIDOGREL BISULFATE 75 MG PO TABS   Oral   Take 75 mg by mouth daily.           [name] LEVOTHYROXINE SODIUM 75 MCG PO TABS   Oral   Take 75 mcg by mouth daily.         [name] LORATADINE 10 MG PO TABS   Oral   Take 10 mg by mouth daily.           [name] LOSARTAN POTASSIUM 50 MG PO TABS   Oral   Take 50 mg by mouth daily.         [name] MECLIZINE HCL 25 MG PO TABS   Oral   Take 25 mg by mouth 2 (two) times daily as needed. For dizziness         . METOPROLOL TARTRATE 50 MG PO TABS   Oral   Take 50 mg by mouth 2 (two) times daily.         [name] MIDODRINE HCL 2.5 MG PO TABS   Oral   Take 2.5 mg by mouth 3 (three) times daily with meals.         . MULTIVITAMINS PO CAPS   Oral   Take 1 capsule by mouth daily.           [name] PRESERVISION/LUTEIN PO CAPS   Oral   Take by mouth 2 (two) times daily.           [name]  NITROFURANTOIN MONOHYD MACRO 100 MG PO CAPS   Oral   Take 100 mg by mouth daily. For 6 weeks  Started 02/02/2012         . NITROGLYCERIN 0.4 MG SL SUBL   Sublingual   Place 0.4 mg under the tongue every 5 (five) minutes as needed. For chest pain         . PANTOPRAZOLE SODIUM 40 MG PO TBEC   Oral   Take 40 mg by mouth 2 (two) times daily.         [name] POLYETHYLENE GLYCOL 3350 PO PACK   Oral   Take 17 g by mouth daily as needed. For constipation         . RANOLAZINE ER 500 MG PO TB12   Oral   Take 500 mg by mouth 2 (two) times daily.         [name] SIMVASTATIN 20 MG PO TABS   Oral   Take 20 mg by mouth every evening.         [name] TRAMADOL HCL 50 MG PO TABS   Oral   Take 25 mg by mouth at bedtime.            BP 166/61  Pulse 72  Temp 99.1 F (37.3 C)  Resp 16  SpO2 99%  Physical Exam  Constitutional: She is oriented to person, place, and time.  HENT:  Head: Normocephalic and atraumatic.  Eyes: Pupils are equal, round, and reactive to light.  Cardiovascular: Normal rate and regular rhythm.   No murmur heard. Pulmonary/Chest: Effort normal and breath sounds normal. She has no wheezes.  Abdominal: She exhibits no distension.  Neurological: She is alert and oriented to person, place, and time. No cranial nerve deficit.  Skin: Skin is warm and dry.    ED Course  Procedures (including critical care time)  Labs Reviewed  CBC WITH DIFFERENTIAL - Abnormal; Notable for the following:    Neutrophils Relative 81 (*)     Lymphocytes Relative 11 (*)  All other components within normal limits  COMPREHENSIVE METABOLIC PANEL - Abnormal; Notable for the following:    Alkaline Phosphatase 209 (*)     GFR calc non Af Amer 63 (*)     GFR calc Af Amer 74 (*)     All other components within normal limits  URINALYSIS, ROUTINE W REFLEX MICROSCOPIC - Abnormal; Notable for the following:    APPearance HAZY (*)     Hgb urine dipstick SMALL (*)     All other components within  normal limits  GLUCOSE, CAPILLARY  PROTIME-INR  TROPONIN I  URINE MICROSCOPIC-ADD ON  PRO B NATRIURETIC PEPTIDE   Dg Chest 2 View  02/06/2012  *RADIOLOGY REPORT*  Clinical Data: Weakness.  Dizziness.  CHEST - 2 VIEW  Comparison: 01/20/2012.  Findings: Cardiomegaly.  Dual lead right subclavian cardiac pacemaker appears similar to the prior exam.  Small bilateral pleural effusions.  Aortic arch atherosclerosis.  There is no pulmonary edema.  Coronary artery stent is present. No airspace disease.  IMPRESSION: Cardiomegaly and small bilateral pleural effusions compatible with mild CHF.   Original Report Authenticated By: Andreas Newport, M.D.       Date: 02/06/2012  Rate: 71  Rhythm: Atrial Paced  QRS Axis: normal  Intervals: normal  ST/T Wave abnormalities: normal  Conduction Disutrbances:first-degree A-V block   Narrative Interpretation:   Old EKG Reviewed: unchanged    MDM  1) Near Syncopal Episode - Pt recently d/c for similar episode and had w/u for syncope including neuro and cardiology evaluations.  - EKG unchanged from previous.  No recent changes in medications.   - CBC, UA, INR, Troponin, CMP for evaluation of possible inciting etiologies.   - ProBNP to evaluate for possible CHF exacerbation.  Also will go ahead and get Orthostatic Vitals for possible orthostatic hypotension.   - Will need admission and most likely evaluation from cardiology's standpoint again.    Twana First Paulina Fusi, DO of [facility] 02/06/2012, 3:05 PM    Briscoe Deutscher, DO 02/06/12 1505

## 2012-02-06 NOTE — ED Notes (Signed)
Received call from [employer].  Per representative, "everything is within expected range; greater than 80% paced in the atrium. Report will be faxed to ED within 10 minutes.

## 2012-02-06 NOTE — ED Provider Notes (Addendum)
I saw and evaluated the patient, reviewed the resident's note and I agree with the findings and plan.  Near syncopal episode and hypotension to 60s at home.  Now hypertensive, awake, alert, no focal deficits.  Hx recurrent orthostasis with recent admission for similar symptoms. BP medicines reduced during previous admission, on midodrine.  No chest pain or abdominal pain. Still c/o "dizziness"/  Glynn Octave, MD 02/06/12 1610  Glynn Octave, MD 05/06/12 1534

## 2012-02-06 NOTE — ED Notes (Signed)
Pt states was up eating breakfast this am and began to feel dizzy. Nurse Aide took blood pressure and it was between 65/80 systolic. Pt reports dizziness now, denies pain. No recent illness. No neuro deficits noted.

## 2012-02-06 NOTE — Telephone Encounter (Signed)
Mitzi Davenport , PT with [facility] calling.  She is in the home with patient and her son.  She had a blank stare and nonverbal episode during breakfast.  Her b/p dropped to 65/36 , hr 70 and pulse ox 93% during that time. She was cold and clammy during this time.   Her b/p is back to normal now. Last b/p reading 144/72, hr 70.  Has had dizziness.  She is alert and talkative to Cecilia now. Patient reports that "after I come to myself my left arm hurt a little bit."   911 advised.

## 2012-02-06 NOTE — ED Notes (Signed)
Pt woke up at 8am and was feeling dizzy and weak, at home blood pressures ranged from 65/36 to 80s systolic, home care nurse visited and patient pressure rising, but patient still weak. Now patient is hypertensive.

## 2012-02-06 NOTE — ED Notes (Signed)
Patient endorses dizziness with sitting up in bed and up to the side of the bed.  Did not ambulate patient due to contraindications.

## 2012-02-06 NOTE — ED Notes (Signed)
Gluc 96

## 2012-02-06 NOTE — ED Notes (Signed)
Pacemaker was interrogated; now awaiting interrogation report.

## 2012-02-06 NOTE — ED Notes (Signed)
Attempted to interrogate pacer but there is a machine malfunction.

## 2012-02-06 NOTE — ED Notes (Signed)
Notified RN of CBG 96 

## 2012-02-06 NOTE — ED Notes (Signed)
Assisted patient and off bed pan. Patient endorsed dizziness with turning.

## 2012-02-06 NOTE — Progress Notes (Signed)
[facility]  Patient Status: Active (receiving services up to time of hospitalization)  [facility] is providing the following services: RN, PT, OT and HHA  If patient discharges after hours, please call [phone].   Jodene Nam 02/06/2012, 8:40 PM

## 2012-02-06 NOTE — Consult Note (Signed)
Reason for Consult:  Hypotension Referring Physician:    SHAYANA HORNSTEIN is an 78 y.o. female.  HPI:   78 year old female with past medical history of hypertension, coronary artery disease, hypertension, heart failure, hypothyroidism, PAF. status post pacemaker that comes in for syncope Hx of CAD With prior PCI with cutting balloon atherectomy and DES stenting of the LAD in 2008. In October 2011 she had 75% narrowing in her LAD stent and was restented with another drug-eluting Cypher stent. In June 2012 she was readmitted with chest pain recathed at that time which was revealing 70% in-stent restenosis in the LAD stent which was treated with a sandwich 3.8 DES resolute stent. September 2012 she was readmitted for chest pain cardiac cath at that time revealed patent stents. In May of this year she was admitted with chest pain negative EKG negative enzymes and she was set up for an outpatient Lexiscan Myoview which was negative for ischemia EF was normal and was felt to be a low risk study.  She was on Coumadin aspirin and Plavix but the Coumadin has been DC'd. She also has a permanent pacemaker a Medtronic Versa implanted in 2009 which was end-of-life change her initial pacemaker was 2000, implanted for syncope and heart block and ventricular asystole.   She was discharged recently on midodrine.    She presents today with presyncope and hypotension.  Her son states that during breakfast she appeared as though she "zoned out" and her head leaned forward.  He said she did not pass out.  SBP was .  Patient's son reports that 2 days ago patient had been urinating quite frequently during the night and as well several nights prior to that. She had been started on nitrofurantoin by Dr. Tawanna Cooler.  The first dose was this past Saturday. Patient reports dizziness, nausea during the episode. She denies vomiting fever, chest pain, shortness of breath, abdominal pain, palpitations, orthopnea, PND, dysuria,  hematuria.   Past Medical History  Diagnosis Date  . Hypertension   . CHF (congestive heart failure)   . A-fib   . Hypothyroid   . GERD (gastroesophageal reflux disease)   . Hypothyroidism   . Bradycardia   . Pancreatitis   . Coronary artery disease   . Shingles   . CORONARY ARTERY DISEASE 11/28/2006    Qualifier: Diagnosis of  By: Tawanna Cooler MD, Eugenio Hoes   . Pacemaker   . Paroxysmal a-fib, history of 08/02/2011  . Tremor, essential 08/02/2011  . Esophageal disorder, with history of dilatation 08/02/2011  . Angina   . H/O hiatal hernia   . Neuromuscular disorder     essential tremors  . UTI (lower urinary tract infection) 08/03/2011  . H/O cardiac pacemaker, Medtronic versa implanted 01/2008, though original implanted 2000 for syncope and heart block and ventricular asystole. 08/02/2011  . Dizziness, near syncope 11/07/2011  . HTN (hypertension), accelerated this admit 11/08/2011    Past Surgical History  Procedure Date  . Permanent pacemaker   . S/p total hyserectomy and left oophorectomy   . S/p cholecystectomy   . Repair spigelian hernia   . S/p childbirth     x2  . S/p cataract extraction with lens implant   . S/p appendectomy   . Splint 2008  . Coronary angioplasty   . Insert / replace / remove pacemaker   . Cholecystectomy   . Appendectomy     Family History  Problem Relation Age of Onset  . Sudden death Mother  Social History:  reports that she has never smoked. She has never used smokeless tobacco. She reports that she does not drink alcohol or use illicit drugs.  Allergies:  Allergies  Allergen Reactions  . Codeine Other (See Comments)    Passes out  . Isosorbide Mononitrate     REACTION: fainted  . Lorazepam Other (See Comments)    hallucinations  . Prednisone Other (See Comments)    nausea  . Valium (Diazepam) Other (See Comments)    Reaction unknown  . Penicillins Rash    Medications  Results for orders placed during the hospital encounter of  02/06/12 (from the past 48 hour(s))  GLUCOSE, CAPILLARY     Status: Normal   Collection Time   02/06/12 12:04 PM      Component Value Range Comment   Glucose-Capillary 96  70 - 99 mg/dL   CBC WITH DIFFERENTIAL     Status: Abnormal   Collection Time   02/06/12 12:35 PM      Component Value Range Comment   WBC 7.8  4.0 - 10.5 K/uL    RBC 4.12  3.87 - 5.11 MIL/uL    Hemoglobin 12.5  12.0 - 15.0 g/dL    HCT 69.6  29.5 - 28.4 %    MCV 91.0  78.0 - 100.0 fL    MCH 30.3  26.0 - 34.0 pg    MCHC 33.3  30.0 - 36.0 g/dL    RDW 13.2  44.0 - 10.2 %    Platelets 241  150 - 400 K/uL    Neutrophils Relative 81 (*) 43 - 77 %    Neutro Abs 6.4  1.7 - 7.7 K/uL    Lymphocytes Relative 11 (*) 12 - 46 %    Lymphs Abs 0.9  0.7 - 4.0 K/uL    Monocytes Relative 7  3 - 12 %    Monocytes Absolute 0.5  0.1 - 1.0 K/uL    Eosinophils Relative 1  0 - 5 %    Eosinophils Absolute 0.1  0.0 - 0.7 K/uL    Basophils Relative 0  0 - 1 %    Basophils Absolute 0.0  0.0 - 0.1 K/uL   COMPREHENSIVE METABOLIC PANEL     Status: Abnormal   Collection Time   02/06/12 12:35 PM      Component Value Range Comment   Sodium 141  135 - 145 mEq/L    Potassium 3.9  3.5 - 5.1 mEq/L    Chloride 104  96 - 112 mEq/L    CO2 27  19 - 32 mEq/L    Glucose, Bld 95  70 - 99 mg/dL    BUN 14  6 - 23 mg/dL    Creatinine, Ser 7.25  0.50 - 1.10 mg/dL    Calcium 9.6  8.4 - 36.6 mg/dL    Total Protein 7.6  6.0 - 8.3 g/dL    Albumin 4.0  3.5 - 5.2 g/dL    AST 29  0 - 37 U/L    ALT 22  0 - 35 U/L    Alkaline Phosphatase 209 (*) 39 - 117 U/L    Total Bilirubin 0.6  0.3 - 1.2 mg/dL    GFR calc non Af Amer 63 (*) >90 mL/min    GFR calc Af Amer 74 (*) >90 mL/min   PROTIME-INR     Status: Normal   Collection Time   02/06/12 12:35 PM      Component Value Range Comment  Prothrombin Time 12.2  11.6 - 15.2 seconds    INR 0.91  0.00 - 1.49   TROPONIN I     Status: Normal   Collection Time   02/06/12 12:35 PM      Component Value Range Comment    Troponin I <0.30  <0.30 ng/mL   URINALYSIS, ROUTINE W REFLEX MICROSCOPIC     Status: Abnormal   Collection Time   02/06/12 12:42 PM      Component Value Range Comment   Color, Urine YELLOW  YELLOW    APPearance HAZY (*) CLEAR    Specific Gravity, Urine 1.007  1.005 - 1.030    pH 7.0  5.0 - 8.0    Glucose, UA NEGATIVE  NEGATIVE mg/dL    Hgb urine dipstick SMALL (*) NEGATIVE    Bilirubin Urine NEGATIVE  NEGATIVE    Ketones, ur NEGATIVE  NEGATIVE mg/dL    Protein, ur NEGATIVE  NEGATIVE mg/dL    Urobilinogen, UA 0.2  0.0 - 1.0 mg/dL    Nitrite NEGATIVE  NEGATIVE    Leukocytes, UA NEGATIVE  NEGATIVE   URINE MICROSCOPIC-ADD ON     Status: Normal   Collection Time   02/06/12 12:42 PM      Component Value Range Comment   Squamous Epithelial / LPF RARE  RARE    WBC, UA 0-2  <3 WBC/hpf    RBC / HPF 0-2  <3 RBC/hpf    Bacteria, UA RARE  RARE   PRO B NATRIURETIC PEPTIDE     Status: Abnormal   Collection Time   02/06/12  2:01 PM      Component Value Range Comment   Pro B Natriuretic peptide (BNP) 931.5 (*) 0 - 450 pg/mL     Dg Chest 2 View  02/06/2012  *RADIOLOGY REPORT*  Clinical Data: Weakness.  Dizziness.  CHEST - 2 VIEW  Comparison: 01/20/2012.  Findings: Cardiomegaly.  Dual lead right subclavian cardiac pacemaker appears similar to the prior exam.  Small bilateral pleural effusions.  Aortic arch atherosclerosis.  There is no pulmonary edema.  Coronary artery stent is present. No airspace disease.  IMPRESSION: Cardiomegaly and small bilateral pleural effusions compatible with mild CHF.   Original Report Authenticated By: Andreas Newport, M.D.     Review of Systems  Constitutional: Positive for diaphoresis. Negative for fever.  HENT: Negative for congestion and sore throat.   Respiratory: Negative for cough and shortness of breath.   Cardiovascular: Negative for chest pain, palpitations, orthopnea, leg swelling and PND.  Gastrointestinal: Positive for nausea. Negative for vomiting,  abdominal pain, diarrhea, constipation and blood in stool.  Genitourinary: Negative for dysuria and hematuria.  Neurological: Positive for dizziness.   Blood pressure 187/61, pulse 71, temperature 99.1 F (37.3 C), resp. rate 16, SpO2 96.00%. Physical Exam  Constitutional: She is oriented to person, place, and time. She appears well-developed and well-nourished. No distress.  HENT:  Head: Normocephalic and atraumatic.  Mouth/Throat: Oropharynx is clear and moist. No oropharyngeal exudate.  Eyes: EOM are normal. Pupils are equal, round, and reactive to light. No scleral icterus.  Neck: Normal range of motion. Neck supple.  Cardiovascular: Normal rate, regular rhythm, S1 normal and S2 normal.   No murmur heard. Pulses:      Radial pulses are 2+ on the right side, and 2+ on the left side.  Respiratory: Effort normal and breath sounds normal. She has no wheezes. She has no rales.  GI: Soft. Bowel sounds are normal. She exhibits no  distension. There is no tenderness.  Musculoskeletal: She exhibits no edema.  Lymphadenopathy:    She has no cervical adenopathy.  Neurological: She is alert and oriented to person, place, and time.  Skin: Skin is warm and dry.  Psychiatric: She has a normal mood and affect.    Assessment/Plan: Patient Active Hospital Problem List: HYPOTHYROIDISM (11/12/2006) HYPERTENSION (11/12/2006) CORONARY ARTERY DISEASE (11/28/2006) NAUSEA (11/09/2006) H/O cardiac pacemaker, Medtronic versa implanted 01/2008, though original implanted 2000 for syncope and heart block and ventricular asystole. (08/02/2011) Paroxysmal a-fib, history of, off coumadin since 10/2010, without recurrence of afib. (08/02/2011) Dizziness, near syncope (11/07/2011) Diastolic dysfunction, grade two (01/24/2012) Hypotension (02/06/2012)  Plan:  Patient presents with the hypotension and hypertension. At home systolic blood pressures in the 70s and currently in the ER she is hypertensive at 187/61 and was as  high as 204/80.  Recommend continuing Midodrine. Recheck orthostatic blood pressures in the morning.  Interrogate pacemaker.   HAGER, BRYAN 02/06/2012, 4:24 PM   I seen and evaluated the patient this morning along with the PA/NP. I agree with their findings, examination as well as impression recommendations.   As she is quite complicated & medication adjustments affect Ischemic CM management & chronic angina, [facility] will be happy to take over as primary service.  Very complicated situation -- recently discharged with similar issues in setting of UTI -- BP meds decreased & midodrine started.  Was doing well until this AM -- shortly after taking meds, became profoundly weak -- SBPs in ~70s.  By the time she reached ER - BPs back up to 190s-200s.  She clearly has orthostatic hypotension with vasodepressor (neurocardiogenic, Shy Drager's) tendency & is very responsive to BP meds.  I think the best plan is lower, but more frequent BP med dosing to avoid big pressure swings.  Also need to space out BB & ARB.   Will change ARB to 25mg  bid along with bid BB (~1-2 hr apart dosing).   Continue midodrine -- no SSx of  CHF, so no Lasix.  Continue Ranexa for angina Rx, as she denies angina @ this time despite BP elevation which usually triggers it.  Marykay Lex, M.D., M.S. [facility] [address] [place], [place]  [address]  [phone] Pager # [phone] 02/06/2012 7:48 PM

## 2012-02-06 NOTE — Telephone Encounter (Signed)
notified Dr Tawanna Cooler

## 2012-02-07 ENCOUNTER — Encounter (HOSPITAL_COMMUNITY): Payer: Self-pay | Admitting: General Practice

## 2012-02-07 LAB — CBC
Platelets: 210 10*3/uL (ref 150–400)
RBC: 3.69 MIL/uL — ABNORMAL LOW (ref 3.87–5.11)
WBC: 5.6 10*3/uL (ref 4.0–10.5)

## 2012-02-07 LAB — COMPREHENSIVE METABOLIC PANEL
BUN: 11 mg/dL (ref 6–23)
CO2: 29 mEq/L (ref 19–32)
Chloride: 104 mEq/L (ref 96–112)
Creatinine, Ser: 0.8 mg/dL (ref 0.50–1.10)
GFR calc Af Amer: 74 mL/min — ABNORMAL LOW (ref >90)
GFR calc non Af Amer: 63 mL/min — ABNORMAL LOW (ref >90)
Total Bilirubin: 0.5 mg/dL (ref 0.3–1.2)

## 2012-02-07 MED ORDER — MECLIZINE HCL 25 MG PO TABS
25.0000 mg | ORAL_TABLET | Freq: Two times a day (BID) | ORAL | Status: DC
  Administered 2012-02-07 – 2012-02-15 (×17): 25 mg via ORAL
  Filled 2012-02-07 (×18): qty 1

## 2012-02-07 NOTE — Progress Notes (Signed)
[facility]  Subjective: Complaining of the room spinning when she rolls side to side.   Objective: Vital signs in last 24 hours: Temp:  [97.7 F (36.5 C)-99.1 F (37.3 C)] 97.7 F (36.5 C) (11/07 0516) Pulse Rate:  [70-77] 71  (11/07 0516) Resp:  [16-18] 18  (11/07 0516) BP: (150-204)/(59-86) 150/59 mmHg (11/07 0516) SpO2:  [94 %-99 %] 94 % (11/07 0516) Weight:  [66.3 kg (146 lb 2.6 oz)] 66.3 kg (146 lb 2.6 oz) (11/06 2008) Last BM Date: 02/05/12  Intake/Output from previous day:   Intake/Output this shift: Total I/O In: 240 [P.O.:240] Out: -   Medications Current Facility-Administered Medications  Medication Dose Route Frequency Provider Last Rate Last Dose  . 0.9 %  sodium chloride infusion  250 mL Intravenous PRN Meredeth Ide, MD      . amiodarone (PACERONE) tablet 200 mg  200 mg Oral Daily Meredeth Ide, MD   200 mg at 02/07/12 1104  . aspirin chewable tablet 81 mg  81 mg Oral Daily Meredeth Ide, MD   81 mg at 02/07/12 1104  . calcium carbonate (TUMS - dosed in mg elemental calcium) chewable tablet 200 mg of elemental calcium  1 tablet Oral Daily Meredeth Ide, MD   200 mg of elemental calcium at 02/07/12 1104  . clopidogrel (PLAVIX) tablet 75 mg  75 mg Oral Daily Meredeth Ide, MD   75 mg at 02/07/12 1103  . levothyroxine (SYNTHROID, LEVOTHROID) tablet 75 mcg  75 mcg Oral Daily Meredeth Ide, MD   75 mcg at 02/07/12 1104  . loratadine (CLARITIN) tablet 10 mg  10 mg Oral Daily Meredeth Ide, MD   10 mg at 02/07/12 1104  . losartan (COZAAR) tablet 25 mg  25 mg Oral BID Marykay Lex, MD   25 mg at 02/07/12 0837  . meclizine (ANTIVERT) tablet 25 mg  25 mg Oral BID PRN Meredeth Ide, MD      . metoprolol tartrate (LOPRESSOR) tablet 50 mg  50 mg Oral BID Marykay Lex, MD   50 mg at 02/07/12 0817  . nitrofurantoin (macrocrystal-monohydrate) (MACROBID) capsule 100 mg  100 mg Oral Daily Meredeth Ide, MD   100 mg at 02/07/12 1104  . pantoprazole  (PROTONIX) EC tablet 40 mg  40 mg Oral BID Meredeth Ide, MD   40 mg at 02/07/12 1104  . polyethylene glycol (MIRALAX / GLYCOLAX) packet 17 g  17 g Oral Daily PRN Meredeth Ide, MD      . ranolazine (RANEXA) 12 hr tablet 500 mg  500 mg Oral BID Meredeth Ide, MD   500 mg at 02/07/12 1104  . simvastatin (ZOCOR) tablet 20 mg  20 mg Oral QPM Meredeth Ide, MD   20 mg at 02/06/12 2106  . sodium chloride 0.9 % injection 3 mL  3 mL Intravenous Q12H Meredeth Ide, MD   3 mL at 02/07/12 1105  . sodium chloride 0.9 % injection 3 mL  3 mL Intravenous PRN Meredeth Ide, MD      . traMADol Janean Sark) tablet 25 mg  25 mg Oral QHS Meredeth Ide, MD   25 mg at 02/06/12 2112  . [DISCONTINUED] losartan (COZAAR) tablet 50 mg  50 mg Oral Daily Meredeth Ide, MD      . [DISCONTINUED] metoprolol tartrate (LOPRESSOR) tablet 50 mg  50 mg Oral BID Meredeth Ide, MD      . [  DISCONTINUED] midodrine (PROAMATINE) tablet 2.5 mg  2.5 mg Oral TID WC Meredeth Ide, MD        PE: General appearance: alert, cooperative and no distress Lungs: clear to auscultation bilaterally Heart: regular rate and rhythm, S1, S2 normal, no murmur, click, rub or gallop Extremities: NO LEE Pulses: 2+ and symmetric Skin: warm and dry. Neurologic: Grossly normal  Lab Results:   Basename 02/07/12 0555 02/06/12 1235  WBC 5.6 7.8  HGB 11.1* 12.5  HCT 33.9* 37.5  PLT 210 241   BMET  Basename 02/07/12 0555 02/06/12 1235  NA 141 141  K 3.9 3.9  CL 104 104  CO2 29 27  GLUCOSE 81 95  BUN 11 14  CREATININE 0.80 0.80  CALCIUM 8.9 9.6   PT/INR  Basename 02/06/12 1235  LABPROT 12.2  INR 0.91      Assessment/Plan  Active Problems:  HYPOTHYROIDISM  HYPERTENSION  CORONARY ARTERY DISEASE  NAUSEA  H/O cardiac pacemaker, Medtronic versa implanted 01/2008, though original implanted 2000 for syncope and heart block and ventricular asystole.  Paroxysmal a-fib, history of, off coumadin since 10/2010, without recurrence of afib.  Dizziness, near  syncope  Diastolic dysfunction, grade two  Hypotension  Syncope  Plan:  Check orthostatic BP this AM.   Meclizine is schedule PRN.  Will change to BID.  BP improved this AM.  Dr. Herbie Baltimore made a few changes to PO meds; decreasing doses and increasing frequencies.     LOS: 1 day    HAGER, BRYAN 02/07/2012 11:43 AM  I have seen and examined the patient along with HAGER, BRYAN, PA.  I have reviewed the chart, notes and new data.  I agree with PA's note.  Difficult situation - caught between systemic HTN (in setting of CAD, ischemic CMP and frequent angina) and recurrent severe orthostatic hypotension.  Recommend that the morning dose of proamatine be administered a full 30 minutes before she gets out of bed.  Thurmon Fair, MD, [facility] [phone] 02/07/2012, 2:01 PM

## 2012-02-07 NOTE — Care Management Note (Signed)
    Page 1 of 2   02/15/2012     2:22:28 PM   CARE MANAGEMENT NOTE 02/15/2012  Patient:  [name] Luna, [name] Luna   Account Number:  [id number]  Date Initiated:  02/07/2012  Documentation initiated by:  Orhan Mayorga  Subjective/Objective Assessment:   PT ADM WITH SYNCOPE ON 02/06/12.  PTA, PT INDEPENDENT, LIVES WITH HUSBAND.  SHE IS FOLLOWED BY [facility] FOR HHRN, PT AND OT.     Action/Plan:   WILL FOLLOW FOR HOME NEEDS AS PT PROGRESSES.  WILL NEED RESUMPTION OF CARE ORDERS PRIOR TO DC HOME.   Anticipated DC Date:  02/15/2012   Anticipated DC Plan:  SKILLED NURSING FACILITY  In-house referral  Clinical Social Worker      DC Planning Services  CM consult      Choice offered to / List presented to:             Status of service:  Completed, signed off Medicare Important Message given?   (If response is "NO", the following Medicare IM given date fields will be blank) Date Medicare IM given:   Date Additional Medicare IM given:    Discharge Disposition:  SKILLED NURSING FACILITY  Per UR Regulation:  Reviewed for med. necessity/level of care/duration of stay  If discussed at Long Length of Stay Meetings, dates discussed:   02/12/2012    Comments:  02/15/12 [name] Haley, RN,BSN [phone] PT DISCHARGED TO SNF TODAY, PER CSW ARRANGEMENTS.  02/13/12 [name] Youngblood,RN,BSN [phone] PHYSICAL THERAPY RECOMMENDING SNF FOR REHAB AT DISCHARGE; SON AT BEDSIDE WHEN CONSULT DONE.  WILL CONSULT CSW TO FACILITATE POSSIBLE DC TO SNF WHEN MEDICALLY STABLE.  PT WILL NEED VESTIBULAR REHAB AT SNF, PER PHYSICAL THERAPIST RECOMMENDATION.  02/12/12 [name] Tuminello,RN,BSN [phone] PT REMAINS IN HOUSE FOR BP MED ADJUSTMENTS, CONFUSION. PHYSICAL THERAPY TO FOLLOW.  AWAIT RECOMMENDATIONS.  02/08/12 [name] Jimmerson,RN,BSN [phone] PT FOR DISCHARGE HOME TODAY WITH SON.  MET WITH PT AND SON TO FINALIZE DC PLANS.  SON STATES HE IS WITH HER 24H/DAY. PT ACTIVE WITH [facility]; WILL RESUME SERVICES AS PRIOR TO ADMISSION.  [facility]  NOTIFIED OF DC TODAY.  SON STATES PT HAS RW, WC, AND BSC AT HOME.  HE DENIES ANY OTHER HOME NEEDS.

## 2012-02-07 NOTE — Progress Notes (Signed)
History:  78 year old female with past medical history of hypertension, coronary artery disease, hypertension, heart failure, hypothyroidism, PAF. status post pacemaker that came to the hospital for an episode of passing out in the AM of 02/06/12. Per patient's son, she was sitting at breakfast and passed out, though she was able to respond to his questions. Her BP per son, was 70/40. Patient has h/o orthostatic hypotension, is on multiple antihypertensive medications and also on Midodrine. She was hospitalized with similar complaints on 01/20/12-01/26/12. She was asymptomatic in the ED, and was admitted for further evaluation and management.   Comment: Patient has been seen by Dr Herbie Baltimore, [facility] today, and he has graciously taken over her care.  Medical team has signed off.   C. Paislea Hatton. MD, FACP.

## 2012-02-08 LAB — CORTISOL: Cortisol, Plasma: 12.6 ug/dL

## 2012-02-08 MED ORDER — METOPROLOL TARTRATE 25 MG PO TABS
25.0000 mg | ORAL_TABLET | Freq: Every day | ORAL | Status: DC
  Administered 2012-02-08 – 2012-02-15 (×8): 25 mg via ORAL
  Filled 2012-02-08 (×8): qty 1

## 2012-02-08 MED ORDER — METOPROLOL TARTRATE 50 MG PO TABS
50.0000 mg | ORAL_TABLET | Freq: Every day | ORAL | Status: DC
  Administered 2012-02-08 – 2012-02-14 (×7): 50 mg via ORAL
  Filled 2012-02-08 (×8): qty 1

## 2012-02-08 MED ORDER — HYDRALAZINE HCL 20 MG/ML IJ SOLN
10.0000 mg | Freq: Once | INTRAMUSCULAR | Status: AC
  Administered 2012-02-08: 10 mg via INTRAVENOUS
  Filled 2012-02-08: qty 0.5

## 2012-02-08 MED ORDER — MIDODRINE HCL 2.5 MG PO TABS
2.5000 mg | ORAL_TABLET | Freq: Three times a day (TID) | ORAL | Status: DC
  Filled 2012-02-08 (×5): qty 1

## 2012-02-08 NOTE — Progress Notes (Signed)
Nurse checked patient's BP and observed that it was 180/66 via dynamap. Nurse performed a manual BP and observed 170/72. Nurse contacted the NP on call for Dr. Herbie Baltimore. [name] Collet NP returned the page and instructed the nurse to administer 10 mg of Hydralazine IV once to lower BP. Nurse performed as was instructed. Harmon Pier

## 2012-02-08 NOTE — Progress Notes (Signed)
CARDIAC REHAB PHASE I   PRE:  Rate/Rhythm: paced 70  BP:  Supine: 162/72 right, 152/70 left  Sitting: 150/70 right, 160/80  Standing: 150/70   SaO2: 95%RA  MODE:  Ambulation: 250 ft   POST:  Rate/Rhythem: 88  BP:  Supine:   Sitting: 152/66  Standing:    SaO2: 96%RA [id number] Pt walked 250 ft with rollator and asst x 2 and gait belt. Pt stated felt good to walk. Denied dizziness during walk. Orthostatics as documented. To recliner with call bell. Son in room.  [name] Luna

## 2012-02-08 NOTE — Progress Notes (Signed)
Subjective: Hypertensive during the night, rec'd hydralzine for BP 180/66  Objective: Vital signs in last 24 hours: Temp:  [97.2 F (36.2 C)-98.1 F (36.7 C)] 97.2 F (36.2 C) (11/08 0326) Pulse Rate:  [71-84] 72  (11/08 0542) Resp:  [18] 18  (11/08 0326) BP: (153-184)/(64-73) 153/64 mmHg (11/08 0542) SpO2:  [94 %-96 %] 94 % (11/08 0326) Weight change:  Last BM Date: 02/05/12 Intake/Output from previous day: +423 11/07 0701 - 11/08 0700 In: 723 [P.O.:720; I.V.:3] Out: 300 [Urine:300] Intake/Output this shift:    PE: General:alert and oriented, no chest pain with HTN last pm Heart:S1S2 RRR no M,G, R Click Lungs:clear Abd:+ BS, soft, non tender Ext:no edema   Lab Results:  Basename 02/07/12 0555 02/06/12 1235  WBC 5.6 7.8  HGB 11.1* 12.5  HCT 33.9* 37.5  PLT 210 241   BMET  Basename 02/07/12 0555 02/06/12 1235  NA 141 141  K 3.9 3.9  CL 104 104  CO2 29 27  GLUCOSE 81 95  BUN 11 14  CREATININE 0.80 0.80  CALCIUM 8.9 9.6    Basename 02/06/12 1235  TROPONINI <0.30    Lab Results  Component Value Date   CHOL 150 11/08/2011   HDL 61 11/08/2011   LDLCALC 76 11/08/2011   TRIG 65 11/08/2011   CHOLHDL 2.5 11/08/2011   Lab Results  Component Value Date   HGBA1C 5.3 01/20/2012     Lab Results  Component Value Date   TSH 2.178 01/20/2012    Hepatic Function Panel  Basename 02/07/12 0555  PROT 6.2  ALBUMIN 3.2*  AST 23  ALT 17  ALKPHOS 167*  BILITOT 0.5  BILIDIR --  IBILI --    Studies/Results: Dg Chest 2 View  02/06/2012  *RADIOLOGY REPORT*  Clinical Data: Weakness.  Dizziness.  CHEST - 2 VIEW  Comparison: 01/20/2012.  Findings: Cardiomegaly.  Dual lead right subclavian cardiac pacemaker appears similar to the prior exam.  Small bilateral pleural effusions.  Aortic arch atherosclerosis.  There is no pulmonary edema.  Coronary artery stent is present. No airspace disease.  IMPRESSION: Cardiomegaly and small bilateral pleural effusions compatible with mild  CHF.   Original Report Authenticated By: Andreas Newport, M.D.     Medications: I have reviewed the patient's current medications.    [name] amiodarone  200 mg Oral Daily  . aspirin  81 mg Oral Daily  . calcium carbonate  1 tablet Oral Daily  . clopidogrel  75 mg Oral Daily  . [COMPLETED] hydrALAZINE  10 mg Intravenous Once  . levothyroxine  75 mcg Oral Daily  . loratadine  10 mg Oral Daily  . losartan  25 mg Oral BID  . meclizine  25 mg Oral BID  . metoprolol  50 mg Oral BID  . nitrofurantoin (macrocrystal-monohydrate)  100 mg Oral Daily  . pantoprazole  40 mg Oral BID  . ranolazine  500 mg Oral BID  . simvastatin  20 mg Oral QPM  . sodium chloride  3 mL Intravenous Q12H  . traMADol  25 mg Oral QHS  . [DISCONTINUED] midodrine  2.5 mg Oral TID WC   Assessment/Plan: Principal Problem:  *Syncope Active Problems:  CORONARY ARTERY DISEASE  H/O cardiac pacemaker, Medtronic versa implanted 01/2008, though original implanted 2000 for syncope and heart block and ventricular asystole.  Paroxysmal a-fib, history of, off coumadin since 10/2010, without recurrence of afib.  HYPOTHYROIDISM  HYPERTENSION  NAUSEA  Dizziness, near syncope  Diastolic dysfunction, grade two  Hypotension  PLAN:  I do not see orthostatic BP-have re-ordered.  Just done   Lying--144/53 p 75 Sitting-- 140/67 P 75 Standing--136/57 P 79    Cortisol level 12.6 am  Will have cardiac rehab ambulate  LOS: 2 days   INGOLD,LAURA R 02/08/2012, 8:11 AM  I seen and evaluated the patient this morning along with the PA/NP. I agree with their findings, examination as well as impression recommendations.  Overall, a stable 24+ hours -- would try to avoid over Rx-ing PM HTN (unless having angina, Hydralazine is a reasonable choice as it is short acting).  Stable on orthostatic checks.  We have arranged BP medication & midodrine dosing intervals to avoid huge BP swings --  Metoprolol 50mg  qhs & 25 mg qAM (taken 1 hr before  ARB)  Midodrine - taken immediately upon awakening & allowing ~30 min prior to getting up  ARB - changed from 50mg  qhs to 25mg  bid (1 hr after BB).  Ranexa used for antianginal.  So far, this seems to be helping.  Continue DAPT. HR stable on amiodarone.  Nitrofurantoin for UTI prophylaxis.  Provided she has a stable night, and stable orthostatic BP check tomorrow, she she should be fine for d/c. Will need detailed Med dosing & interval instructions for her son.  Marykay Lex, M.D., M.S. [facility] [address] [place], [place]  [address]  [phone] Pager # [phone] 02/08/2012 5:12 PM

## 2012-02-09 MED ORDER — LOSARTAN POTASSIUM 25 MG PO TABS
25.0000 mg | ORAL_TABLET | Freq: Every morning | ORAL | Status: DC
  Administered 2012-02-10: 25 mg via ORAL
  Filled 2012-02-09 (×2): qty 1

## 2012-02-09 MED ORDER — ONDANSETRON HCL 4 MG/2ML IJ SOLN
4.0000 mg | Freq: Four times a day (QID) | INTRAMUSCULAR | Status: DC | PRN
  Administered 2012-02-09: 4 mg via INTRAVENOUS

## 2012-02-09 MED ORDER — LOSARTAN POTASSIUM 50 MG PO TABS
50.0000 mg | ORAL_TABLET | Freq: Every evening | ORAL | Status: DC
  Administered 2012-02-09: 50 mg via ORAL
  Filled 2012-02-09 (×2): qty 1

## 2012-02-09 MED ORDER — MIDODRINE HCL 2.5 MG PO TABS
2.5000 mg | ORAL_TABLET | Freq: Two times a day (BID) | ORAL | Status: DC
  Filled 2012-02-09 (×6): qty 1

## 2012-02-09 MED ORDER — ONDANSETRON HCL 4 MG/2ML IJ SOLN
INTRAMUSCULAR | Status: AC
  Filled 2012-02-09: qty 2

## 2012-02-09 MED ORDER — HYDRALAZINE HCL 20 MG/ML IJ SOLN
10.0000 mg | Freq: Once | INTRAMUSCULAR | Status: AC
  Administered 2012-02-09: 10 mg via INTRAVENOUS
  Filled 2012-02-09: qty 0.5

## 2012-02-09 MED ORDER — LOSARTAN POTASSIUM 25 MG PO TABS: 25.0000 mg | ORAL_TABLET | Freq: Every day | ORAL | Status: DC

## 2012-02-09 NOTE — Progress Notes (Signed)
CARDIAC REHAB PHASE I   PRE:  Rate/Rhythm: Paced 72  BP:  Supine:   Sitting: recliner 146/56  Standing: 140/56   SaO2: 95 RA  MODE:  Ambulation: 100 ft one seated rest break   POST:  Rate/Rhythmn: 87  BP:  Supine:   Sitting: 161/61  Standing:    SaO2: 93 RA Pt oob in chair and c/o feeling tired.  Pt convinced to ambulate in hallway with rehab staff.  Pt fatigued easily and was unable to walk further than 100 feet.  Pt trembled on the way back to her room.  Pt back to chair per pt request.  Family member at bedside, call bell in place.  Pt denies any c/o distress.  Pt advised to walk again with nursing staff after she had a chance to rest.  Pt verbalized understanding. [phone]  Arna Medici

## 2012-02-09 NOTE — Progress Notes (Signed)
Pt complained of "being hot all over" and "feeling sick on my stomach." Pt also said she was seeing "spots," but this resolved soon after it started. BP 184/70, HR 73, temp 98.5, 97% on room air. NP on call notified. Orders received for Hydralazine and Zofran. Pt given 10 mg of IV Hydralazine and 4 mg of Zofran. Will continue to monitor.  Alfonso Ellis, RN

## 2012-02-09 NOTE — Progress Notes (Signed)
[facility] Progress Note  Subjective:  No chest pain or SOB. Became hot and nauseated last night with elevated BP to 184/70 treated with hydralazine.  Objective:   Vital Signs in the last 24 hours: Temp:  [97.2 F (36.2 C)-98.5 F (36.9 C)] 97.8 F (36.6 C) (11/09 0416) Pulse Rate:  [71-79] 71  (11/09 0416) Resp:  [16-18] 18  (11/09 0416) BP: (120-184)/(45-70) 143/53 mmHg (11/09 0416) SpO2:  [94 %-98 %] 94 % (11/09 0416)  Intake/Output from previous day: 11/08 0701 - 11/09 0700 In: 660 [P.O.:660] Out: 402 [Urine:400; Stool:2]  Scheduled:   . amiodarone  200 mg Oral Daily  . aspirin  81 mg Oral Daily  . calcium carbonate  1 tablet Oral Daily  . clopidogrel  75 mg Oral Daily  . [COMPLETED] hydrALAZINE  10 mg Intravenous Once  . levothyroxine  75 mcg Oral Daily  . loratadine  10 mg Oral Daily  . losartan  25 mg Oral BID  . meclizine  25 mg Oral BID  . metoprolol tartrate  50 mg Oral QHS  . metoprolol  25 mg Oral Daily  . midodrine  2.5 mg Oral TID WC  . nitrofurantoin (macrocrystal-monohydrate)  100 mg Oral Daily  . [COMPLETED] ondansetron      . pantoprazole  40 mg Oral BID  . ranolazine  500 mg Oral BID  . simvastatin  20 mg Oral QPM  . sodium chloride  3 mL Intravenous Q12H  . traMADol  25 mg Oral QHS  . [DISCONTINUED] metoprolol  50 mg Oral BID    Physical Exam:   General appearance: alert, cooperative and no distress Neck: no JVD and supple, symmetrical, trachea midline Lungs: clear to auscultation bilaterally Heart: regular rate and rhythm 1/6 semAbdomen: soft, non-tender; bowel sounds normal; no masses,  no organomegaly Extremities: no edema, redness or tenderness in the calves or thighs   Rate: 70  Rhythm: paced  Lab Results:    Basename 02/07/12 0555 02/06/12 1235  NA 141 141  K 3.9 3.9  CL 104 104  CO2 29 27  GLUCOSE 81 95  BUN 11 14  CREATININE 0.80 0.80    Basename 02/06/12 1235  TROPONINI <0.30    Hepatic Function Panel  Basename 02/07/12 0555  PROT 6.2  ALBUMIN 3.2*  AST 23  ALT 17  ALKPHOS 167*  BILITOT 0.5  BILIDIR --  IBILI --    Basename 02/06/12 1235  INR 0.91    Lipid Panel     Component Value Date/Time   CHOL 150 11/08/2011 0433   TRIG 65 11/08/2011 0433   HDL 61 11/08/2011 0433   CHOLHDL 2.5 11/08/2011 0433   VLDL 13 11/08/2011 0433   LDLCALC 76 11/08/2011 0433     Imaging:  No results found.    Assessment/Plan:   Principal Problem:  *Syncope Active Problems:  HYPOTHYROIDISM  HYPERTENSION  CORONARY ARTERY DISEASE  NAUSEA  H/O cardiac pacemaker, Medtronic versa implanted 01/2008, though original implanted 2000 for syncope and heart block and ventricular asystole.  Paroxysmal a-fib, history of, off coumadin since 10/2010, without recurrence of afib.  Diastolic dysfunction, grade two  Hypotension  BP swings persist. Will change midodrine to bid. Will adjust ARB to 25 mg in am and 50 mg in pm in this patient with grade 2 diastolic dysfunction.Lennette Bihari, MD, [facility] 02/09/2012, 8:34 AM

## 2012-02-10 MED ORDER — DIPHENHYDRAMINE HCL 25 MG PO CAPS
25.0000 mg | ORAL_CAPSULE | Freq: Every evening | ORAL | Status: DC | PRN
  Administered 2012-02-11 – 2012-02-13 (×3): 25 mg via ORAL
  Filled 2012-02-10 (×3): qty 1

## 2012-02-10 MED ORDER — LOSARTAN POTASSIUM 50 MG PO TABS
50.0000 mg | ORAL_TABLET | Freq: Two times a day (BID) | ORAL | Status: DC
  Administered 2012-02-10 – 2012-02-11 (×2): 50 mg via ORAL
  Filled 2012-02-10 (×3): qty 1

## 2012-02-10 MED ORDER — ACETAMINOPHEN 325 MG PO TABS: 650.0000 mg | ORAL_TABLET | ORAL | Status: DC | PRN

## 2012-02-10 NOTE — Progress Notes (Signed)
[facility] Progress Note  Subjective:  BP continues to be labile  Objective:   Vital Signs in the last 24 hours: Temp:  [97.3 F (36.3 C)-97.8 F (36.6 C)] 97.8 F (36.6 C) (11/10 0445) Pulse Rate:  [70-86] 78  (11/10 0451) Resp:  [17-19] 17  (11/10 0445) BP: (131-202)/(57-80) 140/61 mmHg (11/10 0451) SpO2:  [94 %-100 %] 94 % (11/10 0445)  Intake/Output from previous day: 11/09 0701 - 11/10 0700 In: 360 [P.O.:360] Out: -   Scheduled:   . amiodarone  200 mg Oral Daily  . aspirin  81 mg Oral Daily  . calcium carbonate  1 tablet Oral Daily  . clopidogrel  75 mg Oral Daily  . levothyroxine  75 mcg Oral Daily  . loratadine  10 mg Oral Daily  . losartan  25 mg Oral q morning - 10a  . losartan  50 mg Oral QPM  . meclizine  25 mg Oral BID  . metoprolol tartrate  50 mg Oral QHS  . metoprolol  25 mg Oral Daily  . midodrine  2.5 mg Oral BID WC  . nitrofurantoin (macrocrystal-monohydrate)  100 mg Oral Daily  . pantoprazole  40 mg Oral BID  . ranolazine  500 mg Oral BID  . simvastatin  20 mg Oral QPM  . sodium chloride  3 mL Intravenous Q12H  . traMADol  25 mg Oral QHS    Physical Exam:   General appearance: alert, cooperative and no distress Neck: no JVD Lungs: clear to auscultation bilaterally Heart: S1, S2 normal and 1/6 sem Abdomen: soft, non-tender; bowel sounds normal; no masses,  no organomegaly Extremities: no edema, redness or tenderness in the calves or thighs   Rate: 70  Rhythm: paced  Lab Results:   No results found for this basename: NA:2,K:2,CL:2,CO2:2,GLUCOSE:2,BUN:2,CREATININE:2 in the last 72 hours No results found for this basename: TROPONINI:2,CK,MB:2 in the last 72 hours Hepatic Function Panel No results found for this basename: PROT,ALBUMIN,AST,ALT,ALKPHOS,BILITOT,BILIDIR,IBILI in the last 72 hours No results found for this basename: INR in the last 72 hours  Lipid Panel     Component Value Date/Time   CHOL 150  11/08/2011 0433   TRIG 65 11/08/2011 0433   HDL 61 11/08/2011 0433   CHOLHDL 2.5 11/08/2011 0433   VLDL 13 11/08/2011 0433   LDLCALC 76 11/08/2011 0433     Imaging:  No results found.    Assessment/Plan:   Principal Problem:  *Syncope Active Problems:  HYPOTHYROIDISM  HYPERTENSION  CORONARY ARTERY DISEASE  NAUSEA  H/O cardiac pacemaker, Medtronic versa implanted 01/2008, though original implanted 2000 for syncope and heart block and ventricular asystole.  Paroxysmal a-fib, history of, off coumadin since 10/2010, without recurrence of afib.  Diastolic dysfunction, grade two  Hypotension  BP still labile with peak BP up to 202/72 at 4:45 am.  Mild orthostatic drop to 140/61 with standing. Now on reduced midodrine.  Will increase losartan to 50 mg bid.  Consider evaluation for OSA. Also may need renal dopplers as outpatient.   Lennette Bihari, MD, [facility] 02/10/2012, 11:23 AM

## 2012-02-10 NOTE — Significant Event (Signed)
Rapid Response Event Note  Overview:  Called to see patient for nursing consult. Complaining of dizzyness and mild nausea.     Initial Focused Assessment: Up in chair. No c/o pain. Says her head feels a little swimmy and she is mildly nauseated. VSS. CBG ok. Sat 100 %. Paced HR. Given an ensure to drink-took without problem, however still felt dizzy. Assisted back to bed with and states she feels a bit better.  Interventions: No interventions at this time. Will assist as needed.  Event Summary:   at      at          [name] Luna

## 2012-02-10 NOTE — Progress Notes (Signed)
Huey Bienenstock PA called paged about patients BP this am. 202/72 in lt arm and 190/69 in rt arm lying down. Sitting BP 181/80 and standing 140/61. No new orders given.

## 2012-02-11 LAB — COMPREHENSIVE METABOLIC PANEL
ALT: 14 U/L (ref 0–35)
Albumin: 3 g/dL — ABNORMAL LOW (ref 3.5–5.2)
Alkaline Phosphatase: 150 U/L — ABNORMAL HIGH (ref 39–117)
Potassium: 3.7 mEq/L (ref 3.5–5.1)
Sodium: 138 mEq/L (ref 135–145)
Total Protein: 6.1 g/dL (ref 6.0–8.3)

## 2012-02-11 MED ORDER — MIDODRINE HCL 2.5 MG PO TABS
2.5000 mg | ORAL_TABLET | Freq: Every day | ORAL | Status: DC
  Administered 2012-02-12 – 2012-02-15 (×4): 2.5 mg via ORAL
  Filled 2012-02-11 (×5): qty 1

## 2012-02-11 MED ORDER — LOSARTAN POTASSIUM 25 MG PO TABS
25.0000 mg | ORAL_TABLET | Freq: Every day | ORAL | Status: DC
  Administered 2012-02-12 – 2012-02-15 (×4): 25 mg via ORAL
  Filled 2012-02-11 (×4): qty 1

## 2012-02-11 MED ORDER — LOSARTAN POTASSIUM 50 MG PO TABS
50.0000 mg | ORAL_TABLET | Freq: Every day | ORAL | Status: DC
  Administered 2012-02-11 – 2012-02-14 (×4): 50 mg via ORAL
  Filled 2012-02-11 (×5): qty 1

## 2012-02-11 NOTE — Progress Notes (Signed)
UR Completed.  [name] Luna Jane [phone] 02/11/2012  

## 2012-02-11 NOTE — Progress Notes (Signed)
[employer] paged and informed of patients BP 208/71. No new orders at this time. Decisions to be made on rounds.

## 2012-02-11 NOTE — Progress Notes (Addendum)
Subjective: Talked with Pt and son.  She is becoming weaker.  Very orthorstatic today.     Objective: Vital signs in last 24 hours: Temp:  [97.6 F (36.4 C)-98.3 F (36.8 C)] 97.8 F (36.6 C) (11/11 0947) Pulse Rate:  [70-88] 71  (11/11 1142) Resp:  [18-19] 19  (11/11 0443) BP: (83-208)/(49-73) 128/64 mmHg (11/11 1142) SpO2:  [94 %-99 %] 95 % (11/11 0947) Weight change:  Last BM Date: 02/10/12 Intake/Output from previous day: +360 11/10 0701 - 11/11 0700 In: 360 [P.O.:360] Out: -  Intake/Output this shift: Total I/O In: 240 [P.O.:240] Out: -   PE: General:alert and oriented, no complaints now  Heart:S1S2 RRR Lungs:clear Abd:+ BS soft, non tender Ext:no edema    Lab Results: No results found for this basename: WBC:2,HGB:2,HCT:2,PLT:2 in the last 72 hours BMET  [facility] 02/11/12 0515  NA 138  K 3.7  CL 102  CO2 28  GLUCOSE 93  BUN 17  CREATININE 0.79  CALCIUM 8.7   No results found for this basename: TROPONINI:2,CK,MB:2 in the last 72 hours  Lab Results  Component Value Date   CHOL 150 11/08/2011   HDL 61 11/08/2011   LDLCALC 76 11/08/2011   TRIG 65 11/08/2011   CHOLHDL 2.5 11/08/2011   Lab Results  Component Value Date   HGBA1C 5.3 01/20/2012     Lab Results  Component Value Date   TSH 2.178 01/20/2012    Hepatic Function Panel  Basename 02/11/12 0515  PROT 6.1  ALBUMIN 3.0*  AST 21  ALT 14  ALKPHOS 150*  BILITOT 0.5  BILIDIR --  IBILI --     Studies/Results: No results found.  Medications: I have reviewed the patient's current medications.    [name] amiodarone  200 mg Oral Daily  . aspirin  81 mg Oral Daily  . calcium carbonate  1 tablet Oral Daily  . clopidogrel  75 mg Oral Daily  . levothyroxine  75 mcg Oral Daily  . loratadine  10 mg Oral Daily  . losartan  50 mg Oral BID  . meclizine  25 mg Oral BID  . metoprolol tartrate  50 mg Oral QHS  . metoprolol  25 mg Oral Daily  . midodrine  2.5 mg Oral BID WC  . nitrofurantoin  (macrocrystal-monohydrate)  100 mg Oral Daily  . pantoprazole  40 mg Oral BID  . ranolazine  500 mg Oral BID  . simvastatin  20 mg Oral QPM  . sodium chloride  3 mL Intravenous Q12H  . traMADol  25 mg Oral QHS   Assessment/Plan: Principal Problem:  *Syncope Active Problems:  CORONARY ARTERY DISEASE  H/O cardiac pacemaker, Medtronic versa implanted 01/2008, though original implanted 2000 for syncope and heart block and ventricular asystole.  Paroxysmal a-fib, history of, off coumadin since 10/2010, without recurrence of afib.  HYPOTHYROIDISM  HYPERTENSION  NAUSEA  Diastolic dysfunction, grade two  Hypotension  PLAN: No ProAmatine has been given in 5 days.  BP at HS up to 200 systolic.   Today she could not really walk , much worse now than last week.  She continues to lean back.  While she has had syncope in the past and orthostatic hypotension she has never had this much difficulty.  All of this increased after head injury with syncope.  CT of head has been oK ?neuro consult. Will ask PT to see again  For vestibular eval. And ambulation.  Will change am cozaar to 25 leave pm dose at 50 secondary  to night time hypertension that frequently is symptomatic.  Add TED stockings knee high which she does at home-in addition to SCDs while in the bed.     LOS: 5 days   INGOLD,LAURA R 02/11/2012, 12:40 PM  I seen and evaluated the patient this morning along with the PA/NP. I agree with their findings, examination as well as impression recommendations.  We discussed the plan as described above.  Very difficult situation -- agree that we may need Neurology consultation.  When I saw her tonight without her son Peyton Najjar present, she was very confused - says she feels as though she is in a daze.  RN noted possible hallucinations. Has intention tremor but normal finger to nose.    Will check UA tonite.  Neurology consultation in AM.  This seems like a noticeable change, but I have not seen her  without her son being present to possibly re-direct her.   Marykay Lex, M.D., M.S. [facility] [address] [place], [place]  [address]  [phone] Pager # [phone] 02/11/2012 7:32 PM

## 2012-02-11 NOTE — Progress Notes (Signed)
On assessment patient was noted to be delusional with intermittent episodes of hallucinations. She appeared nervous and hands were shaky. Was able to recall current location but unaware of date, time and situation.Redirected patient and brightened up the room. Dr. Herbie Baltimore rounded on the patient and requested we get a UA. Will continue to monitor per shift.

## 2012-02-11 NOTE — Progress Notes (Signed)
Assisting Pt back to bed from [facility], Pt experienced sensation of "falling" when being slid up in bed.

## 2012-02-11 NOTE — Progress Notes (Signed)
CARDIAC REHAB PHASE I   PRE:  Rate/Rhythm: pacing 80  BP:  Supine: 130/60  Sitting: 160/72  Standing: 128/66   SaO2: 96%RA  MODE:  Ambulation: few steps ft   POST:  Rate/Rhythem: 90  BP:  Supine: 150/70  Sitting:   Standing:    SaO2: 93%RA 1023-1048 Attempted ambulation without success. Pt leans backward, feet slipping and c/o dizziness. Pt's tremor much worse. Pt states afraid she is going to fall. Let pt sit for quite a long time before we tried to walk. Pt much weaker. Needs PT consult to evaluate strength and home needs.  [name] Luna

## 2012-02-11 NOTE — Progress Notes (Signed)
Patient reported having Right side head pain behind the ear. Stated that the pain is "like never before". She also reported pain in her epigastric area and describes the pain as a grab and release type of pain. Its radiating from left to right. Patient is still very confused and reports feelings of dizziness and being in a daze. BP and respirations are noted to be elevated. Called son Peyton Najjar from my work phone prior to these complaints so the patient could speak to him. She calmed down for a while before having these compliants. Explained to son that Dr. Herbie Baltimore is aware of her confusion and that we will be taking a urine sample for evaluation. Paged on call cardiologist Dr. Evelina Dun" to make him aware of the above findings. No new new orders. Ok to give 2200 scheduled meds.

## 2012-02-12 ENCOUNTER — Encounter (HOSPITAL_COMMUNITY): Payer: Self-pay | Admitting: General Practice

## 2012-02-12 LAB — URINALYSIS, ROUTINE W REFLEX MICROSCOPIC
Bilirubin Urine: NEGATIVE
Glucose, UA: NEGATIVE mg/dL
Ketones, ur: NEGATIVE mg/dL
Leukocytes, UA: NEGATIVE
Specific Gravity, Urine: 1.014 (ref 1.005–1.030)
pH: 7.5 (ref 5.0–8.0)

## 2012-02-12 LAB — URINE MICROSCOPIC-ADD ON

## 2012-02-12 NOTE — Progress Notes (Signed)
1610 Read notes from last night. Will let PT evaluate before we resume seeing pt. Will continue to follow.Kinzie Wickes DunlapRN

## 2012-02-12 NOTE — Evaluation (Signed)
Physical Therapy Evaluation Patient Details Name: [name] Luna MRN: [id number] DOB: Aug 21, 1920 Today's Date: 02/12/2012 Time: 4098-1191 PT Time Calculation (min): 38 min  PT Assessment / Plan / Recommendation Clinical Impression  pt presents after another syncopal episode.  She also vaguely describes sense of dizziness which has increased her anxiety and fear.  Will do basic vestibular testing to check for BPPV and work on  pt's decr balance, act. tolerance and deconditioning.    PT Assessment  Patient needs continued PT services    Follow Up Recommendations  Home health PT;Supervision/Assistance - 24 hour    Does the patient have the potential to tolerate intense rehabilitation      Barriers to Discharge None      Equipment Recommendations  None recommended by PT    Recommendations for Other Services     Frequency Min 3X/week    Precautions / Restrictions Precautions Precautions: Fall Precaution Comments: ppt passed out in the kitchen at breakfast, no fall   Pertinent Vitals/Pain       Mobility  Bed Mobility Bed Mobility: Rolling Right;Rolling Left;Right Sidelying to Sit;Sit to Sidelying Right Rolling Right: 3: Mod assist Rolling Left: 4: Min assist Right Sidelying to Sit: 2: Max assist Sit to Sidelying Right: 2: Max assist;HOB flat Details for Bed Mobility Assistance: Dizziness occurs with rolling R, not L.  Dizziness coming up from R side.  Vc's for hand placement and to reassure her that she would not fall; truncal assist/support Transfers Transfers: Sit to Stand;Stand to Sit Sit to Stand: 3: Mod assist;With upper extremity assist;From bed Stand to Sit: 3: Mod assist;With upper extremity assist;To bed Details for Transfer Assistance: vc/tc's for hand placement, technique; mod assist to help translate forward over her BOS Ambulation/Gait Ambulation/Gait Assistance: Not tested (comment) Stairs: No Wheelchair Mobility Wheelchair Mobility: No    Shoulder  Instructions     Exercises     PT Diagnosis: Generalized weakness;Difficulty walking  PT Problem List: Decreased strength;Decreased activity tolerance;Decreased balance;Decreased mobility;Decreased coordination;Decreased knowledge of use of DME PT Treatment Interventions: DME instruction;Gait training;Stair training;Functional mobility training;Therapeutic activities;Balance training;Patient/family education   PT Goals Acute Rehab PT Goals PT Goal Formulation: With patient/family Time For Goal Achievement: 02/26/12 Potential to Achieve Goals: Good Pt will go Supine/Side to Sit: with supervision PT Goal: Supine/Side to Sit - Progress: Goal set today Pt will go Sit to Supine/Side: with supervision PT Goal: Sit to Supine/Side - Progress: Goal set today Pt will go Sit to Stand: with supervision PT Goal: Sit to Stand - Progress: Goal set today Pt will go Stand to Sit: with supervision PT Goal: Stand to Sit - Progress: Goal set today Pt will Ambulate: 16 - 50 feet;with supervision;with least restrictive assistive device PT Goal: Ambulate - Progress: Goal set today Pt will Go Up / Down Stairs: 1-2 stairs;with min assist PT Goal: Up/Down Stairs - Progress: Goal set today  Visit Information  Last PT Received On: 02/12/12 Assistance Needed: +2    Subjective Data  Subjective: I'm dizzy when I move.... and  I'm scared of falling Patient Stated Goal: return home with son   Prior Functioning  Home Living Lives With: Son Available Help at Discharge: Family;Available 24 hours/day Type of Home: House Home Access: Stairs to enter [employer] of Steps: 2 Entrance Stairs-Rails: None Home Layout: One level Bathroom Shower/Tub: Tub/shower unit;Other (comment) (takes sponge baths) Bathroom Toilet: Standard Bathroom Accessibility: Yes How Accessible: Accessible via walker Home Adaptive Equipment: Bedside commode/3-in-1;Walker - rolling;Straight cane Prior Function Level  of  Independence: Needs assistance Needs Assistance: Light Housekeeping;Meal Prep;Bathing Bath: Other (comment) Meal Prep: Minimal Light Housekeeping: Moderate Able to Take Stairs?: Yes Driving: No Vocation: Retired Musician: H[employer] Dominant Hand: Right    Cognition  Overall Cognitive Status: History of cognitive impairments - at baseline Arousal/Alertness: Awake/alert Orientation Level: Appears intact for tasks assessed Behavior During Session: [facility] for tasks performed    Extremity/Trunk Assessment Right Upper Extremity Assessment RUE Coordination Deficits: benign essential tremors make fine motor skills difficult Right Lower Extremity Assessment RLE ROM/Strength/Tone: Deficits RLE ROM/Strength/Tone Deficits: knee ext 4/5, knee flex 4/5, hip flex 3+/5, difficulty with ptolonged contraction and multiple repetitions such as needed for independent functional mobility RLE Sensation: WFL - Light Touch RLE Coordination: WFL - gross motor Left Lower Extremity Assessment LLE ROM/Strength/Tone: Deficits LLE ROM/Strength/Tone Deficits: same as RLE LLE Sensation: WFL - Light Touch LLE Coordination: WFL - gross motor Trunk Assessment Trunk Assessment: Normal   Balance Balance Balance Assessed: Yes Static Sitting Balance Static Sitting - Balance Support: Right upper extremity supported;Left upper extremity supported;Feet supported Static Sitting - Level of Assistance: 3: Mod assist;4: Min assist;Other (comment) (relaxs some as the sense of fall decreases) Static Sitting - Comment/# of Minutes: 10 Static Standing Balance Static Standing - Balance Support: Right upper extremity supported;Left upper extremity supported;During functional activity Static Standing - Level of Assistance: 3: Mod assist  End of Session PT - End of Session Activity Tolerance: Patient tolerated treatment well Patient left: in bed;with call bell/phone within reach;with family/visitor present Nurse  Communication: Mobility status  GP     Eliazar Olivar, Eliseo Gum 02/12/2012, 3:57 PM  02/12/2012  [place] Bing, PT [phone] (pager)

## 2012-02-12 NOTE — Progress Notes (Signed)
Subjective: Less confused; not as dizzy / foggy No CP or SOB.  Objective: Vital signs in last 24 hours: Temp:  [97.5 F (36.4 C)-98.9 F (37.2 C)] 98.9 F (37.2 C) (11/12 1200) Pulse Rate:  [65-92] 86  (11/12 1200) Resp:  [18-24] 18  (11/12 0531) BP: (125-195)/(59-110) 188/77 mmHg (11/12 1200) SpO2:  [94 %-100 %] 94 % (11/12 0850) Weight change:  Last BM Date: 02/10/12 Intake/Output from previous day: +480 11/11 0701 - 11/12 0700 In: 480 [P.O.:480] Out: -  Intake/Output this shift: Total I/O In: 156 [P.O.:150; I.V.:6] Out: -   PE: General appearance: alert and less confused today - knows who I am,where she is & what month it is. Neck: no carotid bruit, no JVD and supple, symmetrical, trachea midline Lungs: clear to auscultation bilaterally and normal percussion bilaterally Heart: regular rate and rhythm, S1, S2 normal, no murmur, click, rub or gallop Abdomen: soft, non-tender; bowel sounds normal; no masses,  no organomegaly Extremities: extremities normal, atraumatic, no cyanosis or edema Neurologic: Mental status: alertness: alert, orientation: date, person, place, city, president, affect: blunted and redirectable   Lying  BP 195/68 Sitting  BP 158/68 Standing  BP  153/79   Lab Results: No results found for this basename: WBC:2,HGB:2,HCT:2,PLT:2 in the last 72 hours BMET  [facility] 02/11/12 0515  NA 138  K 3.7  CL 102  CO2 28  GLUCOSE 93  BUN 17  CREATININE 0.79  CALCIUM 8.7   No results found for this basename: TROPONINI:2,CK,MB:2 in the last 72 hours  Lab Results  Component Value Date   CHOL 150 11/08/2011   HDL 61 11/08/2011   LDLCALC 76 11/08/2011   TRIG 65 11/08/2011   CHOLHDL 2.5 11/08/2011   Lab Results  Component Value Date   HGBA1C 5.3 01/20/2012     Lab Results  Component Value Date   TSH 2.178 01/20/2012    Hepatic Function Panel  Basename 02/11/12 0515  PROT 6.1  ALBUMIN 3.0*  AST 21  ALT 14  ALKPHOS 150*  BILITOT 0.5  BILIDIR --    IBILI --     Studies/Results: No results found.  Medications: I have reviewed the patient's current medications.    [name] amiodarone  200 mg Oral Daily  . aspirin  81 mg Oral Daily  . calcium carbonate  1 tablet Oral Daily  . clopidogrel  75 mg Oral Daily  . levothyroxine  75 mcg Oral Daily  . loratadine  10 mg Oral Daily  . losartan  25 mg Oral Daily  . losartan  50 mg Oral QHS  . meclizine  25 mg Oral BID  . metoprolol tartrate  50 mg Oral QHS  . metoprolol  25 mg Oral Daily  . midodrine  2.5 mg Oral QAC breakfast  . nitrofurantoin (macrocrystal-monohydrate)  100 mg Oral Daily  . pantoprazole  40 mg Oral BID  . ranolazine  500 mg Oral BID  . simvastatin  20 mg Oral QPM  . sodium chloride  3 mL Intravenous Q12H  . traMADol  25 mg Oral QHS  . [DISCONTINUED] losartan  50 mg Oral BID  . [DISCONTINUED] midodrine  2.5 mg Oral BID WC   Assessment/Plan: Principal Problem:  *Syncope, secondary to orthostatic hypotension Active Problems:  CORONARY ARTERY DISEASE  H/O cardiac pacemaker, [employer]tr[employer] versa implanted 01/2008, though original implanted 2000 for syncope and heart block and ventricular asystole.  Paroxysmal a-fib, history of, off coumadin since 10/2010, without recurrence of afib.  HYPOTHYROIDISM  HYPERTENSION  NAUSEA  Diastolic dysfunction, grade two  Hypotension  PLAN: Hallucinations last pm.  U/a neg nitrates, neg leukocytes.  Pt having difficulty walking partially due to anxiety,   LOS: 6 days   INGOLD,LAURA R 02/12/2012, 12:19 PM  I seen and evaluated the patient this morning along with the PA/NP. I agree with their findings, examination as well as impression recommendations.  Pressures more stable today - still a bit high, but no orthostatic findings.   Plan is to continue 25 - 50mg  Losartan (AM-PM), 50 bib Metoprolol, Midodrine qAM & 25mg  Hydralazine q12hr prn HTN (SBP>180).  I think her altered mental status is simply uncovering some baseline dementia -  after a few min with son in the room, was much more lucid.  Will consult Neurology re: intention tremor, autonomic dysfunction (related to orthostatic hypotension) & altered MS -- may need Psychiatric eval as well.  Very anxious - at risk for Sundowning -- I suspect much of her HTN swings are related to underlying anxiety.  No sign of UTI.  Despite HTN episodes - no SSx of HF or Angina since adding Ranexa.   Marykay Lex, M.D., M.S. [facility] [address] [place], [place]  [address]  [phone] Pager # [phone] 02/12/2012 3:55 PM

## 2012-02-12 NOTE — Consult Note (Signed)
Reason for Consult: syncope Referring Physician: Dr. Herbie Baltimore  CC: syncope  HPI: [name] Luna is an 78 y.o. female who experienced a syncopal spell while sitting in chair. Son witnessed event. She had been sitting for 45 minutes and passed out for 2-3 seconds. No body movements noted. No post confusion. She was admitted for similar 01/21/2012, dx with orthostatic hypotension. We were consulted on this patient on 01/23/2012 with recommendations of outpatient follow up. She does have hypertension, but because of bp swings must be maintained with midodrine. CT at that time showed scalp hematoma after fall, otherwise no acute internal pathology. No CT this admission. Cannot have MRI due to PACEMAKER.  EPIC telephone call from home physical therapist on date of admission states that the patient had a BP of 65/36 with a "blank stare". Then BP rebounding to 144/72. Dizzy. Referred to call 911.   Over the past 24 hours her son feels that she is confused. He states that she appears weaker and is less steady on her feet. This is also documented by physical therapy. Patient now with"tremors" per son. He states that he has lived with her for 4 years since her husband has died so that she would not be alone. She has not shown any signs of dementia or confusion prior to admission.    Concerns are that some of her blood pressure swings are due to underlying anxiety issues.   Orthostatic blood pressures:  systolic 202 down to 181, then 140.  Lying:             195/68   P-73 Sitting:            158/68  P-92 Standing:        153/79  P-92   Past Medical History  Diagnosis Date  . CHF (congestive heart failure)   . A-fib   . Hypothyroid   . GERD (gastroesophageal reflux disease)   . Hypothyroidism   . Bradycardia   . Pancreatitis   . Coronary artery disease   . Shingles 1980's    "twice; once on my front side; once on my back" (02/07/2012)  . CORONARY ARTERY DISEASE 11/28/2006    Qualifier: Diagnosis of   By: Tawanna Cooler MD, Eugenio Hoes   . Pacemaker   . Paroxysmal a-fib, history of 08/02/2011  . Tremor, essential 08/02/2011  . Esophageal disorder, with history of dilatation 08/02/2011  . Angina   . H/O hiatal hernia   . Neuromuscular disorder     essential tremors  . UTI (lower urinary tract infection) 08/03/2011  . Dizziness, near syncope 11/07/2011  . Macular degeneration, dry     "both eyes" (02/07/2012)  . Hypertension   . HTN (hypertension), accelerated this admit 11/08/2011  . Pneumonia 1927    Past Surgical History  Procedure Date  . Repair spigelian hernia   . S/p childbirth     x2  . Cataract extraction w/ intraocular lens  implant, bilateral ~ 1997  . Coronary angioplasty with stent placement 2008  . Vaginal hysterectomy 1970's  . Left oophorectomy 1970's  . Insert / replace / remove pacemaker 2000; 2009    initial placement; replaced  . Appendectomy 1933  . Cholecystectomy 1950's  . Hernia repair   . Esophageal dilation     "once" (02/07/2012)    Family History  Problem Relation Age of Onset  . Sudden death Mother     Social History:  reports that she has never smoked. She has never used smokeless  tobacco. She reports that she does not drink alcohol or use illicit drugs.  Allergies  Allergen Reactions  . Codeine Other (See Comments)    Passes out  . Isosorbide Mononitrate Other (See Comments)    fainted  . Lorazepam Other (See Comments)    hallucinations  . Penicillins Rash  . Prednisone Nausea And Vomiting  . Valium (Diazepam) Other (See Comments)    Reaction unknown    Medications:  Prior to Admission:  Prescriptions prior to admission  Medication Sig Dispense Refill  . amiodarone (PACERONE) 200 MG tablet Take 200 mg by mouth daily.        [name] aspirin 81 MG EC tablet Take 81 mg by mouth daily.        . calcium carbonate (TUMS - DOSED IN MG ELEMENTAL CALCIUM) 500 MG chewable tablet Chew 1 tablet by mouth daily.       . clopidogrel (PLAVIX) 75 MG tablet Take 75 mg  by mouth daily.        [name] levothyroxine (SYNTHROID, LEVOTHROID) 75 MCG tablet Take 75 mcg by mouth daily.      [name] loratadine (CLARITIN) 10 MG tablet Take 10 mg by mouth daily.        [name] losartan (COZAAR) 50 MG tablet Take 50 mg by mouth daily.      . meclizine (ANTIVERT) 25 MG tablet Take 25 mg by mouth 2 (two) times daily as needed. For dizziness      . metoprolol (LOPRESSOR) 50 MG tablet Take 50 mg by mouth 2 (two) times daily.      . midodrine (PROAMATINE) 2.5 MG tablet Take 2.5 mg by mouth 3 (three) times daily with meals.      . Multiple Vitamin (MULTIVITAMIN) capsule Take 1 capsule by mouth daily.        . Multiple Vitamins-Minerals (PRESERVISION/LUTEIN) CAPS Take by mouth 2 (two) times daily.        . nitrofurantoin, macrocrystal-monohydrate, (MACROBID) 100 MG capsule Take 100 mg by mouth daily. For 6 weeks  Started 02/02/2012      . nitroGLYCERIN (NITROSTAT) 0.4 MG SL tablet Place 0.4 mg under the tongue every 5 (five) minutes as needed. For chest pain      . pantoprazole (PROTONIX) 40 MG tablet Take 40 mg by mouth 2 (two) times daily.      . polyethylene glycol (MIRALAX / GLYCOLAX) packet Take 17 g by mouth daily as needed. For constipation      . ranolazine (RANEXA) 500 MG 12 hr tablet Take 500 mg by mouth 2 (two) times daily.      . simvastatin (ZOCOR) 20 MG tablet Take 20 mg by mouth every evening.      . traMADol (ULTRAM) 50 MG tablet Take 25 mg by mouth at bedtime.        Scheduled:   . amiodarone  200 mg Oral Daily  . aspirin  81 mg Oral Daily  . calcium carbonate  1 tablet Oral Daily  . clopidogrel  75 mg Oral Daily  . levothyroxine  75 mcg Oral Daily  . loratadine  10 mg Oral Daily  . losartan  25 mg Oral Daily  . losartan  50 mg Oral QHS  . meclizine  25 mg Oral BID  . metoprolol tartrate  50 mg Oral QHS  . metoprolol  25 mg Oral Daily  . midodrine  2.5 mg Oral QAC breakfast  . nitrofurantoin (macrocrystal-monohydrate)  100 mg Oral Daily  . pantoprazole  40  mg Oral BID    . ranolazine  500 mg Oral BID  . simvastatin  20 mg Oral QPM  . sodium chloride  3 mL Intravenous Q12H  . traMADol  25 mg Oral QHS   ROS: History obtained from child, chart review and the patient  General ROS: negative for - chills, fatigue, fever, night sweats, weight gain or weight loss Psychological ROS: negative for - behavioral disorder,, memory difficulties, mood swings or suicidal ideation,    +hallucinations Ophthalmic ROS: negative for - blurry vision, double vision, eye pain or loss of vision ENT ROS: negative for - epistaxis, nasal discharge, oral lesions, sore throat, tinnitus, + vertigo Allergy and Immunology ROS: negative for - hives or itchy/watery eyes Hematological and Lymphatic ROS: negative for - bleeding problems, bruising or swollen lymph nodes Endocrine ROS: negative for - galactorrhea, hair pattern changes, polydipsia/polyuria or temperature intolerance Respiratory ROS: negative for - cough, hemoptysis, shortness of breath or wheezing Cardiovascular ROS: negative for - chest pain, dyspnea on exertion, edema or irregular heartbeat Gastrointestinal ROS: negative for - abdominal pain, diarrhea, hematemesis, nausea/vomiting or stool incontinence Genito-Urinary ROS: negative for - dysuria, hematuria, incontinence or urinary frequency/urgency Musculoskeletal ROS: negative for - joint swelling or muscular weakness Neurological ROS: as noted in HPI Dermatological ROS: negative for rash and skin lesion changes   Physical Examination: Blood pressure 150/51, pulse 80, temperature 98.7 F (37.1 C), temperature source Oral, resp. rate 18, height 5\' 4"  (1.626 m), weight 66.3 kg (146 lb 2.6 oz), SpO2 99.00%.  Skin: warm, dry, pacemaker right upper chest Lungs: clear bilaterally Heart: regular Ext: no edema  Neurologic Examination Mental Status: Alert, not oriented. States her age as 51. Month as December. Speech fluent without evidence of aphasia.  Able to follow 3 step  commands without difficulty. Cranial Nerves: II: visual fields grossly normal, pupils equal, round, reactive to light and accommodation III,IV, VI: ptosis not present, extra-ocular motions intact bilaterally V,VII: smile symmetric, facial light touch sensation normal bilaterally VIII: hearing normal bilaterally IX,X: gag reflex present XI: trapezius strength/neck flexion strength normal bilaterally XII: tongue strength normal  Motor: Right : Upper extremity   5/5    Left:     Upper extremity   5/5  Lower extremity   5/5     Lower extremity   5/5 Tone and bulk:normal tone throughout; no atrophy noted Sensory: Pinprick and light touch intact throughout, bilaterally Deep Tendon Reflexes: 1+ UE throughout. Bilateral palmomental. Plantars: Right: downgoing   Left: downgoing Cerebellar: Patient has macular degeneration. She was able to preform finger to nose. Does have benign essential tremor.      Results for orders placed during the hospital encounter of 02/06/12 (from the past 48 hour(s))  COMPREHENSIVE METABOLIC PANEL     Status: Abnormal   Collection Time   02/11/12  5:15 AM      Component Value Range Comment   Sodium 138  135 - 145 mEq/L    Potassium 3.7  3.5 - 5.1 mEq/L    Chloride 102  96 - 112 mEq/L    CO2 28  19 - 32 mEq/L    Glucose, Bld 93  70 - 99 mg/dL    BUN 17  6 - 23 mg/dL    Creatinine, Ser 4.09  0.50 - 1.10 mg/dL    Calcium 8.7  8.4 - 81.1 mg/dL    Total Protein 6.1  6.0 - 8.3 g/dL    Albumin 3.0 (*) 3.5 - 5.2 g/dL  AST 21  0 - 37 U/L    ALT 14  0 - 35 U/L    Alkaline Phosphatase 150 (*) 39 - 117 U/L    Total Bilirubin 0.5  0.3 - 1.2 mg/dL    GFR calc non Af Amer 72 (*) >90 mL/min    GFR calc Af Amer 83 (*) >90 mL/min   URINALYSIS, ROUTINE W REFLEX MICROSCOPIC     Status: Abnormal   Collection Time   02/12/12  5:26 AM      Component Value Range Comment   Color, Urine YELLOW  YELLOW    APPearance CLOUDY (*) CLEAR    Specific Gravity, Urine 1.014  1.005 -  1.030    pH 7.5  5.0 - 8.0    Glucose, UA NEGATIVE  NEGATIVE mg/dL    Hgb urine dipstick SMALL (*) NEGATIVE    Bilirubin Urine NEGATIVE  NEGATIVE    Ketones, ur NEGATIVE  NEGATIVE mg/dL    Protein, ur NEGATIVE  NEGATIVE mg/dL    Urobilinogen, UA 0.2  0.0 - 1.0 mg/dL    Nitrite NEGATIVE  NEGATIVE    Leukocytes, UA NEGATIVE  NEGATIVE   URINE MICROSCOPIC-ADD ON     Status: Abnormal   Collection Time   02/12/12  5:26 AM      Component Value Range Comment   Squamous Epithelial / LPF FEW (*) RARE    WBC, UA 0-2  <3 WBC/hpf    RBC / HPF 3-6  <3 RBC/hpf    Bacteria, UA FEW (*) RARE    Urine-Other AMORPHOUS URATES/PHOSPHATES      TSH  2.1  CXR 02/06/2012 Cardiomegaly and small bilateral pleural effusions compatible with mild CHF  ECHO 01/21/2012 EF 55-60%, no WMA, diastolic dysfunction, no PFO identified.  CT HEAD 01/23/2012 Bilateral posterior convexity scalp hematomas without underlying fracture. No acute intracranial abnormality.   Job Founds, MBA, MHA [facility] Pager [phone]   Patient seen and examined.  Clinical course and management discussed.  Necessary edits performed.  I agree with the above.  Assessment and plan of care developed and discussed below.    Assessment: 78 year old female that continues to be orthostatic and have syncopal episodes.  She has a tremor as well and there has been some question as to whether she may have a Shy-Drager Syndrome.  The patient has a tremor that is not typical for a parkinsonian syndrome and other features of her exam suggest more of a vascular nature to her symptomatology.  Patient has had imaging in the past that was not revealing.  Does have a pacemaker, afib and unable to be on Coumadin.  With her findings on exam can not rule out that she may be showering emboli.  Unfortunately we can not be more aggressive with her anticoagulation.  Despite the etiology she is still left with the issue of dealing with her  orthostasis.  She has not responded well to Midodrine, ted hose or other supportive measures.    Plan: 1.  May want to consider Florinef or a stimulant as a next alternative   Thana Farr, MD [facility] [phone]  02/12/2012  6:42 PM

## 2012-02-12 NOTE — Clinical Documentation Improvement (Signed)
CHF DOCUMENTATION CLARIFICATION QUERY  THIS DOCUMENT IS NOT A PERMANENT PART OF THE MEDICAL RECORD  TO RESPOND TO THE THIS QUERY, FOLLOW THE INSTRUCTIONS BELOW:  1. If needed, update documentation for the patient's encounter via the notes activity.  2. Access this query again and click edit on the In [employer].  3. After updating, or not, click F2 to complete all highlighted (required) fields concerning your review. Select "additional documentation in the medical record" OR "no additional documentation provided".  4. Click Sign note button.  5. The deficiency will fall out of your In Basket *Please let us know if you are not able to complete this workflow by phone or e-mail (listed below).  Please update your documentation within the medical record to reflect your response to this query.                                                                                    02/12/12  Dear Dr.[facility],  In a better effort to capture your patient's severity of illness, reflect appropriate length of stay and utilization of resources, a review of the patient medical record has revealed the following indicators the diagnosis of Heart Failure.    Based on your clinical judgment, please clarify and document in a progress note and/or discharge summary the clinical condition associated with the following supporting information:  In responding to this query please exercise your independent judgment.  The fact that a query is asked, does not imply that any particular answer is desired or expected.   Possible Clinical Conditions:  Chronic Diastolic Congestive Heart Failure  Acute Diastolic Congestive Heart Failure  Acute on Chronic Diastolic Congestive Heart Failure  Other Condition  Cannot Clinically Determine   Supporting Information:  Risk Factors: Diastolic dysfunction, grade two noted per 11/12 progress notes.  Diagnostics: 11/06: proBNP: 931.5   Reviewed:  additional documentation in the medical record -- no active SSx of since initial admission.  Thank You,  Marciano Sequin,  Clinical Documentation Specialist:  Pager: [phone]  Phone: [phone]  Health Information Management [place]

## 2012-02-13 MED ORDER — HALOPERIDOL LACTATE 5 MG/ML IJ SOLN
0.5000 mg | Freq: Four times a day (QID) | INTRAMUSCULAR | Status: DC | PRN
  Filled 2012-02-13: qty 0.1

## 2012-02-13 MED ORDER — HALOPERIDOL LACTATE 5 MG/ML IJ SOLN
1.0000 mg | Freq: Once | INTRAMUSCULAR | Status: DC
  Filled 2012-02-13: qty 0.2

## 2012-02-13 NOTE — Progress Notes (Signed)
Subjective:   Objective: Vital signs in last 24 hours: Temp:  [97.2 F (36.2 C)-98.9 F (37.2 C)] 97.2 F (36.2 C) (11/13 0640) Pulse Rate:  [73-95] 95  (11/13 0653) Resp:  [16-18] 16  (11/13 0640) BP: (150-188)/(51-133) 157/133 mmHg (11/13 0653) SpO2:  [95 %-99 %] 96 % (11/13 0640) Weight change:  Last BM Date: 02/10/12 Intake/Output from previous day: +636 11/12 0701 - 11/13 0700 In: 636 [P.O.:630; I.V.:6] Out: -  Intake/Output this shift: Total I/O In: 240 [P.O.:240] Out: -   PE: General:alert, oriented X 3 to questions then starts talking about the spread being her purse, fidgeting also Heart:S1S2 RRR at to,es HR in the 106 or so Lungs:clear without rales Abd:+ BS, soft, non tender Ext:no edema   BMET  Basename 02/11/12 0515  NA 138  K 3.7  CL 102  CO2 28  GLUCOSE 93  BUN 17  CREATININE 0.79  CALCIUM 8.7   No results found for this basename: TROPONINI:2,CK,MB:2 in the last 72 hours  Lab Results  Component Value Date   CHOL 150 11/08/2011   HDL 61 11/08/2011   LDLCALC 76 11/08/2011   TRIG 65 11/08/2011   CHOLHDL 2.5 11/08/2011   Lab Results  Component Value Date   HGBA1C 5.3 01/20/2012     Lab Results  Component Value Date   TSH 2.178 01/20/2012    Hepatic Function Panel  Basename 02/11/12 0515  PROT 6.1  ALBUMIN 3.0*  AST 21  ALT 14  ALKPHOS 150*  BILITOT 0.5  BILIDIR --  IBILI --    Studies/Results: No results found.  Medications: I have reviewed the patient's current medications.    [name] amiodarone  200 mg Oral Daily  . aspirin  81 mg Oral Daily  . calcium carbonate  1 tablet Oral Daily  . clopidogrel  75 mg Oral Daily  . levothyroxine  75 mcg Oral Daily  . loratadine  10 mg Oral Daily  . losartan  25 mg Oral Daily  . losartan  50 mg Oral QHS  . meclizine  25 mg Oral BID  . metoprolol tartrate  50 mg Oral QHS  . metoprolol  25 mg Oral Daily  . midodrine  2.5 mg Oral QAC breakfast  . nitrofurantoin (macrocrystal-monohydrate)  100 mg  Oral Daily  . pantoprazole  40 mg Oral BID  . ranolazine  500 mg Oral BID  . simvastatin  20 mg Oral QPM  . sodium chloride  3 mL Intravenous Q12H  . traMADol  25 mg Oral QHS   Assessment/Plan: Principal Problem:  *Syncope, secondary to orthostatic hypotension Active Problems:  CORONARY ARTERY DISEASE  H/O cardiac pacemaker, Medtronic versa implanted 01/2008, though original implanted 2000 for syncope and heart block and ventricular asystole.  Paroxysmal a-fib, history of, off coumadin since 10/2010, without recurrence of afib.  HYPOTHYROIDISM  HYPERTENSION  NAUSEA  Diastolic dysfunction, grade two  Hypotension  PLAN: BP 152'91 lying --sitting 157/133???- will recheck did receive ProAmatine today See Neurology note.  BP overall improved. Confused, can answer questions approp but then stated her bed spread was her purse.   Her son cannot care for her at home with the confusion.  Will need SNF for rehab.   LOS: 7 days   Flossie Wexler R 02/13/2012, 9:38 AM

## 2012-02-13 NOTE — Progress Notes (Signed)
Pt became agitated and trying to climb out of bed per son. MD notified and ordered Haldol prn. Upon assessment of patient, she is now calm and resting quietly. Will not administer Haldol at this time. Will continue to monitor. Dion Saucier

## 2012-02-13 NOTE — Progress Notes (Signed)
Physical Therapy Treatment Patient Details Name: [name] Luna MRN: [id number] DOB: Nov 29, 1920 Today's Date: 02/13/2012 Time: 4098-1191 PT Time Calculation (min): 39 min  PT Assessment / Plan / Recommendation Comments on Treatment Session  Pt appears delerious today and unable to tolerate vestibular testing and sitting EOB.  Modified Gilberto Better attempted with pt going into a postural extension that ruined the test.  Will attemp again as able.    Follow Up Recommendations  SNF     Does the patient have the potential to tolerate intense rehabilitation     Barriers to Discharge        Equipment Recommendations  None recommended by PT    Recommendations for Other Services    Frequency Min 3X/week   Plan Discharge plan needs to be updated;Frequency remains appropriate    Precautions / Restrictions Precautions Precautions: Fall   Pertinent Vitals/Pain     Mobility  Bed Mobility Bed Mobility: Rolling Right;Rolling Left;Right Sidelying to Sit;Sit to Sidelying Right Rolling Right: 3: Mod assist Rolling Left: 3: Mod assist Right Sidelying to Sit: 1: +2 Total assist;HOB flat Right Sidelying to Sit: Patient Percentage: 10% (pt resistant and posturing in extension with fear of falling) Sit to Sidelying Right: 1: +2 Total assist;HOB flat Sit to Sidelying Right: Patient Percentage: 0% Details for Bed Mobility Assistance: pt movement quality characterized by tremor-like cocontraction suspected due to vertigo, but unable to test.  Pt extended on sitting up and resisted all attempts to get her sitting upright at EOB Transfers Transfers: Not assessed Ambulation/Gait Ambulation/Gait Assistance: Not tested (comment) Stairs: No    Exercises     PT Diagnosis:    PT Problem List:   PT Treatment Interventions:     PT Goals Acute Rehab PT Goals Time For Goal Achievement: 02/26/12 Potential to Achieve Goals: Fair PT Goal: Supine/Side to Sit - Progress: Not met PT Goal: Sit to  Supine/Side - Progress: Not met Additional Goals Additional Goal #1: pt will be able to tolerate testing for BPPV to rule it in or out. PT Goal: Additional Goal #1 - Progress: Goal set today  Visit Information  Last PT Received On: 02/13/12 Assistance Needed: +2    Subjective Data  Subjective: I alright.Marland KitchenMarland KitchenI'm dizzy.[name]. (pt less appropriately verbal today   Cognition  Overall Cognitive Status: Impaired Area of Impairment: Attention;Following commands;Safety/judgement;Awareness of errors;Problem solving;Executive functioning Arousal/Alertness: Awake/alert Orientation Level: Disoriented X4 Behavior During Session: Anxious Current Attention Level: Focused Following Commands: Follows one step commands inconsistently    Balance  Static Sitting Balance Static Sitting - Balance Support: Feet supported;Bilateral upper extremity supported Static Sitting - Level of Assistance: 1: +2 Total assist;Patient percentage (comment) (pt resistant but helped no more than 10%)  End of Session PT - End of Session Activity Tolerance: Other (comment) (pt unable to tolerate sitting and vestibular testing ) Patient left: in bed;with call bell/phone within reach;with family/visitor present Nurse Communication: Other (comment) (status of vestibular testing)   GP     Adeoluwa Silvers, Eliseo Gum 02/13/2012, 4:49 PM  02/13/2012  [place] Bing, PT [phone] (pager)

## 2012-02-13 NOTE — Progress Notes (Addendum)
Pt. Seen and examined. Agree with the NP/PA-C note as written.  Appreciate neurology assistance, continues to be confused, ?progressive dementia with autonomic features. Will need SNF placement at this point she is too much for her son to handle at home.  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  Chrystie Nose, MD, [facility] Attending Cardiologist [facility]

## 2012-02-13 NOTE — Progress Notes (Signed)
CSW received referral SNF patient was not oriented. CSW left message with family in regards to SNF. CSW to continue to follow.  Morton Stall BSW intern

## 2012-02-14 LAB — BASIC METABOLIC PANEL
Calcium: 8.8 mg/dL (ref 8.4–10.5)
Creatinine, Ser: 0.78 mg/dL (ref 0.50–1.10)
GFR calc non Af Amer: 72 mL/min — ABNORMAL LOW (ref >90)
Sodium: 144 mEq/L (ref 135–145)

## 2012-02-14 MED ORDER — ENSURE COMPLETE PO LIQD
237.0000 mL | Freq: Two times a day (BID) | ORAL | Status: DC
  Administered 2012-02-14 – 2012-02-15 (×2): 237 mL via ORAL

## 2012-02-14 NOTE — Progress Notes (Signed)
Pt is no longer appropriate for our services. Will sign off. Ethelda Chick CES, ACSM

## 2012-02-14 NOTE — Progress Notes (Signed)
Physical Therapy Treatment Patient Details Name: [name] Luna MRN: [id number] DOB: 05-Feb-1921 Today's Date: 02/14/2012 Time: 2952-8413 PT Time Calculation (min): 70 min  PT Assessment / Plan / Recommendation Comments on Treatment Session  Pt is much improved over 02/13/12,  She was able to handle vestibular testing, specifically for BPPV.  Dix- Hallpike was unexpectedly negative R.  Did not get to L.  Supine head Roll was positive bil. with bilaterally geotrophic nystagmus, but with more intense nystagmus to the L.  Treated pt with the "barbeque roll",  Gave pt's son handouts of what we saw and did to treat and gave them another self treatment for the horizontal BPPV.  Will return to reassess for further needs 11/15/    Follow Up Recommendations  SNF     Does the patient have the potential to tolerate intense rehabilitation     Barriers to Discharge        Equipment Recommendations  None recommended by PT    Recommendations for Other Services    Frequency Min 3X/week   Plan Discharge plan remains appropriate    Precautions / Restrictions Precautions Precautions: Fall   Pertinent Vitals/Pain     Mobility  Bed Mobility Bed Mobility: Rolling Right;Rolling Left;Right Sidelying to Sit;Left Sidelying to Sit;Sit to Sidelying Right;Sit to Sidelying Left Rolling Right: 4: Min guard Rolling Left: 4: Min guard Right Sidelying to Sit: 4: Min assist;3: Mod assist Left Sidelying to Sit: 4: Min assist Sit to Sidelying Right: 3: Mod assist Details for Bed Mobility Assistance: vc's for direction, technique; truncal stability and assist Transfers Transfers: Sit to Stand;Stand to Sit;Stand Pivot Transfers Sit to Stand: 4: Min assist;3: Mod assist;Other (comment);With upper extremity assist;From bed (variability based on how strong the vertigo hit her) Stand to Sit: 3: Mod assist;4: Min assist;With upper extremity assist;To bed;To chair/3-in-1 Stand Pivot Transfers: 4: Min assist Details  for Transfer Assistance: vc/tc's for hand placement, direction; truncal assist and support, variable based on s/s of vertigo Ambulation/Gait Ambulation/Gait Assistance: 1: +2 Total assist Ambulation/Gait: Patient Percentage: 70% Ambulation Distance (Feet): 85 Feet Assistive device: Rolling walker Ambulation/Gait Assistance Details: unsteady gait with unequal step length that improved with distance and incr confidence; min a to maneiuver  RW, postural checks and stability Gait Pattern: Step-through pattern;Decreased step length - right;Decreased step length - left;Shuffle;Trunk flexed;Decreased stance time - left;Decreased stride length Gait velocity: decreased Stairs: No Wheelchair Mobility Wheelchair Mobility: No    Exercises     PT Diagnosis:    PT Problem List:   PT Treatment Interventions:     PT Goals Acute Rehab PT Goals Time For Goal Achievement: 02/26/12 Potential to Achieve Goals: Fair PT Goal: Supine/Side to Sit - Progress: Progressing toward goal PT Goal: Sit to Supine/Side - Progress: Progressing toward goal PT Goal: Sit to Stand - Progress: Progressing toward goal PT Goal: Stand to Sit - Progress: Progressing toward goal PT Goal: Ambulate - Progress: Progressing toward goal Additional Goals PT Goal: Additional Goal #1 - Progress: Progressing toward goal  Visit Information  Last PT Received On: 02/14/12 Assistance Needed: +2    Subjective Data  Subjective: I didn't have a good night.Marland KitchenMarland KitchenI dizzy now   Cognition  Overall Cognitive Status: Impaired Area of Impairment: Attention;Following commands;Safety/judgement;Awareness of errors;Problem solving;Executive functioning Arousal/Alertness: Awake/alert Orientation Level: Other (comment) (still confused, but more oriented) Behavior During Session: Anxious Current Attention Level: Sustained Following Commands: Follows one step commands inconsistently    Balance  Balance Balance Assessed: Yes Static Sitting  Balance  Static Sitting - Balance Support: Feet supported;Bilateral upper extremity supported Static Sitting - Level of Assistance: 2: Max assist;4: Min assist;Other (comment) (variable due to vertigo) Static Sitting - Comment/# of Minutes: 5  End of Session PT - End of Session Activity Tolerance: Patient tolerated treatment well Patient left: in chair;with call bell/phone within reach;with family/visitor present Nurse Communication: Mobility status   GP     Maksym Pfiffner, Eliseo Gum 02/14/2012, 1:17 PM  02/14/2012  [place] Bing, PT [phone] (pager)

## 2012-02-14 NOTE — Progress Notes (Signed)
[facility]  Subjective: Dizzy.  According to her son, she is more alert and bright today.    Objective: Vital signs in last 24 hours: Temp:  [97.7 F (36.5 C)-98 F (36.7 C)] 97.7 F (36.5 C) (11/14 0415) Pulse Rate:  [72-96] 81  (11/14 0415) Resp:  [18-23] 23  (11/14 0415) BP: (150-196)/(55-81) 161/62 mmHg (11/14 0415) SpO2:  [95 %-96 %] 96 % (11/14 0415) Weight:  [69 kg (152 lb 1.9 oz)] 69 kg (152 lb 1.9 oz) (11/13 1300) Last BM Date: 02/10/12  Intake/Output from previous day: 11/13 0701 - 11/14 0700 In: 240 [P.O.:240] Out: -  Intake/Output this shift:    Medications Current Facility-Administered Medications  Medication Dose Route Frequency Provider Last Rate Last Dose  . 0.9 %  sodium chloride infusion  250 mL Intravenous PRN Meredeth Ide, MD      . acetaminophen (TYLENOL) tablet 650 mg  650 mg Oral Q4H PRN Abelino Derrick, PA      . amiodarone (PACERONE) tablet 200 mg  200 mg Oral Daily Meredeth Ide, MD   200 mg at 02/13/12 1054  . aspirin chewable tablet 81 mg  81 mg Oral Daily Meredeth Ide, MD   81 mg at 02/13/12 1055  . calcium carbonate (TUMS - dosed in mg elemental calcium) chewable tablet 200 mg of elemental calcium  1 tablet Oral Daily Meredeth Ide, MD   200 mg of elemental calcium at 02/13/12 1054  . clopidogrel (PLAVIX) tablet 75 mg  75 mg Oral Daily Meredeth Ide, MD   75 mg at 02/13/12 1054  . diphenhydrAMINE (BENADRYL) capsule 25 mg  25 mg Oral QHS PRN Abelino Derrick, PA   25 mg at 02/13/12 2159  . haloperidol lactate (HALDOL) injection 0.5 mg  0.5 mg Intravenous Q6H PRN Abelino Derrick, PA      . haloperidol lactate (HALDOL) injection 1 mg  1 mg Intravenous Once Abelino Derrick, [place]      . levothyroxine (SYNTHROID, LEVOTHROID) tablet 75 mcg  75 mcg Oral Daily Meredeth Ide, MD   75 mcg at 02/13/12 1055  . loratadine (CLARITIN) tablet 10 mg  10 mg Oral Daily Meredeth Ide, MD   10 mg at 02/13/12 1054  . losartan (COZAAR) tablet 25 mg  25 mg  Oral Daily Nada Boozer, NP   25 mg at 02/13/12 1054  . losartan (COZAAR) tablet 50 mg  50 mg Oral QHS Nada Boozer, NP   50 mg at 02/13/12 2159  . meclizine (ANTIVERT) tablet 25 mg  25 mg Oral BID Wilburt Finlay, PA   25 mg at 02/13/12 2159  . metoprolol (LOPRESSOR) tablet 50 mg  50 mg Oral QHS Nada Boozer, NP   50 mg at 02/13/12 2159  . metoprolol tartrate (LOPRESSOR) tablet 25 mg  25 mg Oral Daily Nada Boozer, NP   25 mg at 02/13/12 1054  . midodrine (PROAMATINE) tablet 2.5 mg  2.5 mg Oral QAC breakfast Nada Boozer, NP   2.5 mg at 02/14/12 0536  . nitrofurantoin (macrocrystal-monohydrate) (MACROBID) capsule 100 mg  100 mg Oral Daily Meredeth Ide, MD   100 mg at 02/13/12 1054  . ondansetron (ZOFRAN) injection 4 mg  4 mg Intravenous Q6H PRN Nada Boozer, NP   4 mg at 02/09/12 0141  . pantoprazole (PROTONIX) EC tablet 40 mg  40 mg Oral BID Meredeth Ide, MD   40 mg at 02/13/12 2200  .  polyethylene glycol (MIRALAX / GLYCOLAX) packet 17 g  17 g Oral Daily PRN Meredeth Ide, MD   17 g at 02/11/12 1317  . ranolazine (RANEXA) 12 hr tablet 500 mg  500 mg Oral BID Meredeth Ide, MD   500 mg at 02/13/12 2200  . simvastatin (ZOCOR) tablet 20 mg  20 mg Oral QPM Meredeth Ide, MD   20 mg at 02/13/12 1610  . sodium chloride 0.9 % injection 3 mL  3 mL Intravenous Q12H Meredeth Ide, MD   3 mL at 02/13/12 2200  . sodium chloride 0.9 % injection 3 mL  3 mL Intravenous PRN Meredeth Ide, MD      . traMADol Janean Sark) tablet 25 mg  25 mg Oral QHS Meredeth Ide, MD   25 mg at 02/13/12 2159    PE: General appearance: alert, cooperative and no distress Lungs: clear to auscultation bilaterally Heart: regular rate and rhythm and 2/6 sys MM Extremities: No LEE Pulses: 2+ and symmetric Neurologic: Grossly normal  Lab Results:  No results found for this basename: WBC:3,HGB:3,HCT:3,PLT:3 in the last 72 hours BMET  Basename 02/14/12 0430  NA 144  K 3.4*  CL 107  CO2 28  GLUCOSE 85  BUN 15  CREATININE 0.78  CALCIUM  8.8    Assessment/Plan  Principal Problem:  *Syncope, secondary to orthostatic hypotension Active Problems:  HYPOTHYROIDISM  HYPERTENSION  CORONARY ARTERY DISEASE  NAUSEA  H/O cardiac pacemaker, Medtronic versa implanted 01/2008, though original implanted 2000 for syncope and heart block and ventricular asystole.  Paroxysmal a-fib, history of, off coumadin since 10/2010, without recurrence of afib.  Diastolic dysfunction, grade two  Hypotension  Plan:  CSW working on SNF placement.  BP appears more stable, but elevated.   Neurology suggested florinef.  PT is working with her now.  They have not been able to do any vestibular testing.     LOS: 8 days    HAGER, BRYAN 02/14/2012 11:36 AM   Agree with note written by [facility]  Awaiting SNF placement  [facility] 02/14/2012 5:36 PM

## 2012-02-14 NOTE — Progress Notes (Signed)
Clinical Social Work Department BRIEF PSYCHOSOCIAL ASSESSMENT 02/14/2012  Patient:  [name] Luna, [name] Luna     Account Number:  [id number]     Admit date:  02/06/2012  Clinical Social Worker:  Dennison Bulla  Date/Time:  02/14/2012 10:00 AM  Referred by:  Physician  Date Referred:  02/14/2012 Referred for  SNF Placement   Other Referral:   Interview type:  Family Other interview type:    PSYCHOSOCIAL DATA Living Status:  FAMILY Admitted from facility:   Level of care:   Primary support name:  Peyton Najjar Primary support relationship to patient:  CHILD, ADULT Degree of support available:   Strong    CURRENT CONCERNS Current Concerns  Post-Acute Placement   Other Concerns:    SOCIAL WORK ASSESSMENT / PLAN CSW received referral to assist with SNF placement. CSW reviewed chart and met with patient and son at bedside. Patient was confused and trying to sleep so CSW met with son outside of room.    CSW introduced myself and explained role. Patient's husband passed away in 2007/09/12 and son moved into house at that point to help care for patient. Patient was independent and ambulating independently up until a few months ago. Son reports that patient has had several admissions this year and was recently hospitalized this month. Son recently retired and has been at home with patient but is agreeable to SNF. Patient went to SNF about 3 years ago and stayed at [facility] for about a month. Son reports this was a positive experience and would like SNF again for patient. CSW provided son with SNF list and explained process. CSW explained Medicare benefits in regards to SNF. Son agreeable to search in [place].    CSW completed FL2 and faxed out. CSW will follow up with bed offers.   Assessment/plan status:  Psychosocial Support/Ongoing Assessment of Needs Other assessment/ plan:   Information/referral to community resources:   SNF list    PATIENT'S/FAMILY'S RESPONSE TO PLAN OF  CARE: Patient unable to participate in assessment. Son engaged throughout assessment and agreeable to CSW consult. Son agreeable to CSW follow up with bed offers.        Coverage for Frederico Hamman

## 2012-02-14 NOTE — Progress Notes (Signed)
INITIAL ADULT NUTRITION ASSESSMENT Date: 02/14/2012   Time: 2:03 PM  INTERVENTION:  Ensure Complete supplement twice daily between meals (350 kcals, 13 gm protein per 8 fl oz bottle) RD to follow for nutrition care plan  DOCUMENTATION CODES Per approved criteria  -Not Applicable   Reason for Assessment: Malnutrition Screening Tool Report  ASSESSMENT: Female 78 y.o.  Dx: Syncope  Hx:  Past Medical History  Diagnosis Date  . CHF (congestive heart failure)   . A-fib   . Hypothyroid   . GERD (gastroesophageal reflux disease)   . Hypothyroidism   . Bradycardia   . Pancreatitis   . Coronary artery disease   . Shingles 1980's    "twice; once on my front side; once on my back" (02/07/2012)  . CORONARY ARTERY DISEASE 11/28/2006    Qualifier: Diagnosis of  By: Tawanna Cooler MD, Eugenio Hoes   . Pacemaker   . Paroxysmal a-fib, history of 08/02/2011  . Tremor, essential 08/02/2011  . Esophageal disorder, with history of dilatation 08/02/2011  . Angina   . H/O hiatal hernia   . Neuromuscular disorder     essential tremors  . UTI (lower urinary tract infection) 08/03/2011  . Dizziness, near syncope 11/07/2011  . Macular degeneration, dry     "both eyes" (02/07/2012)  . Hypertension   . HTN (hypertension), accelerated this admit 11/08/2011  . Pneumonia 1927    Related Meds:     . amiodarone  200 mg Oral Daily  . aspirin  81 mg Oral Daily  . calcium carbonate  1 tablet Oral Daily  . clopidogrel  75 mg Oral Daily  . haloperidol lactate  1 mg Intravenous Once  . levothyroxine  75 mcg Oral Daily  . loratadine  10 mg Oral Daily  . losartan  25 mg Oral Daily  . losartan  50 mg Oral QHS  . meclizine  25 mg Oral BID  . metoprolol tartrate  50 mg Oral QHS  . metoprolol  25 mg Oral Daily  . midodrine  2.5 mg Oral QAC breakfast  . nitrofurantoin (macrocrystal-monohydrate)  100 mg Oral Daily  . pantoprazole  40 mg Oral BID  . ranolazine  500 mg Oral BID  . simvastatin  20 mg Oral QPM  . sodium  chloride  3 mL Intravenous Q12H  . traMADol  25 mg Oral QHS    Ht: 5\' 4"  (162.6 cm)  Wt: 152 lb 1.9 oz (69 kg)   Wt Readings from Last 5 Encounters:  02/13/12 152 lb 1.9 oz (69 kg)  01/31/12 151 lb (68.493 kg)  01/26/12 145 lb 12.8 oz (66.134 kg)  11/08/11 147 lb 7.8 oz (66.9 kg)  08/02/11 146 lb 2.6 oz (66.3 kg)    Ideal Wt: 54.5 kg % Ideal Wt: 126%  Usual Wt: 151 lb -- per office visit record 01/31/12 % Usual Wt: 101%  Body mass index is 26.11 kg/(m^2).  Food/Nutrition Related Hx: recent weight lost without trying (patient unsure) and decreased appetite per admission nutrition screen  Labs:  CMP     Component Value Date/Time   NA 144 02/14/2012 0430   K 3.4* 02/14/2012 0430   CL 107 02/14/2012 0430   CO2 28 02/14/2012 0430   GLUCOSE 85 02/14/2012 0430   BUN 15 02/14/2012 0430   CREATININE 0.78 02/14/2012 0430   CALCIUM 8.8 02/14/2012 0430   PROT 6.1 02/11/2012 0515   ALBUMIN 3.0* 02/11/2012 0515   AST 21 02/11/2012 0515   ALT 14 02/11/2012 0515  ALKPHOS 150* 02/11/2012 0515   BILITOT 0.5 02/11/2012 0515   GFRNONAA 72* 02/14/2012 0430   GFRAA 83* 02/14/2012 0430    Diet Order: Cardiac  Supplements/Tube Feeding: N/A   IVF: N/A  Estimated Nutritional Needs:   Kcal: 1700-1900 Protein: 80-90 gm Fluid: 1.7-1.9 L  Patient admitted after having passing out episode; working with PT upon RD visitation; PO intake at 20-50% per flowsheet records; no skin breakdown noted; weight stable per records; would benefit from addition of nutrition supplements -- RD to order.  NUTRITION DIAGNOSIS: -Inadequate oral intake (NI-2.1).  Status: Ongoing  RELATED TO: poor appetite, advanced age  AS EVIDENCE BY: PO intake 20-50%  MONITORING/EVALUATION(Goals): Goal: Oral intake with meals & supplements to meet >/= 90% of estimated nutrition needs Monitor: PO & supplemental intake, weight, labs, I/O's  EDUCATION NEEDS: -No education needs identified at this  time  Kirkland Hun, RD, LDN Pager #: [phone] After-Hours Pager #: [phone]

## 2012-02-14 NOTE — Progress Notes (Signed)
Clinical Social Work Department CLINICAL SOCIAL WORK PLACEMENT NOTE 02/14/2012  Patient:  YARELIE, BURKY  Account Number:  [id number] Admit date:  02/06/2012  Clinical Social Worker:  Unk Lightning, LCSW  Date/time:  02/14/2012 10:00 AM  Clinical Social Work is seeking post-discharge placement for this patient at the following level of care:   SKILLED NURSING   (*CSW will update this form in Epic as items are completed)   02/14/2012  Patient/family provided with [facility] Department of Clinical Social Work's list of facilities offering this level of care within the geographic area requested by the patient (or if unable, by the patient's family).  02/14/2012  Patient/family informed of their freedom to choose among providers that offer the needed level of care, that participate in Medicare, Medicaid or managed care program needed by the patient, have an available bed and are willing to accept the patient.  02/14/2012  Patient/family informed of [facility]' ownership interest in [facility], as well as of the fact that they are under no obligation to receive care at this facility.  PASARR submitted to EDS on existing # PASARR number received from EDS on   FL2 transmitted to all facilities in geographic area requested by pt/family on  02/14/2012 FL2 transmitted to all facilities within larger geographic area on   Patient informed that his/her managed care company has contracts with or will negotiate with  certain facilities, including the following:     Patient/family informed of bed offers received:   Patient chooses bed at  Physician recommends and patient chooses bed at    Patient to be transferred to  on   Patient to be transferred to facility by   The following physician request were entered in Epic:   Additional Comments:

## 2012-02-15 MED ORDER — TRAMADOL HCL 50 MG PO TABS: 25.0000 mg | ORAL_TABLET | Freq: Every day | ORAL | Status: DC

## 2012-02-15 MED ORDER — POTASSIUM CHLORIDE 20 MEQ/15ML (10%) PO LIQD
40.0000 meq | Freq: Once | ORAL | Status: AC
  Administered 2012-02-15: 40 meq via ORAL
  Filled 2012-02-15: qty 30

## 2012-02-15 MED ORDER — METOPROLOL TARTRATE 50 MG PO TABS: 50.0000 mg | ORAL_TABLET | Freq: Every day | ORAL | Status: DC

## 2012-02-15 MED ORDER — LOSARTAN POTASSIUM 50 MG PO TABS: 50.0000 mg | ORAL_TABLET | Freq: Two times a day (BID) | ORAL | Status: DC

## 2012-02-15 MED ORDER — POTASSIUM CHLORIDE CRYS ER 20 MEQ PO TBCR: 40.0000 meq | EXTENDED_RELEASE_TABLET | Freq: Once | ORAL | Status: DC

## 2012-02-15 MED ORDER — MIDODRINE HCL 2.5 MG PO TABS: 2.5000 mg | ORAL_TABLET | Freq: Every day | ORAL | Status: DC

## 2012-02-15 NOTE — Discharge Summary (Signed)
Physician Discharge Summary  Patient ID: [name] Luna MRN: [id number] DOB/AGE: [id number] 76 y.o.  Admit date: 02/06/2012 Discharge date: 02/15/2012  Admission Diagnoses:  Syncope  Discharge Diagnoses:  Principal Problem:  *Syncope, secondary to orthostatic hypotension Active Problems:  HYPOTHYROIDISM  HYPERTENSION  CORONARY ARTERY DISEASE  NAUSEA  H/O cardiac pacemaker, Medtronic versa implanted 01/2008, though original implanted 2000 for syncope and heart block and ventricular asystole.  Paroxysmal a-fib, history of, off coumadin since 10/2010, without recurrence of afib.  Diastolic dysfunction, grade two  Hypotension  Discharged Condition: stable  Hospital Course:   78 year old female with past medical history of hypertension, coronary artery disease, hypertension, heart failure, hypothyroidism, PAF.  Status post pacemaker. Hx of CAD With prior PCI with cutting balloon atherectomy and DES stenting of the LAD in 2008. In October 2011 she had 75% narrowing in her LAD stent and was restented with another drug-eluting Cypher stent. In June 2012 she was readmitted with chest pain recathed at that time which revealed 70% in-stent restenosis in the LAD stent and was treated with a DES resolute stent. September 2012 she was readmitted for chest pain cardiac cath at that time revealed patent stents. In May of this year she was admitted with chest pain negative EKG negative enzymes and she was set up for an outpatient Lexiscan Myoview which was negative for ischemia EF was normal and was felt to be a low risk study. She was on Coumadin aspirin and Plavix but the Coumadin has been DC'd. She also has a permanent pacemaker; a Medtronic Versa implanted in 2009 which was end-of-life change her initial pacemaker was 2000, implanted for syncope and heart block and ventricular asystole. She was discharged recently on midodrine.   She presented with presyncope and hypotension. Her son stated that during  breakfast she appeared as though she "zoned out" and her head leaned forward. He said she did not pass out. SBP was . Patient's son reports that 2 days ago patient had been urinating quite frequently during the night and as well several nights prior to that. She had been started on nitrofurantoin by Dr. Tawanna Cooler. The first dose was this past Saturday. Patient reports dizziness, nausea during the episode. She denies vomiting fever, chest pain, shortness of breath, abdominal pain, palpitations, orthopnea, PND, dysuria, hematuria.  She was admitted as an inpatient.  Several adjustments were made to her BP medications, including shifting increased doses to the evening when BP is higher.  Neurology consult was requested and they suggested trying Florinef.  PT evaluation.  The patient was seen by Dr. Herbie Baltimore who feels she is stable for DC to skilled nursing facility.  She may well benefit from PM/qhs antipsychotic med (? Seroquel) for SNF.   Would consider prn PO Hydralazine 25mg  q12 hr for BP > .  Consults: neurology  Significant Diagnostic Studies:  BMET    Component Value Date/Time   NA 144 02/14/2012 0430   K 3.4* 02/14/2012 0430   CL 107 02/14/2012 0430   CO2 28 02/14/2012 0430   GLUCOSE 85 02/14/2012 0430   BUN 15 02/14/2012 0430   CREATININE 0.78 02/14/2012 0430   CALCIUM 8.8 02/14/2012 0430   GFRNONAA 72* 02/14/2012 0430   GFRAA 83* 02/14/2012 0430   CBC    Component Value Date/Time   WBC 5.6 02/07/2012 0555   RBC 3.69* 02/07/2012 0555   HGB 11.1* 02/07/2012 0555   HCT 33.9* 02/07/2012 0555   PLT 210 02/07/2012 0555   MCV 91.9 02/07/2012 0555  MCH 30.1 02/07/2012 0555   MCHC 32.7 02/07/2012 0555   RDW 15.6* 02/07/2012 0555   LYMPHSABS 0.9 02/06/2012 1235   MONOABS 0.5 02/06/2012 1235   EOSABS 0.1 02/06/2012 1235   BASOSABS 0.0 02/06/2012 1235   Treatments: See Med list below.  Discharge Exam: Blood pressure 162/72, pulse 76, temperature 97.4 F (36.3 C), temperature source  Oral, resp. rate 19, height 5\' 4"  (1.626 m), weight 69 kg (152 lb 1.9 oz), SpO2 97.00%.   Disposition: 06-Home-Health Care Svc  Discharge Orders    Future Appointments: Provider: Department: Dept Phone: Center:   03/03/2012 10:15 AM Roderick Pee, MD [facility] [phone] [facility]     Future Orders Please Complete By Expires   Diet - low sodium heart healthy      Increase activity slowly          Medication List     As of 02/15/2012 11:12 AM    TAKE these medications         amiodarone 200 MG tablet   Commonly known as: PACERONE   Take 200 mg by mouth daily.      aspirin 81 MG EC tablet   Take 81 mg by mouth daily.      calcium carbonate 500 MG chewable tablet   Commonly known as: TUMS - dosed in mg elemental calcium   Chew 1 tablet by mouth daily.      clopidogrel 75 MG tablet   Commonly known as: PLAVIX   Take 75 mg by mouth daily.      levothyroxine 75 MCG tablet   Commonly known as: SYNTHROID, LEVOTHROID   Take 75 mcg by mouth daily.      loratadine 10 MG tablet   Commonly known as: CLARITIN   Take 10 mg by mouth daily.      losartan 50 MG tablet   Commonly known as: COZAAR   Take 1 tablet (50 mg total) by mouth 2 (two) times daily.      meclizine 25 MG tablet   Commonly known as: ANTIVERT   Take 25 mg by mouth 2 (two) times daily as needed. For dizziness      metoprolol 50 MG tablet   Commonly known as: LOPRESSOR   Take 1 tablet (50 mg total) by mouth at bedtime.      midodrine 2.5 MG tablet   Commonly known as: PROAMATINE   Take 1 tablet (2.5 mg total) by mouth daily before breakfast.      multivitamin capsule   Take 1 capsule by mouth daily.      nitrofurantoin (macrocrystal-monohydrate) 100 MG capsule   Commonly known as: MACROBID   Take 100 mg by mouth daily. For 6 weeks  Started 02/02/2012      nitroGLYCERIN 0.4 MG SL tablet   Commonly known as: NITROSTAT   Place 0.4 mg under the tongue every 5 (five) minutes as  needed. For chest pain      pantoprazole 40 MG tablet   Commonly known as: PROTONIX   Take 40 mg by mouth 2 (two) times daily.      polyethylene glycol packet   Commonly known as: MIRALAX / GLYCOLAX   Take 17 g by mouth daily as needed. For constipation      PreserVision/Lutein Caps   Take by mouth 2 (two) times daily.      ranolazine 500 MG 12 hr tablet   Commonly known as: RANEXA   Take 500 mg by mouth 2 (  two) times daily.      simvastatin 20 MG tablet   Commonly known as: ZOCOR   Take 20 mg by mouth every evening.      traMADol 50 MG tablet   Commonly known as: ULTRAM   Take 25 mg by mouth at bedtime.         SignedWilburt Finlay 02/15/2012, 11:12 AM   I saw and evaluated the patient this morning along with Wilburt Finlay, PA. I agree with summary above. She actually looks good today - less shaky. Son says from [place] AM on, she has been doing well. ? Not sure if this coincided with Haloperidol administration -- if so, she may well benefit from PM/qhs antipsychotic med (? Seroquel) for SNF. Would defer this to PMD / SNF MD.  I think we are about as stable as we have been. I have signed the FL2.  No further CP on Ranexa despite BP spikes to >180. She continue to have labile BPs, but I not as great swings as before.  Continue with current BP med regimen including AM Midodrine & bid Meclizine. Would consider prn PO Hydralazine 25mg  q12 hr for BP > .  CSW working on SNF placement -- I did spend time discussing this with the patient & son. This is truly the best option for her safety & his piece of mind. The stress of caring for her may become too much & lead to resentment. She should be ok for d/c when SNF bed is available provided no adverse events occur.  Marykay Lex, M.D., M.S. [facility] [address] [place], [place]  [address]  [phone] Pager # [phone] 02/15/2012 1:16 PM

## 2012-02-15 NOTE — Progress Notes (Signed)
[facility]  Subjective: A little dizzy at times.  Objective: Vital signs in last 24 hours: Temp:  [97.3 F (36.3 C)-98 F (36.7 C)] 97.4 F (36.3 C) (11/15 0642) Pulse Rate:  [69-78] 76  (11/15 0642) Resp:  [18-19] 19  (11/15 0642) BP: (104-162)/(44-72) 162/72 mmHg (11/15 0642) SpO2:  [97 %-98 %] 97 % (11/15 0642) Last BM Date: 02/14/12  Intake/Output from previous day: 11/14 0701 - 11/15 0700 In: 3 [I.V.:3] Out: -  Intake/Output this shift:    Medications Current Facility-Administered Medications  Medication Dose Route Frequency Provider Last Rate Last Dose  . 0.9 %  sodium chloride infusion  250 mL Intravenous PRN Meredeth Ide, MD      . acetaminophen (TYLENOL) tablet 650 mg  650 mg Oral Q4H PRN Abelino Derrick, PA      . amiodarone (PACERONE) tablet 200 mg  200 mg Oral Daily Meredeth Ide, MD   200 mg at 02/14/12 1212  . aspirin chewable tablet 81 mg  81 mg Oral Daily Meredeth Ide, MD   81 mg at 02/14/12 1213  . calcium carbonate (TUMS - dosed in mg elemental calcium) chewable tablet 200 mg of elemental calcium  1 tablet Oral Daily Meredeth Ide, MD   200 mg of elemental calcium at 02/14/12 1212  . clopidogrel (PLAVIX) tablet 75 mg  75 mg Oral Daily Meredeth Ide, MD   75 mg at 02/14/12 1212  . diphenhydrAMINE (BENADRYL) capsule 25 mg  25 mg Oral QHS PRN Abelino Derrick, PA   25 mg at 02/13/12 2159  . feeding supplement (ENSURE COMPLETE) liquid 237 mL  237 mL Oral BID BM Ailene Ards, RD   237 mL at 02/14/12 1500  . haloperidol lactate (HALDOL) injection 0.5 mg  0.5 mg Intravenous Q6H PRN Abelino Derrick, PA      . haloperidol lactate (HALDOL) injection 1 mg  1 mg Intravenous Once Abelino Derrick, [place]      . levothyroxine (SYNTHROID, LEVOTHROID) tablet 75 mcg  75 mcg Oral Daily Meredeth Ide, MD   75 mcg at 02/14/12 1212  . loratadine (CLARITIN) tablet 10 mg  10 mg Oral Daily Meredeth Ide, MD   10 mg at 02/14/12 1212  . losartan (COZAAR) tablet  25 mg  25 mg Oral Daily Nada Boozer, NP   25 mg at 02/14/12 1212  . losartan (COZAAR) tablet 50 mg  50 mg Oral QHS Nada Boozer, NP   50 mg at 02/14/12 2216  . meclizine (ANTIVERT) tablet 25 mg  25 mg Oral BID Wilburt Finlay, PA   25 mg at 02/14/12 2216  . metoprolol (LOPRESSOR) tablet 50 mg  50 mg Oral QHS Nada Boozer, NP   50 mg at 02/14/12 2215  . metoprolol tartrate (LOPRESSOR) tablet 25 mg  25 mg Oral Daily Nada Boozer, NP   25 mg at 02/14/12 1213  . midodrine (PROAMATINE) tablet 2.5 mg  2.5 mg Oral QAC breakfast Nada Boozer, NP   2.5 mg at 02/15/12 0640  . nitrofurantoin (macrocrystal-monohydrate) (MACROBID) capsule 100 mg  100 mg Oral Daily Meredeth Ide, MD   100 mg at 02/14/12 1212  . ondansetron (ZOFRAN) injection 4 mg  4 mg Intravenous Q6H PRN Nada Boozer, NP   4 mg at 02/09/12 0141  . pantoprazole (PROTONIX) EC tablet 40 mg  40 mg Oral BID Meredeth Ide, MD   40 mg at 02/14/12 2216  .  polyethylene glycol (MIRALAX / GLYCOLAX) packet 17 g  17 g Oral Daily PRN Meredeth Ide, MD   17 g at 02/11/12 1317  . ranolazine (RANEXA) 12 hr tablet 500 mg  500 mg Oral BID Meredeth Ide, MD   500 mg at 02/14/12 2215  . simvastatin (ZOCOR) tablet 20 mg  20 mg Oral QPM Meredeth Ide, MD   20 mg at 02/14/12 2215  . sodium chloride 0.9 % injection 3 mL  3 mL Intravenous Q12H Meredeth Ide, MD   3 mL at 02/14/12 2219  . sodium chloride 0.9 % injection 3 mL  3 mL Intravenous PRN Meredeth Ide, MD      . traMADol Janean Sark) tablet 25 mg  25 mg Oral QHS Meredeth Ide, MD   25 mg at 02/14/12 2218    PE: General appearance: alert, cooperative and no distress Lungs: clear to auscultation bilaterally Heart: regular rate and rhythm and 1/6 sys MM Extremities: No LEE Pulses: 2+ and symmetric Neurologic: Grossly normal  Lab Results:  No results found for this basename: WBC:3,HGB:3,HCT:3,PLT:3 in the last 72 hours BMET  Basename 02/14/12 0430  NA 144  K 3.4*  CL 107  CO2 28  GLUCOSE 85  BUN 15  CREATININE  0.78  CALCIUM 8.8   Assessment/Plan  Principal Problem:  *Syncope, secondary to orthostatic hypotension Active Problems:  HYPOTHYROIDISM  HYPERTENSION  CORONARY ARTERY DISEASE  NAUSEA  H/O cardiac pacemaker, Medtronic versa implanted 01/2008, though original implanted 2000 for syncope and heart block and ventricular asystole.  Paroxysmal a-fib, history of, off coumadin since 10/2010, without recurrence of afib.  Diastolic dysfunction, grade two  Hypotension  Plan:  BP looks the best that I have seen. Range 104/47-162/72.   Awaiting SNF placement.   MD:  Please sign FL2.   LOS: 9 days    HAGER, BRYAN 02/15/2012 8:02 AM  I seen and evaluated the patient this morning along with Wilburt Finlay, PA. I agree with his findings, examination as well as impression recommendations.  Looks good today - less shaky.  Son says from [place] AM on, she has been doing well.  ? Not sure if this coincided with Haloperidol administration -- if so, she may well benefit from PM/qhs antipsychotic med (? Seroquel) for SNF.  Would defer this to PMD / SNF MD.    I think we are about as stable as we have been.  I have signed the FL2.  No further CP on Ranexa despite BP spikes to >180.  She continue to have labile BPs, but I not as great swings as before. Continue with current BP med regimen including AM Midodrine & bid Meclizine.  Would consider prn PO Hydralazine 25mg  q12 hr for BP > .  CSW working on SNF placement -- I did spend time discussing this with the patient & son.  This is truly the best option for her safety & his piece of mind.  The stress of caring for her may become too much & lead to resentment.  She should be ok for d/c when SNF bed is available provided no adverse events occur.   Marykay Lex, M.D., M.S. [facility] [address] [place], [place]  [address]  [phone] Pager # [phone] 02/15/2012 9:48 AM

## 2012-02-18 ENCOUNTER — Other Ambulatory Visit (HOSPITAL_COMMUNITY): Payer: Self-pay | Admitting: Internal Medicine

## 2012-02-21 ENCOUNTER — Ambulatory Visit (HOSPITAL_COMMUNITY)
Admission: RE | Admit: 2012-02-21 | Discharge: 2012-02-21 | Disposition: A | Discharge status: Home / Self Care | Payer: Medicare Other | Source: Ambulatory Visit | Attending: Internal Medicine | Admitting: Internal Medicine

## 2012-02-21 DIAGNOSIS — R1310 Dysphagia, unspecified: Secondary | ICD-10-CM

## 2012-02-21 DIAGNOSIS — R1311 Dysphagia, oral phase: Secondary | ICD-10-CM | POA: Insufficient documentation

## 2012-02-21 NOTE — Procedures (Signed)
Objective Swallowing Evaluation: Modified Barium Swallowing Study  Patient Details  Name: [name] Luna MRN: [id number] Date of Birth: 12-Sep-1920  Today's Date: 02/21/2012 Time: 1100-1125 SLP Time Calculation (min): 25 min  Past Medical History:  Past Medical History  Diagnosis Date  . CHF (congestive heart failure)   . A-fib   . Hypothyroid   . GERD (gastroesophageal reflux disease)   . Hypothyroidism   . Bradycardia   . Pancreatitis   . Coronary artery disease   . Shingles 1980's    "twice; once on my front side; once on my back" (02/07/2012)  . CORONARY ARTERY DISEASE 11/28/2006    Qualifier: Diagnosis of  By: Tawanna Cooler MD, Eugenio Hoes   . Pacemaker   . Paroxysmal a-fib, history of 08/02/2011  . Tremor, essential 08/02/2011  . Esophageal disorder, with history of dilatation 08/02/2011  . Angina   . H/O hiatal hernia   . Neuromuscular disorder     essential tremors  . UTI (lower urinary tract infection) 08/03/2011  . Dizziness, near syncope 11/07/2011  . Macular degeneration, dry     "both eyes" (02/07/2012)  . Hypertension   . HTN (hypertension), accelerated this admit 11/08/2011  . Pneumonia 1927   Past Surgical History:  Past Surgical History  Procedure Date  . Repair spigelian hernia   . S/p childbirth     x2  . Cataract extraction w/ intraocular lens  implant, bilateral ~ 1997  . Coronary angioplasty with stent placement 2008  . Vaginal hysterectomy 1970's  . Left oophorectomy 1970's  . Insert / replace / remove pacemaker 2000; 2009    initial placement; replaced  . Appendectomy 1933  . Cholecystectomy 1950's  . Hernia repair   . Esophageal dilation     "once" (02/07/2012)   HPI:  78 yr old seen for outpatient MBS accompanied by her son.  Pt.'s son reports mild difficulty taking pills at the SNF where she is receiving rehab.  PMH:  GERD, esophageal dilitation several years ago, MBS 05/19/10 with recommendation for Dys 2 nectar thick liquids, MBS 05/24/10 recommended Dys 3  and thin liquids.       Assessment / Plan / Recommendation Clinical Impression  Dysphagia Diagnosis: Mild oral phase dysphagia;Moderate oral phase dysphagia Clinical impression: Pt. demonstrated a mild-moderate motor based pharyngeal phase dysphagia.  Orally, pt experienced mild diffuclty manipulating and transiting pill in teaspoon applesuace to posterior oral cavity.   Pt's swallow initiation was timely, however laryngeal elevation and closure was decreased leading to silent laryngeal penetration to the vocal cords with cup sip thin barium.  A chin tuck technique consistently prevented entrance of barium into vestibule.  Pyriform sinus residue was intermittent throughout study and mild due to decreased laryngeal elevation which cleared with spontaneous second swallow.  Brief esophageal sweep did not reveal detectable deficits.  SLP recommends pt. continue a regular texture diet and thin liquids with chin tuck for all liquids, pills whole in puree.  Pt. may require full supervision until she is able to consistently demonstrate chin tuck.      Treatment Recommendation  Defer treatment plan to SLP at (Comment) (SNF)    Diet Recommendation Regular;Thin liquid   Liquid Administration via: Cup;No straw Medication Administration: Whole meds with puree Supervision: Patient able to self feed;Full supervision/cueing for compensatory strategies Compensations: Slow rate;Small sips/bites Postural Changes and/or Swallow Maneuvers: Seated upright 90 degrees;Upright 30-60 min after meal;Chin tuck (chin tuck with liquids)    Other  Recommendations Oral Care Recommendations: Oral care  BID   Follow Up Recommendations  Skilled Nursing facility         Pertinent Vitals/Pain No pain          Reason for Referral Objectively evaluate swallowing function   Oral Phase Oral Preparation/Oral Phase Oral Phase: Impaired Oral - Solids Oral - Pill: Delayed oral transit;Lingual pumping;Reduced posterior  propulsion   Pharyngeal Phase Pharyngeal Phase Pharyngeal Phase: Impaired Pharyngeal - Thin Pharyngeal - Thin Cup: Reduced laryngeal elevation;Reduced airway/laryngeal closure;Penetration/Aspiration during swallow;Pharyngeal residue - pyriform sinuses Penetration/Aspiration details (thin cup): Material enters airway, CONTACTS cords and not ejected out (no sensation) Pharyngeal - Solids Pharyngeal - Regular: Within functional limits  Cervical Esophageal Phase    GO    Cervical Esophageal Phase Cervical Esophageal Phase: [facility]    Functional Assessment Tool Used: clinical judgement Functional Limitations: Swallowing Swallow Current Status (W0981): At least 20 percent but less than 40 percent impaired, limited or restricted Swallow Goal Status [phone]): At least 20 percent but less than 40 percent impaired, limited or restricted Swallow Discharge Status [id number]): At least 20 percent but less than 40 percent impaired, limited or restricted    Royce Macadamia M.Ed [employer] [phone]  02/21/2012

## 2012-03-03 ENCOUNTER — Ambulatory Visit: Payer: Medicare Other | Admitting: Family Medicine

## 2012-03-17 ENCOUNTER — Encounter: Payer: Self-pay | Admitting: Family Medicine

## 2012-03-17 ENCOUNTER — Ambulatory Visit (INDEPENDENT_AMBULATORY_CARE_PROVIDER_SITE_OTHER): Payer: Medicare Other | Admitting: Family Medicine

## 2012-03-17 VITALS — BP 170/80 | Temp 98.2°F | Wt 150.0 lb

## 2012-03-17 DIAGNOSIS — I10 Essential (primary) hypertension: Secondary | ICD-10-CM

## 2012-03-17 NOTE — Patient Instructions (Signed)
Continue your current medications  Followup in 3 months sooner if any problems

## 2012-03-17 NOTE — Progress Notes (Signed)
  Subjective:    Patient ID: [name] Luna, female    DOB: [birth year], 78 y.o.   MRN: [id number]  HPI [name] Luna is a 78 year old female with a nonsmoker who lives with her son who now has her 24 hour a day caregiver. She comes in today for followup having been in the hospital for 10 days with hypotension.  Her current antihypertensive medications are Cozaar 50 mg twice a day, Lopressor 50 mg each bedtime, proamatine 2.5 mg daily  BP today 170/80 she's asymptomatic  I talked with the son about "ideal" blood pressure goals. Because of her age etc. I don't feel it's reasonable to get her blood pressure down to 140 systolic. This is been done in the past and she has issues with syncope.   Review of Systems    general and cardiovascular review of systems otherwise negative son is giving 24-hour a day care Objective:   Physical Exam  Well-developed well-nourished thin female no acute distress BP right arm sitting position 170/80      Assessment & Plan:

## 2012-04-03 IMAGING — CR DG CHEST 1V PORT
1 series · 1 of 1 positions shown · non-contrast
Comparison: Portable exam 3830 hours compared to 04/21/2010

CLINICAL DATA: Chest pain, weakness

PORTABLE CHEST - 1 VIEW

[view not recorded]
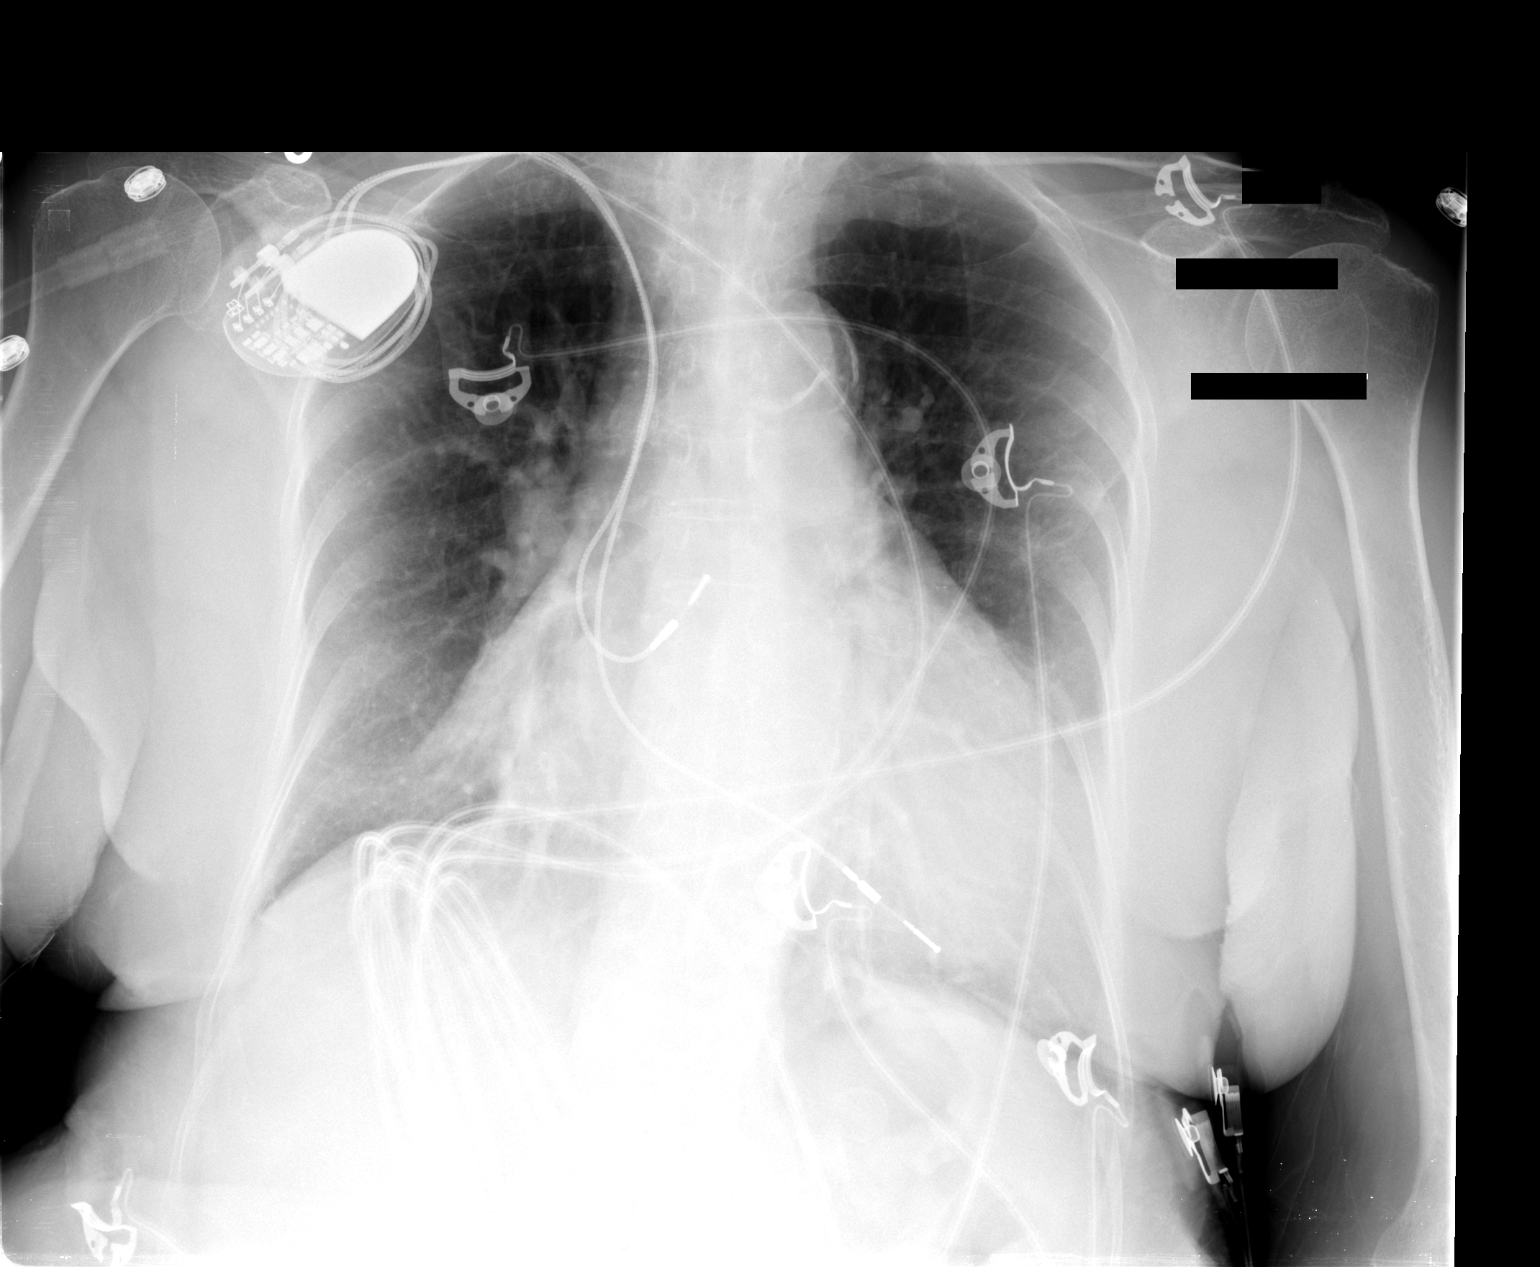

[1 of 1 positions shown; findings below may reference images not displayed]

FINDINGS: Right subclavian transvenous pacemaker leads project over right
atrium and right ventricle.
Enlargement of cardiac silhouette.
Atherosclerotic calcifications aorta.
Minimal pulmonary vascular congestion.
Improved pulmonary edema.
Resolution bibasilar effusions and infiltrates.
Lungs clear.
Bones demineralized.
IMPRESSION: Enlargement of cardiac silhouette post pacemaker.
Resolution of pulmonary infiltrates and effusion since prior exam.

## 2012-04-05 IMAGING — CT CT HEAD W/O CM
1 series · 16 of 30 positions shown, 20 images · non-contrast
Comparison: Head CT 01/14/2010

CLINICAL DATA: Confusion

CT HEAD WITHOUT CONTRAST
TECHNIQUE: Contiguous axial images were obtained from the base of
the skull through the vertex without contrast.

[Series 2: head routine 4.8 h37s · axial · 0.47mm/px · z∈[-148,+7]mm · 16 of 36 slices shown, 20 images]
[im 2/36  brain]
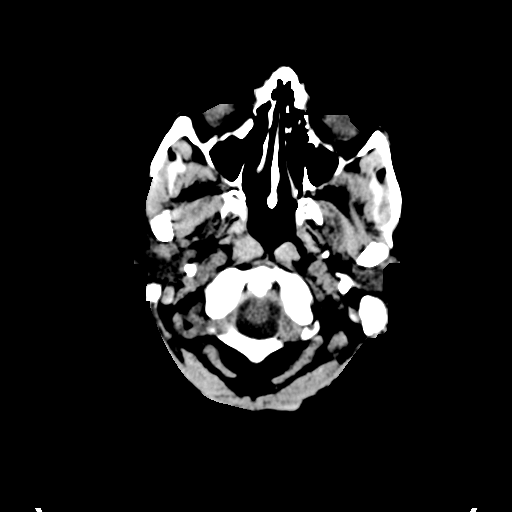
[im 2/36  bone]
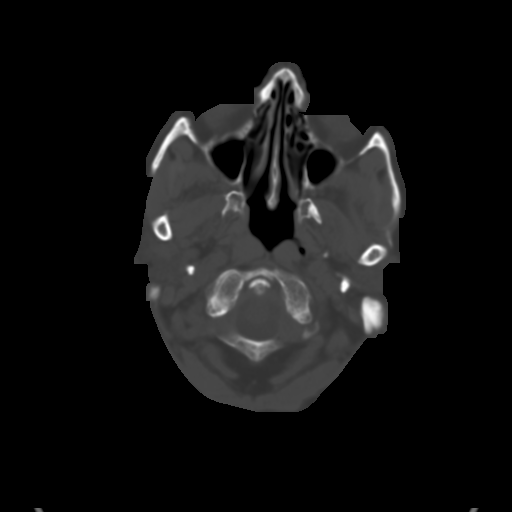
[im 4/36  brain]
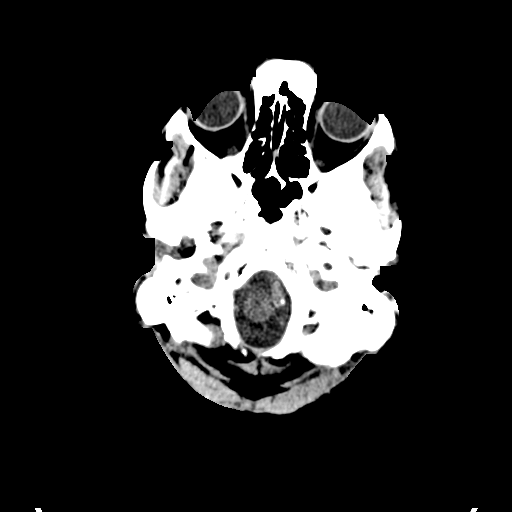
[im 7/36  brain]
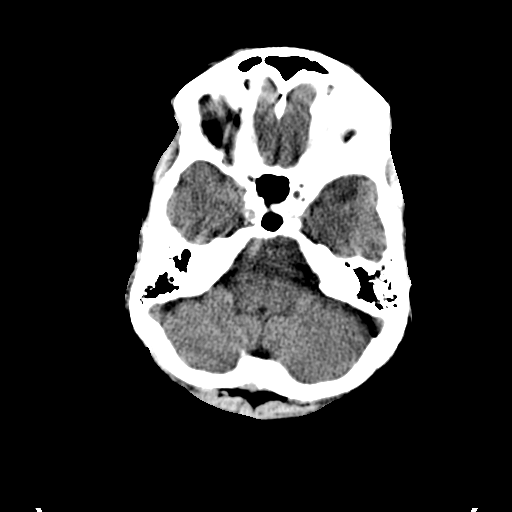
[im 9/36  brain]
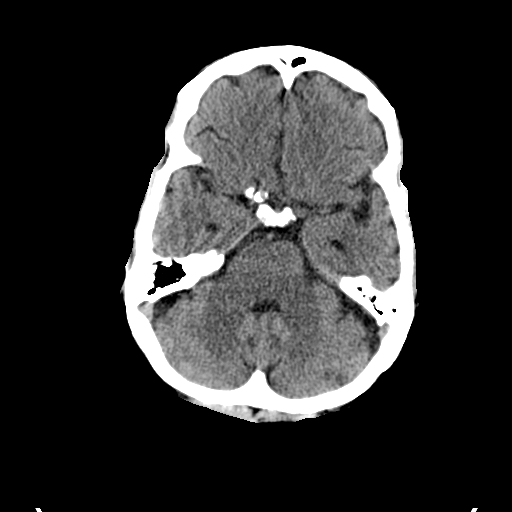
[im 10/36  brain]
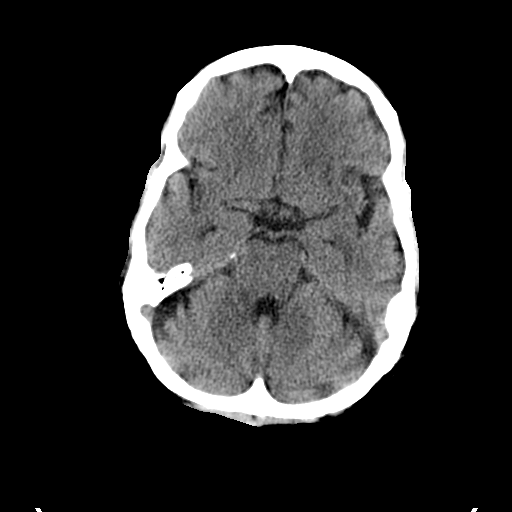
[im 10/36  bone]
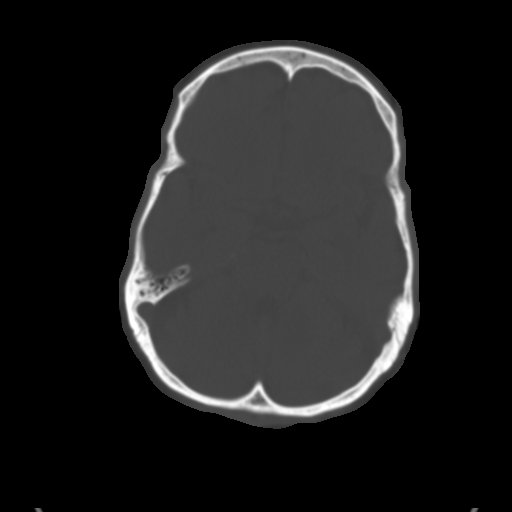
[im 13/36  brain]
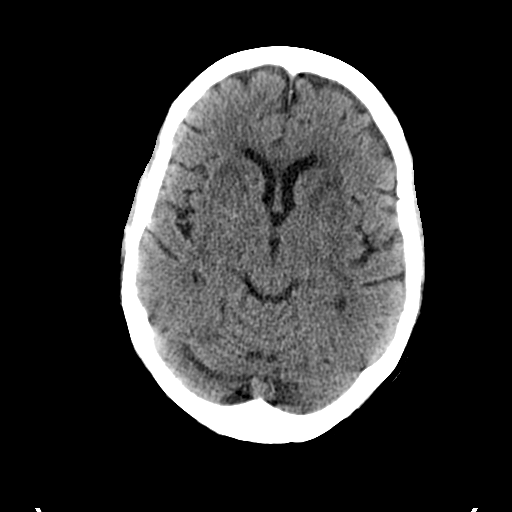
[im 15/36  brain]
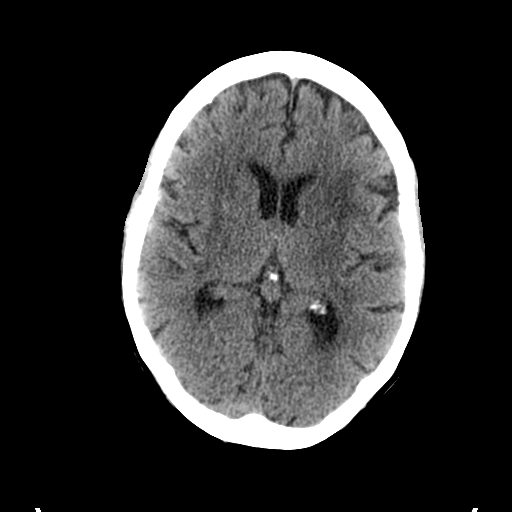
[im 17/36  brain]
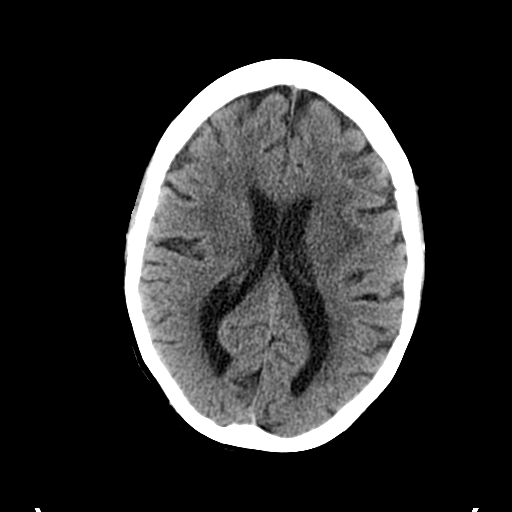
[im 19/36  brain]
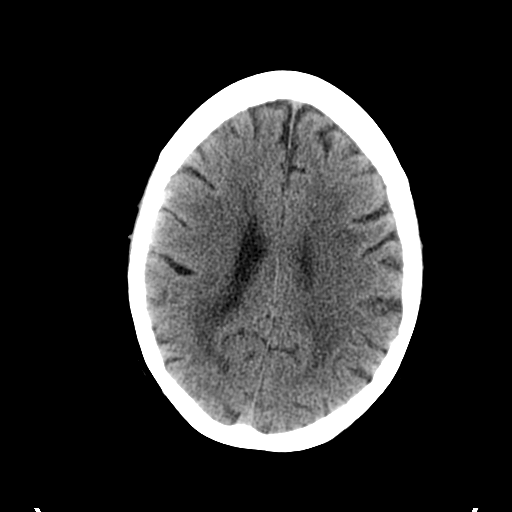
[im 19/36  bone]
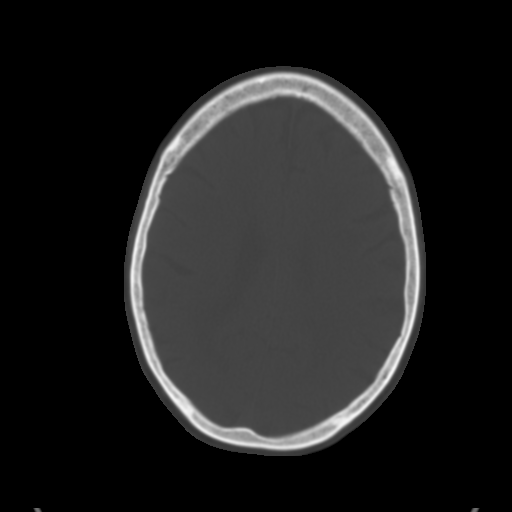
[im 21/36  brain]
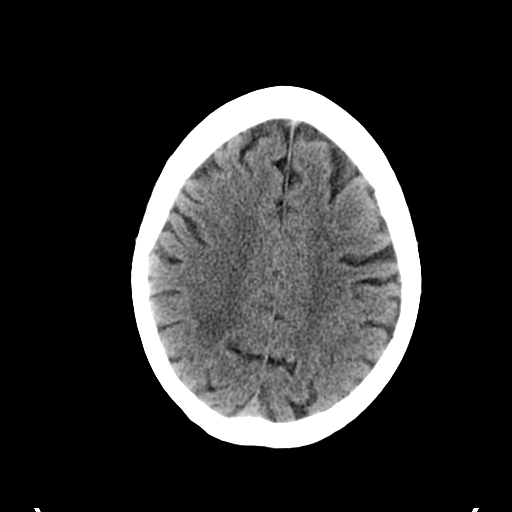
[im 23/36  brain]
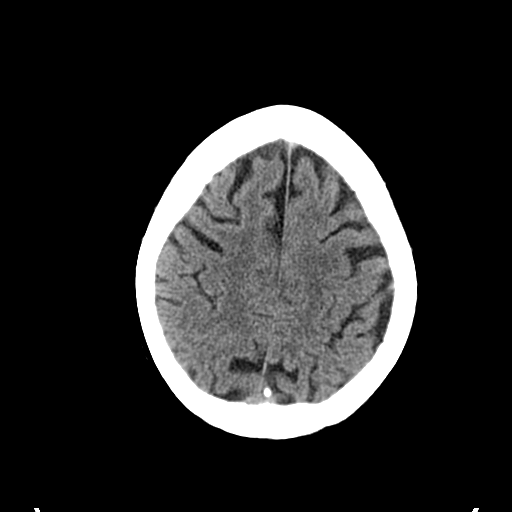
[im 26/36  brain]
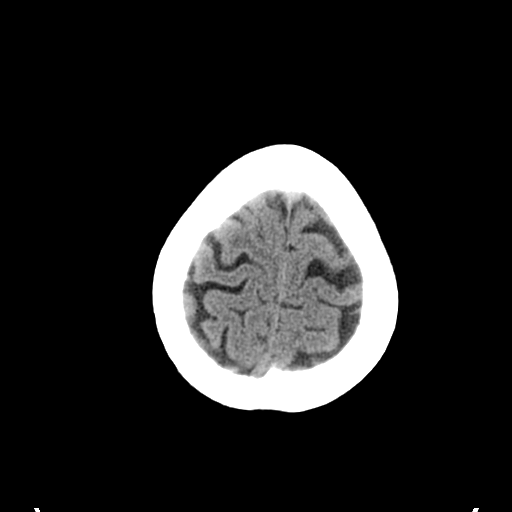
[im 27/36  brain]
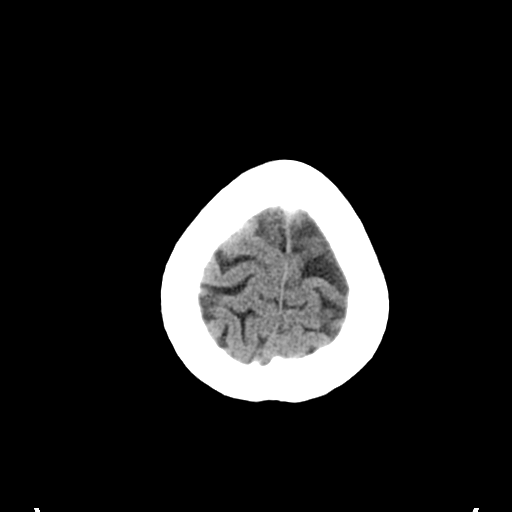
[im 27/36  bone]
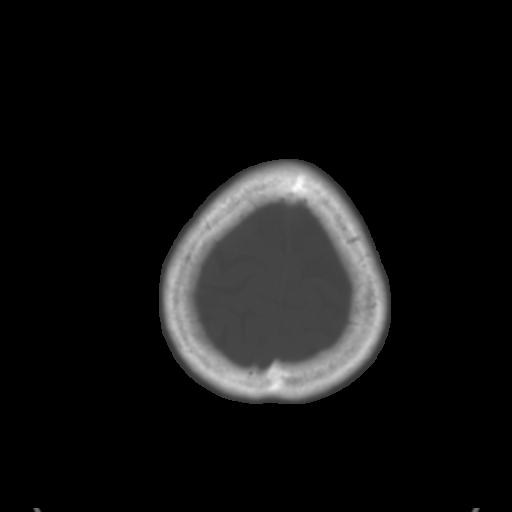
[im 29/36  brain]
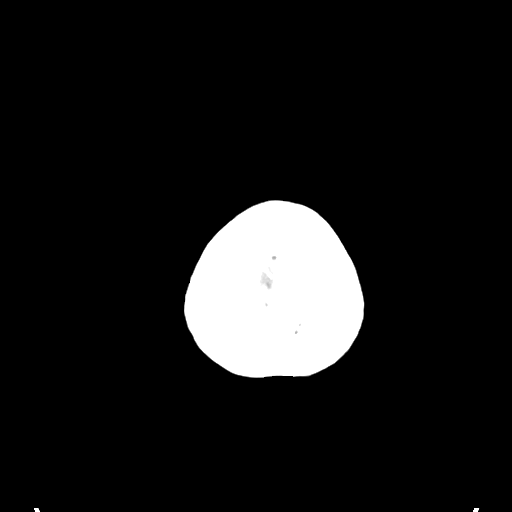
[im 32/36  brain]
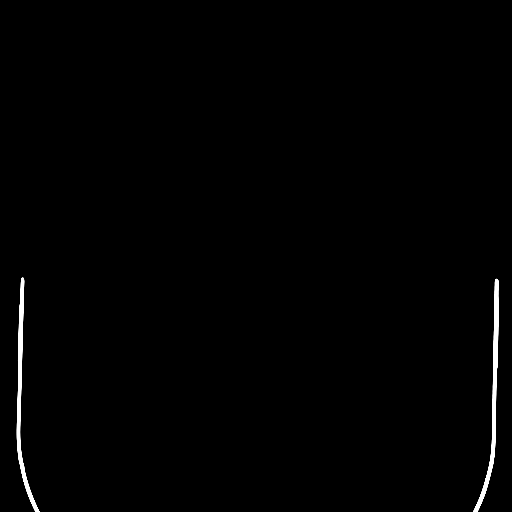
[im 34/36  brain]
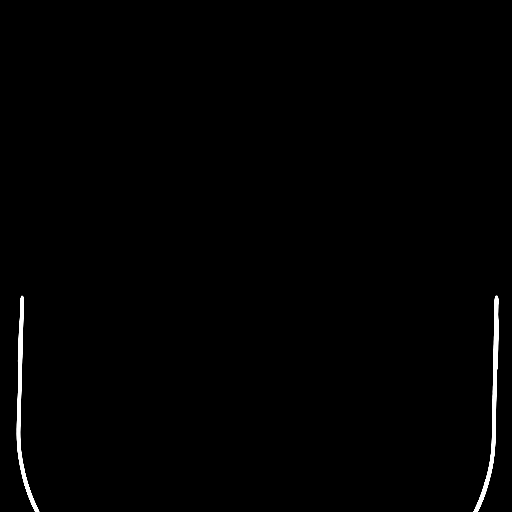

[16 of 30 positions shown; findings below may reference images not displayed]

FINDINGS: Negative for hemorrhage, hydrocephalus, mass effect, mass
lesion, or evidence of acute cortically based infarction.  Stable
patchy hypodensities in the periventricular white matter suggest
chronic microvascular ischemic changes.  Atherosclerotic vascular
calcification of the distal left vertebral artery and intracranial
internal carotid arteries.

The visualized paranasal sinuses, mastoid air cells, and middle
ears are clear.  The skull is intact.
IMPRESSION: No acute intracranial abnormality.

Stable, chronic small vessel ischemic changes.

## 2012-04-10 ENCOUNTER — Telehealth: Payer: Self-pay | Admitting: Family Medicine

## 2012-04-10 NOTE — Telephone Encounter (Signed)
Okay to use the medication they have at home one twice daily for 2 weeks

## 2012-04-10 NOTE — Telephone Encounter (Signed)
Patient Information:  Caller Name: Jonnie Kubly  Phone: [phone]  Patient: [name] Luna  Gender: Female  DOB: 10/22/1920  Age: 79 Years  PCP: Kelle Darting [facility])  Office Follow Up:  Does the office need to follow up with this patient?: Yes  Instructions For The Office: Please contact Son . He will bring patient if necessary.  He has #32 of Macrobid from a 45 day prescripiton in October (did not use because patient was admitted and rehab). Would liike to avoid bringing her out to office/flu exposure.  Can he start this Macrobid.  [facility] [phone]  RN Note:  Please contact about medication he currently has #32 tablets of Macrobid. Prefers not to get her our or expose to flu at office. Pharmacy [facility]. [phone]  Symptoms  Reason For Call & Symptoms: Son states his mother has been showing s/sx of Urinary Frequency for 3-4 daysl. Worsening last night , up frequently, 1 episode of dizziness last night- none further and brief. Afebrile. . Son states patient was given a Rx for Macrobid in October for 45 days due to UTI.  Symptoms returned and he still has #32 tablets left over of Macrobid . Should he/Could he start this medicaiton?  (1) She is 79 years old does not want to get her out expose to the flu  (2)  Please advise about UTI & medication  Reviewed Health History In EMR: Yes  Reviewed Medications In EMR: Yes  Reviewed Allergies In EMR: Yes  Reviewed Surgeries / Procedures: No  Date of Onset of Symptoms: 04/06/2012  Guideline(s) Used:  Urination Pain - Female  Disposition Per Guideline:   See Today in Office  Reason For Disposition Reached:   > 2 UTIs in last year  Advice Given:  Fluids:   Drink extra fluids. Drink 8-10 glasses of liquids a day (Reason: to produce a dilute, non-irritating urine).  Cranberry Juice:   Dosage Cranberry Juice Cocktail: 8 oz (240 ml) twice a day.  Caution: Do not drink more than 16 oz (480 ml). Here is  the reason: too much cranberry juice can also be irritating to the bladder.  Call Back If:  You become worse.  Call Back If:   Fever lasts more than 24 hours on antibiotics  Pain does not improve by day 3 on antibiotics  Urine symptoms do not improve by day 3 on antibiotics  You become worse.  Patient Refused Recommendation:  Patient Requests Prescription  Please contact Son . He will bring patient if necessary.  He has #32 of Macrobid from a 45 day prescripiton in October (did not use because patient was admitted and rehab). Would liike to avoid bringing her out to office/flu exposure.  Can he start this Macrobid.  [facility] [phone]

## 2012-04-11 NOTE — Telephone Encounter (Signed)
Patient's son aware of advice.

## 2012-04-13 ENCOUNTER — Emergency Department (HOSPITAL_COMMUNITY): Payer: Medicare Other

## 2012-04-13 ENCOUNTER — Emergency Department (HOSPITAL_COMMUNITY)
Admission: EM | Admit: 2012-04-13 | Discharge: 2012-04-13 | Disposition: A | Discharge status: Home / Self Care | Payer: Medicare Other | Attending: Emergency Medicine | Admitting: Emergency Medicine

## 2012-04-13 ENCOUNTER — Encounter (HOSPITAL_COMMUNITY): Payer: Self-pay | Admitting: *Deleted

## 2012-04-13 DIAGNOSIS — I509 Heart failure, unspecified: Secondary | ICD-10-CM | POA: Insufficient documentation

## 2012-04-13 DIAGNOSIS — Z8619 Personal history of other infectious and parasitic diseases: Secondary | ICD-10-CM | POA: Insufficient documentation

## 2012-04-13 DIAGNOSIS — Z79899 Other long term (current) drug therapy: Secondary | ICD-10-CM | POA: Insufficient documentation

## 2012-04-13 DIAGNOSIS — W19XXXA Unspecified fall, initial encounter: Secondary | ICD-10-CM

## 2012-04-13 DIAGNOSIS — Z7982 Long term (current) use of aspirin: Secondary | ICD-10-CM | POA: Insufficient documentation

## 2012-04-13 DIAGNOSIS — Y929 Unspecified place or not applicable: Secondary | ICD-10-CM | POA: Insufficient documentation

## 2012-04-13 DIAGNOSIS — I4891 Unspecified atrial fibrillation: Secondary | ICD-10-CM | POA: Insufficient documentation

## 2012-04-13 DIAGNOSIS — I2510 Atherosclerotic heart disease of native coronary artery without angina pectoris: Secondary | ICD-10-CM | POA: Insufficient documentation

## 2012-04-13 DIAGNOSIS — S20219A Contusion of unspecified front wall of thorax, initial encounter: Secondary | ICD-10-CM

## 2012-04-13 DIAGNOSIS — Z8719 Personal history of other diseases of the digestive system: Secondary | ICD-10-CM | POA: Insufficient documentation

## 2012-04-13 DIAGNOSIS — Y9301 Activity, walking, marching and hiking: Secondary | ICD-10-CM | POA: Insufficient documentation

## 2012-04-13 DIAGNOSIS — Z87440 Personal history of urinary (tract) infections: Secondary | ICD-10-CM | POA: Insufficient documentation

## 2012-04-13 DIAGNOSIS — K219 Gastro-esophageal reflux disease without esophagitis: Secondary | ICD-10-CM | POA: Insufficient documentation

## 2012-04-13 DIAGNOSIS — Z950 Presence of cardiac pacemaker: Secondary | ICD-10-CM | POA: Insufficient documentation

## 2012-04-13 DIAGNOSIS — Z8701 Personal history of pneumonia (recurrent): Secondary | ICD-10-CM | POA: Insufficient documentation

## 2012-04-13 DIAGNOSIS — T148XXA Other injury of unspecified body region, initial encounter: Secondary | ICD-10-CM | POA: Insufficient documentation

## 2012-04-13 DIAGNOSIS — E039 Hypothyroidism, unspecified: Secondary | ICD-10-CM | POA: Insufficient documentation

## 2012-04-13 DIAGNOSIS — Z9861 Coronary angioplasty status: Secondary | ICD-10-CM | POA: Insufficient documentation

## 2012-04-13 DIAGNOSIS — I10 Essential (primary) hypertension: Secondary | ICD-10-CM | POA: Insufficient documentation

## 2012-04-13 DIAGNOSIS — Z8679 Personal history of other diseases of the circulatory system: Secondary | ICD-10-CM | POA: Insufficient documentation

## 2012-04-13 DIAGNOSIS — W1809XA Striking against other object with subsequent fall, initial encounter: Secondary | ICD-10-CM | POA: Insufficient documentation

## 2012-04-13 DIAGNOSIS — Z8669 Personal history of other diseases of the nervous system and sense organs: Secondary | ICD-10-CM | POA: Insufficient documentation

## 2012-04-13 NOTE — ED Provider Notes (Signed)
History     CSN: [id number]  Arrival date & time 04/13/12  1514   First MD Initiated Contact with Patient 04/13/12 1644      Chief Complaint  Patient presents with  . Fall    (Consider location/radiation/quality/duration/timing/severity/associated sxs/prior treatment) HPI Comments: Patient was attempting to go to the bathroom with son's assistance when she fell into the vanity and injured her hand.  She also has bruises to the left lateral ribs and upper back.  She adds little history due to being hard of hearing.    Patient is a 79 y.o. female presenting with fall. The history is provided by the patient (son).  Fall The accident occurred 1 to 2 hours ago. The fall occurred while walking. She fell from a height of 1 to 2 ft. She landed on a hard floor. The volume of blood lost was minimal. Point of impact: left hand and chest. The pain is moderate.    Past Medical History  Diagnosis Date  . CHF (congestive heart failure)   . A-fib   . Hypothyroid   . GERD (gastroesophageal reflux disease)   . Hypothyroidism   . Bradycardia   . Pancreatitis   . Coronary artery disease   . Shingles 1980's    "twice; once on my front side; once on my back" (02/07/2012)  . CORONARY ARTERY DISEASE 11/28/2006    Qualifier: Diagnosis of  By: Tawanna Cooler MD, Eugenio Hoes   . Pacemaker   . Paroxysmal a-fib, history of 08/02/2011  . Tremor, essential 08/02/2011  . Esophageal disorder, with history of dilatation 08/02/2011  . Angina   . H/O hiatal hernia   . Neuromuscular disorder     essential tremors  . UTI (lower urinary tract infection) 08/03/2011  . Dizziness, near syncope 11/07/2011  . Macular degeneration, dry     "both eyes" (02/07/2012)  . Hypertension   . HTN (hypertension), accelerated this admit 11/08/2011  . Pneumonia 1927    Past Surgical History  Procedure Date  . Repair spigelian hernia   . S/p childbirth     x2  . Cataract extraction w/ intraocular lens  implant, bilateral ~ 1997  . Coronary  angioplasty with stent placement 2008  . Vaginal hysterectomy 1970's  . Left oophorectomy 1970's  . Insert / replace / remove pacemaker 2000; 2009    initial placement; replaced  . Appendectomy 1933  . Cholecystectomy 1950's  . Hernia repair   . Esophageal dilation     "once" (02/07/2012)    Family History  Problem Relation Age of Onset  . Sudden death Mother     History  Substance Use Topics  . Smoking status: Never Smoker   . Smokeless tobacco: Never Used  . Alcohol Use: No    OB History    Grav Para Term Preterm Abortions TAB SAB Ect Mult Living                  Review of Systems  All other systems reviewed and are negative.    Allergies  Codeine; Isosorbide mononitrate; Lorazepam; Penicillins; Prednisone; and Valium  Home Medications   Current Outpatient Rx  Name  Route  Sig  Dispense  Refill  . AMIODARONE HCL 200 MG PO TABS   Oral   Take 200 mg by mouth daily.           . ASPIRIN 81 MG PO TBEC   Oral   Take 81 mg by mouth daily.           [name]  CALCIUM CARBONATE ANTACID 500 MG PO CHEW   Oral   Chew 1 tablet by mouth daily.          [name] CLOPIDOGREL BISULFATE 75 MG PO TABS   Oral   Take 75 mg by mouth daily.           [name] LEVOTHYROXINE SODIUM 75 MCG PO TABS   Oral   Take 75 mcg by mouth daily.         [name] LORATADINE 10 MG PO TABS   Oral   Take 10 mg by mouth daily.           [name] LOSARTAN POTASSIUM 50 MG PO TABS   Oral   Take 1 tablet (50 mg total) by mouth 2 (two) times daily.   60 tablet   5     Administration Instructions:  25mg  QAM, 50mg  QPM   . METOPROLOL TARTRATE 50 MG PO TABS   Oral   Take 1 tablet (50 mg total) by mouth at bedtime.   60 tablet   5     Administration Instructions:  25 mg QAM,  50mg  QPM   . MIDODRINE HCL 2.5 MG PO TABS   Oral   Take 1 tablet (2.5 mg total) by mouth daily before breakfast.   30 tablet   5   . MULTIVITAMINS PO CAPS   Oral   Take 1 capsule by mouth daily.           [name] PRESERVISION/LUTEIN  PO CAPS   Oral   Take by mouth 2 (two) times daily.           [name] NITROFURANTOIN MONOHYD MACRO 100 MG PO CAPS   Oral   Take 100 mg by mouth 2 (two) times daily. For 2 weeks; Start date 04/11/12         . NITROGLYCERIN 0.4 MG SL SUBL   Sublingual   Place 0.4 mg under the tongue every 5 (five) minutes as needed. For chest pain         . PANTOPRAZOLE SODIUM 40 MG PO TBEC   Oral   Take 40 mg by mouth 2 (two) times daily.         [name] POLYETHYLENE GLYCOL 3350 PO PACK   Oral   Take 17 g by mouth daily as needed. For constipation         . RANOLAZINE ER 500 MG PO TB12   Oral   Take 500 mg by mouth 2 (two) times daily.         [name] SIMVASTATIN 20 MG PO TABS   Oral   Take 20 mg by mouth every evening.         [name] TRAMADOL HCL 50 MG PO TABS   Oral   Take 0.5 tablets (25 mg total) by mouth at bedtime.   30 tablet   5     BP 174/81  Pulse 89  Temp 97.9 F (36.6 C) (Oral)  Resp 18  SpO2 93%  Physical Exam  Nursing note and vitals reviewed. Constitutional: She is oriented to person, place, and time.       Elderly female in no acute distress.  HENT:  Head: Normocephalic and atraumatic.  Mouth/Throat: Oropharynx is clear and moist.  Eyes: EOM are normal. Pupils are equal, round, and reactive to light.  Neck: Normal range of motion. Neck supple.       There is a contusion to the soft tissue of the anterior neck but no laryngeal  ttp or stridor.   Cardiovascular: Normal rate and regular rhythm.   No murmur heard. Pulmonary/Chest: Effort normal and breath sounds normal. No respiratory distress.  Abdominal: Soft. Bowel sounds are normal.  Musculoskeletal: Normal range of motion.       There are several contusions to the left lateral ribs and back.    There is an ecchymosis of the dorsum of the left hand along with a 3.5 cm skin tear.    Neurological: She is alert and oriented to person, place, and time.  Skin: Skin is warm and dry.    ED Course  Procedures (including  critical care time)  Labs Reviewed - No data to display Dg Hand Complete Left  04/13/2012  *RADIOLOGY REPORT*  Clinical Data: Laceration to posterior left hand  LEFT HAND - COMPLETE 3+ VIEW  Comparison: None.  Findings: Bones appear mildly osteopenic.  No acute fracture or subluxation noted.  There is soft tissue swelling along the dorsum of the metacarpal bones.  No radiopaque foreign bodies identified.  IMPRESSION:  1.  Soft tissue swelling. 2.  No acute bony abnormalities.   Original Report Authenticated By: Signa Kell, M.D.      No diagnosis found.    MDM  The xrays do not show a fracture of the hand or wrist.  There is a skin tear to the dorsum of the left hand that steri-strips will be applied to.  She will return prn.        Geoffery Lyons, MD 04/13/12 [place]

## 2012-04-13 NOTE — ED Notes (Signed)
Pt had fall at home. Has skin tear to left posterior hand and abrasion to anterior neck. Denies loc.

## 2012-04-22 ENCOUNTER — Telehealth: Payer: Self-pay | Admitting: Family Medicine

## 2012-04-22 ENCOUNTER — Ambulatory Visit: Payer: Medicare Other | Admitting: Family Medicine

## 2012-04-22 NOTE — Telephone Encounter (Signed)
Error

## 2012-04-25 ENCOUNTER — Telehealth: Payer: Self-pay | Admitting: Family Medicine

## 2012-04-25 NOTE — Telephone Encounter (Signed)
Pt missed her OT visit w/home health this wk due to illness. Therapist asking for order to extend 1 more visit. Please advise and call.

## 2012-04-28 NOTE — Telephone Encounter (Signed)
Okay 

## 2012-04-30 NOTE — Telephone Encounter (Signed)
Left message on machine for [facility]

## 2012-05-01 ENCOUNTER — Telehealth: Payer: Self-pay | Admitting: Family Medicine

## 2012-05-01 NOTE — Telephone Encounter (Signed)
Spoke with Tiffany

## 2012-05-01 NOTE — Telephone Encounter (Signed)
[facility]iffany from [facility] is requesting verbal order to continue physical therapy

## 2012-05-02 ENCOUNTER — Emergency Department (HOSPITAL_COMMUNITY)
Admission: EM | Admit: 2012-05-02 | Discharge: 2012-05-02 | Disposition: A | Discharge status: Home / Self Care | Payer: Medicare Other | Attending: Emergency Medicine | Admitting: Emergency Medicine

## 2012-05-02 ENCOUNTER — Encounter (HOSPITAL_COMMUNITY): Payer: Self-pay | Admitting: *Deleted

## 2012-05-02 ENCOUNTER — Telehealth: Payer: Self-pay | Admitting: Family Medicine

## 2012-05-02 ENCOUNTER — Emergency Department (HOSPITAL_COMMUNITY): Payer: Medicare Other

## 2012-05-02 DIAGNOSIS — Z950 Presence of cardiac pacemaker: Secondary | ICD-10-CM | POA: Insufficient documentation

## 2012-05-02 DIAGNOSIS — Z8701 Personal history of pneumonia (recurrent): Secondary | ICD-10-CM | POA: Insufficient documentation

## 2012-05-02 DIAGNOSIS — Z79899 Other long term (current) drug therapy: Secondary | ICD-10-CM | POA: Insufficient documentation

## 2012-05-02 DIAGNOSIS — Z9861 Coronary angioplasty status: Secondary | ICD-10-CM | POA: Insufficient documentation

## 2012-05-02 DIAGNOSIS — R531 Weakness: Secondary | ICD-10-CM

## 2012-05-02 DIAGNOSIS — R42 Dizziness and giddiness: Secondary | ICD-10-CM | POA: Insufficient documentation

## 2012-05-02 DIAGNOSIS — Z7982 Long term (current) use of aspirin: Secondary | ICD-10-CM | POA: Insufficient documentation

## 2012-05-02 DIAGNOSIS — K219 Gastro-esophageal reflux disease without esophagitis: Secondary | ICD-10-CM | POA: Insufficient documentation

## 2012-05-02 DIAGNOSIS — I10 Essential (primary) hypertension: Secondary | ICD-10-CM | POA: Insufficient documentation

## 2012-05-02 DIAGNOSIS — R5383 Other fatigue: Secondary | ICD-10-CM | POA: Insufficient documentation

## 2012-05-02 DIAGNOSIS — Z8669 Personal history of other diseases of the nervous system and sense organs: Secondary | ICD-10-CM | POA: Insufficient documentation

## 2012-05-02 DIAGNOSIS — Z8719 Personal history of other diseases of the digestive system: Secondary | ICD-10-CM | POA: Insufficient documentation

## 2012-05-02 DIAGNOSIS — Z8619 Personal history of other infectious and parasitic diseases: Secondary | ICD-10-CM | POA: Insufficient documentation

## 2012-05-02 DIAGNOSIS — R5381 Other malaise: Secondary | ICD-10-CM | POA: Insufficient documentation

## 2012-05-02 DIAGNOSIS — I4891 Unspecified atrial fibrillation: Secondary | ICD-10-CM | POA: Insufficient documentation

## 2012-05-02 DIAGNOSIS — Z7902 Long term (current) use of antithrombotics/antiplatelets: Secondary | ICD-10-CM | POA: Insufficient documentation

## 2012-05-02 DIAGNOSIS — I2510 Atherosclerotic heart disease of native coronary artery without angina pectoris: Secondary | ICD-10-CM | POA: Insufficient documentation

## 2012-05-02 DIAGNOSIS — Z87440 Personal history of urinary (tract) infections: Secondary | ICD-10-CM | POA: Insufficient documentation

## 2012-05-02 DIAGNOSIS — E039 Hypothyroidism, unspecified: Secondary | ICD-10-CM | POA: Insufficient documentation

## 2012-05-02 DIAGNOSIS — Z8679 Personal history of other diseases of the circulatory system: Secondary | ICD-10-CM | POA: Insufficient documentation

## 2012-05-02 LAB — COMPREHENSIVE METABOLIC PANEL
ALT: 13 U/L (ref 0–35)
AST: 21 U/L (ref 0–37)
Albumin: 3.5 g/dL (ref 3.5–5.2)
CO2: 27 mEq/L (ref 19–32)
Calcium: 9 mg/dL (ref 8.4–10.5)
Chloride: 102 mEq/L (ref 96–112)
Creatinine, Ser: 0.76 mg/dL (ref 0.50–1.10)
GFR calc non Af Amer: 73 mL/min — ABNORMAL LOW (ref >90)
Sodium: 138 mEq/L (ref 135–145)
Total Bilirubin: 0.5 mg/dL (ref 0.3–1.2)

## 2012-05-02 LAB — CBC
Platelets: 253 10*3/uL (ref 150–400)
RBC: 3.91 MIL/uL (ref 3.87–5.11)
RDW: 16.4 % — ABNORMAL HIGH (ref 11.5–15.5)
WBC: 5 10*3/uL (ref 4.0–10.5)

## 2012-05-02 LAB — URINALYSIS, ROUTINE W REFLEX MICROSCOPIC
Glucose, UA: NEGATIVE mg/dL
Ketones, ur: NEGATIVE mg/dL
Leukocytes, UA: NEGATIVE
Nitrite: NEGATIVE
Specific Gravity, Urine: 1.02 (ref 1.005–1.030)
pH: 7 (ref 5.0–8.0)

## 2012-05-02 LAB — TROPONIN I: Troponin I: <0.3 ng/mL (ref <0.30)

## 2012-05-02 MED ORDER — LABETALOL HCL 5 MG/ML IV SOLN
10.0000 mg | Freq: Once | INTRAVENOUS | Status: DC
  Filled 2012-05-02: qty 4

## 2012-05-02 MED ORDER — ACETAMINOPHEN 325 MG PO TABS
650.0000 mg | ORAL_TABLET | Freq: Once | ORAL | Status: DC
  Filled 2012-05-02: qty 2

## 2012-05-02 NOTE — ED Notes (Signed)
Spoke with pt's son re: dcp for pt.  Pt doesn't qualify for admission and family cannot private pay for her NH stay for rehab.  Son encouraged to explore LTC Medicaid for pt, as well as HHC for pt.  Emotional support offered.

## 2012-05-02 NOTE — ED Notes (Signed)
Patient transported to CT 

## 2012-05-02 NOTE — Telephone Encounter (Signed)
Angela called. States she spoke w/son today and he states that [name] Luna continues to weaken. Was not able to walk last night - had to use wheelchair to get her into the bed. Please advise.

## 2012-05-02 NOTE — ED Notes (Signed)
Patient transported to X-ray 

## 2012-05-02 NOTE — Telephone Encounter (Signed)
Patient sent to the hospital

## 2012-05-02 NOTE — ED Provider Notes (Signed)
History     CSN: [id number]  Arrival date & time 05/02/12  1307   First MD Initiated Contact with Patient 05/02/12 1328      Chief Complaint  Patient presents with  . Weakness    (Consider location/radiation/quality/duration/timing/severity/associated sxs/prior treatment) HPI Pt presents with c/o generalized weakness also with association of sensation of spinning.  She states she began feeling dizzy/spinning last night and it became worse this morning when getting up with her son's assistance to go to the bathroom.  She almost fell due to generalized weakness but didn't.   She denies syncope.  No vomiting, no shortness of breath or chest pain.  Has had some nausea this morning but specifically denies abdominal pain to me.  No leg swelling.  No fever/chills.  She has been on macrobid and then septra for a UTI which did not clear initially with macrobid.  She says she has had a hx of vertigo in the past but denies feeling it recently.  No weakness in arms or legs, no changes in speech or vision.  There are no other associated systemic symptoms, there are no other alleviating or modifying factors.   Past Medical History  Diagnosis Date  . CHF (congestive heart failure)   . A-fib   . Hypothyroid   . GERD (gastroesophageal reflux disease)   . Hypothyroidism   . Bradycardia   . Pancreatitis   . Coronary artery disease   . Shingles 1980's    "twice; once on my front side; once on my back" (02/07/2012)  . CORONARY ARTERY DISEASE 11/28/2006    Qualifier: Diagnosis of  By: Tawanna Cooler MD, Eugenio Hoes   . Pacemaker   . Paroxysmal a-fib, history of 08/02/2011  . Tremor, essential 08/02/2011  . Esophageal disorder, with history of dilatation 08/02/2011  . Angina   . H/O hiatal hernia   . Neuromuscular disorder     essential tremors  . UTI (lower urinary tract infection) 08/03/2011  . Dizziness, near syncope 11/07/2011  . Macular degeneration, dry     "both eyes" (02/07/2012)  . Hypertension   . HTN  (hypertension), accelerated this admit 11/08/2011  . Pneumonia 1927    Past Surgical History  Procedure Date  . Repair spigelian hernia   . S/p childbirth     x2  . Cataract extraction w/ intraocular lens  implant, bilateral ~ 1997  . Coronary angioplasty with stent placement 2008  . Vaginal hysterectomy 1970's  . Left oophorectomy 1970's  . Insert / replace / remove pacemaker 2000; 2009    initial placement; replaced  . Appendectomy 1933  . Cholecystectomy 1950's  . Hernia repair   . Esophageal dilation     "once" (02/07/2012)    Family History  Problem Relation Age of Onset  . Sudden death Mother     History  Substance Use Topics  . Smoking status: Never Smoker   . Smokeless tobacco: Never Used  . Alcohol Use: No    OB History    Grav Para Term Preterm Abortions TAB SAB Ect Mult Living                  Review of Systems ROS reviewed and all otherwise negative except for mentioned in HPI  Allergies  Codeine; Isosorbide mononitrate; Lorazepam; Penicillins; Imdur; Prednisone; and Valium  Home Medications   Current Outpatient Rx  Name  Route  Sig  Dispense  Refill  . AMIODARONE HCL 200 MG PO TABS   Oral  Take 200 mg by mouth daily.           . ASPIRIN 81 MG PO TBEC   Oral   Take 81 mg by mouth daily.           [name] CALCIUM CARBONATE ANTACID 500 MG PO CHEW   Oral   Chew 1 tablet by mouth daily.          [name] CLOPIDOGREL BISULFATE 75 MG PO TABS   Oral   Take 75 mg by mouth daily.           [name] LEVOTHYROXINE SODIUM 75 MCG PO TABS   Oral   Take 75 mcg by mouth daily.         [name] LORATADINE 10 MG PO TABS   Oral   Take 10 mg by mouth daily.           [name] LOSARTAN POTASSIUM 50 MG PO TABS   Oral   Take 50 mg by mouth 2 (two) times daily.         [name] METOPROLOL TARTRATE 50 MG PO TABS   Oral   Take 50 mg by mouth at bedtime.         [name] MIDODRINE HCL 2.5 MG PO TABS   Oral   Take 2.5 mg by mouth daily before breakfast.         . MULTIVITAMINS  PO CAPS   Oral   Take 1 capsule by mouth daily.           [name] PRESERVISION/LUTEIN PO CAPS   Oral   Take by mouth 2 (two) times daily.           [name] PANTOPRAZOLE SODIUM 40 MG PO TBEC   Oral   Take 40 mg by mouth 2 (two) times daily.         [name] POLYETHYLENE GLYCOL 3350 PO PACK   Oral   Take 17 g by mouth daily as needed. For constipation         . RANOLAZINE ER 500 MG PO TB12   Oral   Take 500 mg by mouth 2 (two) times daily.         [name] SIMVASTATIN 20 MG PO TABS   Oral   Take 20 mg by mouth every evening.         [name] TRAMADOL HCL 50 MG PO TABS   Oral   Take 25 mg by mouth at bedtime.         [name] TRIMETHOPRIM 100 MG PO TABS   Oral   Take 100 mg by mouth at bedtime.         [name] NITROGLYCERIN 0.4 MG SL SUBL   Sublingual   Place 0.4 mg under the tongue every 5 (five) minutes as needed. For chest pain           BP 187/67  Pulse 79  Temp 98.3 F (36.8 C) (Oral)  Resp 16  SpO2 94% Vitals reviewed Physical Exam Physical Examination: General appearance - alert, tired but well appearing, and in no distress Mental status - alert, oriented to person, place, and time Eyes - pupils equal and reactive, extraocular eye movements intact Mouth - mucous membranes moist, pharynx normal without lesions Chest - clear to auscultation, no wheezes, rales or rhonchi, symmetric air entry Heart - normal rate, regular rhythm, normal S1, S2, no murmurs, rubs, clicks or gallops Abdomen - soft, nontender, nondistended, no masses or organomegaly Neurological - alert, oriented, normal speech, no focal  findings, strength 5/5 in extremities x 4, sensation intact Extremities - peripheral pulses normal, no pedal edema, no clubbing or cyanosis Skin - normal coloration and turgor, no rashes  ED Course  Procedures (including critical care time)   Date: 05/02/2012  Rate: 73  Rhythm: normal sinus rhythm  QRS Axis: normal  Intervals: PR prolonged  ST/T Wave abnormalities: normal  Conduction  Disutrbances:first-degree A-V block   Narrative Interpretation:   Old EKG Reviewed: unchanged  5:35 PM I have discussed with [facility] hospitalist, Dr. Ardyth Harps- she advised ambulating the patient as she does not meet criteria for medical admission.  Upon attempt to ambulate patient she is unable to stand, was incontinent of urine and is unable to take even one step.  I discussed again with Dr. Ardyth Harps who states this is still a placement issue as the medical workup is unrevealing.  She has reviewed the medical records (and I have as well), and these symptoms are chronic in nature- including weakness, syncope, vertigo.  She has been weaned from her BP meds due to orthostatic hypotension and is now hypertensive.  Dr. Ardyth Harps advises contacting place management to see if patient can be placed from ED or if admission would be helpful for placement.  I have placed page to case management now.    6:08 PM I have talked with case management on call.  She has taken down all the information and states she will call me back.    6:32 PM d/w case management again, she has d/w her supervisor and has also called jody, social work.  Augusto Gamble is coming to assess the patient and the situation to see what she can offer.    6:35 PM social work is here in the ED now  7:16 PM SW has talked with patient and son.  She has advised them to look into applying for medicaid as this may help if she needs placement.  Son is agreeable with plan for discharge.  Advised to contact PMD to work on placement if this is needed.     Labs Reviewed  CBC - Abnormal; Notable for the following:    Hemoglobin 10.7 (*)     HCT 33.5 (*)     RDW 16.4 (*)     All other components within normal limits  COMPREHENSIVE METABOLIC PANEL - Abnormal; Notable for the following:    Alkaline Phosphatase 122 (*)     GFR calc non Af Amer 73 (*)     GFR calc Af Amer 84 (*)     All other components within normal limits  URINALYSIS, ROUTINE W REFLEX  MICROSCOPIC - Abnormal; Notable for the following:    APPearance TURBID (*)     All other components within normal limits  URINE MICROSCOPIC-ADD ON - Abnormal; Notable for the following:    Bacteria, UA MANY (*)     All other components within normal limits  LIPASE, BLOOD  TROPONIN I  URINE CULTURE   Dg Chest 2 View  05/02/2012  *RADIOLOGY REPORT*  Clinical Data: Worsening weakness, history of CHF and atrial fibrillation  CHEST - 2 VIEW  Comparison: 04/13/2012; 02/06/2012; 12/04/2010  Findings: Grossly unchanged enlarged cardiac silhouette and mediastinal contours with atherosclerotic calcifications within the aortic arch and descending thoracic aorta. Post coronary artery stenting.  Stable positioning of support apparatus.  Interval increase in chronic left-sided pleural effusion and associated left basilar/retrocardiac consolidative opacities.  Grossly unchanged right peri and infrahilar heterogeneous opacities.  Mild cephalization of flow without frank  evidence of edema.  No definite pneumothorax.  Unchanged bones. Post cholecystectomy.  IMPRESSION: 1.  Interval increase in chronic left-sided effusion and associated left basilar consolidative opacities.  2. Pulmonary venous congestion without frank evidence of edema.   Original Report Authenticated By: Tacey Ruiz, MD    Ct Head Wo Contrast  05/02/2012  *RADIOLOGY REPORT*  Clinical Data: Headache, weakness, dizziness  CT HEAD WITHOUT CONTRAST  Technique:  Contiguous axial images were obtained from the base of the skull through the vertex without contrast.  Comparison: 01/23/2012  Findings: No skull fracture is noted.  Paranasal sinuses and mastoid air cells are unremarkable.  Stable cerebral atrophy.  No intracranial hemorrhage, mass effect or midline shift.  Stable periventricular and subcortical white matter decreased attenuation consistent with chronic small vessel ischemic changes.  No acute cortical infarction.  No mass lesion is noted on this  unenhanced scan.  IMPRESSION: No acute intracranial abnormality.  Stable atrophy and chronic white matter disease.   Original Report Authenticated By: Natasha Mead, M.D.      1. Weakness   2. Hypertension       MDM  Pt presenting with generalized weakness, medical workup reassuring.  Per chart review and d/w [facility] hospitalist patient has been admitted for similar symptoms in the past.  She is hypertensive now due to weaning off her bp meds due to orthostatic hypotension.  Son declined giving her labetalol for her BP due to the lability of her blood pressure.  MRI not able to be obtained due to pacemaker- per chart review and d/w hospitalist who is familiar with this patient the vertigo is a chronic problem making acute posterior stroke less likely.  Mutliple d/w son, patient, hospitalist, social work and case management about dispo for this patient.  Son at this point after discussing his options with social work for placement would prefer to take her home and will d/w PMD.  Discharged with strict return precautions.  Pt agreeable with plan.        Ethelda Chick, MD 05/03/12 1256

## 2012-05-02 NOTE — ED Notes (Signed)
Pt recently discharged from [facility] for rehab for weakness. Woke up this am feeling "bad" and weak. Son assisted pt with walker and pt almost fell. Called EMS to bring pt to ER for further evaluation. Pt recently started on vasopressor to increase BP for hypotension. Pt complaining of abdominal pain and nausea at this time.

## 2012-05-02 NOTE — ED Notes (Signed)
Attempted to ambulate pt. Pt unable to stand up with out assistance of 2. While attempting to hold pt up pt unable to bear own weight and lost control of bladder. BP increased to 213/89 and HR increased to 101. Pt unsteady on feet.

## 2012-05-03 LAB — URINE CULTURE
Colony Count: NO GROWTH
Culture: NO GROWTH

## 2012-05-06 ENCOUNTER — Telehealth: Payer: Self-pay | Admitting: Family Medicine

## 2012-05-06 NOTE — Telephone Encounter (Signed)
RN from [facility] requesting order for social work consult. After being at [facility] today, he feels consideration of placement to an LTC Facility is appropriate. Please advise and call.

## 2012-05-06 NOTE — Telephone Encounter (Signed)
I totally agree she needs to be in a long-term facility okay for social service consult

## 2012-05-07 NOTE — Care Management (Signed)
05/02/12 1800- Care Manager on call received call from Dr. Karma Ganja ED physician regarding patient and the possible need for patient for SNF placement. Per Dr Karma Ganja patient is already active with [facility] but had questions regarding SNF placement.  Nurse Care Manager called Lennox Laity CSW in the ED at [facility] and discussed with her. She states she will follow up with patient and family.

## 2012-05-07 NOTE — Telephone Encounter (Signed)
Spoke with [employer]

## 2012-05-12 ENCOUNTER — Telehealth: Payer: Self-pay | Admitting: Family Medicine

## 2012-05-12 NOTE — Telephone Encounter (Signed)
[name] Luna with [facility] called stating that they need an order to extend the OT and add a bath aide. Please assist. Please assist.

## 2012-05-13 NOTE — Telephone Encounter (Signed)
Order faxed.

## 2012-06-23 ENCOUNTER — Other Ambulatory Visit: Payer: Self-pay | Admitting: Family Medicine

## 2012-07-08 ENCOUNTER — Ambulatory Visit (INDEPENDENT_AMBULATORY_CARE_PROVIDER_SITE_OTHER): Payer: Medicare Other | Admitting: Family Medicine

## 2012-07-08 ENCOUNTER — Encounter: Payer: Self-pay | Admitting: Family Medicine

## 2012-07-08 VITALS — BP 160/80 | Temp 98.1°F | Wt 150.0 lb

## 2012-07-08 DIAGNOSIS — I2510 Atherosclerotic heart disease of native coronary artery without angina pectoris: Secondary | ICD-10-CM

## 2012-07-08 DIAGNOSIS — F0390 Unspecified dementia without behavioral disturbance: Secondary | ICD-10-CM

## 2012-07-08 DIAGNOSIS — I10 Essential (primary) hypertension: Secondary | ICD-10-CM

## 2012-07-08 NOTE — Patient Instructions (Signed)
Stopped the calcium supplement  Continue current medications  Followup in 6 months sooner if any problems

## 2012-07-08 NOTE — Progress Notes (Signed)
  Subjective:    Patient ID: [name] Luna, female    DOB: [birth year], 79 y.o.   MRN: [id number]  HPI [name] Luna is a 79 year old female who comes in today accompanied by her son who is her full-time caregiver x3 years for evaluation  She has multiple medical problems and medications which were reviewed and it tends essentially no changes. At this juncture in her life it is about safety and good home care. Her son stays with her 7 days a week 24 hours a day. He hasn't had a vacation in 3 years. He has a sister and extended living therefore is not able to help. At some point he states that his mom will need to go a nursing home but this juncture he prefers to continue to take care of her himself   Review of Systems    review of systems all otherwise negative Objective:   Physical Exam  Well-developed well-nourished female no acute distress BP 160/80 pulse 70 and regular cardiac exam normal no edema lungs clear      Assessment & Plan:  Hypertension adult continue current therapy  Coronary disease asymptomatic continue current therapy followup in 6 months

## 2012-09-02 ENCOUNTER — Encounter: Payer: Self-pay | Admitting: Family Medicine

## 2012-09-02 ENCOUNTER — Ambulatory Visit (INDEPENDENT_AMBULATORY_CARE_PROVIDER_SITE_OTHER): Payer: Medicare Other | Admitting: Family Medicine

## 2012-09-02 VITALS — BP 160/98 | Temp 98.3°F | Wt 153.0 lb

## 2012-09-02 DIAGNOSIS — H918X9 Other specified hearing loss, unspecified ear: Secondary | ICD-10-CM

## 2012-09-02 DIAGNOSIS — H6120 Impacted cerumen, unspecified ear: Secondary | ICD-10-CM

## 2012-09-02 NOTE — Progress Notes (Signed)
  Subjective:    Patient ID: [name] Luna, female    DOB: 1921-03-23, 79 y.o.   MRN: [id number]  HPI [name] Luna is a 79 year old female who comes in today accompanied by her son who is her 24-hour a day caregiver now  She's noticed decreased hearing and drainage from both ears for couple weeks  She's complaining a rash on her abdomen  Based on her current condition I would recommend going forward strictly comfort care. I do not feel like she needs to be admitted to the hospital for any further cardiac studies   Review of Systems    review of systems negative Objective:   Physical Exam  Well-developed well-nourished female no acute distress HEENT pertinent she has bilateral cerumen impactions examination the abdomen shows 3 small little red bumps      Assessment & Plan:  Bilateral she'll impactions home your wax kit  Abdominal skin rash other cream and OTC steroid cream

## 2012-09-02 NOTE — Patient Instructions (Signed)
Use a combination of udder cream and steroid cream,,,,,,,,,, small amounts twice daily to the skin rash  Ear wax kit,,,,,,,,,,, 2 drops in each ear canal bedtime packed with cotton,,,,,,, remove the cotton in the morning,,,,,,,,,,, after 2 weeks of using the eardrops flush with warm water

## 2012-09-23 ENCOUNTER — Other Ambulatory Visit: Payer: Self-pay | Admitting: *Deleted

## 2012-09-23 MED ORDER — SIMVASTATIN 20 MG PO TABS: 20.0000 mg | ORAL_TABLET | Freq: Every day | ORAL | Status: DC

## 2012-10-13 ENCOUNTER — Telehealth: Payer: Self-pay | Admitting: Family Medicine

## 2012-10-13 NOTE — Telephone Encounter (Signed)
Noted  

## 2012-10-13 NOTE — Telephone Encounter (Signed)
Patient Information:  Caller Name: Peyton Najjar  Phone: [phone]  Patient: [name] Luna  Gender: Female  DOB: 17-May-1920  Age: 79 Years  PCP: Kelle [facility])  Office Follow Up:  Does the office need to follow up with this patient?: No  Instructions For The Office: N/A  RN Note:  Appt offered.  Caretaker states, Pt will have accident if Luna take her out of the house.  Pt/Caretaker would like to try home care before making appt. Pt is drinking and urinating normally, denies dizziness, blood or jet black stools.   Home care given per protocol, advised to call back if diarrhea worsen or didn't respond to home treatment.  Symptoms  Reason For Call & Symptoms: Diarrhea  Reviewed Health History In EMR: Yes  Reviewed Medications In EMR: Yes  Reviewed Allergies In EMR: Yes  Reviewed Surgeries / Procedures: Yes  Date of Onset of Symptoms: 10/06/2012  Treatments Tried: Pepto-bismol  Treatments Tried Worked: No  Guideline(s) Used:  Diarrhea  Disposition Per Guideline:   Go to Office Now  Reason For Disposition Reached:   Age > 60 years and has had > 6 diarrhea stools in past 24 hours  Advice Given:  Diarrhea Medication  - Imodium AD:   Helps reduce diarrhea.  Adult dosage: 4 mg (2 capsules or 4 teaspoons or 20 ml) is the recommended first dose. You may take an additional 2 mg (1 capsule or 2 teaspoons or 10 ml) after each loose BM.  Maximum dosage: 16 mg (8 capsules or 16 teaspoons or 80 ml).  1 capsule = 2 mg; 1 teaspoon (5 ml) = 1 mg.  Caution: Do not use if you have a fever greater than 100F (37.8C). Do not use if there is blood or mucus in your stools. Do not use for more than 2 days.  Read all package instructions.  Patient Refused Recommendation:  Patient Refused Appt, Patient Requests Appt At Later Date  Caretaker will try home care before making appt

## 2012-10-17 ENCOUNTER — Other Ambulatory Visit (INDEPENDENT_AMBULATORY_CARE_PROVIDER_SITE_OTHER): Payer: Medicare Other

## 2012-10-17 ENCOUNTER — Ambulatory Visit (INDEPENDENT_AMBULATORY_CARE_PROVIDER_SITE_OTHER): Payer: Medicare Other | Admitting: Internal Medicine

## 2012-10-17 ENCOUNTER — Encounter: Payer: Self-pay | Admitting: Internal Medicine

## 2012-10-17 VITALS — BP 172/88 | HR 73 | Temp 97.7°F

## 2012-10-17 DIAGNOSIS — R197 Diarrhea, unspecified: Secondary | ICD-10-CM

## 2012-10-17 LAB — COMPREHENSIVE METABOLIC PANEL
ALT: 19 U/L (ref 0–35)
AST: 25 U/L (ref 0–37)
Albumin: 3.7 g/dL (ref 3.5–5.2)
Calcium: 8.8 mg/dL (ref 8.4–10.5)
Chloride: 104 mEq/L (ref 96–112)
Potassium: 4 mEq/L (ref 3.5–5.1)

## 2012-10-17 LAB — CBC
MCHC: 32.2 g/dL (ref 30.0–36.0)
RDW: 17.7 % — ABNORMAL HIGH (ref 11.5–14.6)

## 2012-10-17 NOTE — Patient Instructions (Signed)
Diarrhea Diarrhea is watery poop (stool). It can make you feel weak, tired, thirsty, or give you a dry mouth (signs of dehydration). Watery poop is a sign of another problem, most often an infection. It often lasts 2 3 days. It can last longer if it is a sign of something serious. Take care of yourself as told by your doctor. HOME CARE   Drink 1 cup (8 ounces) of fluid each time you have watery poop.  Do not drink the following fluids:  Those that contain simple sugars (fructose, glucose, galactose, lactose, sucrose, maltose).  Sports drinks.  Fruit juices.  Whole milk products.  Sodas.  Drinks with caffeine (coffee, tea, soda) or alcohol.  Oral rehydration solution may be used if the doctor says it is okay. You may make your own solution. Follow this recipe:    teaspoon table salt.   teaspoon baking soda.   teaspoon salt substitute containing potassium chloride.  1 tablespoons sugar.  1 liter (34 ounces) of water.  Avoid the following foods:  High fiber foods, such as raw fruits and vegetables.  Nuts, seeds, and whole grain breads and cereals.   Those that are sweetened with sugar alcohols (xylitol, sorbitol, mannitol).  Try eating the following foods:  Starchy foods, such as rice, toast, pasta, low-sugar cereal, oatmeal, baked potatoes, crackers, and bagels.  Bananas.  Applesauce.  Eat probiotic-rich foods, such as yogurt and milk products that are fermented.  Wash your hands well after each time you have watery poop.  Only take medicine as told by your doctor.  Take a warm bath to help lessen burning or pain from having watery poop. GET HELP RIGHT AWAY IF:   You cannot drink fluids without throwing up (vomiting).  You keep throwing up.  You have blood in your poop, or your poop looks black and tarry.  You do not pee (urinate) in 6 8 hours, or there is only a small amount of very dark pee.  You have belly (abdominal) pain that gets worse or stays in  the same spot (localizes).  You are weak, dizzy, confused, or lightheaded.  You have a very bad headache.  Your watery poop gets worse or does not get better.  You have a fever or lasting symptoms for more than 2 3 days.  You have a fever and your symptoms suddenly get worse. MAKE SURE YOU:   Understand these instructions.  Will watch your condition.  Will get help right away if you are not doing well or get worse. Document Released: 09/05/2007 Document Revised: 12/12/2011 Document Reviewed: 11/25/2011 [employer] Patient Information 2014 [employer].  

## 2012-10-17 NOTE — Progress Notes (Signed)
Subjective:    Patient ID: [name] Luna, female    DOB: 05/13/1920, 79 y.o.   MRN: [id number]  HPI  Pt presents to the clinic today with c/o diarrhea. This started 1 week ago. She has been taking Lomotil but it has not seemed to help. She is having 4-5 loose stools per day. They do not have an odor. They have not changed color. She has not had a abx course in the last 6 weeks but she is on septra for UTI prophylaxis. She has had some associated abdominal cramping on which is relieved by having a BM. She denies blood in her stool. She has no history of diverticulitis that she is aware of. She has not eaten any small nuts, seeds or popcorn in the past 2 weeks.  Review of Systems      Past Medical History  Diagnosis Date  . CHF (congestive heart failure)   . A-fib   . Hypothyroid   . GERD (gastroesophageal reflux disease)   . Hypothyroidism   . Bradycardia   . Pancreatitis   . Coronary artery disease   . Shingles 1980's    "twice; once on my front side; once on my back" (02/07/2012)  . CORONARY ARTERY DISEASE 11/28/2006    Qualifier: Diagnosis of  By: Tawanna Cooler MD, Eugenio Hoes   . Pacemaker   . Paroxysmal a-fib, history of 08/02/2011  . Tremor, essential 08/02/2011  . Esophageal disorder, with history of dilatation 08/02/2011  . Angina   . H/O hiatal hernia   . Neuromuscular disorder     essential tremors  . UTI (lower urinary tract infection) 08/03/2011  . Dizziness, near syncope 11/07/2011  . Macular degeneration, dry     "both eyes" (02/07/2012)  . Hypertension   . HTN (hypertension), accelerated this admit 11/08/2011  . Pneumonia 1927    Current Outpatient Prescriptions  Medication Sig Dispense Refill  . amiodarone (PACERONE) 200 MG tablet Take 200 mg by mouth daily.        [name] aspirin 81 MG EC tablet Take 81 mg by mouth daily.        . calcium carbonate (TUMS - DOSED IN MG ELEMENTAL CALCIUM) 500 MG chewable tablet Chew 1 tablet by mouth daily.       . clopidogrel (PLAVIX) 75 MG tablet  Take 75 mg by mouth daily.        [name] levothyroxine (SYNTHROID, LEVOTHROID) 100 MCG tablet Take 100 mcg by mouth daily before breakfast.      . loratadine (CLARITIN) 10 MG tablet Take 10 mg by mouth daily.        [name] losartan (COZAAR) 50 MG tablet Take 50 mg by mouth 2 (two) times daily.      . metoprolol (LOPRESSOR) 50 MG tablet Take 50 mg by mouth at bedtime.      . midodrine (PROAMATINE) 2.5 MG tablet Take 2.5 mg by mouth daily before breakfast.      . Multiple Vitamin (MULTIVITAMIN) capsule Take 1 capsule by mouth daily.        . Multiple Vitamins-Minerals (PRESERVISION/LUTEIN) CAPS Take by mouth 2 (two) times daily.        . nitroGLYCERIN (NITROSTAT) 0.4 MG SL tablet Place 0.4 mg under the tongue every 5 (five) minutes as needed. For chest pain      . pantoprazole (PROTONIX) 40 MG tablet Take 40 mg by mouth 2 (two) times daily.      . polyethylene glycol (MIRALAX / GLYCOLAX) packet Take 17  g by mouth daily as needed. For constipation      . ranolazine (RANEXA) 500 MG 12 hr tablet Take 500 mg by mouth 2 (two) times daily.      . simvastatin (ZOCOR) 20 MG tablet Take 1 tablet (20 mg total) by mouth at bedtime.  90 tablet  3  . traMADol (ULTRAM) 50 MG tablet Take 25 mg by mouth at bedtime.      [name] trimethoprim (TRIMPEX) 100 MG tablet Take 100 mg by mouth at bedtime.       No current facility-administered medications for this visit.    Allergies  Allergen Reactions  . Codeine Other (See Comments)    Passes out  . Isosorbide Mononitrate Other (See Comments)    fainted  . Lorazepam Other (See Comments)    hallucinations  . Penicillins Rash  . Imdur (Isosorbide) Other (See Comments)    Passes out  . Prednisone Nausea And Vomiting  . Valium (Diazepam) Other (See Comments)    Reaction unknown    Family History  Problem Relation Age of Onset  . Sudden death Mother     History   Social History  . Marital Status: Married    Spouse Name: N/A    Number of Children: N/A  . Years of  Education: N/A   Occupational History  . Not on file.   Social History Main Topics  . Smoking status: Never Smoker   . Smokeless tobacco: Never Used  . Alcohol Use: No  . Drug Use: No  . Sexually Active: No   Other Topics Concern  . Not on file   Social History Narrative  . No narrative on file     Constitutional: Denies fever, malaise, fatigue, headache or abrupt weight changes.  Respiratory: Denies difficulty breathing, shortness of breath, cough or sputum production.   Cardiovascular: Denies chest pain, chest tightness, palpitations or swelling in the hands or feet.  Gastrointestinal: Pt reports diarrhea and abdominal cramping. Denies abdominal pain, bloating, constipation,or blood in the stool.  GU: Denies urgency, frequency, pain with urination, burning sensation, blood in urine, odor or discharge.  No other specific complaints in a complete review of systems (except as listed in HPI above).  Objective:   Physical Exam   BP 172/88  Pulse 73  Temp(Src) 97.7 F (36.5 C) (Oral)  SpO2 92% Wt Readings from Last 3 Encounters:  09/02/12 153 lb (69.4 kg)  07/08/12 150 lb (68.04 kg)  03/17/12 150 lb (68.04 kg)    General: Appears her stated age, well developed, well nourished in NAD. Cardiovascular: Normal rate and rhythm. S1,S2 noted.  No murmur, rubs or gallops noted. No JVD or BLE edema. No carotid bruits noted. Pulmonary/Chest: Normal effort and positive vesicular breath sounds. No respiratory distress. No wheezes, rales or ronchi noted.  Abdomen: Soft and generally tender. Normal bowel sounds, no bruits noted. No distention or masses noted. Liver, spleen and kidneys non palpable.   BMET    Component Value Date/Time   NA 138 05/02/2012 1336   K 3.8 05/02/2012 1336   CL 102 05/02/2012 1336   CO2 27 05/02/2012 1336   GLUCOSE 89 05/02/2012 1336   BUN 12 05/02/2012 1336   CREATININE 0.76 05/02/2012 1336   CALCIUM 9.0 05/02/2012 1336   GFRNONAA 73* 05/02/2012 1336    GFRAA 84* 05/02/2012 1336    Lipid Panel     Component Value Date/Time   CHOL 150 11/08/2011 0433   TRIG 65 11/08/2011 0433  HDL 61 11/08/2011 0433   CHOLHDL 2.5 11/08/2011 0433   VLDL 13 11/08/2011 0433   LDLCALC 76 11/08/2011 0433    CBC    Component Value Date/Time   WBC 5.0 05/02/2012 1336   RBC 3.91 05/02/2012 1336   HGB 10.7* 05/02/2012 1336   HCT 33.5* 05/02/2012 1336   PLT 253 05/02/2012 1336   MCV 85.7 05/02/2012 1336   MCH 27.4 05/02/2012 1336   MCHC 31.9 05/02/2012 1336   RDW 16.4* 05/02/2012 1336   LYMPHSABS 0.9 02/06/2012 1235   MONOABS 0.5 02/06/2012 1235   EOSABS 0.1 02/06/2012 1235   BASOSABS 0.0 02/06/2012 1235    Hgb A1C Lab Results  Component Value Date   HGBA1C 5.3 01/20/2012        Assessment & Plan:   Diarrhea x 1 week:  Will check CBC and CMET Will get C diff stool toxin Continue Imodium over the weekend and drink plenty of fluids ? Diverticulitis and wether or not to start cipro/flagyl versus CT scan  Will f/u after labs come back, if worse ER

## 2012-10-20 ENCOUNTER — Other Ambulatory Visit: Payer: Medicare Other

## 2012-10-20 DIAGNOSIS — R197 Diarrhea, unspecified: Secondary | ICD-10-CM

## 2012-10-21 LAB — CLOSTRIDIUM DIFFICILE EIA: CDIFTX: NEGATIVE

## 2012-11-14 LAB — PACEMAKER DEVICE OBSERVATION

## 2012-11-17 ENCOUNTER — Telehealth: Payer: Self-pay | Admitting: Cardiovascular Disease

## 2012-11-17 NOTE — Telephone Encounter (Signed)
Pt's son called in wondering if we received the fax for [name] Luna's Metoprolol.

## 2012-11-18 NOTE — Telephone Encounter (Signed)
Paper chart requested.

## 2012-11-18 NOTE — Telephone Encounter (Signed)
Returned call.  Left message that fax received and sent back yesterday.  Call back before 4pm if questions.

## 2013-01-02 ENCOUNTER — Telehealth: Payer: Self-pay | Admitting: Cardiovascular Disease

## 2013-01-02 MED ORDER — AMIODARONE HCL 200 MG PO TABS: 200.0000 mg | ORAL_TABLET | Freq: Every day | ORAL | Status: DC

## 2013-01-02 NOTE — Telephone Encounter (Signed)
Rx was sent to pharmacy electronically. 

## 2013-01-02 NOTE — Telephone Encounter (Signed)
Need refill on Amiodarone 200 mg #30

## 2013-01-06 ENCOUNTER — Encounter: Payer: Self-pay | Admitting: Cardiovascular Disease

## 2013-01-30 ENCOUNTER — Telehealth: Payer: Self-pay | Admitting: Cardiovascular Disease

## 2013-01-30 NOTE — Telephone Encounter (Signed)
Still have not been able to get her Protonix.Please call or fax today if possible please.

## 2013-02-02 MED ORDER — PANTOPRAZOLE SODIUM 40 MG PO TBEC: 40.0000 mg | DELAYED_RELEASE_TABLET | Freq: Two times a day (BID) | ORAL | Status: DC

## 2013-02-02 NOTE — Telephone Encounter (Signed)
Returned call and pt verified x 2 w/ pt's son, [name] Luna.  Informed message received and refill request not.  Son informed it may have been sent electronically and unable to receive electronic requests for Dr. Kandis Cocking Rxs.  Informed RN will send now and son set new cardiologist for Dr. Royann Shivers.  Refill(s) sent to pharmacy.  Informed RN will notify scheduling to set up Recall for December appt since son stated pt to return in 4 mos from August visit.  Son verbalized understanding and will have pharmacy fax hard copy requests for refills until all converted to new cardiologist.

## 2013-02-02 NOTE — Telephone Encounter (Signed)
Been calling since last Tuesday-still have not gotten her Protonix.

## 2013-02-02 NOTE — Telephone Encounter (Signed)
Scheduling was notified and recall entered per Inetta Fermo.

## 2013-02-13 ENCOUNTER — Emergency Department (HOSPITAL_COMMUNITY): Payer: Medicare Other

## 2013-02-13 ENCOUNTER — Observation Stay (HOSPITAL_COMMUNITY): Payer: Medicare Other

## 2013-02-13 ENCOUNTER — Encounter (HOSPITAL_COMMUNITY): Payer: Self-pay | Admitting: Emergency Medicine

## 2013-02-13 ENCOUNTER — Observation Stay (HOSPITAL_COMMUNITY)
Admission: EM | Admit: 2013-02-13 | Discharge: 2013-02-14 | Disposition: A | Discharge status: Home / Self Care | Payer: Medicare Other | Attending: Internal Medicine | Admitting: Internal Medicine

## 2013-02-13 DIAGNOSIS — N390 Urinary tract infection, site not specified: Secondary | ICD-10-CM

## 2013-02-13 DIAGNOSIS — Z7982 Long term (current) use of aspirin: Secondary | ICD-10-CM | POA: Insufficient documentation

## 2013-02-13 DIAGNOSIS — F329 Major depressive disorder, single episode, unspecified: Secondary | ICD-10-CM

## 2013-02-13 DIAGNOSIS — R55 Syncope and collapse: Principal | ICD-10-CM

## 2013-02-13 DIAGNOSIS — K229 Disease of esophagus, unspecified: Secondary | ICD-10-CM

## 2013-02-13 DIAGNOSIS — K5900 Constipation, unspecified: Secondary | ICD-10-CM

## 2013-02-13 DIAGNOSIS — Z79899 Other long term (current) drug therapy: Secondary | ICD-10-CM | POA: Insufficient documentation

## 2013-02-13 DIAGNOSIS — L259 Unspecified contact dermatitis, unspecified cause: Secondary | ICD-10-CM

## 2013-02-13 DIAGNOSIS — I480 Paroxysmal atrial fibrillation: Secondary | ICD-10-CM

## 2013-02-13 DIAGNOSIS — H6120 Impacted cerumen, unspecified ear: Secondary | ICD-10-CM

## 2013-02-13 DIAGNOSIS — D649 Anemia, unspecified: Secondary | ICD-10-CM

## 2013-02-13 DIAGNOSIS — I4891 Unspecified atrial fibrillation: Secondary | ICD-10-CM | POA: Insufficient documentation

## 2013-02-13 DIAGNOSIS — G250 Essential tremor: Secondary | ICD-10-CM

## 2013-02-13 DIAGNOSIS — R42 Dizziness and giddiness: Secondary | ICD-10-CM

## 2013-02-13 DIAGNOSIS — I509 Heart failure, unspecified: Secondary | ICD-10-CM | POA: Insufficient documentation

## 2013-02-13 DIAGNOSIS — F0393 Unspecified dementia, unspecified severity, with mood disturbance: Secondary | ICD-10-CM

## 2013-02-13 DIAGNOSIS — K219 Gastro-esophageal reflux disease without esophagitis: Secondary | ICD-10-CM

## 2013-02-13 DIAGNOSIS — Z7902 Long term (current) use of antithrombotics/antiplatelets: Secondary | ICD-10-CM | POA: Insufficient documentation

## 2013-02-13 DIAGNOSIS — I5189 Other ill-defined heart diseases: Secondary | ICD-10-CM

## 2013-02-13 DIAGNOSIS — Z950 Presence of cardiac pacemaker: Secondary | ICD-10-CM

## 2013-02-13 DIAGNOSIS — I10 Essential (primary) hypertension: Secondary | ICD-10-CM

## 2013-02-13 DIAGNOSIS — R112 Nausea with vomiting, unspecified: Secondary | ICD-10-CM | POA: Insufficient documentation

## 2013-02-13 DIAGNOSIS — I959 Hypotension, unspecified: Secondary | ICD-10-CM

## 2013-02-13 DIAGNOSIS — E039 Hypothyroidism, unspecified: Secondary | ICD-10-CM

## 2013-02-13 DIAGNOSIS — I2510 Atherosclerotic heart disease of native coronary artery without angina pectoris: Secondary | ICD-10-CM

## 2013-02-13 LAB — COMPREHENSIVE METABOLIC PANEL
ALT: 18 U/L (ref 0–35)
AST: 26 U/L (ref 0–37)
Albumin: 3.5 g/dL (ref 3.5–5.2)
Alkaline Phosphatase: 162 U/L — ABNORMAL HIGH (ref 39–117)
CO2: 28 mEq/L (ref 19–32)
Calcium: 8.9 mg/dL (ref 8.4–10.5)
GFR calc non Af Amer: 58 mL/min — ABNORMAL LOW (ref >90)
Sodium: 140 mEq/L (ref 135–145)

## 2013-02-13 LAB — PROTIME-INR: INR: 0.94 (ref 0.00–1.49)

## 2013-02-13 LAB — CBC
MCH: 26.9 pg (ref 26.0–34.0)
Platelets: 231 10*3/uL (ref 150–400)
RBC: 4.2 MIL/uL (ref 3.87–5.11)
RDW: 16.7 % — ABNORMAL HIGH (ref 11.5–15.5)
WBC: 7.3 10*3/uL (ref 4.0–10.5)

## 2013-02-13 LAB — TROPONIN I
Troponin I: <0.3 ng/mL (ref <0.30)
Troponin I: <0.3 ng/mL (ref <0.30)

## 2013-02-13 LAB — PRO B NATRIURETIC PEPTIDE: Pro B Natriuretic peptide (BNP): 507.6 pg/mL — ABNORMAL HIGH (ref 0–450)

## 2013-02-13 LAB — GLUCOSE, CAPILLARY: Glucose-Capillary: 97 mg/dL (ref 70–99)

## 2013-02-13 MED ORDER — TRAMADOL HCL 50 MG PO TABS
25.0000 mg | ORAL_TABLET | Freq: Every day | ORAL | Status: DC
  Administered 2013-02-13: 25 mg via ORAL
  Filled 2013-02-13: qty 1

## 2013-02-13 MED ORDER — ACETAMINOPHEN 650 MG RE SUPP: 650.0000 mg | Freq: Four times a day (QID) | RECTAL | Status: DC | PRN

## 2013-02-13 MED ORDER — ENOXAPARIN SODIUM 40 MG/0.4ML ~~LOC~~ SOLN
40.0000 mg | SUBCUTANEOUS | Status: DC
  Administered 2013-02-13: 40 mg via SUBCUTANEOUS
  Filled 2013-02-13 (×2): qty 0.4

## 2013-02-13 MED ORDER — ONDANSETRON HCL 4 MG PO TABS: 4.0000 mg | ORAL_TABLET | Freq: Four times a day (QID) | ORAL | Status: DC | PRN

## 2013-02-13 MED ORDER — SENNA 8.6 MG PO TABS
2.0000 | ORAL_TABLET | Freq: Every day | ORAL | Status: DC
  Administered 2013-02-14: 17.2 mg via ORAL
  Filled 2013-02-13 (×2): qty 2

## 2013-02-13 MED ORDER — TRIMETHOPRIM 100 MG PO TABS
100.0000 mg | ORAL_TABLET | Freq: Every day | ORAL | Status: DC
  Administered 2013-02-13: 22:00:00 100 mg via ORAL
  Filled 2013-02-13 (×2): qty 1

## 2013-02-13 MED ORDER — POLYETHYLENE GLYCOL 3350 17 G PO PACK
17.0000 g | PACK | Freq: Every day | ORAL | Status: DC
  Filled 2013-02-13 (×2): qty 1

## 2013-02-13 MED ORDER — LEVOTHYROXINE SODIUM 100 MCG PO TABS
100.0000 ug | ORAL_TABLET | Freq: Every day | ORAL | Status: DC
  Administered 2013-02-14: 100 ug via ORAL
  Filled 2013-02-13 (×2): qty 1

## 2013-02-13 MED ORDER — PANTOPRAZOLE SODIUM 40 MG PO TBEC
40.0000 mg | DELAYED_RELEASE_TABLET | Freq: Two times a day (BID) | ORAL | Status: DC
  Administered 2013-02-13: 40 mg via ORAL
  Filled 2013-02-13: qty 1

## 2013-02-13 MED ORDER — SODIUM CHLORIDE 0.9 % IJ SOLN: 3.0000 mL | INTRAMUSCULAR | Status: DC | PRN

## 2013-02-13 MED ORDER — SIMVASTATIN 20 MG PO TABS
20.0000 mg | ORAL_TABLET | Freq: Every day | ORAL | Status: DC
  Administered 2013-02-13: 22:00:00 20 mg via ORAL
  Filled 2013-02-13 (×2): qty 1

## 2013-02-13 MED ORDER — ONDANSETRON HCL 4 MG/2ML IJ SOLN
4.0000 mg | Freq: Once | INTRAMUSCULAR | Status: AC
  Administered 2013-02-13: 4 mg via INTRAVENOUS
  Filled 2013-02-13: qty 2

## 2013-02-13 MED ORDER — ACETAMINOPHEN 325 MG PO TABS: 650.0000 mg | ORAL_TABLET | Freq: Four times a day (QID) | ORAL | Status: DC | PRN

## 2013-02-13 MED ORDER — MIDODRINE HCL 2.5 MG PO TABS
2.5000 mg | ORAL_TABLET | Freq: Every day | ORAL | Status: DC
  Administered 2013-02-14: 2.5 mg via ORAL
  Filled 2013-02-13 (×2): qty 1

## 2013-02-13 MED ORDER — RANOLAZINE ER 500 MG PO TB12
500.0000 mg | ORAL_TABLET | Freq: Every day | ORAL | Status: DC
  Administered 2013-02-14: 500 mg via ORAL
  Filled 2013-02-13: qty 1

## 2013-02-13 MED ORDER — SODIUM CHLORIDE 0.9 % IJ SOLN: 3.0000 mL | Freq: Two times a day (BID) | INTRAMUSCULAR | Status: DC

## 2013-02-13 MED ORDER — SODIUM CHLORIDE 0.9 % IV SOLN
INTRAVENOUS | Status: AC
  Administered 2013-02-13: 19:00:00 via INTRAVENOUS

## 2013-02-13 MED ORDER — METOPROLOL TARTRATE 50 MG PO TABS
50.0000 mg | ORAL_TABLET | Freq: Every day | ORAL | Status: DC
  Administered 2013-02-13: 22:00:00 50 mg via ORAL
  Filled 2013-02-13 (×2): qty 1

## 2013-02-13 MED ORDER — LORATADINE 10 MG PO TABS
10.0000 mg | ORAL_TABLET | Freq: Every day | ORAL | Status: DC
  Administered 2013-02-14: 13:00:00 10 mg via ORAL
  Filled 2013-02-13: qty 1

## 2013-02-13 MED ORDER — NITROGLYCERIN 0.4 MG SL SUBL
0.4000 mg | SUBLINGUAL_TABLET | SUBLINGUAL | Status: DC | PRN
Start: 2013-02-13 — End: 2013-02-14

## 2013-02-13 MED ORDER — ALUM & MAG HYDROXIDE-SIMETH 200-200-20 MG/5ML PO SUSP: 30.0000 mL | Freq: Four times a day (QID) | ORAL | Status: DC | PRN

## 2013-02-13 MED ORDER — ASPIRIN EC 81 MG PO TBEC
81.0000 mg | DELAYED_RELEASE_TABLET | Freq: Every day | ORAL | Status: DC
  Administered 2013-02-14: 81 mg via ORAL
  Filled 2013-02-13: qty 1

## 2013-02-13 MED ORDER — ONDANSETRON HCL 4 MG/2ML IJ SOLN
4.0000 mg | Freq: Four times a day (QID) | INTRAMUSCULAR | Status: DC | PRN
  Administered 2013-02-13 – 2013-02-14 (×2): 4 mg via INTRAVENOUS
  Filled 2013-02-13 (×2): qty 2

## 2013-02-13 MED ORDER — AMIODARONE HCL 200 MG PO TABS
200.0000 mg | ORAL_TABLET | Freq: Every day | ORAL | Status: DC
  Administered 2013-02-14: 200 mg via ORAL
  Filled 2013-02-13: qty 1

## 2013-02-13 MED ORDER — LOSARTAN POTASSIUM 50 MG PO TABS
50.0000 mg | ORAL_TABLET | Freq: Two times a day (BID) | ORAL | Status: DC
  Administered 2013-02-13 – 2013-02-14 (×2): 50 mg via ORAL
  Filled 2013-02-13 (×3): qty 1

## 2013-02-13 MED ORDER — CLOPIDOGREL BISULFATE 75 MG PO TABS
75.0000 mg | ORAL_TABLET | Freq: Every day | ORAL | Status: DC
  Administered 2013-02-14: 13:00:00 75 mg via ORAL
  Filled 2013-02-13: qty 1

## 2013-02-13 MED ORDER — ALBUTEROL SULFATE (5 MG/ML) 0.5% IN NEBU: 2.5000 mg | INHALATION_SOLUTION | RESPIRATORY_TRACT | Status: DC | PRN

## 2013-02-13 NOTE — ED Provider Notes (Addendum)
CSN: [id number]     Arrival date & time 02/13/13  1331 History   First MD Initiated Contact with Patient 02/13/13 1336     Chief Complaint  Patient presents with  . Loss of Consciousness   (Consider location/radiation/quality/duration/timing/severity/associated sxs/prior Treatment) HPI Comments: 79 yo wf with MMP presents via EMS for syncope.  Event was witnessed by pt's son.  She was walking to bedside commode and became dizzy.  Her son helped her to floor.  No CHI, no trauma.  Pt did have one episode of vomiting.    Patient is a 79 y.o. female presenting with syncope. The history is provided by the patient, the EMS personnel and a relative.  Loss of Consciousness Episode history:  Single Most recent episode:  Today Timing:  Rare Chronicity:  New Context: standing up   Context: not blood draw, not bowel movement, not dehydration, not exertion, not inactivity, not medication change, not with normal activity, not sight of blood, not sitting down and not urination   Witnessed: yes   Relieved by:  Nothing Worsened by:  Nothing tried Ineffective treatments:  None tried Associated symptoms: confusion, malaise/fatigue, nausea, vomiting and weakness   Associated symptoms: no anxiety, no chest pain, no diaphoresis, no difficulty breathing, no dizziness, no fever, no focal sensory loss, no focal weakness, no headaches, no palpitations, no recent fall, no recent injury, no recent surgery, no seizures, no shortness of breath and no visual change     Past Medical History  Diagnosis Date  . CHF (congestive heart failure)   . A-fib   . Hypothyroid   . GERD (gastroesophageal reflux disease)   . Hypothyroidism   . Bradycardia   . Pancreatitis   . Coronary artery disease   . Shingles 1980's    "twice; once on my front side; once on my back" (02/07/2012)  . CORONARY ARTERY DISEASE 11/28/2006    Qualifier: Diagnosis of  By: Tawanna Cooler MD, Eugenio Hoes   . Pacemaker   . Paroxysmal a-fib, history of  08/02/2011  . Tremor, essential 08/02/2011  . Esophageal disorder, with history of dilatation 08/02/2011  . Angina   . H/O hiatal hernia   . Neuromuscular disorder     essential tremors  . UTI (lower urinary tract infection) 08/03/2011  . Dizziness, near syncope 11/07/2011  . Macular degeneration, dry     "both eyes" (02/07/2012)  . Hypertension   . HTN (hypertension), accelerated this admit 11/08/2011  . Pneumonia 1927   Past Surgical History  Procedure Laterality Date  . Repair spigelian hernia    . S/p childbirth      x2  . Cataract extraction w/ intraocular lens  implant, bilateral  ~ 1997  . Coronary angioplasty with stent placement  2008  . Vaginal hysterectomy  1970's  . Left oophorectomy  1970's  . Insert / replace / remove pacemaker  2000; 2009    initial placement; replaced  . Appendectomy  1933  . Cholecystectomy  1950's  . Hernia repair    . Esophageal dilation      "once" (02/07/2012)   Family History  Problem Relation Age of Onset  . Sudden death Mother    History  Substance Use Topics  . Smoking status: Never Smoker   . Smokeless tobacco: Never Used  . Alcohol Use: No   OB History   Grav Para Term Preterm Abortions TAB SAB Ect Mult Living  Review of Systems  Constitutional: Positive for malaise/fatigue. Negative for fever, chills, diaphoresis and fatigue.  HENT: Negative.   Respiratory: Negative.  Negative for apnea, cough, choking, chest tightness and shortness of breath.   Cardiovascular: Positive for syncope. Negative for chest pain and palpitations.       Syncope  Gastrointestinal: Positive for nausea, vomiting and constipation. Negative for diarrhea and blood in stool.  Endocrine: Negative.   Genitourinary: Negative.   Neurological: Positive for weakness. Negative for dizziness, focal weakness, seizures and headaches.  Psychiatric/Behavioral: Positive for confusion.    Allergies  Codeine; Isosorbide mononitrate; Lorazepam; Penicillins;  Imdur; Prednisone; and Valium  Home Medications   Current Outpatient Rx  Name  Route  Sig  Dispense  Refill  . amiodarone (PACERONE) 200 MG tablet   Oral   Take 1 tablet (200 mg total) by mouth daily.   30 tablet   11   . aspirin 81 MG EC tablet   Oral   Take 81 mg by mouth daily.           . clopidogrel (PLAVIX) 75 MG tablet   Oral   Take 75 mg by mouth daily.           [name] levothyroxine (SYNTHROID, LEVOTHROID) 100 MCG tablet   Oral   Take 100 mcg by mouth daily before breakfast.         . loperamide (IMODIUM) 2 MG capsule   Oral   Take 2 mg by mouth 4 (four) times daily as needed for diarrhea or loose stools.         [name] loratadine (CLARITIN) 10 MG tablet   Oral   Take 10 mg by mouth daily.           [name] losartan (COZAAR) 50 MG tablet   Oral   Take 50 mg by mouth 2 (two) times daily.         . metoprolol (LOPRESSOR) 50 MG tablet   Oral   Take 50 mg by mouth at bedtime.         . midodrine (PROAMATINE) 2.5 MG tablet   Oral   Take 2.5 mg by mouth daily before breakfast.         . Multiple Vitamins-Minerals (PRESERVISION/LUTEIN) CAPS   Oral   Take by mouth 2 (two) times daily.           . pantoprazole (PROTONIX) 40 MG tablet   Oral   Take 1 tablet (40 mg total) by mouth 2 (two) times daily.   60 tablet   1   . polyethylene glycol (MIRALAX / GLYCOLAX) packet   Oral   Take 17 g by mouth daily as needed. For constipation         . ranolazine (RANEXA) 500 MG 12 hr tablet   Oral   Take 500 mg by mouth daily.          . simvastatin (ZOCOR) 20 MG tablet   Oral   Take 1 tablet (20 mg total) by mouth at bedtime.   90 tablet   3   . traMADol (ULTRAM) 50 MG tablet   Oral   Take 25 mg by mouth at bedtime.         [name] trimethoprim (TRIMPEX) 100 MG tablet   Oral   Take 100 mg by mouth at bedtime.         . nitroGLYCERIN (NITROSTAT) 0.4 MG SL tablet   Sublingual   Place 0.4 mg under the tongue every  5 (five) minutes as needed. For chest  pain          BP 175/66  Pulse 71  Temp(Src) 98.2 F (36.8 C) (Oral)  Resp 20  SpO2 95% Physical Exam  Nursing note and vitals reviewed. Constitutional: She is oriented to person, place, and time. She appears well-developed and well-nourished. No distress.  HENT:  Head: Normocephalic and atraumatic.  Mouth/Throat: No oropharyngeal exudate.  Eyes: Conjunctivae and EOM are normal. Pupils are equal, round, and reactive to light.  Neck: Normal range of motion. Neck supple. No JVD present.  Cardiovascular: Normal rate and regular rhythm.   Pulmonary/Chest: Effort normal and breath sounds normal. No respiratory distress. She has no wheezes. She has no rales. She exhibits no tenderness.  Abdominal: Bowel sounds are normal. She exhibits no distension and no mass. There is no tenderness. There is no rebound and no guarding.  Musculoskeletal: Normal range of motion. She exhibits no edema and no tenderness.  Neurological: She is alert and oriented to person, place, and time. She displays no atrophy and no tremor. No cranial nerve deficit or sensory deficit. She exhibits normal muscle tone. She displays no seizure activity. GCS eye subscore is 4. GCS verbal subscore is 5. GCS motor subscore is 6.  Normal speech, no slurring Neg pronator drift, neg facial droop, MAE, able to SLR  Skin: She is not diaphoretic.    ED Course  Procedures (including critical care time) Labs Review Labs Reviewed  CBC - Abnormal; Notable for the following:    Hemoglobin 11.3 (*)    HCT 35.2 (*)    RDW 16.7 (*)    All other components within normal limits  COMPREHENSIVE METABOLIC PANEL - Abnormal; Notable for the following:    Glucose, Bld 104 (*)    Alkaline Phosphatase 162 (*)    GFR calc non Af Amer 58 (*)    GFR calc Af Amer 67 (*)    All other components within normal limits  PRO B NATRIURETIC PEPTIDE - Abnormal; Notable for the following:    Pro B Natriuretic peptide (BNP) 507.6 (*)    All other  components within normal limits  PROTIME-INR  TROPONIN I  GLUCOSE, CAPILLARY  URINALYSIS, ROUTINE W REFLEX MICROSCOPIC   Imaging Review Dg Chest 1 View  02/13/2013   CLINICAL DATA:  Loss of consciousness.  Shortness of breath.  EXAM: CHEST - 1 VIEW  COMPARISON:  05/02/2012  FINDINGS: The heart is enlarged. Patient has right-sided transvenous pacemaker with leads to the right atrium and right ventricle. There are no focal consolidations or pleural effusions. Minimal density at the right lung base is felt to be chronic. There is no pulmonary edema.  IMPRESSION: 1. Cardiomegaly without pulmonary edema. 2.  No focal pulmonary abnormality.   Electronically Signed   By: Rosalie Gums M.D.   On: 02/13/2013 14:22   Ct Head Wo Contrast (only If Suspected Head Trauma And/or Pt Is On Anticoagulant)  02/13/2013   CLINICAL DATA:  Loss of consciousness, suspected head trauma  EXAM: CT HEAD WITHOUT CONTRAST  TECHNIQUE: Contiguous axial images were obtained from the base of the skull through the vertex without contrast.  COMPARISON:  05/02/2012  FINDINGS: Stable brain atrophy and extensive white matter chronic microvascular ischemic change throughout the cerebrum. No acute intracranial hemorrhage, mass lesion, definite infarction, focal edema or mass effect. No hydrocephalus, herniation, or extra-axial fluid collection. Cisterns are patent. Cerebellar atrophy as well. Atherosclerosis of the intracranial vessels. Mastoids and sinuses clear.  IMPRESSION: Stable atrophy and advanced white matter microvascular ischemic change. No interval change or acute process by noncontrast CT   Electronically Signed   By: Ruel Favors M.D.   On: 02/13/2013 14:47    EKG Interpretation     Ventricular Rate:  73 PR Interval:  208 QRS Duration: 103 QT Interval:  428 QTC Calculation: 472 R Axis:   25 Text Interpretation:  Atrial-paced rhythm            MDM   1. Syncope    79 year old white female presents emergency  department for syncopal episode. ER workup for syncope was initiated. Abnormal findings include BNP of 507, H&H of 11 and 35 however these IVs are higher than her baseline. CMP with alkaline phosphatase of 162 but otherwise unremarkable. CT head is negative. Chest x-ray without acute findings. Vital signs stable during ER stay. Case discussed with internal medicine and plan for admit for observation status. Patient and her son have been updated.  Pt t/o to Dr. Radford Pax @ 1600 pending admission.  Darlys Gales, MD 02/13/13 1617  Darlys Gales, MD 02/13/13 [phone]

## 2013-02-13 NOTE — ED Notes (Signed)
Pt states she remembers feeling dizzy prior to passing out. Pt also states she was feeling constipated so she took some milk of magnesia about an hour prior to episode. Pt denies any pain, pt does not think she hit her head when she fell. Pt just remembers not feeling right prior to syncopal episode.

## 2013-02-13 NOTE — ED Notes (Signed)
Per EMS pt had a witnessed syncopal episode. Pt stood up to go to bathroom and passed out. Pt vomited shortly after passing out. Pt was A&O upon ems arrival. Pt was given 4mg  zofan en route.

## 2013-02-13 NOTE — H&P (Signed)
[facility]  History and Physical  [name] Luna:[id number] DOB: 12/24/1920 DOA: 02/13/2013  Referring physician: EDP PCP: Evette Georges, MD  Outpatient Specialists:  1. Cardiology: Dr. Thurmon Fair  Chief Complaint: Passed out.  HPI: [name] Luna is a 79 y.o. female with extensive past medical history-coronary artery disease, PAF, bradycardia, PPM, hypothyroid, GERD, essential tremor, UTI, syncope related to orthostatic hypotension, hypertension, presented to the ED with an episode of passing out. History is mostly provided by patient son who is at the bedside. Patient lives with the son. She is ambulant with a walker at times but mostly with the help of a wheelchair. She requires assistance for most activities of daily living. She has apparently been doing reasonably well over the last 1 year. Prior to that she had episodes of what appear to be orthostatic hypotension and syncope. This morning, patient complained of constipation and had not had a BM for approximately a week. She was sitting at her kitchen table. Her son provided her some milk of magnesia. Approximately 45 minutes later, she started complaining of dizziness and feeling like she was given a pass out. He wheeled her to her bedroom and the bedside commode next to her bed. While attempting to get out of the wheelchair and sit on the commode, she suddenly became weak, and was slumping down. He was able to hold her and slight her back into the wheelchair and she apparently became nearly or fully unresponsive for 3-4 minutes. There was no headache, palpitations or strokelike symptoms. We called EMS. Patient then spontaneously begin consciousness but felt weak and vomited x1 without blood or coffee grounds. In the ED, CT head negative, hemoglobin 11.3, troponin negative, probe BNP 507, chest x-ray without acute findings. Patient continues to feel slightly dizzy on sitting up. Her appetite has been good. Hospitalist  admission requested.   Review of Systems: All systems reviewed and apart from history of presenting illness, are negative. Patient does complain of some heartburns at night.  Past Medical History  Diagnosis Date  . CHF (congestive heart failure)   . A-fib   . Hypothyroid   . GERD (gastroesophageal reflux disease)   . Hypothyroidism   . Bradycardia   . Pancreatitis   . Coronary artery disease   . Shingles 1980's    "twice; once on my front side; once on my back" (02/07/2012)  . CORONARY ARTERY DISEASE 11/28/2006    Qualifier: Diagnosis of  By: Tawanna Cooler MD, Eugenio Hoes   . Pacemaker   . Paroxysmal a-fib, history of 08/02/2011  . Tremor, essential 08/02/2011  . Esophageal disorder, with history of dilatation 08/02/2011  . Angina   . H/O hiatal hernia   . Neuromuscular disorder     essential tremors  . UTI (lower urinary tract infection) 08/03/2011  . Dizziness, near syncope 11/07/2011  . Macular degeneration, dry     "both eyes" (02/07/2012)  . Hypertension   . HTN (hypertension), accelerated this admit 11/08/2011  . Pneumonia 1927   Past Surgical History  Procedure Laterality Date  . Repair spigelian hernia    . S/p childbirth      x2  . Cataract extraction w/ intraocular lens  implant, bilateral  ~ 1997  . Coronary angioplasty with stent placement  2008  . Vaginal hysterectomy  1970's  . Left oophorectomy  1970's  . Insert / replace / remove pacemaker  2000; 2009    initial placement; replaced  . Appendectomy  1933  . Cholecystectomy  1950's  . Hernia repair    . Esophageal dilation      "once" (02/07/2012)   Social History:  reports that she has never smoked. She has never used smokeless tobacco. She reports that she does not drink alcohol or use illicit drugs. Widowed. Lives with son and is dependent on activities of daily living.  Allergies  Allergen Reactions  . Codeine Other (See Comments)    Passes out  . Isosorbide Mononitrate Other (See Comments)    fainted  . Lorazepam  Other (See Comments)    hallucinations  . Penicillins Rash  . Imdur [Isosorbide] Other (See Comments)    Passes out  . Prednisone Nausea And Vomiting  . Valium [Diazepam] Other (See Comments)    Reaction unknown    Family History  Problem Relation Age of Onset  . Sudden death Mother     Prior to Admission medications   Medication Sig Start Date End Date Taking? Authorizing Provider  amiodarone (PACERONE) 200 MG tablet Take 1 tablet (200 mg total) by mouth daily. 01/02/13  Yes Runell Gess, MD  aspirin 81 MG EC tablet Take 81 mg by mouth daily.     Yes Historical Provider, MD  clopidogrel (PLAVIX) 75 MG tablet Take 75 mg by mouth daily.     Yes Historical Provider, MD  levothyroxine (SYNTHROID, LEVOTHROID) 100 MCG tablet Take 100 mcg by mouth daily before breakfast.   Yes Historical Provider, MD  loperamide (IMODIUM) 2 MG capsule Take 2 mg by mouth 4 (four) times daily as needed for diarrhea or loose stools.   Yes Historical Provider, MD  loratadine (CLARITIN) 10 MG tablet Take 10 mg by mouth daily.     Yes Historical Provider, MD  losartan (COZAAR) 50 MG tablet Take 50 mg by mouth 2 (two) times daily. 02/15/12  Yes Wilburt Finlay, PA-C  metoprolol (LOPRESSOR) 50 MG tablet Take 50 mg by mouth at bedtime. 02/15/12  Yes Wilburt Finlay, PA-C  midodrine (PROAMATINE) 2.5 MG tablet Take 2.5 mg by mouth daily before breakfast. 02/15/12  Yes Wilburt Finlay, PA-C  Multiple Vitamins-Minerals (PRESERVISION/LUTEIN) CAPS Take by mouth 2 (two) times daily.     Yes Historical Provider, MD  pantoprazole (PROTONIX) 40 MG tablet Take 1 tablet (40 mg total) by mouth 2 (two) times daily. 02/02/13  Yes Mihai Croitoru, MD  polyethylene glycol (MIRALAX / GLYCOLAX) packet Take 17 g by mouth daily as needed. For constipation 01/26/12  Yes Ripudeep Jenna Luo, MD  ranolazine (RANEXA) 500 MG 12 hr tablet Take 500 mg by mouth daily.  01/26/12  Yes Ripudeep Jenna Luo, MD  simvastatin (ZOCOR) 20 MG tablet Take 1 tablet (20 mg total)  by mouth at bedtime. 09/23/12  Yes Roderick Pee, MD  traMADol (ULTRAM) 50 MG tablet Take 25 mg by mouth at bedtime. 02/15/12  Yes Wilburt Finlay, PA-C  trimethoprim (TRIMPEX) 100 MG tablet Take 100 mg by mouth at bedtime.   Yes Historical Provider, MD  nitroGLYCERIN (NITROSTAT) 0.4 MG SL tablet Place 0.4 mg under the tongue every 5 (five) minutes as needed. For chest pain    Historical Provider, MD   Physical Exam: Filed Vitals:   02/13/13 1338 02/13/13 1339 02/13/13 1340 02/13/13 1449  BP: 193/68 184/68 182/62 175/66  Pulse: 71 76 78 71  Temp:      TempSrc:      Resp:    20  SpO2:    95%     General exam: Moderately built and nourished elderly  female patient, lying comfortably supine on the gurney in no obvious distress.  Head, eyes and ENT: Nontraumatic and normocephalic. Pupils equally reacting to light and accommodation. Oral mucosa moist.  Neck: Supple. No JVD, carotid bruit or thyromegaly.  Lymphatics: No lymphadenopathy.  Respiratory system: Occasional basal crackles but otherwise clear to auscultation. No increased work of breathing. Pacemaker box right upper anterior chest.  Cardiovascular system: S1 and S2 heard, RRR. No JVD, murmurs, gallops, clicks or pedal edema. Telemetry: Atrial paced rhythm.  Gastrointestinal system: Abdomen is nondistended, soft and nontender. Normal bowel sounds heard. No organomegaly or masses appreciated.  Central nervous system: Alert and oriented. No focal neurological deficits.  Extremities: Symmetric 5 x 5 power. Peripheral pulses symmetrically felt. Patient has knee high compression stockings bilaterally.   Skin: No rashes or acute findings.  Musculoskeletal system: Negative exam.  Psychiatry: Pleasant and cooperative.   Labs on Admission:  Basic Metabolic Panel:  Recent Labs Lab 02/13/13 1350  NA 140  K 3.7  CL 102  CO2 28  GLUCOSE 104*  BUN 15  CREATININE 0.86  CALCIUM 8.9   Liver Function Tests:  Recent Labs Lab  02/13/13 1350  AST 26  ALT 18  ALKPHOS 162*  BILITOT 0.4  PROT 7.0  ALBUMIN 3.5   No results found for this basename: LIPASE, AMYLASE,  in the last 168 hours No results found for this basename: AMMONIA,  in the last 168 hours CBC:  Recent Labs Lab 02/13/13 1350  WBC 7.3  HGB 11.3*  HCT 35.2*  MCV 83.8  PLT 231   Cardiac Enzymes:  Recent Labs Lab 02/13/13 1351  TROPONINI <0.30    BNP (last 3 results)  Recent Labs  02/13/13 1351  PROBNP 507.6*   CBG:  Recent Labs Lab 02/13/13 1354  GLUCAP 97    Radiological Exams on Admission: Dg Chest 1 View  02/13/2013   CLINICAL DATA:  Loss of consciousness.  Shortness of breath.  EXAM: CHEST - 1 VIEW  COMPARISON:  05/02/2012  FINDINGS: The heart is enlarged. Patient has right-sided transvenous pacemaker with leads to the right atrium and right ventricle. There are no focal consolidations or pleural effusions. Minimal density at the right lung base is felt to be chronic. There is no pulmonary edema.  IMPRESSION: 1. Cardiomegaly without pulmonary edema. 2.  No focal pulmonary abnormality.   Electronically Signed   By: Rosalie Gums M.D.   On: 02/13/2013 14:22   Ct Head Wo Contrast (only If Suspected Head Trauma And/or Pt Is On Anticoagulant)  02/13/2013   CLINICAL DATA:  Loss of consciousness, suspected head trauma  EXAM: CT HEAD WITHOUT CONTRAST  TECHNIQUE: Contiguous axial images were obtained from the base of the skull through the vertex without contrast.  COMPARISON:  05/02/2012  FINDINGS: Stable brain atrophy and extensive white matter chronic microvascular ischemic change throughout the cerebrum. No acute intracranial hemorrhage, mass lesion, definite infarction, focal edema or mass effect. No hydrocephalus, herniation, or extra-axial fluid collection. Cisterns are patent. Cerebellar atrophy as well. Atherosclerosis of the intracranial vessels. Mastoids and sinuses clear.  IMPRESSION: Stable atrophy and advanced white matter  microvascular ischemic change. No interval change or acute process by noncontrast CT   Electronically Signed   By: Ruel Favors M.D.   On: 02/13/2013 14:47    EKG: Independently reviewed. Atrial paced rhythm.  Assessment/Plan Active Problems:   HYPOTHYROIDISM   HYPERTENSION   GERD   Anemia   H/O cardiac pacemaker, Medtronic versa implanted 01/2008,  though original implanted 2000 for syncope and heart block and ventricular asystole.   Paroxysmal a-fib, history of, off coumadin since 10/2010, without recurrence of afib.   Tremor, essential   Dizziness, near syncope   Syncope/Near Syncope - Possibly orthostatic hypotension versus vasovagal etiology - Admit to telemetry - Cycle cardiac enzymes - Check orthostatic blood pressures and urine microscopy - Patient clinically euvolemic. - 2-D echo 01/21/12: LVEF: 55-60% & grade 2 diastolic dysfunction - No carotid Dopplers in the system-will request. - PT and OT evaluation - Treat constipation  Constipation - Bowel regimen  GERD - Continue PPI  Anemia - Stable  CAD/PAF/PPM - Stable.   HTN - Continue home medications and monitor.  History of Orthostatic Hypotension - Mx as above. - Continue Midodrine  History of UTI - Continue Trimethoprim prophylaxis.  Code Status: Full  Family Communication: Discussed with son at the bedside.  Disposition Plan: Home when medically stable   Time spent: 60 minutes.  Marcellus Scott, MD, FACP, FHM. [facility] Pager [phone]  If 7PM-7AM, please contact night-coverage [email or web address] Password [email or web address] 02/13/2013, 4:59 PM

## 2013-02-13 NOTE — Progress Notes (Signed)
Transferred from the ED to 4E06. Patient oriented to room and call bell system. Vital signs stable. Patient currently lying down and will get orthostatic blood pressure after laying down for few minutes. Patietn complains of no pain. Will continue to monitor to end of shift.

## 2013-02-13 NOTE — Progress Notes (Signed)
While doing orthostatic blood pressure, when patient sat up from lying down patient stated she was a little dizzy and started burping. Within minutes patient began to have nausea and vomiting and projectile-like diarrhea. Was able to transfer patient to bedside commode with help of patient's son though stool all over bed and floor. IV zofran given per PRN order and was not effective. Patient vomited two to three more times after zofran given. Notified Dr. Waymon Amato. Telephone orders given for Stat abdominal portable xray and notify on-call MD of results and also orders given for Normal saline fluids at 52ml/hr. During orthostatic vitals diastolic dropped notified doctor, orders given to stop orthostatics for now due to nausea and vomiting.Followed orders. Notified oncoming nurse to notify on-call MD of abd xray results and to also notify MD if patient continues to vomit after zofran given. Will continue to monitor to end of shift.

## 2013-02-13 NOTE — ED Notes (Signed)
Pt was placed on a bedpan, but was unable to provide enough urine for a UA.

## 2013-02-14 ENCOUNTER — Observation Stay (HOSPITAL_COMMUNITY): Payer: Medicare Other

## 2013-02-14 DIAGNOSIS — I10 Essential (primary) hypertension: Secondary | ICD-10-CM

## 2013-02-14 LAB — URINALYSIS, ROUTINE W REFLEX MICROSCOPIC
Glucose, UA: NEGATIVE mg/dL
Ketones, ur: NEGATIVE mg/dL
Nitrite: POSITIVE — AB
Specific Gravity, Urine: 1.022 (ref 1.005–1.030)
pH: 6 (ref 5.0–8.0)

## 2013-02-14 LAB — URINE MICROSCOPIC-ADD ON

## 2013-02-14 LAB — TROPONIN I: Troponin I: <0.3 ng/mL (ref <0.30)

## 2013-02-14 MED ORDER — POLYETHYLENE GLYCOL 3350 17 G PO PACK: 17.0000 g | PACK | Freq: Every day | ORAL | Status: DC

## 2013-02-14 MED ORDER — POLYETHYLENE GLYCOL 3350 17 G PO PACK
17.0000 g | PACK | Freq: Every day | ORAL | Status: DC
  Administered 2013-02-14: 17 g via ORAL
  Filled 2013-02-14: qty 1

## 2013-02-14 NOTE — Discharge Summary (Signed)
Physician Discharge Summary  [name] Luna WUJ:[id number] DOB: 03-08-1921 DOA: 02/13/2013  PCP: [name] Georges, MD  Admit date: 02/13/2013 Discharge date: 02/14/2013  Time spent: 35 minutes  Recommendations for Outpatient Follow-up:  1. Follow up with PCP in 2 week. Evaluate if she is having regular BM.  Discharge Diagnoses:  Principal Problem:   Syncope Active Problems:   HYPOTHYROIDISM   HYPERTENSION   GERD   Anemia   H/O cardiac pacemaker, Medtronic versa implanted 01/2008, though original implanted 2000 for syncope and heart block and ventricular asystole.   Paroxysmal a-fib, history of, off coumadin since 10/2010, without recurrence of afib.   Tremor, essential   Constipation   Discharge Condition: stable  Diet recommendation: heart healthy diet  Filed Weights   02/13/13 1743  Weight: 69.1 kg (152 lb 5.4 oz)    History of present illness:  79 y.o. female with extensive past medical history-coronary artery disease, PAF, bradycardia, PPM, hypothyroid, GERD, essential tremor, UTI, syncope related to orthostatic hypotension, hypertension, presented to the ED with an episode of passing out. History is mostly provided by patient son who is at the bedside. Patient lives with the son. She is ambulant with a walker at times but mostly with the help of a wheelchair. She requires assistance for most activities of daily living. She has apparently been doing reasonably well over the last 1 year. Prior to that she had episodes of what appear to be orthostatic hypotension and syncope. This morning, patient complained of constipation and had not had a BM for approximately a week. She was sitting at her kitchen table. Her son provided her some milk of magnesia. Approximately 45 minutes later, she started complaining of dizziness and feeling like she was given a pass out. He wheeled her to her bedroom and the bedside commode next to her bed. While attempting to get out of the wheelchair  and sit on the commode, she suddenly became weak, and was slumping down. He was able to hold her and slight her back into the wheelchair and she apparently became nearly or fully unresponsive for 3-4 minutes   Hospital Course:  Syncope/ Constipation:  - Her orthostatic were positive. She is on midrodine.  - She also had sensation of wanting to go to the bathroom before the episode of syncope. [name] Her Abd x- showed large amount of stool burden.  - She had a large BM in house, repeat abd x-ray showed decrease stool burden. - wWll cont take Miralaax daily.   H/O cardiac pacemaker, Medtronic versa implanted 01/2008, though original implanted 2000 for syncope and heart block and ventricular asystole:  - No events on telemetry.   GERD  - Continue PPI.   Anemia  - Stable   CAD/PAF/PPM  - Stable.   HTN  - Continue home medications and monitor   Procedures:  Abdominal x-ray  Consultations:  none  Discharge Exam: Filed Vitals:   02/14/13 0622  BP: 145/52  Pulse: 72  Temp: 97.5 F (36.4 C)  Resp: 18    General: A&O x3 Cardiovascular: RRR Respiratory: good air movement CTA B/L  Discharge Instructions      Discharge Orders   Future Appointments Provider Department Dept Phone   03/02/2013 11:15 AM [name] Fair, MD [facility] [phone]   Future Orders Complete By Expires   Diet - low sodium heart healthy  As directed    Increase activity slowly  As directed        Medication List  STOP taking these medications       loperamide 2 MG capsule  Commonly known as:  IMODIUM     PRESERVISION/LUTEIN Caps      TAKE these medications       amiodarone 200 MG tablet  Commonly known as:  PACERONE  Take 1 tablet (200 mg total) by mouth daily.     aspirin 81 MG EC tablet  Take 81 mg by mouth daily.     clopidogrel 75 MG tablet  Commonly known as:  PLAVIX  Take 75 mg by mouth daily.     levothyroxine 100 MCG tablet  Commonly known as:   SYNTHROID, LEVOTHROID  Take 100 mcg by mouth daily before breakfast.     loratadine 10 MG tablet  Commonly known as:  CLARITIN  Take 10 mg by mouth daily.     losartan 50 MG tablet  Commonly known as:  COZAAR  Take 50 mg by mouth 2 (two) times daily.     metoprolol 50 MG tablet  Commonly known as:  LOPRESSOR  Take 50 mg by mouth at bedtime.     midodrine 2.5 MG tablet  Commonly known as:  PROAMATINE  Take 2.5 mg by mouth daily before breakfast.     nitroGLYCERIN 0.4 MG SL tablet  Commonly known as:  NITROSTAT  Place 0.4 mg under the tongue every 5 (five) minutes as needed. For chest pain     pantoprazole 40 MG tablet  Commonly known as:  PROTONIX  Take 1 tablet (40 mg total) by mouth 2 (two) times daily.     polyethylene glycol packet  Commonly known as:  MIRALAX / GLYCOLAX  Take 17 g by mouth daily. For constipation     ranolazine 500 MG 12 hr tablet  Commonly known as:  RANEXA  Take 500 mg by mouth daily.     simvastatin 20 MG tablet  Commonly known as:  ZOCOR  Take 1 tablet (20 mg total) by mouth at bedtime.     traMADol 50 MG tablet  Commonly known as:  ULTRAM  Take 25 mg by mouth at bedtime.     trimethoprim 100 MG tablet  Commonly known as:  TRIMPEX  Take 100 mg by mouth at bedtime.       Allergies  Allergen Reactions  . Codeine Other (See Comments)    Passes out  . Isosorbide Mononitrate Other (See Comments)    fainted  . Lorazepam Other (See Comments)    hallucinations  . Penicillins Rash  . Imdur [Isosorbide] Other (See Comments)    Passes out  . Prednisone Nausea And Vomiting  . Valium [Diazepam] Other (See Comments)    Reaction unknown   Follow-up Information   Follow up with [name] ALLEN, MD In 2 weeks. (hospital follow up)    Specialty:  Family Medicine   Contact information:   [address] [place] Flake [place] [address] [phone]        The results of significant diagnostics from this hospitalization (including  imaging, microbiology, ancillary and laboratory) are listed below for reference.    Significant Diagnostic Studies: Dg Chest 1 View  02/13/2013   CLINICAL DATA:  Loss of consciousness.  Shortness of breath.  EXAM: CHEST - 1 VIEW  COMPARISON:  05/02/2012  FINDINGS: The heart is enlarged. Patient has right-sided transvenous pacemaker with leads to the right atrium and right ventricle. There are no focal consolidations or pleural effusions. Minimal density at the right lung base is felt to  be chronic. There is no pulmonary edema.  IMPRESSION: 1. Cardiomegaly without pulmonary edema. 2.  No focal pulmonary abnormality.   Electronically Signed   By: Rosalie Gums M.D.   On: 02/13/2013 14:22   Dg Abd 1 View  02/14/2013   CLINICAL DATA:  Nausea and vomiting for 2 days.  No abdominal pain.  EXAM: ABDOMEN - 1 VIEW  COMPARISON:  Abdomen radiographs, 02/13/2013.  CT, 11/07/2011.  FINDINGS: There is air throughout much of the colon without overt dilation. There is no evidence of obstruction.  Rectal stool appears decreased from the previous day's study. Overall bowel gas is mildly increased.  Clips are upper quadrant reflect cholecystectomy. There are dense aortoiliac and splenic artery calcifications.  IMPRESSION: No obstruction. Generalized increased bowel gas. Stool in the rectum appears decreased from the previous day's study.   Electronically Signed   By: Amie Portland M.D.   On: 02/14/2013 11:42   Ct Head Wo Contrast (only If Suspected Head Trauma And/or Pt Is On Anticoagulant)  02/13/2013   CLINICAL DATA:  Loss of consciousness, suspected head trauma  EXAM: CT HEAD WITHOUT CONTRAST  TECHNIQUE: Contiguous axial images were obtained from the base of the skull through the vertex without contrast.  COMPARISON:  05/02/2012  FINDINGS: Stable brain atrophy and extensive white matter chronic microvascular ischemic change throughout the cerebrum. No acute intracranial hemorrhage, mass lesion, definite infarction, focal  edema or mass effect. No hydrocephalus, herniation, or extra-axial fluid collection. Cisterns are patent. Cerebellar atrophy as well. Atherosclerosis of the intracranial vessels. Mastoids and sinuses clear.  IMPRESSION: Stable atrophy and advanced white matter microvascular ischemic change. No interval change or acute process by noncontrast CT   Electronically Signed   By: Ruel Favors M.D.   On: 02/13/2013 14:47   Dg Abd Portable 2v  02/13/2013   CLINICAL DATA:  Fecal impaction.  Vomiting.  EXAM: PORTABLE ABDOMEN - 2 VIEW  COMPARISON:  CT, 11/07/2011  FINDINGS: There is moderate stool distending the rectum. Mildly dilated partly gas-filled colon is also noted in the central abdomen and right lower quadrant. No evidence of a bowel obstruction.  Clips are upper quadrant are consistent with a prior cholecystectomy. There are left upper quadrant and aortoiliac vascular calcifications. The soft tissues are otherwise unremarkable.  IMPRESSION: Moderate impaction of the rectum with stool.  No bowel obstruction.   Electronically Signed   By: Amie Portland M.D.   On: 02/13/2013 19:38    Microbiology: No results found for this or any previous visit (from the past 240 hour(s)).   Labs: Basic Metabolic Panel:  Recent Labs Lab 02/13/13 1350  NA 140  K 3.7  CL 102  CO2 28  GLUCOSE 104*  BUN 15  CREATININE 0.86  CALCIUM 8.9   Liver Function Tests:  Recent Labs Lab 02/13/13 1350  AST 26  ALT 18  ALKPHOS 162*  BILITOT 0.4  PROT 7.0  ALBUMIN 3.5   No results found for this basename: LIPASE, AMYLASE,  in the last 168 hours No results found for this basename: AMMONIA,  in the last 168 hours CBC:  Recent Labs Lab 02/13/13 1350  WBC 7.3  HGB 11.3*  HCT 35.2*  MCV 83.8  PLT 231   Cardiac Enzymes:  Recent Labs Lab 02/13/13 1351 02/13/13 1942 02/14/13 0133 02/14/13 0600  TROPONINI <0.30 <0.30 <0.30 <0.30   BNP: BNP (last 3 results)  Recent Labs  02/13/13 1351  PROBNP 507.6*    CBG:  Recent Labs Lab  02/13/13 1354  GLUCAP 97       Signed:  Marinda Elk  [facility] 02/14/2013, 12:26 PM

## 2013-02-14 NOTE — Progress Notes (Signed)
PT Cancellation Note  Patient Details Name: [name] Luna MRN: [id number] DOB: 1921/01/05   Cancelled Treatment:    Reason Eval/Treat Not Completed: Medical issues which prohibited therapy.  Pt sick in the am.  Will reattempt as able in pm.  Thanks.    INGOLD,Elliona Doddridge 02/14/2013, 3:26 PM

## 2013-02-14 NOTE — Progress Notes (Signed)
[facility] PROGRESS NOTE Assessment/Plan: *Syncope/  Constipation: - Her orthostatic were positive. She is on midrodine. Was on IV fluid overnight. - She also had sensation of wanting to go to the bathroom. Her Abd x- showed large amount of stool burden. - She had a large BM in house, repeat abd x-ray. If rectal vault empty, then can be d/c home.    H/O cardiac pacemaker, Medtronic versa implanted 01/2008, though original implanted 2000 for syncope and heart block and ventricular asystole: - no events on telemetry.  GERD  - Continue PPI.  Anemia  - Stable   CAD/PAF/PPM  - Stable.   HTN  - Continue home medications and monitor.   Code Status: Full  Family Communication: Discussed with son at the bedside.  Disposition Plan: Home when medically stable     Consultants:  none  Procedures:  none  Antibiotics:  None  HPI/Subjective: She relates she feels better.  Objective: Filed Vitals:   02/13/13 1820 02/13/13 2147 02/14/13 0213 02/14/13 0622  BP: 122/51 171/49 142/49 145/52  Pulse: 81 81 74 72  Temp:  98 F (36.7 C) 98.1 F (36.7 C) 97.5 F (36.4 C)  TempSrc:  Oral Oral Oral  Resp:  17 17 18   Height:      Weight:      SpO2:  94% 94% 96%    Intake/Output Summary (Last 24 hours) at 02/14/13 0853 Last data filed at 02/13/13 2200  Gross per 24 hour  Intake    240 ml  Output      0 ml  Net    240 ml   Filed Weights   02/13/13 1743  Weight: 69.1 kg (152 lb 5.4 oz)    Exam:  General: Alert, awake, oriented x3, in no acute distress.  HEENT: No bruits, no goiter.  Heart: Regular rate and rhythm, without murmurs, rubs, gallops.  Lungs: Good air movement, clear to auscultation. Abdomen: Soft, mild tenderness diffuse, nondistended, positive bowel sounds.  Neuro: Grossly intact, nonfocal.   Data Reviewed: Basic Metabolic Panel:  Recent Labs Lab 02/13/13 1350  NA 140  K 3.7  CL 102  CO2 28  GLUCOSE 104*  BUN 15  CREATININE 0.86   CALCIUM 8.9   Liver Function Tests:  Recent Labs Lab 02/13/13 1350  AST 26  ALT 18  ALKPHOS 162*  BILITOT 0.4  PROT 7.0  ALBUMIN 3.5   No results found for this basename: LIPASE, AMYLASE,  in the last 168 hours No results found for this basename: AMMONIA,  in the last 168 hours CBC:  Recent Labs Lab 02/13/13 1350  WBC 7.3  HGB 11.3*  HCT 35.2*  MCV 83.8  PLT 231   Cardiac Enzymes:  Recent Labs Lab 02/13/13 1351 02/13/13 1942 02/14/13 0133 02/14/13 0600  TROPONINI <0.30 <0.30 <0.30 <0.30   BNP (last 3 results)  Recent Labs  02/13/13 1351  PROBNP 507.6*   CBG:  Recent Labs Lab 02/13/13 1354  GLUCAP 97    No results found for this or any previous visit (from the past 240 hour(s)).   Studies: Dg Chest 1 View  02/13/2013   CLINICAL DATA:  Loss of consciousness.  Shortness of breath.  EXAM: CHEST - 1 VIEW  COMPARISON:  05/02/2012  FINDINGS: The heart is enlarged. Patient has right-sided transvenous pacemaker with leads to the right atrium and right ventricle. There are no focal consolidations or pleural effusions. Minimal density at the right lung base is felt to be chronic. There  is no pulmonary edema.  IMPRESSION: 1. Cardiomegaly without pulmonary edema. 2.  No focal pulmonary abnormality.   Electronically Signed   By: Rosalie Gums M.D.   On: 02/13/2013 14:22   Ct Head Wo Contrast (only If Suspected Head Trauma And/or Pt Is On Anticoagulant)  02/13/2013   CLINICAL DATA:  Loss of consciousness, suspected head trauma  EXAM: CT HEAD WITHOUT CONTRAST  TECHNIQUE: Contiguous axial images were obtained from the base of the skull through the vertex without contrast.  COMPARISON:  05/02/2012  FINDINGS: Stable brain atrophy and extensive white matter chronic microvascular ischemic change throughout the cerebrum. No acute intracranial hemorrhage, mass lesion, definite infarction, focal edema or mass effect. No hydrocephalus, herniation, or extra-axial fluid collection.  Cisterns are patent. Cerebellar atrophy as well. Atherosclerosis of the intracranial vessels. Mastoids and sinuses clear.  IMPRESSION: Stable atrophy and advanced white matter microvascular ischemic change. No interval change or acute process by noncontrast CT   Electronically Signed   By: Ruel Favors M.D.   On: 02/13/2013 14:47   Dg Abd Portable 2v  02/13/2013   CLINICAL DATA:  Fecal impaction.  Vomiting.  EXAM: PORTABLE ABDOMEN - 2 VIEW  COMPARISON:  CT, 11/07/2011  FINDINGS: There is moderate stool distending the rectum. Mildly dilated partly gas-filled colon is also noted in the central abdomen and right lower quadrant. No evidence of a bowel obstruction.  Clips are upper quadrant are consistent with a prior cholecystectomy. There are left upper quadrant and aortoiliac vascular calcifications. The soft tissues are otherwise unremarkable.  IMPRESSION: Moderate impaction of the rectum with stool.  No bowel obstruction.   Electronically Signed   By: Amie Portland M.D.   On: 02/13/2013 19:38    Scheduled Meds: . amiodarone  200 mg Oral Daily  . aspirin EC  81 mg Oral Daily  . clopidogrel  75 mg Oral Daily  . enoxaparin (LOVENOX) injection  40 mg Subcutaneous Q24H  . levothyroxine  100 mcg Oral QAC breakfast  . loratadine  10 mg Oral Daily  . losartan  50 mg Oral BID  . metoprolol  50 mg Oral QHS  . midodrine  2.5 mg Oral QAC breakfast  . pantoprazole  40 mg Oral BID  . polyethylene glycol  17 g Oral Daily  . ranolazine  500 mg Oral Daily  . senna  2 tablet Oral Daily  . simvastatin  20 mg Oral QHS  . sodium chloride  3 mL Intravenous Q12H  . sodium chloride  3 mL Intravenous Q12H  . traMADol  25 mg Oral QHS  . trimethoprim  100 mg Oral QHS   Continuous Infusions:    Marinda Elk  [facility] Pager [phone]. If 8PM-8AM, please contact night-coverage at [email or web address], password [email or web address] 02/14/2013, 8:53 AM  LOS: 1 day

## 2013-02-14 NOTE — Progress Notes (Signed)
Pt d/c'd home with son. D/c instruction d/w son. Verbalizes understanding

## 2013-02-16 LAB — URINE CULTURE

## 2013-03-02 ENCOUNTER — Ambulatory Visit (INDEPENDENT_AMBULATORY_CARE_PROVIDER_SITE_OTHER): Payer: Medicare Other | Admitting: Cardiovascular Disease

## 2013-03-02 VITALS — BP 162/80 | HR 71 | Resp 16 | Ht 62.0 in | Wt 151.9 lb

## 2013-03-02 DIAGNOSIS — Z8679 Personal history of other diseases of the circulatory system: Secondary | ICD-10-CM

## 2013-03-02 DIAGNOSIS — I4891 Unspecified atrial fibrillation: Secondary | ICD-10-CM

## 2013-03-02 DIAGNOSIS — I480 Paroxysmal atrial fibrillation: Secondary | ICD-10-CM

## 2013-03-02 DIAGNOSIS — E785 Hyperlipidemia, unspecified: Secondary | ICD-10-CM

## 2013-03-02 DIAGNOSIS — I2510 Atherosclerotic heart disease of native coronary artery without angina pectoris: Secondary | ICD-10-CM

## 2013-03-02 DIAGNOSIS — Z950 Presence of cardiac pacemaker: Secondary | ICD-10-CM

## 2013-03-02 DIAGNOSIS — I10 Essential (primary) hypertension: Secondary | ICD-10-CM

## 2013-03-02 DIAGNOSIS — Z79899 Other long term (current) drug therapy: Secondary | ICD-10-CM

## 2013-03-02 LAB — COMPREHENSIVE METABOLIC PANEL
Albumin: 4 g/dL (ref 3.5–5.2)
CO2: 28 mEq/L (ref 19–32)
Calcium: 9.1 mg/dL (ref 8.4–10.5)
Chloride: 103 mEq/L (ref 96–112)
Creat: 0.77 mg/dL (ref 0.50–1.10)
Glucose, Bld: 85 mg/dL (ref 70–99)
Sodium: 139 mEq/L (ref 135–145)
Total Bilirubin: 0.5 mg/dL (ref 0.3–1.2)
Total Protein: 6.6 g/dL (ref 6.0–8.3)

## 2013-03-02 LAB — PACEMAKER DEVICE OBSERVATION

## 2013-03-02 LAB — LIPID PANEL
HDL: 58 mg/dL (ref >39)
Total CHOL/HDL Ratio: 2.6 Ratio
Triglycerides: 91 mg/dL (ref <150)

## 2013-03-02 LAB — CBC
MCH: 25.5 pg — ABNORMAL LOW (ref 26.0–34.0)
MCV: 80.8 fL (ref 78.0–100.0)
Platelets: 279 10*3/uL (ref 150–400)
RBC: 4.11 MIL/uL (ref 3.87–5.11)
RDW: 16.5 % — ABNORMAL HIGH (ref 11.5–15.5)
WBC: 4.4 10*3/uL (ref 4.0–10.5)

## 2013-03-02 NOTE — Patient Instructions (Addendum)
Remote monitoring is used to monitor your pacemaker from home. This monitoring reduces the number of office visits required to check your device to one time per year. It allows [place] to keep an eye on the functioning of your device to ensure it is working properly. You are scheduled for a device check from home on 06-03-2013. You may send your transmission at any time that day. If you have a wireless device, the transmission will be sent automatically. After your physician reviews your transmission, you will receive a postcard with your next transmission date.  Your physician recommends that you return for lab work.   Your physician recommends that you schedule a follow-up appointment in: 6 months

## 2013-03-03 LAB — MDC_IDC_ENUM_SESS_TYPE_INCLINIC
Battery Impedance: 1122 Ohm
Battery Voltage: 2.78 V
Brady Statistic AP VP Percent: 70.7 %
Brady Statistic AP VS Percent: 28.4 %
Brady Statistic AS VP Percent: 0.1 % — CL
Lead Channel Impedance Value: 557 Ohm
Lead Channel Impedance Value: 834 Ohm
Lead Channel Pacing Threshold Amplitude: 0.5 V
Lead Channel Pacing Threshold Amplitude: 1 V
Lead Channel Pacing Threshold Pulse Width: 0.4 ms
Lead Channel Pacing Threshold Pulse Width: 0.64 ms
Lead Channel Pacing Threshold Pulse Width: 1 ms
Lead Channel Sensing Intrinsic Amplitude: 1.4 mV
Lead Channel Sensing Intrinsic Amplitude: 11.2 mV
Lead Channel Setting Pacing Amplitude: 1.5 V
Lead Channel Setting Pacing Amplitude: 2.5 V
Lead Channel Setting Pacing Pulse Width: 0.4 ms
Lead Channel Setting Sensing Sensitivity: 4 mV

## 2013-03-09 ENCOUNTER — Other Ambulatory Visit: Payer: Self-pay | Admitting: *Deleted

## 2013-03-09 ENCOUNTER — Encounter: Payer: Self-pay | Admitting: Cardiovascular Disease

## 2013-03-09 DIAGNOSIS — E785 Hyperlipidemia, unspecified: Secondary | ICD-10-CM | POA: Insufficient documentation

## 2013-03-09 MED ORDER — TRAMADOL HCL 50 MG PO TABS: 25.0000 mg | ORAL_TABLET | Freq: Every day | ORAL | Status: DC

## 2013-03-09 NOTE — Telephone Encounter (Signed)
Rx for Tramadol 50mg  1/2 tab qhs #45 w/0 refills called to [facility].

## 2013-03-09 NOTE — Assessment & Plan Note (Signed)
Time to recheck her lipid profile. The potential interaction between amiodarone and simvastatin is noted. She has been taking this combination of medicines for at least 2-1/2 years without adverse events.

## 2013-03-09 NOTE — Assessment & Plan Note (Signed)
Unfortunately she had significant problems with orthostatic hypotension. Target systolic blood pressure is probably in the 150-160 range. Reminded not to take ProAmatine before lying down, not to take it at night.

## 2013-03-09 NOTE — Assessment & Plan Note (Signed)
Last percutaneous revascularization was performed in 2012. Disease has generally been limited to the proximal LAD without other significant stenoses. Chest preserved left ventricular systolic function. Most recent nuclear stress test was performed in May of 2013 and did not show any perfusion abnormalities.

## 2013-03-09 NOTE — Progress Notes (Signed)
Patient ID: [name] Luna, female   DOB: [birth year], 79 y.o.   MRN: [id number]      Reason for office visit CAD, atrial fibrillation, complete heart block,pacemaker followup  Formerly a patient of Dr. Susa Griffins, [name] Luna is here to establish new cardiology followup.  She has a history of coronary disease and received a stent to the LAD artery in 2012 (drug-eluting resolute 3.0 x 18 mm, just distal to an older 3.5 x 13 mm Promus stent placed in 2008). She has a history of complete heart block and tachycardia bradycardia syndrome and received a dual-chamber terminal pacemaker in 2009 (Medtronic versa). She hs a history of paroxysmal atrial fibrillation but is felt to be a poor candidate for full anti-coagulation therapy. She takes aspirin and clopidogrel. She is quite debilitated and spends most of her time in a wheelchair, although at home she does occasionally use a walker. She has had recurrent problems with dizziness that appear to be secondary to orthostatic hypotension and takes Midodrine. She had a syncopal event in November not associated with significant injury.  She also has a history of gastroesophageal reflux disease, esophageal stricture requiring dilatation as well as hyperlipidemia. She has progressive dementia.  Today she has no cardiac complaints.    Allergies  Allergen Reactions  . Codeine Other (See Comments)    Passes out  . Isosorbide Mononitrate Other (See Comments)    fainted  . Lorazepam Other (See Comments)    hallucinations  . Penicillins Rash  . Imdur [Isosorbide] Other (See Comments)    Passes out  . Prednisone Nausea And Vomiting  . Valium [Diazepam] Other (See Comments)    Reaction unknown    Current Outpatient Prescriptions  Medication Sig Dispense Refill  . amiodarone (PACERONE) 200 MG tablet Take 1 tablet (200 mg total) by mouth daily.  30 tablet  11  . aspirin 81 MG EC tablet Take 81 mg by mouth daily.        . clopidogrel (PLAVIX) 75  MG tablet Take 75 mg by mouth daily.        [name] levothyroxine (SYNTHROID, LEVOTHROID) 100 MCG tablet Take 100 mcg by mouth daily before breakfast.      . loratadine (CLARITIN) 10 MG tablet Take 10 mg by mouth daily.        [name] losartan (COZAAR) 50 MG tablet Take 50 mg by mouth 2 (two) times daily.      . metoprolol (LOPRESSOR) 50 MG tablet Take 50 mg by mouth at bedtime.      . midodrine (PROAMATINE) 2.5 MG tablet Take 2.5 mg by mouth daily before breakfast.      . nitroGLYCERIN (NITROSTAT) 0.4 MG SL tablet Place 0.4 mg under the tongue every 5 (five) minutes as needed. For chest pain      . pantoprazole (PROTONIX) 40 MG tablet Take 1 tablet (40 mg total) by mouth 2 (two) times daily.  60 tablet  1  . polyethylene glycol (MIRALAX / GLYCOLAX) packet Take 17 g by mouth every other day. For constipation      . ranolazine (RANEXA) 500 MG 12 hr tablet Take 500 mg by mouth daily.       . simvastatin (ZOCOR) 20 MG tablet Take 1 tablet (20 mg total) by mouth at bedtime.  90 tablet  3  . traMADol (ULTRAM) 50 MG tablet Take 25 mg by mouth at bedtime.      [name] trimethoprim (TRIMPEX) 100 MG tablet Take 100 mg by mouth  at bedtime.      . Multiple Vitamins-Minerals (PRESERVISION AREDS 2) CAPS Take 1 capsule by mouth 2 (two) times daily.       No current facility-administered medications for this visit.    Past Medical History  Diagnosis Date  . CHF (congestive heart failure)   . A-fib   . Hypothyroid   . GERD (gastroesophageal reflux disease)   . Hypothyroidism   . Bradycardia   . Pancreatitis   . Coronary artery disease   . Shingles 1980's    "twice; once on my front side; once on my back" (02/07/2012)  . CORONARY ARTERY DISEASE 11/28/2006    Qualifier: Diagnosis of  By: Tawanna Cooler MD, Eugenio Hoes   . Pacemaker   . Paroxysmal a-fib, history of 08/02/2011  . Tremor, essential 08/02/2011  . Esophageal disorder, with history of dilatation 08/02/2011  . Angina   . H/O hiatal hernia   . Neuromuscular disorder      essential tremors  . UTI (lower urinary tract infection) 08/03/2011  . Dizziness, near syncope 11/07/2011  . Macular degeneration, dry     "both eyes" (02/07/2012)  . Hypertension   . HTN (hypertension), accelerated this admit 11/08/2011  . Pneumonia 1927  . Syncope     Past Surgical History  Procedure Laterality Date  . Repair spigelian hernia    . S/p childbirth      x2  . Cataract extraction w/ intraocular lens  implant, bilateral  ~ 1997  . Vaginal hysterectomy  1970's  . Left oophorectomy  1970's  . Insert / replace / remove pacemaker  2000; 2009    initial placement; replaced  . Appendectomy  1933  . Cholecystectomy  1950's  . Hernia repair    . Esophageal dilation      "once" (02/07/2012)  . Cardiac catheterization  12/24/2005    No intervention - recommend medical therapy  . Cardiac catheterization  10/13/2006    80% proximal LAD stenosis, stented w/ a 3.5x60mm Cypher stent at 14atm (3.28mm), resulting in reduction of 80% to 0% residual.  . Cardiac catheterization  11/14/2006    No intervention - recommend medical therapy  . Cardiac catheterization  07/24/2007    No intervention.  . Cardiac catheterization  01/16/2010    75% proximal LAD stenosis w/ in-stent restenosis. No intervention. Dr Allyson Sabal will intervene on proximal LAD lesion.  . Cardiac catheterization  01/16/2010    75% proximal LAD stenosis, stented w/ a 3x1mm Promus DES at 16atm, resulting in reduction 75% to 0% residual  . Cardioversion  07/11/2010    Successful conversion to V paced verified via pacemaker.  . Cardiac catheterization  09/08/2010    LAD 75% in-stent restenosis, stented w/ a 3x65mm Resolute DES at 16atm (3.51mm), resulting in reduction of 75% stenosis  . Renal doppler  12/12/2010    Bilat proximal renal 1-59% diameter reduction. Bilat kidneys are normal.  . Cardiac catheterization  12/22/2010    No intervention - recommend medical therapy.  . Cardiovascular stress test  08/09/2011    No scintigraphic  evidence of inducible myocardial ischemia. Non-diagnostic for ischemia.  . Transthoracic echocardiogram  01/21/2012    EF 55-60%, mild-moderate tricuspid regurg.    Family History  Problem Relation Age of Onset  . Sudden death Mother     History   Social History  . Marital Status: Married    Spouse Name: N/A    Number of Children: N/A  . Years of Education: N/A   Occupational  History  . Not on file.   Social History Main Topics  . Smoking status: Never Smoker   . Smokeless tobacco: Never Used  . Alcohol Use: No  . Drug Use: No  . Sexual Activity: No   Other Topics Concern  . Not on file   Social History Narrative  . No narrative on file    Review of systems: Syncope in November. The patient specifically denies any chest pain at rest or with exertion, dyspnea at rest or with exertion, orthopnea, paroxysmal nocturnal dyspnea, palpitations, focal neurological deficits, intermittent claudication, lower extremity edema, unexplained weight gain, cough, hemoptysis or wheezing.  The patient also denies abdominal pain, nausea, vomiting, dysphagia, diarrhea, constipation, polyuria, polydipsia, dysuria, hematuria, frequency, urgency, abnormal bleeding or bruising, fever, chills, unexpected weight changes, mood swings, change in skin or hair texture, change in voice quality, auditory or visual problems, allergic reactions or rashes, new musculoskeletal complaints other than usual "aches and pains".   PHYSICAL EXAM BP 162/80  Pulse 71  Resp 16  Ht 5\' 2"  (1.575 m)  Wt 151 lb 14.4 oz (68.901 kg)  BMI 27.78 kg/m2  General: Alert, oriented x3, no distress Head: no evidence of trauma, PERRL, EOMI, no exophtalmos or lid lag, no myxedema, no xanthelasma; normal ears, nose and oropharynx Neck: normal jugular venous pulsations and no hepatojugular reflux; brisk carotid pulses without delay and no carotid bruits Chest: clear to auscultation, no signs of consolidation by percussion or  palpation, normal fremitus, symmetrical and full respiratory excursions, the pacemaker site looks healthy Cardiovascular: normal position and quality of the apical impulse, regular rhythm, normal first and second heart sounds, no murmurs, rubs, S4 gallop is heard Abdomen: no tenderness or distention, no masses by palpation, no abnormal pulsatility or arterial bruits, normal bowel sounds, no hepatosplenomegaly Extremities: no clubbing, cyanosis or edema; 2+ radial, ulnar and brachial pulses bilaterally; 2+ right femoral, posterior tibial and dorsalis pedis pulses; 2+ left femoral, posterior tibial and dorsalis pedis pulses; no subclavian or femoral bruits Neurological: grossly nonfocal   EKG: Atrial paced, ventricular sensed otherwise normal, QTC 473 ms  Lipid Panel     Component Value Date/Time   CHOL 150 03/02/2013 1155   TRIG 91 03/02/2013 1155   HDL 58 03/02/2013 1155   CHOLHDL 2.6 03/02/2013 1155   VLDL 18 03/02/2013 1155   LDLCALC 74 03/02/2013 1155    BMET    Component Value Date/Time   NA 139 03/02/2013 1155   K 3.6 03/02/2013 1155   CL 103 03/02/2013 1155   CO2 28 03/02/2013 1155   GLUCOSE 85 03/02/2013 1155   BUN 11 03/02/2013 1155   CREATININE 0.77 03/02/2013 1155   CREATININE 0.86 02/13/2013 1350   CALCIUM 9.1 03/02/2013 1155   GFRNONAA 58* 02/13/2013 1350   GFRAA 67* 02/13/2013 1350     ASSESSMENT AND PLAN CORONARY ARTERY DISEASE Last percutaneous revascularization was performed in 2012. Disease has generally been limited to the proximal LAD without other significant stenoses. Chest preserved left ventricular systolic function. Most recent nuclear stress test was performed in May of 2013 and did not show any perfusion abnormalities.  HYPERTENSION Unfortunately she had significant problems with orthostatic hypotension. Target systolic blood pressure is probably in the 150-160 range. Reminded not to take ProAmatine before lying down, not to take it at  night.  Hyperlipidemia Time to recheck her lipid profile. The potential interaction between amiodarone and simvastatin is noted. She has been taking this combination of medicines for at least 2-1/2  years without adverse events.  H/O cardiac pacemaker, Medtronic versa implanted 01/2008, though original implanted 2000 for syncope and heart block and ventricular asystole. She has a history of both sinus node arrest and paroxysmal complete heart block. Virtually 100% atrial pacing and 70% ventricular pacing. Normal device function. Thresholds, sensing, impedances consistent with previous measurements. Device programmed to maximize longevity. No mode switch or high ventricular rates noted. Device programmed at appropriate safety margins. Histogram distribution appropriate for patient activity level. Estimated longevity 4 years. Patient will follow up remotely on 06-03-2013    Paroxysmal a-fib, history of, off coumadin since 10/2010, without recurrence of afib. None recorded within the last 24 months.   Patient Instructions  Remote monitoring is used to monitor your pacemaker from home. This monitoring reduces the number of office visits required to check your device to one time per year. It allows [place] to keep an eye on the functioning of your device to ensure it is working properly. You are scheduled for a device check from home on 06-03-2013. You may send your transmission at any time that day. If you have a wireless device, the transmission will be sent automatically. After your physician reviews your transmission, you will receive a postcard with your next transmission date.  Your physician recommends that you return for lab work.   Your physician recommends that you schedule a follow-up appointment in: 6 months        Orders Placed This Encounter  Procedures  . TSH  . Comp Met (CMET)  . CBC  . Lipid Profile  . Implantable device check  . EKG 12-Lead   Meds ordered this encounter   Medications  . polyethylene glycol (MIRALAX / GLYCOLAX) packet    Sig: Take 17 g by mouth every other day. For constipation    Sukhraj Esquivias  Thurmon Fair, MD, [facility] [phone] office [phone] pager

## 2013-03-09 NOTE — Assessment & Plan Note (Signed)
None recorded within the last 24 months.

## 2013-03-09 NOTE — Assessment & Plan Note (Signed)
She has a history of both sinus node arrest and paroxysmal complete heart block. Virtually 100% atrial pacing and 70% ventricular pacing. Normal device function. Thresholds, sensing, impedances consistent with previous measurements. Device programmed to maximize longevity. No mode switch or high ventricular rates noted. Device programmed at appropriate safety margins. Histogram distribution appropriate for patient activity level. Estimated longevity 4 years. Patient will follow up remotely on 06-03-2013

## 2013-03-12 ENCOUNTER — Ambulatory Visit: Payer: Medicare Other | Admitting: Family Medicine

## 2013-04-01 ENCOUNTER — Other Ambulatory Visit: Payer: Self-pay | Admitting: Cardiovascular Disease

## 2013-04-01 NOTE — Telephone Encounter (Signed)
Rx was sent to pharmacy electronically. 

## 2013-04-07 ENCOUNTER — Encounter: Payer: Self-pay | Admitting: Family Medicine

## 2013-04-07 ENCOUNTER — Ambulatory Visit (INDEPENDENT_AMBULATORY_CARE_PROVIDER_SITE_OTHER): Payer: Medicare Other | Admitting: Family Medicine

## 2013-04-07 VITALS — BP 160/90 | Temp 98.4°F | Wt 154.0 lb

## 2013-04-07 DIAGNOSIS — K5900 Constipation, unspecified: Secondary | ICD-10-CM

## 2013-04-07 NOTE — Patient Instructions (Signed)
Milk of magnesia 2 tablespoons daily in the morning  MiraLax one Monday Wednesday Friday  Cough any problems

## 2013-04-07 NOTE — Progress Notes (Signed)
   Subjective:    Patient ID: [name] Luna, female    DOB: [birth year], 80 y.o.   MRN: [id number]  HPI  Duha C.-year-old female who comes in today following a 24-hour hospital stay for constipation and secondary syncope  She had not had about a month for over week felt lightheaded and had a syncopal episode. Her son picked her up and put her in a chair and she came back in a couple minutes. An interpreter the hospital and after numerous diagnostic studies she was determined of constipation. She's given a stool softer abdominal and was discharged  Review of Systems    review of systems negative Objective:   Physical Exam  Well-developed well-nourished female no acute distress vital signs stable she is afebrile        Assessment & Plan:  Constipation............ outlined bowel program

## 2013-04-07 NOTE — Progress Notes (Signed)
Pre visit review using our clinic review tool, if applicable. No additional management support is needed unless otherwise documented below in the visit note. 

## 2013-05-06 ENCOUNTER — Emergency Department (HOSPITAL_COMMUNITY)
Admission: EM | Admit: 2013-05-06 | Discharge: 2013-05-06 | Disposition: A | Discharge status: Home / Self Care | Payer: Medicare Other | Attending: Emergency Medicine | Admitting: Emergency Medicine

## 2013-05-06 ENCOUNTER — Emergency Department (HOSPITAL_COMMUNITY): Payer: Medicare Other

## 2013-05-06 ENCOUNTER — Encounter (HOSPITAL_COMMUNITY): Payer: Self-pay | Admitting: Emergency Medicine

## 2013-05-06 ENCOUNTER — Telehealth: Payer: Self-pay | Admitting: Family Medicine

## 2013-05-06 DIAGNOSIS — E039 Hypothyroidism, unspecified: Secondary | ICD-10-CM | POA: Insufficient documentation

## 2013-05-06 DIAGNOSIS — Z8619 Personal history of other infectious and parasitic diseases: Secondary | ICD-10-CM | POA: Insufficient documentation

## 2013-05-06 DIAGNOSIS — Z7982 Long term (current) use of aspirin: Secondary | ICD-10-CM | POA: Insufficient documentation

## 2013-05-06 DIAGNOSIS — I2510 Atherosclerotic heart disease of native coronary artery without angina pectoris: Secondary | ICD-10-CM | POA: Insufficient documentation

## 2013-05-06 DIAGNOSIS — Z7902 Long term (current) use of antithrombotics/antiplatelets: Secondary | ICD-10-CM | POA: Insufficient documentation

## 2013-05-06 DIAGNOSIS — Z79899 Other long term (current) drug therapy: Secondary | ICD-10-CM | POA: Insufficient documentation

## 2013-05-06 DIAGNOSIS — I509 Heart failure, unspecified: Secondary | ICD-10-CM | POA: Insufficient documentation

## 2013-05-06 DIAGNOSIS — Z880 Allergy status to penicillin: Secondary | ICD-10-CM | POA: Insufficient documentation

## 2013-05-06 DIAGNOSIS — I10 Essential (primary) hypertension: Secondary | ICD-10-CM | POA: Insufficient documentation

## 2013-05-06 DIAGNOSIS — K219 Gastro-esophageal reflux disease without esophagitis: Secondary | ICD-10-CM | POA: Insufficient documentation

## 2013-05-06 DIAGNOSIS — Z950 Presence of cardiac pacemaker: Secondary | ICD-10-CM | POA: Insufficient documentation

## 2013-05-06 DIAGNOSIS — Z87440 Personal history of urinary (tract) infections: Secondary | ICD-10-CM | POA: Insufficient documentation

## 2013-05-06 DIAGNOSIS — M79609 Pain in unspecified limb: Secondary | ICD-10-CM

## 2013-05-06 DIAGNOSIS — I4891 Unspecified atrial fibrillation: Secondary | ICD-10-CM | POA: Insufficient documentation

## 2013-05-06 DIAGNOSIS — Z8701 Personal history of pneumonia (recurrent): Secondary | ICD-10-CM | POA: Insufficient documentation

## 2013-05-06 DIAGNOSIS — Z9861 Coronary angioplasty status: Secondary | ICD-10-CM | POA: Insufficient documentation

## 2013-05-06 DIAGNOSIS — M25559 Pain in unspecified hip: Secondary | ICD-10-CM | POA: Insufficient documentation

## 2013-05-06 DIAGNOSIS — M79652 Pain in left thigh: Secondary | ICD-10-CM

## 2013-05-06 MED ORDER — OXYCODONE-ACETAMINOPHEN 5-325 MG PO TABS: 1.0000 | ORAL_TABLET | Freq: Four times a day (QID) | ORAL | Status: DC | PRN

## 2013-05-06 MED ORDER — OXYCODONE-ACETAMINOPHEN 5-325 MG PO TABS
1.0000 | ORAL_TABLET | Freq: Once | ORAL | Status: AC
  Administered 2013-05-06: 1 via ORAL
  Filled 2013-05-06: qty 1

## 2013-05-06 NOTE — Discharge Instructions (Signed)
PLEASE RETURN FOR WORSENED PAIN, WEAKNESS, DISCOLORATION OR IF YOU ARE UNABLE TO WALK DUE TO PAIN PLEASE SEE YOUR DOCTOR IN ONE WEEK

## 2013-05-06 NOTE — ED Notes (Signed)
Pt reports pain to left thigh/knee area for a couple of days. Denies known injury. Pain with ambulation when using walker.

## 2013-05-06 NOTE — Telephone Encounter (Signed)
Triage nurse received dispatch call regarding pt.  Attempted to call Peyton NajjarLarry, son, back regarding pt's leg pain.  No answer, left message to call back with any further needs.

## 2013-05-06 NOTE — ED Notes (Signed)
Dr. Wickline at the bedside.  

## 2013-05-06 NOTE — Progress Notes (Signed)
VASCULAR LAB PRELIMINARY  PRELIMINARY  PRELIMINARY  PRELIMINARY  Left lower extremity venous duplex completed.    Preliminary report:  Left:  No evidence of DVT, superficial thrombosis, or Baker's cyst.  Neomia Herbel, RVS 05/06/2013, 1:47 PM

## 2013-05-06 NOTE — ED Provider Notes (Signed)
CSN: [id number]     Arrival date & time 05/06/13  1130 History   First MD Initiated Contact with Patient 05/06/13 1144     Chief Complaint  Patient presents with  . Leg Pain   Pt seen with medical student, I performed history/physical/documentation    Patient is a 80 y.o. female presenting with leg pain. The history is provided by the patient.  Leg Pain Lower extremity pain location: left thigh and knee. Pain details:    Quality:  Aching   Severity:  Moderate   Onset quality:  Gradual   Timing:  Constant   Progression:  Worsening Chronicity:  New Relieved by:  Rest Exacerbated by: movement of leg. Associated symptoms: no back pain, no fever, no muscle weakness, no numbness and no swelling   pt presents with gradual onset of left knee/thigh pain for past 4 days No recent trauama Denies back pain No weakness/numbness/discoloration to left LE No h/o DVT She lives with son and mainly uses wheelchair for mobility She has never had this pain before  Past Medical History  Diagnosis Date  . CHF (congestive heart failure)   . A-fib   . Hypothyroid   . GERD (gastroesophageal reflux disease)   . Hypothyroidism   . Bradycardia   . Pancreatitis   . Coronary artery disease   . Shingles 1980's    "twice; once on my front side; once on my back" (02/07/2012)  . CORONARY ARTERY DISEASE 11/28/2006    Qualifier: Diagnosis of  By: Tawanna Cooler MD, Eugenio Hoes   . Pacemaker   . Paroxysmal a-fib, history of 08/02/2011  . Tremor, essential 08/02/2011  . Esophageal disorder, with history of dilatation 08/02/2011  . Angina   . H/O hiatal hernia   . Neuromuscular disorder     essential tremors  . UTI (lower urinary tract infection) 08/03/2011  . Dizziness, near syncope 11/07/2011  . Macular degeneration, dry     "both eyes" (02/07/2012)  . Hypertension   . HTN (hypertension), accelerated this admit 11/08/2011  . Pneumonia 1927  . Syncope    Past Surgical History  Procedure Laterality Date  . Repair  spigelian hernia    . S/p childbirth      x2  . Cataract extraction w/ intraocular lens  implant, bilateral  ~ 1997  . Vaginal hysterectomy  1970's  . Left oophorectomy  1970's  . Insert / replace / remove pacemaker  2000; 2009    initial placement; replaced  . Appendectomy  1933  . Cholecystectomy  1950's  . Hernia repair    . Esophageal dilation      "once" (02/07/2012)  . Cardiac catheterization  12/24/2005    No intervention - recommend medical therapy  . Cardiac catheterization  10/13/2006    80% proximal LAD stenosis, stented w/ a 3.5x73mm Cypher stent at 14atm (3.47mm), resulting in reduction of 80% to 0% residual.  . Cardiac catheterization  11/14/2006    No intervention - recommend medical therapy  . Cardiac catheterization  07/24/2007    No intervention.  . Cardiac catheterization  01/16/2010    75% proximal LAD stenosis w/ in-stent restenosis. No intervention. Dr Allyson Sabal will intervene on proximal LAD lesion.  . Cardiac catheterization  01/16/2010    75% proximal LAD stenosis, stented w/ a 3x46mm Promus DES at 16atm, resulting in reduction 75% to 0% residual  . Cardioversion  07/11/2010    Successful conversion to V paced verified via pacemaker.  . Cardiac catheterization  09/08/2010  LAD 75% in-stent restenosis, stented w/ a 3x4218mm Resolute DES at 16atm (3.5836mm), resulting in reduction of 75% stenosis  . Renal doppler  12/12/2010    Bilat proximal renal 1-59% diameter reduction. Bilat kidneys are normal.  . Cardiac catheterization  12/22/2010    No intervention - recommend medical therapy.  . Cardiovascular stress test  08/09/2011    No scintigraphic evidence of inducible myocardial ischemia. Non-diagnostic for ischemia.  . Transthoracic echocardiogram  01/21/2012    EF 55-60%, mild-moderate tricuspid regurg.   Family History  Problem Relation Age of Onset  . Sudden death Mother    History  Substance Use Topics  . Smoking status: Never Smoker   . Smokeless tobacco: Never  Used  . Alcohol Use: No   OB History   Grav Para Term Preterm Abortions TAB SAB Ect Mult Living                 Review of Systems  Constitutional: Negative for fever.  Musculoskeletal: Negative for back pain.  All other systems reviewed and are negative.    Allergies  Codeine; Isosorbide mononitrate; Lorazepam; Penicillins; Imdur; Prednisone; and Valium  Home Medications   Current Outpatient Rx  Name  Route  Sig  Dispense  Refill  . amiodarone (PACERONE) 200 MG tablet   Oral   Take 1 tablet (200 mg total) by mouth daily.   30 tablet   11   . aspirin 81 MG EC tablet   Oral   Take 81 mg by mouth daily.           . clopidogrel (PLAVIX) 75 MG tablet   Oral   Take 75 mg by mouth daily.           [name]. levothyroxine (SYNTHROID, LEVOTHROID) 100 MCG tablet   Oral   Take 100 mcg by mouth daily before breakfast.         . loratadine (CLARITIN) 10 MG tablet   Oral   Take 10 mg by mouth daily.           [name]. losartan (COZAAR) 50 MG tablet   Oral   Take 50 mg by mouth 2 (two) times daily.         . metoprolol (LOPRESSOR) 50 MG tablet   Oral   Take 50 mg by mouth at bedtime.         . midodrine (PROAMATINE) 2.5 MG tablet   Oral   Take 2.5 mg by mouth daily before breakfast.         . Multiple Vitamins-Minerals (PRESERVISION AREDS 2) CAPS   Oral   Take 1 capsule by mouth daily.          . pantoprazole (PROTONIX) 40 MG tablet   Oral   Take 40 mg by mouth 2 (two) times daily.         . polyethylene glycol (MIRALAX / GLYCOLAX) packet   Oral   Take 17 g by mouth every other day. For constipation         . ranolazine (RANEXA) 500 MG 12 hr tablet   Oral   Take 500 mg by mouth daily.          . simvastatin (ZOCOR) 20 MG tablet   Oral   Take 1 tablet (20 mg total) by mouth at bedtime.   90 tablet   3   . traMADol (ULTRAM) 50 MG tablet   Oral   Take 0.5 tablets (25 mg total) by mouth at bedtime.  90 tablet   0   . trimethoprim (TRIMPEX) 100  MG tablet   Oral   Take 100 mg by mouth at bedtime.         . nitroGLYCERIN (NITROSTAT) 0.4 MG SL tablet   Sublingual   Place 0.4 mg under the tongue every 5 (five) minutes as needed for chest pain. For chest pain          BP 195/61  Pulse 82  Temp(Src) 97.9 F (36.6 C) (Oral)  Resp 16  SpO2 100% Physical Exam CONSTITUTIONAL: Well developed/well nourished HEAD: Normocephalic/atraumatic EYES: EOMI/PERRL ENMT: Mucous membranes moist NECK: supple no meningeal signs SPINE:entire spine nontender CV: S1/S2 noted, no murmurs/rubs/gallops noted LUNGS: Lungs are clear to auscultation bilaterally, no apparent distress ABDOMEN: soft, nontender, no rebound or guarding GU:no cva tenderness NEURO: Pt is awake/alert, moves all extremitiesx4, she can flex left hip and flex/extend left knee and also plantar/dorsiflex left foot with equal power when compared to right LE EXTREMITIES: pulses normal, full ROM, distal pulses equal/intact in feet, no edema noted, no erythema noted, she is tender to palpation of left thigh as well as left knee.  No bony deformity noted SKIN: warm, color normal PSYCH: no abnormalities of mood noted  ED Course  Procedures (including critical care time) Labs Review Labs Reviewed - No data to display Imaging Review No results found.  EKG Interpretation   None       MDM  No diagnosis found. Nursing notes including past medical history and social history reviewed and considered in documentation xrays reviewed and considered  12:32 PM Plan is to image left LE as well perform DVT study Pt/family agreeable  2:29 PM PT IMPROVED SHE IS AMBULATORY AND FEELS WELL AT THIS TIME SHE WOULD LIKE TO GO HOME WILL STOP TRAMADOL AND START PERCOCET DISCUSSED APPROPRIATE USE OF PERCOCET PT/SON FEEL COMFORTABLE WITH PLAN  Joya Gaskins, MD 05/06/13 1430

## 2013-05-06 NOTE — ED Notes (Signed)
Pt was able to ambulate in the hall with walker and stand by assist.

## 2013-05-06 NOTE — ED Notes (Signed)
Hooked pt back up to monitor 

## 2013-05-08 ENCOUNTER — Telehealth: Payer: Self-pay | Admitting: Cardiovascular Disease

## 2013-05-08 NOTE — Telephone Encounter (Signed)
Her insurance will no longer pay for her take 2 tablets of Pantoprazole a day. Please call to advise on what she needs to do.

## 2013-05-08 NOTE — Telephone Encounter (Signed)
Returned call and pt verified x 2 w/ Peyton NajjarLarry, pt's son.  Stated insurance company will not pay for pantoprazole twice daily and pt has about a week left of medication.  Son informed Britta MccreedyBarbara will be notified to contact insurance company r/t this.  Verbalized understanding.  [place].  [employer]: [phone]

## 2013-05-11 ENCOUNTER — Emergency Department (HOSPITAL_COMMUNITY)
Admission: EM | Admit: 2013-05-11 | Discharge: 2013-05-11 | Disposition: A | Discharge status: Home / Self Care | Payer: Medicare Other | Attending: Emergency Medicine | Admitting: Emergency Medicine

## 2013-05-11 ENCOUNTER — Encounter (HOSPITAL_COMMUNITY): Payer: Self-pay | Admitting: Emergency Medicine

## 2013-05-11 DIAGNOSIS — Z8701 Personal history of pneumonia (recurrent): Secondary | ICD-10-CM | POA: Insufficient documentation

## 2013-05-11 DIAGNOSIS — I209 Angina pectoris, unspecified: Secondary | ICD-10-CM | POA: Insufficient documentation

## 2013-05-11 DIAGNOSIS — I10 Essential (primary) hypertension: Secondary | ICD-10-CM | POA: Insufficient documentation

## 2013-05-11 DIAGNOSIS — Z7902 Long term (current) use of antithrombotics/antiplatelets: Secondary | ICD-10-CM | POA: Insufficient documentation

## 2013-05-11 DIAGNOSIS — I4891 Unspecified atrial fibrillation: Secondary | ICD-10-CM | POA: Insufficient documentation

## 2013-05-11 DIAGNOSIS — Z950 Presence of cardiac pacemaker: Secondary | ICD-10-CM | POA: Insufficient documentation

## 2013-05-11 DIAGNOSIS — I2510 Atherosclerotic heart disease of native coronary artery without angina pectoris: Secondary | ICD-10-CM | POA: Insufficient documentation

## 2013-05-11 DIAGNOSIS — G5790 Unspecified mononeuropathy of unspecified lower limb: Secondary | ICD-10-CM | POA: Insufficient documentation

## 2013-05-11 DIAGNOSIS — E039 Hypothyroidism, unspecified: Secondary | ICD-10-CM | POA: Insufficient documentation

## 2013-05-11 DIAGNOSIS — Z9889 Other specified postprocedural states: Secondary | ICD-10-CM | POA: Insufficient documentation

## 2013-05-11 DIAGNOSIS — Z79899 Other long term (current) drug therapy: Secondary | ICD-10-CM | POA: Insufficient documentation

## 2013-05-11 DIAGNOSIS — I509 Heart failure, unspecified: Secondary | ICD-10-CM | POA: Insufficient documentation

## 2013-05-11 DIAGNOSIS — Z8619 Personal history of other infectious and parasitic diseases: Secondary | ICD-10-CM | POA: Insufficient documentation

## 2013-05-11 DIAGNOSIS — Z87440 Personal history of urinary (tract) infections: Secondary | ICD-10-CM | POA: Insufficient documentation

## 2013-05-11 DIAGNOSIS — K219 Gastro-esophageal reflux disease without esophagitis: Secondary | ICD-10-CM | POA: Insufficient documentation

## 2013-05-11 DIAGNOSIS — Z880 Allergy status to penicillin: Secondary | ICD-10-CM | POA: Insufficient documentation

## 2013-05-11 DIAGNOSIS — Z7982 Long term (current) use of aspirin: Secondary | ICD-10-CM | POA: Insufficient documentation

## 2013-05-11 DIAGNOSIS — G5792 Unspecified mononeuropathy of left lower limb: Secondary | ICD-10-CM

## 2013-05-11 LAB — BASIC METABOLIC PANEL
BUN: 13 mg/dL (ref 6–23)
CALCIUM: 8.7 mg/dL (ref 8.4–10.5)
CO2: 25 mEq/L (ref 19–32)
CREATININE: 0.8 mg/dL (ref 0.50–1.10)
Chloride: 103 mEq/L (ref 96–112)
GFR calc Af Amer: 73 mL/min — ABNORMAL LOW (ref >90)
GFR, EST NON AFRICAN AMERICAN: 63 mL/min — AB (ref >90)
GLUCOSE: 94 mg/dL (ref 70–99)
Potassium: 4 mEq/L (ref 3.7–5.3)
Sodium: 141 mEq/L (ref 137–147)

## 2013-05-11 LAB — URINALYSIS, ROUTINE W REFLEX MICROSCOPIC
BILIRUBIN URINE: NEGATIVE
Glucose, UA: NEGATIVE mg/dL
HGB URINE DIPSTICK: NEGATIVE
KETONES UR: 15 mg/dL — AB
Leukocytes, UA: NEGATIVE
Nitrite: NEGATIVE
PROTEIN: NEGATIVE mg/dL
Specific Gravity, Urine: 1.022 (ref 1.005–1.030)
UROBILINOGEN UA: 0.2 mg/dL (ref 0.0–1.0)
pH: 6 (ref 5.0–8.0)

## 2013-05-11 LAB — CBC WITH DIFFERENTIAL/PLATELET
Basophils Absolute: 0 10*3/uL (ref 0.0–0.1)
Basophils Relative: 0 % (ref 0–1)
EOS ABS: 0.1 10*3/uL (ref 0.0–0.7)
EOS PCT: 1 % (ref 0–5)
HCT: 32.2 % — ABNORMAL LOW (ref 36.0–46.0)
Hemoglobin: 10.3 g/dL — ABNORMAL LOW (ref 12.0–15.0)
LYMPHS ABS: 0.6 10*3/uL — AB (ref 0.7–4.0)
Lymphocytes Relative: 8 % — ABNORMAL LOW (ref 12–46)
MCH: 26.5 pg (ref 26.0–34.0)
MCHC: 32 g/dL (ref 30.0–36.0)
MCV: 83 fL (ref 78.0–100.0)
MONO ABS: 0.8 10*3/uL (ref 0.1–1.0)
Monocytes Relative: 10 % (ref 3–12)
Neutro Abs: 6.4 10*3/uL (ref 1.7–7.7)
Neutrophils Relative %: 81 % — ABNORMAL HIGH (ref 43–77)
PLATELETS: 197 10*3/uL (ref 150–400)
RBC: 3.88 MIL/uL (ref 3.87–5.11)
RDW: 18 % — ABNORMAL HIGH (ref 11.5–15.5)
WBC: 7.9 10*3/uL (ref 4.0–10.5)

## 2013-05-11 MED ORDER — FENTANYL CITRATE 0.05 MG/ML IJ SOLN
50.0000 ug | Freq: Once | INTRAMUSCULAR | Status: AC
  Administered 2013-05-11: 50 ug via INTRAVENOUS
  Filled 2013-05-11: qty 2

## 2013-05-11 MED ORDER — TRAMADOL HCL 50 MG PO TABS
50.0000 mg | ORAL_TABLET | Freq: Once | ORAL | Status: AC
  Administered 2013-05-11: 50 mg via ORAL
  Filled 2013-05-11: qty 1

## 2013-05-11 MED ORDER — GABAPENTIN 100 MG PO CAPS: 100.0000 mg | ORAL_CAPSULE | Freq: Every day | ORAL | Status: DC

## 2013-05-11 MED ORDER — GABAPENTIN 100 MG PO CAPS
100.0000 mg | ORAL_CAPSULE | ORAL | Status: AC
  Administered 2013-05-11: 100 mg via ORAL
  Filled 2013-05-11: qty 1

## 2013-05-11 MED ORDER — SODIUM CHLORIDE 0.9 % IV BOLUS (SEPSIS)
1000.0000 mL | Freq: Once | INTRAVENOUS | Status: AC
  Administered 2013-05-11: 1000 mL via INTRAVENOUS

## 2013-05-11 NOTE — Evaluation (Signed)
Physical Therapy Evaluation Patient Details Name: [name] Luna MRN: [id number] DOB: Jul 30, 1920 Today's Date: 05/11/2013 Time: [id number] PT Time Calculation (min): 28 min  PT Assessment / Plan / Recommendation History of Present Illness  Pt is a 80 y.o. female adm due to Lt LE radiating pain that was limiting mobility at home. Son called EMS due to inability to transfer pt safely.  Clinical Impression  Pt brought to the emergency department due to the above. Pt was brought to ED last week by son (who is her caregiver) for the same complaint. Pt greatly limited in mobility throughout evaluation due to pain. Required 2 person (A) for all mobility and was screaming in pain throughout session. Had lengthy discussion with pt and son regarding plan of care. Son is unable to provide the amount of (A) pt needs at this time. Recommend SNF at this time for D/C disposition due to limitations in mobility. CSW notified.     PT Assessment  Patient needs continued PT services    Follow Up Recommendations  Supervision/Assistance - 24 hour;SNF    Does the patient have the potential to tolerate intense rehabilitation      Barriers to Discharge Decreased caregiver support      Equipment Recommendations       Recommendations for Other Services OT consult   Frequency Min 3X/week    Precautions / Restrictions Precautions Precautions: Fall Restrictions Weight Bearing Restrictions: No   Pertinent Vitals/Pain 10/10; pt screaming in pain throughout session; c/o "bladder pain" RN and MD notified.     Mobility  Bed Mobility Overal bed mobility: +2 for physical assistance;Needs Assistance Bed Mobility: Supine to Sit;Sit to Supine Supine to sit: +2 for safety/equipment;+2 for physical assistance;Min assist Sit to supine: +2 for physical assistance;Mod assist;+2 for safety/equipment General bed mobility comments: pt requires incr time and max encouragement due to pain; requires (A) to bring LEs off  and onto EOB; pt screaming in pain during bed mobility; (A) to elevate shoulders to sitting position  Transfers Overall transfer level: Needs assistance Equipment used: 2 person hand held assist Transfers: Sit to/from Stand Sit to Stand: +2 safety/equipment;+2 physical assistance;Mod assist General transfer comment:  cues for upright posture and sequencing; pt greatly limited by pain and demo buckling of Lt LE with standing; 2 person (A) to maintain balance due to pain  Ambulation/Gait Ambulation/Gait assistance: +2 safety/equipment;Min assist Ambulation Distance (Feet): 8 Feet Assistive device: 2 person hand held assist Gait Pattern/deviations: Decreased step length - right;Decreased stance time - left;Antalgic;Wide base of support;Decreased weight shift to left Gait velocity interpretation: <1.8 ft/sec, indicative of risk for recurrent falls General Gait Details: pt greatly limited with mobiltiy secondary to pain; requries 2 person (A) to maintain balance; Lt LE buckling; pt screaming in pain; cues for sequencing and safety     Exercises Total Joint Exercises Ankle Circles/Pumps: AROM;Supine;Both;10 reps Long Arc Quad: AROM;Left;10 reps;Other (comment)   PT Diagnosis: Difficulty walking;Generalized weakness;Acute pain  PT Problem List: Decreased strength;Decreased activity tolerance;Decreased balance;Decreased mobility;Impaired sensation;Decreased safety awareness;Pain;Decreased knowledge of use of DME PT Treatment Interventions: DME instruction;Gait training;Functional mobility training;Therapeutic activities;Therapeutic exercise;Balance training;Neuromuscular re-education;Patient/family education     PT Goals(Current goals can be found in the care plan section) Acute Rehab PT Goals PT Goal Formulation: With patient/family Time For Goal Achievement: 05/25/13 Potential to Achieve Goals: Good  Visit Information  Last PT Received On: 05/11/13 Assistance Needed: +1 History of Present  Illness: Pt is a 80 y.o. female adm due to Lt LE radiating  pain that was limiting mobility at home. Son called EMS due to inability to transfer pt safely.       Prior Functioning  Home Living Family/patient expects to be discharged to:: Skilled nursing facility Prior Function Level of Independence: Needs assistance Gait / Transfers Assistance Needed: son has been having to progressively help her more and more with mobility; was using RW  ADL's / Homemaking Assistance Needed: requires setup and (A) for ADLs for safety; son reports due to macular degeneration pt has been having difficulty feeding herself  Comments: Son has been providing 24/7 care for pt; reports he would prefer her go to facility at this time due to increased (A) needed recently  Communication Communication: No difficulties Dominant Hand: Right    Cognition  Cognition Arousal/Alertness: Awake/alert Behavior During Therapy: Anxious Overall Cognitive Status: Within Functional Limits for tasks assessed    Extremity/Trunk Assessment Upper Extremity Assessment Upper Extremity Assessment: Generalized weakness Lower Extremity Assessment Lower Extremity Assessment: Generalized weakness;LLE deficits/detail LLE Deficits / Details: grossly 3+/5 in knee; unable to assess hip due to pain; c/o "numbness/tingling"  LLE: Unable to fully assess due to pain LLE Sensation: decreased light touch Cervical / Trunk Assessment Cervical / Trunk Assessment: Normal   Balance Balance Overall balance assessment: Needs assistance Sitting-balance support: Bilateral upper extremity supported Sitting balance-Leahy Scale: Poor Sitting balance - Comments: tolerated sitting EOB ~4 min; limited by pain with sitting and reaching for support to maintain balance; guarded and decreased weightshift to Left  Postural control: Posterior lean Standing balance support: During functional activity;Bilateral upper extremity supported Standing balance-Leahy  Scale: Poor Standing balance comment: bil UE supported by 2 person handheld (A)  End of Session PT - End of Session Equipment Utilized During Treatment: Gait belt Activity Tolerance: Patient limited by pain Patient left: in bed;with call bell/phone within reach;with family/visitor present Nurse Communication: Mobility status;Patient requests pain meds;Other (comment)  GP Functional Limitation: Mobility: Walking and moving around Mobility: Walking and Moving Around Current Status [phone]): At least 80 percent but less than 100 percent impaired, limited or restricted Mobility: Walking and Moving Around Goal Status [phone]): At least 1 percent but less than 20 percent impaired, limited or restricted   Donell Sievert, [place] [phone] 05/11/2013, 1:58 PM

## 2013-05-11 NOTE — Telephone Encounter (Signed)
PA done verbally.  They will call within 72 hours with a decision.  # for PA is [phone] opt. 4.

## 2013-05-11 NOTE — ED Provider Notes (Signed)
CSN: [id number]     Arrival date & time 05/11/13  0808 History   First MD Initiated Contact with Patient 05/11/13 [phone]     Chief Complaint  Patient presents with  . Leg Pain   (Consider location/radiation/quality/duration/timing/severity/associated sxs/prior Treatment) Patient is a 80 y.o. female presenting with leg pain.  Leg Pain  Pt with multiple medical problems brought to the ED by EMS from home for evaluation of L leg pain. She has had about 2 weeks of gradually worsening pain in L anterior thigh more severe over the last several days. No known injury, rash or swelling. She was seen in the ED for same 5 days ago and had neg xray imaging and [place].  She was given percocet, which she has not been taking much because it 'makes her feel funny'. She did have a dose during the night, but was in so much pain, unable to walk this AM.   Past Medical History  Diagnosis Date  . CHF (congestive heart failure)   . A-fib   . Hypothyroid   . GERD (gastroesophageal reflux disease)   . Hypothyroidism   . Bradycardia   . Pancreatitis   . Coronary artery disease   . Shingles 1980's    "twice; once on my front side; once on my back" (02/07/2012)  . CORONARY ARTERY DISEASE 11/28/2006    Qualifier: Diagnosis of  By: Tawanna Cooler MD, Eugenio Hoes   . Pacemaker   . Paroxysmal a-fib, history of 08/02/2011  . Tremor, essential 08/02/2011  . Esophageal disorder, with history of dilatation 08/02/2011  . Angina   . H/O hiatal hernia   . Neuromuscular disorder     essential tremors  . UTI (lower urinary tract infection) 08/03/2011  . Dizziness, near syncope 11/07/2011  . Macular degeneration, dry     "both eyes" (02/07/2012)  . Hypertension   . HTN (hypertension), accelerated this admit 11/08/2011  . Pneumonia 1927  . Syncope    Past Surgical History  Procedure Laterality Date  . Repair spigelian hernia    . S/p childbirth      x2  . Cataract extraction w/ intraocular lens  implant, bilateral  ~ 1997  . Vaginal  hysterectomy  1970's  . Left oophorectomy  1970's  . Insert / replace / remove pacemaker  2000; 2009    initial placement; replaced  . Appendectomy  1933  . Cholecystectomy  1950's  . Hernia repair    . Esophageal dilation      "once" (02/07/2012)  . Cardiac catheterization  12/24/2005    No intervention - recommend medical therapy  . Cardiac catheterization  10/13/2006    80% proximal LAD stenosis, stented w/ a 3.5x81mm Cypher stent at 14atm (3.72mm), resulting in reduction of 80% to 0% residual.  . Cardiac catheterization  11/14/2006    No intervention - recommend medical therapy  . Cardiac catheterization  07/24/2007    No intervention.  . Cardiac catheterization  01/16/2010    75% proximal LAD stenosis w/ in-stent restenosis. No intervention. Dr Allyson Sabal will intervene on proximal LAD lesion.  . Cardiac catheterization  01/16/2010    75% proximal LAD stenosis, stented w/ a 3x25mm Promus DES at 16atm, resulting in reduction 75% to 0% residual  . Cardioversion  07/11/2010    Successful conversion to V paced verified via pacemaker.  . Cardiac catheterization  09/08/2010    LAD 75% in-stent restenosis, stented w/ a 3x33mm Resolute DES at 16atm (3.12mm), resulting in reduction of 75% stenosis  .  Renal doppler  12/12/2010    Bilat proximal renal 1-59% diameter reduction. Bilat kidneys are normal.  . Cardiac catheterization  12/22/2010    No intervention - recommend medical therapy.  . Cardiovascular stress test  08/09/2011    No scintigraphic evidence of inducible myocardial ischemia. Non-diagnostic for ischemia.  . Transthoracic echocardiogram  01/21/2012    EF 55-60%, mild-moderate tricuspid regurg.   Family History  Problem Relation Age of Onset  . Sudden death Mother    History  Substance Use Topics  . Smoking status: Never Smoker   . Smokeless tobacco: Never Used  . Alcohol Use: No   OB History   Grav Para Term Preterm Abortions TAB SAB Ect Mult Living                 Review of  Systems All other systems reviewed and are negative except as noted in HPI.   Allergies  Codeine; Isosorbide mononitrate; Lorazepam; Penicillins; Imdur; Prednisone; and Valium  Home Medications   Current Outpatient Rx  Name  Route  Sig  Dispense  Refill  . amiodarone (PACERONE) 200 MG tablet   Oral   Take 1 tablet (200 mg total) by mouth daily.   30 tablet   11   . aspirin 81 MG EC tablet   Oral   Take 81 mg by mouth daily.           . clopidogrel (PLAVIX) 75 MG tablet   Oral   Take 75 mg by mouth daily.           [name] levothyroxine (SYNTHROID, LEVOTHROID) 100 MCG tablet   Oral   Take 100 mcg by mouth daily before breakfast.         . loratadine (CLARITIN) 10 MG tablet   Oral   Take 10 mg by mouth daily.           [name] losartan (COZAAR) 50 MG tablet   Oral   Take 50 mg by mouth 2 (two) times daily.         . metoprolol (LOPRESSOR) 50 MG tablet   Oral   Take 50 mg by mouth at bedtime.         . midodrine (PROAMATINE) 2.5 MG tablet   Oral   Take 2.5 mg by mouth daily before breakfast.         . Multiple Vitamins-Minerals (PRESERVISION AREDS 2) CAPS   Oral   Take 1 capsule by mouth daily.          . nitroGLYCERIN (NITROSTAT) 0.4 MG SL tablet   Sublingual   Place 0.4 mg under the tongue every 5 (five) minutes as needed for chest pain. For chest pain         . oxyCODONE-acetaminophen (PERCOCET/ROXICET) 5-325 MG per tablet   Oral   Take 1 tablet by mouth every 6 (six) hours as needed for severe pain.   15 tablet   0   . pantoprazole (PROTONIX) 40 MG tablet   Oral   Take 40 mg by mouth 2 (two) times daily.         . polyethylene glycol (MIRALAX / GLYCOLAX) packet   Oral   Take 17 g by mouth every other day. For constipation         . ranolazine (RANEXA) 500 MG 12 hr tablet   Oral   Take 500 mg by mouth daily.          . simvastatin (ZOCOR) 20 MG tablet  Oral   Take 1 tablet (20 mg total) by mouth at bedtime.   90 tablet   3   .  traMADol (ULTRAM) 50 MG tablet   Oral   Take 50 mg by mouth at bedtime.         [name] trimethoprim (TRIMPEX) 100 MG tablet   Oral   Take 100 mg by mouth at bedtime.          BP 183/62  Pulse 72  Temp(Src) 97.4 F (36.3 C) (Oral)  Resp 16  Ht 5\' 2"  (1.575 m)  Wt 158 lb (71.668 kg)  BMI 28.89 kg/m2  SpO2 98% Physical Exam  Nursing note and vitals reviewed. Constitutional: She is oriented to person, place, and time. She appears well-developed and well-nourished.  HENT:  Head: Normocephalic and atraumatic.  Eyes: EOM are normal. Pupils are equal, round, and reactive to light.  Neck: Normal range of motion. Neck supple.  Cardiovascular: Normal rate, normal heart sounds and intact distal pulses.   Pulmonary/Chest: Effort normal and breath sounds normal.  Abdominal: Bowel sounds are normal. She exhibits no distension. There is no tenderness.  Musculoskeletal: Normal range of motion. She exhibits tenderness (tender to light touch of anterior thigh). She exhibits no edema.  Neurological: She is alert and oriented to person, place, and time. She has normal strength. No cranial nerve deficit or sensory deficit.  Skin: Skin is warm and dry. No rash noted.  Psychiatric: She has a normal mood and affect.    ED Course  Procedures (including critical care time) Labs Review Labs Reviewed  CBC WITH DIFFERENTIAL - Abnormal; Notable for the following:    Hemoglobin 10.3 (*)    HCT 32.2 (*)    RDW 18.0 (*)    Neutrophils Relative % 81 (*)    Lymphocytes Relative 8 (*)    Lymphs Abs 0.6 (*)    All other components within normal limits  BASIC METABOLIC PANEL - Abnormal; Notable for the following:    GFR calc non Af Amer 63 (*)    GFR calc Af Amer 73 (*)    All other components within normal limits  URINALYSIS, ROUTINE W REFLEX MICROSCOPIC - Abnormal; Notable for the following:    Ketones, ur 15 (*)    All other components within normal limits   Imaging Review No results found.  EKG  Interpretation   None       MDM   1. Neuropathy of left thigh     Anterior thigh tender to light touch, no deep tissue soreness. Suspect this is neuropathic pain from ?early shingles vs meralgia paresthetica. Discussed with her PCP who recommends starting low dose Neurontin, return to Tramadol instead of Percocet and close followup in office this week.   12:08 PM On re-eval, pt still having considerable pain with slight movement of L leg. Son is not comfortable taking her home. Discussed with him options for admission, placement, home health etc. Will check basic labs and UA to see if there is a cause which might benefit from admission. PT consulted to eval. She already has walker, wheelchair, cane, bedside commode etc at home. Has used [facility] in the past for other mobility issues but no longer active. Care Manager and Social Work involved as well.   3:26 PM Labs unremarkable, no indication for medical admission to the hospital. Case Management and Social Worker have been involved as well. After long discussion with son regarding availability of home health he will continue to care  for her at home with assistance of Home Health pending SNF placement through outpatient channels.   Yidel Teuscher B. Bernette MayersSheldon, MD 05/11/13 [phone]

## 2013-05-11 NOTE — ED Notes (Addendum)
PT reports pain in L leg radiating from top of thigh down to foot. PT reports pain in joints and tenderness to the touch. Reports having U/S & CT ~week ago for same problem and both results negative. PT came in via [facility] today for increased pain to L leg. PT was given pain medicine last week, but unable to take because it makes her feel "funny".

## 2013-05-11 NOTE — Progress Notes (Signed)
CSW consult to pt regarding potential placement vs discharge home. CSW spoke with pt.'s son, Beau FannyLarry Mchatton and explained to  him the process of SNF placement. Since [facility], CSW called and spoke with Revonda StandardAllison, Admission Coordinator [facility]). AC shared with CSW since pt does not have a 3 day, 3 night qualifying stay which is a Medicare requirement pt would be a private pay costing $273 per day and at this point in February it would be $5,460 up front. CSW shared with pt.'s son, that private pay would be expected until Medicare benefit takes effect with a 3 day, 3 night stay. Pt.'s son voiced understanding. CSW and CM, Anola GurneyWanda Rogers shared information with pt.'s son that home health would also be an option and that benefit would be covered with the Medicare benefit. This benefit would include a home health nurse, home health aide, PT and a Child psychotherapistsocial worker. Pt.'s son voiced understanding and has made a decision to go with [facility]. Pt.'s son will schedule follow up visit with PCP. Will be transported by [facility].    [address]Benedetto Ryder, [place] [phone]

## 2013-05-11 NOTE — ED Notes (Signed)
Case management at bedside.

## 2013-05-11 NOTE — Progress Notes (Signed)
Case Manager consult for potential home health.CM met patient and son Fritz Pickerel at bedside.Patients ED visit today is secondary to Left leg pain which has increased over  The past few days.Fritz Pickerel reports he was unable to assist his mother into the car and activated 911 to come to ED.Fritz Pickerel reports he resides at home with his mother and he  Usually manages her needs.CM educated Fritz Pickerel /patient on plan of Care and Pending PT bedside evaluation of patient.CM provided education on Home health Services.  Patient and Son provided CHOICE list.Teach back method used to ensure Patient/ Son understanding.Await PT.

## 2013-05-11 NOTE — Discharge Instructions (Signed)

## 2013-05-11 NOTE — Progress Notes (Addendum)
[facility] CM requested to meet with patient by D. Best CSW [facility] Pt presented to ED via EMS for groin pain. Pt was seen in ED for similar c/o on 2/4. Pt has not had f/u with PCP following ED discharge. Pt lives at home with son caregiver Peyton NajjarLarry. Discussed with patient and family regarding recommendations by Dr. Bernette [facility]Sheldon [facility]. Patient and son verbalized understanding and is agreement with plan.  Pt has list and was given choice, [facility] elected. [facility],PT, SW, and [facility]. Pt also given list for Private Duty sitters and explained that family would be responsible for hiring someone privately. Patient and family verbalized understanding, teach back method used. Expl[facility] that [facility] patient and son with 24-48 hour. Provided [facility] broch[facility] with contact number to notify if they don't hear from someone within 24-48 hours. Verified address and number in record with son.No further ED CM needs identified. Provided ED CM number if any further concerns or questions arises.

## 2013-05-11 NOTE — Progress Notes (Signed)
Upon reviewing Home health resource list [name] Luna ( Patients son) elects [facility] for his mothers home health needs.[name] Luna reports they Live in a one level home and have a wheel chair,walker,bedside commode and a life line necklace. This [facility] to inform her  Of potential new home health referral.Will continue to monitor patients discharge plan and provide family / patient support education.

## 2013-05-14 ENCOUNTER — Ambulatory Visit (INDEPENDENT_AMBULATORY_CARE_PROVIDER_SITE_OTHER): Payer: Medicare Other | Admitting: Family Medicine

## 2013-05-14 ENCOUNTER — Telehealth: Payer: Self-pay | Admitting: Cardiovascular Disease

## 2013-05-14 ENCOUNTER — Encounter: Payer: Self-pay | Admitting: Family Medicine

## 2013-05-14 VITALS — BP 148/90 | HR 72 | Temp 98.1°F

## 2013-05-14 DIAGNOSIS — M25559 Pain in unspecified hip: Secondary | ICD-10-CM

## 2013-05-14 DIAGNOSIS — M25552 Pain in left hip: Secondary | ICD-10-CM

## 2013-05-14 MED ORDER — HYDROCODONE-ACETAMINOPHEN 7.5-300 MG PO TABS: ORAL_TABLET | ORAL | Status: DC

## 2013-05-14 NOTE — Telephone Encounter (Signed)
Forwarded to Barbara, CMA.  

## 2013-05-14 NOTE — Telephone Encounter (Signed)
Pantoprazole was approved for 05-11-13 thru 05-11-14 for quanity limit.

## 2013-05-14 NOTE — Progress Notes (Signed)
Pre visit review using our clinic review tool, if applicable. No additional management support is needed unless otherwise documented below in the visit note. 

## 2013-05-14 NOTE — Patient Instructions (Signed)
Call Dr. Norlene CampbellPeter Whitfield and group [phone] and tell them that I referred you and we would like your mother seen tomorrow  Vicodin,,,,,,,,,, one half tab every 4-6 hours for severe pain

## 2013-05-14 NOTE — Telephone Encounter (Signed)
Son notified Pantoprazole was approved x 1 year for bid dosing.

## 2013-05-14 NOTE — Progress Notes (Signed)
   Subjective:    Patient ID: [name] Luna, female    DOB: 02-12-1921, 80 y.o.   MRN: [id number]  HPI [name] is a 80 year old female who comes in today accompanied by her son,,,,,,,,,, who is her [address] caregiver at home,,,,,,, for evaluation of pain in her left hip  About 2 weeks ago she began having discomfort in her left hip. Her pain was so bad that he took her to the emergency room on February 4. They did a number diagnostic studies all of which looked fairly normal except for some degenerative change of her left femoral head. They gave her some Percocet ???????????? anyway gave her one or 2 because it caused severe lethargy. He then took her back to emerge from this week as her pain was no better and she couldn't sleep at night. They gave her Neurontin and sent her to see us for followup.  When all this happened 2 weeks ago he noticed that she could no longer walk without severe pain  She points to her left hip as a source of her pain   Review of Systems    review of systems otherwise negative Objective:   Physical Exam  Well-developed well-nourished female no acute distress severe pain left hip      Assessment & Plan:  Left ear pain referred orthopedist for immediate evaluation

## 2013-05-18 ENCOUNTER — Encounter: Payer: Self-pay | Admitting: Family Medicine

## 2013-05-19 ENCOUNTER — Encounter: Payer: Self-pay | Admitting: Family Medicine

## 2013-05-20 ENCOUNTER — Telehealth: Payer: Self-pay | Admitting: Family Medicine

## 2013-05-20 ENCOUNTER — Ambulatory Visit
Admission: RE | Admit: 2013-05-20 | Discharge: 2013-05-20 | Disposition: A | Discharge status: Home / Self Care | Payer: Medicare Other | Source: Ambulatory Visit | Attending: Orthopaedic Surgery | Admitting: Orthopaedic Surgery

## 2013-05-20 ENCOUNTER — Other Ambulatory Visit: Payer: Self-pay | Admitting: Orthopaedic Surgery

## 2013-05-20 DIAGNOSIS — M533 Sacrococcygeal disorders, not elsewhere classified: Secondary | ICD-10-CM

## 2013-05-20 DIAGNOSIS — R1030 Lower abdominal pain, unspecified: Secondary | ICD-10-CM

## 2013-05-20 NOTE — Telephone Encounter (Signed)
Patient Information:  Caller Name: [name] Luna  Phone: [phone]  Patient: [name] Luna, [name] Luna  Gender: Female  DOB: [birth year]  Age: 80 Years  PCP: Kelle Dartingodd, Jeffrey [facility](Family Practice)  Office Follow Up:  Does the office need to follow up with this patient?: Yes  Instructions For The Office: Please call ASAP regarding disposition.  RN Note:  Left foot has some areas of discoloration. Declined office visit at 1200 05/20/13 due to home is at least one hour from office, difficulty getting patient dressed, out of home and into car and icy roads.  Doy HutchingSaundrea contacted regarding ED or see now disoposition and unable to get to office for appointment.  Requested triage note be sent to office for MD to review and office call back.  Symptoms  Reason For Call & Symptoms: Ongoing Left leg pain with new onset of swellling of feet, ankles and lower legs.  Left leg is more edematous than right.  Purple bruises on posterior left leg.  Reviewed Health History In EMR: Yes  Reviewed Medications In EMR: Yes  Reviewed Allergies In EMR: Yes  Reviewed Surgeries / Procedures: Yes  Date of Onset of Symptoms: 05/16/2013  Treatments Tried: ED visits 2/4 and 05/11/13, orthopedic evaluation, xrays  Treatments Tried Worked: No  Guideline(s) Used:  Leg Swelling and Edema  Disposition Per Guideline:   Go to ED Now (or to Office with PCP Approval)  Reason For Disposition Reached:   Thigh or calf pain and only 1 side and present > 1 hour  Advice Given:  N/A  RN Overrode Recommendation:  Follow Up With Office Later  Office to call back regarding disposition

## 2013-05-20 NOTE — Telephone Encounter (Signed)
Patient should get further instructions from ortho.

## 2013-05-20 NOTE — Telephone Encounter (Signed)
Called and spoke with

## 2013-05-20 NOTE — Telephone Encounter (Signed)
Spoke with son and per Dr Tawanna Coolerodd son should call the ortho doctor for further evaluation.

## 2013-05-20 NOTE — Telephone Encounter (Signed)
Called and spoke with son and per Dr Tawanna Coolerodd he should call ortho for further instruction.

## 2013-05-21 ENCOUNTER — Telehealth: Payer: Self-pay | Admitting: *Deleted

## 2013-05-21 NOTE — Telephone Encounter (Signed)
[name] Luna is calling for CT results.  Patient is in a lot of pain, 3 plus pitting edema, bruising. Per Dr Tawanna Coolerodd son can call ortho or take patient to ER.  Son is aware. Spoke with [name] Luna.

## 2013-05-22 ENCOUNTER — Telehealth: Payer: Self-pay | Admitting: Cardiovascular Disease

## 2013-05-22 ENCOUNTER — Encounter (HOSPITAL_COMMUNITY): Payer: Self-pay | Admitting: Emergency Medicine

## 2013-05-22 ENCOUNTER — Telehealth: Payer: Self-pay | Admitting: Family Medicine

## 2013-05-22 ENCOUNTER — Emergency Department (HOSPITAL_COMMUNITY)
Admission: EM | Admit: 2013-05-22 | Discharge: 2013-05-22 | Disposition: A | Discharge status: Home / Self Care | Payer: Medicare Other | Attending: Emergency Medicine | Admitting: Emergency Medicine

## 2013-05-22 DIAGNOSIS — K219 Gastro-esophageal reflux disease without esophagitis: Secondary | ICD-10-CM | POA: Insufficient documentation

## 2013-05-22 DIAGNOSIS — Z7902 Long term (current) use of antithrombotics/antiplatelets: Secondary | ICD-10-CM | POA: Insufficient documentation

## 2013-05-22 DIAGNOSIS — Z7982 Long term (current) use of aspirin: Secondary | ICD-10-CM | POA: Insufficient documentation

## 2013-05-22 DIAGNOSIS — Z880 Allergy status to penicillin: Secondary | ICD-10-CM | POA: Insufficient documentation

## 2013-05-22 DIAGNOSIS — Z8619 Personal history of other infectious and parasitic diseases: Secondary | ICD-10-CM | POA: Insufficient documentation

## 2013-05-22 DIAGNOSIS — S7010XA Contusion of unspecified thigh, initial encounter: Secondary | ICD-10-CM

## 2013-05-22 DIAGNOSIS — I509 Heart failure, unspecified: Secondary | ICD-10-CM | POA: Insufficient documentation

## 2013-05-22 DIAGNOSIS — I10 Essential (primary) hypertension: Secondary | ICD-10-CM | POA: Insufficient documentation

## 2013-05-22 DIAGNOSIS — M7981 Nontraumatic hematoma of soft tissue: Secondary | ICD-10-CM | POA: Insufficient documentation

## 2013-05-22 DIAGNOSIS — Z79899 Other long term (current) drug therapy: Secondary | ICD-10-CM | POA: Insufficient documentation

## 2013-05-22 DIAGNOSIS — Z8669 Personal history of other diseases of the nervous system and sense organs: Secondary | ICD-10-CM | POA: Insufficient documentation

## 2013-05-22 DIAGNOSIS — Z9889 Other specified postprocedural states: Secondary | ICD-10-CM | POA: Insufficient documentation

## 2013-05-22 DIAGNOSIS — E039 Hypothyroidism, unspecified: Secondary | ICD-10-CM | POA: Insufficient documentation

## 2013-05-22 DIAGNOSIS — Z87440 Personal history of urinary (tract) infections: Secondary | ICD-10-CM | POA: Insufficient documentation

## 2013-05-22 DIAGNOSIS — I2510 Atherosclerotic heart disease of native coronary artery without angina pectoris: Secondary | ICD-10-CM | POA: Insufficient documentation

## 2013-05-22 DIAGNOSIS — M25559 Pain in unspecified hip: Secondary | ICD-10-CM | POA: Insufficient documentation

## 2013-05-22 DIAGNOSIS — Z950 Presence of cardiac pacemaker: Secondary | ICD-10-CM | POA: Insufficient documentation

## 2013-05-22 DIAGNOSIS — Z8701 Personal history of pneumonia (recurrent): Secondary | ICD-10-CM | POA: Insufficient documentation

## 2013-05-22 LAB — CBC
HCT: 33.2 % — ABNORMAL LOW (ref 36.0–46.0)
Hemoglobin: 10.3 g/dL — ABNORMAL LOW (ref 12.0–15.0)
MCH: 26.4 pg (ref 26.0–34.0)
MCHC: 31 g/dL (ref 30.0–36.0)
MCV: 85.1 fL (ref 78.0–100.0)
PLATELETS: 292 10*3/uL (ref 150–400)
RBC: 3.9 MIL/uL (ref 3.87–5.11)
RDW: 19.2 % — AB (ref 11.5–15.5)
WBC: 7.5 10*3/uL (ref 4.0–10.5)

## 2013-05-22 LAB — COMPREHENSIVE METABOLIC PANEL
ALBUMIN: 3.6 g/dL (ref 3.5–5.2)
ALT: 22 U/L (ref 0–35)
AST: 30 U/L (ref 0–37)
Alkaline Phosphatase: 215 U/L — ABNORMAL HIGH (ref 39–117)
BUN: 13 mg/dL (ref 6–23)
CALCIUM: 9 mg/dL (ref 8.4–10.5)
CO2: 23 mEq/L (ref 19–32)
CREATININE: 0.81 mg/dL (ref 0.50–1.10)
Chloride: 100 mEq/L (ref 96–112)
GFR calc Af Amer: 72 mL/min — ABNORMAL LOW (ref >90)
GFR calc non Af Amer: 62 mL/min — ABNORMAL LOW (ref >90)
Glucose, Bld: 105 mg/dL — ABNORMAL HIGH (ref 70–99)
Potassium: 3.9 mEq/L (ref 3.7–5.3)
Sodium: 138 mEq/L (ref 137–147)
Total Bilirubin: 0.5 mg/dL (ref 0.3–1.2)
Total Protein: 6.9 g/dL (ref 6.0–8.3)

## 2013-05-22 MED ORDER — MORPHINE SULFATE 2 MG/ML IJ SOLN
2.0000 mg | Freq: Once | INTRAMUSCULAR | Status: AC
  Administered 2013-05-22: 2 mg via INTRAVENOUS
  Filled 2013-05-22: qty 1

## 2013-05-22 MED ORDER — ONDANSETRON HCL 4 MG/2ML IJ SOLN
4.0000 mg | Freq: Once | INTRAMUSCULAR | Status: AC
  Administered 2013-05-22: 4 mg via INTRAVENOUS
  Filled 2013-05-22: qty 2

## 2013-05-22 MED ORDER — SODIUM CHLORIDE 0.9 % IV SOLN
INTRAVENOUS | Status: DC
  Administered 2013-05-22: 19:00:00 via INTRAVENOUS

## 2013-05-22 NOTE — ED Notes (Signed)
The pt has had lt leg pain for 2-3 months and she had a c-t scan  Wednesday and today she was seen in a doctors office and told to come here to be admitted.  The husband does not know what she is to be admitted for.  No fall just pain and she has been seen in the ed for the same

## 2013-05-22 NOTE — Telephone Encounter (Signed)
Returned call and pt verified x 2 w/ Peyton NajjarLarry, pt's son.  Stated pt has been having a problem w/ pain in her L leg for the past couple of weeks.  Stated she's been to ER a couple of times and PCP.  Sent to orthopaedics and pt had CT scan.  Stated they received a call that pt has a hematoma on her L hip r/t a possible muscle tear.  Stated pt w/ L foot/leg pain and swelling.  Stated he called orthopaedics and they told him to call cardiology about her Plavix and ASA.  Son informed MD will be notified to find out if pt can stop Plavix and ASA and advised he f/u with PCP or Orthopaedics r/t plan to treat hematoma.  Son stated pt's PCP is out of the office today and will be out all of next week.  RN advised he call back and talk w/ one of the other providers in the practice to advise on what pt needs to do.  Verbalized understanding.  Dr. Rennis GoldenHilty (DOD) notified and advised pt CAN hold Plavix and ASA.  Agreed w/ RN's plan for pt to f/u with ortho or PCP r/t plan.    Returned call and informed son per instructions by MD.  Also advised if pt in severe pain or has severe swelling to take her to ER now for evaluation.  Son verbalized understanding and agreed w/ plan.  Stated he will start w/ calling PCP's office.  Message forwarded to Dr. Royann Shiversroitoru [facility](FYI).

## 2013-05-22 NOTE — Telephone Encounter (Signed)
Agree with stopping the aspirin and Plavix, at least temporarily.

## 2013-05-22 NOTE — Telephone Encounter (Signed)
Left message to call back  

## 2013-05-22 NOTE — Telephone Encounter (Signed)
Patient Information:  Caller Name: [name] Luna Luna  Phone: [phone]  Patient: [name] Luna Luna, [name] Luna  Gender: Female  DOB: 1921-01-18  Age: 80 Years  PCP: Kelle Dartingodd, Jeffrey [facility](Family Practice)  Office Follow Up:  Does the office need to follow up with this patient?: Yes  Instructions For The Office: Please call.  RN Note:  Pt had a CT scan per Ortho([facility])on 05/20/13 Results shows she has hematoma in her L hip with a possible muscle tear. Orthopedist advised to call PCP for FU. She still has increased edema and pain in that leg. Plavix and ASA have been dc'd per her cardiologist.   Symptoms  Reason For Call & Symptoms: Hip pain  Reviewed Health History In EMR: Yes  Reviewed Medications In EMR: Yes  Reviewed Allergies In EMR: Yes  Reviewed Surgeries / Procedures: Yes  Date of Onset of Symptoms: 05/06/2013  Guideline(s) Used:  Leg Swelling and Edema  Disposition Per Guideline:   See Today in Office  Reason For Disposition Reached:   Patient wants to be seen  Advice Given:  Call Back If:  You become worse.  RN Overrode Recommendation:  Document Patient  Son has great difficulty getting pt up and into a car. He ? if she needs to be admitted or seen in the office.

## 2013-05-22 NOTE — Telephone Encounter (Signed)
Spoke with son and he will take patient to ER.  He will see if he can get a neighbor to help get [name] Luna in the car

## 2013-05-22 NOTE — Telephone Encounter (Signed)
Please call,pt had a Ct Scan and found out she has a hematoma in her back.He was told  to call and talk to Dr C or his nurse.t

## 2013-05-22 NOTE — ED Provider Notes (Signed)
CSN: [id number]     Arrival date & time 05/22/13  1548 History   First MD Initiated Contact with Patient 05/22/13 1824     Chief Complaint  Patient presents with  . Leg Pain     (Consider location/radiation/quality/duration/timing/severity/associated sxs/prior Treatment) Patient is a 80 y.o. female presenting with leg pain. The history is provided by the patient and a relative.  Leg Pain Associated symptoms: no back pain, no fever and no neck pain   for the past month, pt c/o left hip and leg pain. Constant. Dull. Moderate-severe. Worse w movement of hip and/or leg, worse w standing/walking. Pt states had ct scan 2/18, and called pts pcp, orthopedist and card today, and states was told to stop asa and plavix.  pts son indicates when talked to pcp office, they also told to bring to ED.  States pain constant since onset, no acute or abrupt worsening today. Is taking 1/2 a vicodin at a time for pain w mild relief. States bil legs chronically swollen, no acute or abrupt worsening. No leg erythema. No numbness/weakness. No preceding hx trauma or fall. No fever or chills. Normal appetite. Lives w son, has bedside commode.     Past Medical History  Diagnosis Date  . CHF (congestive heart failure)   . A-fib   . Hypothyroid   . GERD (gastroesophageal reflux disease)   . Hypothyroidism   . Bradycardia   . Pancreatitis   . Coronary artery disease   . Shingles 1980's    "twice; once on my front side; once on my back" (02/07/2012)  . CORONARY ARTERY DISEASE 11/28/2006    Qualifier: Diagnosis of  By: Tawanna Cooler MD, Eugenio Hoes   . Pacemaker   . Paroxysmal a-fib, history of 08/02/2011  . Tremor, essential 08/02/2011  . Esophageal disorder, with history of dilatation 08/02/2011  . Angina   . H/O hiatal hernia   . Neuromuscular disorder     essential tremors  . UTI (lower urinary tract infection) 08/03/2011  . Dizziness, near syncope 11/07/2011  . Macular degeneration, dry     "both eyes" (02/07/2012)  .  Hypertension   . HTN (hypertension), accelerated this admit 11/08/2011  . Pneumonia 1927  . Syncope    Past Surgical History  Procedure Laterality Date  . Repair spigelian hernia    . S/p childbirth      x2  . Cataract extraction w/ intraocular lens  implant, bilateral  ~ 1997  . Vaginal hysterectomy  1970's  . Left oophorectomy  1970's  . Insert / replace / remove pacemaker  2000; 2009    initial placement; replaced  . Appendectomy  1933  . Cholecystectomy  1950's  . Hernia repair    . Esophageal dilation      "once" (02/07/2012)  . Cardiac catheterization  12/24/2005    No intervention - recommend medical therapy  . Cardiac catheterization  10/13/2006    80% proximal LAD stenosis, stented w/ a 3.5x84mm Cypher stent at 14atm (3.58mm), resulting in reduction of 80% to 0% residual.  . Cardiac catheterization  11/14/2006    No intervention - recommend medical therapy  . Cardiac catheterization  07/24/2007    No intervention.  . Cardiac catheterization  01/16/2010    75% proximal LAD stenosis w/ in-stent restenosis. No intervention. Dr Allyson Sabal will intervene on proximal LAD lesion.  . Cardiac catheterization  01/16/2010    75% proximal LAD stenosis, stented w/ a 3x66mm Promus DES at 16atm, resulting in reduction 75% to  0% residual  . Cardioversion  07/11/2010    Successful conversion to V paced verified via pacemaker.  . Cardiac catheterization  09/08/2010    LAD 75% in-stent restenosis, stented w/ a 3x36mm Resolute DES at 16atm (3.83mm), resulting in reduction of 75% stenosis  . Renal doppler  12/12/2010    Bilat proximal renal 1-59% diameter reduction. Bilat kidneys are normal.  . Cardiac catheterization  12/22/2010    No intervention - recommend medical therapy.  . Cardiovascular stress test  08/09/2011    No scintigraphic evidence of inducible myocardial ischemia. Non-diagnostic for ischemia.  . Transthoracic echocardiogram  01/21/2012    EF 55-60%, mild-moderate tricuspid regurg.    Family History  Problem Relation Age of Onset  . Sudden death Mother    History  Substance Use Topics  . Smoking status: Never Smoker   . Smokeless tobacco: Never Used  . Alcohol Use: No   OB History   Grav Para Term Preterm Abortions TAB SAB Ect Mult Living                 Review of Systems  Constitutional: Negative for fever and chills.  HENT: Negative for sore throat.   Eyes: Negative for redness.  Respiratory: Negative for shortness of breath.   Cardiovascular: Negative for chest pain.  Gastrointestinal: Negative for vomiting and abdominal pain.  Genitourinary: Negative for dysuria and flank pain.  Musculoskeletal: Negative for back pain and neck pain.  Skin: Negative for rash.  Neurological: Negative for headaches.  Hematological: Does not bruise/bleed easily.  Psychiatric/Behavioral: Negative for confusion.      Allergies  Codeine; Imdur; Isosorbide mononitrate; Lorazepam; Prednisone; Penicillins; and Valium  Home Medications   Current Outpatient Rx  Name  Route  Sig  Dispense  Refill  . amiodarone (PACERONE) 200 MG tablet   Oral   Take 1 tablet (200 mg total) by mouth daily.   30 tablet   11   . gabapentin (NEURONTIN) 100 MG capsule   Oral   Take 1 capsule (100 mg total) by mouth at bedtime.   30 capsule   0   . Hydrocodone-Acetaminophen 7.5-300 MG TABS   Oral   Take 0.5 tablets by mouth every 4 (four) hours as needed (for pain).         [name] levothyroxine (SYNTHROID, LEVOTHROID) 100 MCG tablet   Oral   Take 100 mcg by mouth daily before breakfast.         . loratadine (CLARITIN) 10 MG tablet   Oral   Take 10 mg by mouth daily.           [name] losartan (COZAAR) 50 MG tablet   Oral   Take 50 mg by mouth 2 (two) times daily.         . metoprolol (LOPRESSOR) 50 MG tablet   Oral   Take 50 mg by mouth at bedtime.         . midodrine (PROAMATINE) 2.5 MG tablet   Oral   Take 2.5 mg by mouth daily before breakfast.         . Multiple  Vitamins-Minerals (PRESERVISION AREDS 2) CAPS   Oral   Take 1 capsule by mouth daily.          . pantoprazole (PROTONIX) 40 MG tablet   Oral   Take 40 mg by mouth 2 (two) times daily.         . polyethylene glycol (MIRALAX / GLYCOLAX) packet   Oral   Take  17 g by mouth every other day. For constipation         . ranolazine (RANEXA) 500 MG 12 hr tablet   Oral   Take 500 mg by mouth daily.          . simvastatin (ZOCOR) 20 MG tablet   Oral   Take 1 tablet (20 mg total) by mouth at bedtime.   90 tablet   3   . traMADol (ULTRAM) 50 MG tablet   Oral   Take 50 mg by mouth at bedtime.         [name] trimethoprim (TRIMPEX) 100 MG tablet   Oral   Take 100 mg by mouth at bedtime.         [name] aspirin 81 MG EC tablet   Oral   Take 81 mg by mouth daily.           . clopidogrel (PLAVIX) 75 MG tablet   Oral   Take 75 mg by mouth daily.           . nitroGLYCERIN (NITROSTAT) 0.4 MG SL tablet   Sublingual   Place 0.4 mg under the tongue every 5 (five) minutes as needed for chest pain. For chest pain          BP 193/65  Pulse 77  Temp(Src) 97.9 F (36.6 C) (Oral)  Resp 16  Ht 5\' 2"  (1.575 m)  Wt 160 lb (72.576 kg)  BMI 29.26 kg/m2  SpO2 99% Physical Exam  Nursing note and vitals reviewed. Constitutional: She appears well-developed and well-nourished. No distress.  HENT:  Head: Atraumatic.  Eyes: Conjunctivae are normal. No scleral icterus.  Neck: Neck supple. No tracheal deviation present.  Cardiovascular: Normal rate, normal heart sounds and intact distal pulses.   Pulmonary/Chest: Effort normal and breath sounds normal. No respiratory distress.  Abdominal: Soft. Normal appearance. She exhibits no distension. There is no tenderness.  Musculoskeletal: She exhibits no edema.  TL spine non tender.  w active/passive rom at hip and knee, pt with pain. Distal pulses palp. Symmetric edema bil lower legs. Fem and distal pulses palp.   Neurological: She is alert.   Alert, oriented. Speech clear. LLE motor intact. sens intact.   Skin: Skin is warm and dry. No rash noted.  Psychiatric: She has a normal mood and affect.    ED Course  Procedures (including critical care time)  Results for orders placed during the hospital encounter of 05/22/13  CBC      Result Value Ref Range   WBC 7.5  4.0 - 10.5 K/uL   RBC 3.90  3.87 - 5.11 MIL/uL   Hemoglobin 10.3 (*) 12.0 - 15.0 g/dL   HCT 91.4 (*) 78.2 - 95.6 %   MCV 85.1  78.0 - 100.0 fL   MCH 26.4  26.0 - 34.0 pg   MCHC 31.0  30.0 - 36.0 g/dL   RDW 21.3 (*) 08.6 - 57.8 %   Platelets 292  150 - 400 K/uL  COMPREHENSIVE METABOLIC PANEL      Result Value Ref Range   Sodium 138  137 - 147 mEq/L   Potassium 3.9  3.7 - 5.3 mEq/L   Chloride 100  96 - 112 mEq/L   CO2 23  19 - 32 mEq/L   Glucose, Bld 105 (*) 70 - 99 mg/dL   BUN 13  6 - 23 mg/dL   Creatinine, Ser 4.69  0.50 - 1.10 mg/dL   Calcium 9.0  8.4 - 62.9 mg/dL  Total Protein 6.9  6.0 - 8.3 g/dL   Albumin 3.6  3.5 - 5.2 g/dL   AST 30  0 - 37 U/L   ALT 22  0 - 35 U/L   Alkaline Phosphatase 215 (*) 39 - 117 U/L   Total Bilirubin 0.5  0.3 - 1.2 mg/dL   GFR calc non Af Amer 62 (*) >90 mL/min   GFR calc Af Amer 72 (*) >90 mL/min   Dg Pelvis 1-2 Views  05/06/2013   CLINICAL DATA:  Left hip and proximal femur pain.  EXAM: PELVIS - 1-2 VIEW  COMPARISON:  DG ABD 1 VIEW dated 02/14/2013; DG ABD PORTABLE 2V dated 02/13/2013; CT ANGIO CHEST AORTIC DISSECTION W/CM \\T \/OR WO/CM dated 11/07/2011; DG FEMUR*L* dated 05/06/2013  FINDINGS: Atherosclerotic vascular calcifications. Spurring at the junction of the left femoral head and neck has been present on prior exams and accordingly the cortical irregularity in this vicinity is not thought to represent fracture. No significant loss of articular space in the left or right hip.  Prominence of stool in the lower abdominal colon.  No additional significant abnormal findings.  IMPRESSION: 1.  Prominent stool throughout the colon  favors constipation. 2. Atherosclerosis. 3. Degenerative spurring of left femoral head. No acute bony findings.   Electronically Signed   By: Herbie BaltimoreWalt  Liebkemann M.D.   On: 05/06/2013 13:13   Dg Femur Left  05/06/2013   CLINICAL DATA:  Left hip and proximal femur pain.  EXAM: LEFT FEMUR - 2 VIEW  COMPARISON:  None.  FINDINGS: Minimal spurring along the left femoral head. Bony demineralization. Vascular calcifications. Loss of articular space in the medial compartment of the knee. Trace knee effusion. There is subcutaneous edema anterior to the patellar tendon.  IMPRESSION: 1. No acute bony findings. 2. Subcutaneous edema anterior to the patellar tendon. 3. Atherosclerosis. 4. Mild degenerative spurring of left femoral head. 5. Bony demineralization. 6. Trace knee effusion.   Electronically Signed   By: Herbie BaltimoreWalt  Liebkemann M.D.   On: 05/06/2013 13:14   Ct Pelvis Wo Contrast  05/20/2013   CLINICAL DATA:  Left groin pain.  Evaluate possible fracture.  EXAM: CT PELVIS WITHOUT CONTRAST  TECHNIQUE: Multidetector CT imaging of the pelvis was performed following the standard protocol without intravenous contrast.  COMPARISON:  None.  FINDINGS: There is a iliopsoas hematoma on the left side. This could be due to a muscle tear or spontaneous hemorrhage.  Both hips are normally located. No hip fracture or AVN. Mild degenerative changes bilaterally. The pubic symphysis and SI joints are intact. No pelvic fractures are identified.  No significant intrapelvic abnormalities. Advanced atherosclerotic calcifications are noted involving the aorta and iliac vessels. No inguinal mass or hernia.  IMPRESSION: Left-sided ileopsoas hematoma possibly due to a muscle tear or spontaneous hemorrhage. This likely accounts for the patient's left groin pain.  No hip or pelvic fractures.   Electronically Signed   By: Loralie ChampagneMark  Gallerani M.D.   On: 05/20/2013 15:35      MDM  Iv ns. Morphine iv. zofran iv.  Reviewed nursing notes and prior charts  for additional history.   Recent ct result below: IMPRESSION:  Left-sided ileopsoas hematoma possibly due to a muscle tear or  spontaneous hemorrhage. This likely accounts for the patient's left  groin pain.  No hip or pelvic fractures.  Electronically Signed  By: Loralie ChampagneMark Gallerani M.D.  On: 05/20/2013 15:35   Pt w recent vascular doppler left leg neg for dvt.   Recheck pain improved,  pt comfortable.   Discussed ct, reinforced plan of holding asa and plavix, close pcp f/u.     Suzi Roots, MD 05/22/13 2010

## 2013-05-22 NOTE — Discharge Instructions (Signed)
Take your hydrocodone as need - one tablet every 4-6 hours. Elevate legs.  Hold the aspirin and coumadin.  Contact your doctor in the next 1-2 days to arrange close follow up/recheck with them in the next few days.  Also discuss if and when they want you to restart the aspirin or plavix. Return to ER right away if worse, new symptoms, increase/progressive leg swelling or redness, fevers, worsening or intractable pain, numbness, other concern.      Hematoma A hematoma is a collection of blood. The collection of blood can turn into a hard, painful lump under the skin. Your skin may turn blue or yellow if the hematoma is close to the surface of the skin. Most hematomas get better in a few days to weeks. Some hematomas are serious and need medical care. Hematomas can be very small or very big. HOME CARE  Apply ice to the injured area:  Put ice in a plastic bag.  Place a towel between your skin and the bag.  Leave the ice on for 20 minutes, 2 3 times a day for the first 1 to 2 days.  After the first 2 days, switch to using warm packs on the injured area.  Raise (elevate) the injured area to lessen pain and puffiness (swelling). You may also wrap the area with an elastic bandage. Make sure the bandage is not wrapped too tight.  If you have a painful hematoma on your leg or foot, you may use crutches for a couple days.  Only take medicines as told by your doctor. GET HELP RIGHT AWAY IF:   Your pain gets worse.  Your pain is not controlled with medicine.  You have a fever.  Your puffiness gets worse.  Your skin turns more blue or yellow.  Your skin over the hematoma breaks or starts bleeding.  Your hematoma is in your chest or belly (abdomen) and you are short of breath, feel weak, or have a change in consciousness.  Your hematoma is on your scalp and you have a headache that gets worse or a change in alertness or consciousness. MAKE SURE YOU:   Understand these  instructions.  Will watch your condition.  Will get help right away if you are not doing well or get worse. Document Released: 04/26/2004 Document Revised: 11/19/2012 Document Reviewed: 08/27/2012 [facility] Patient Information 2014 [employer], [place].

## 2013-05-25 ENCOUNTER — Other Ambulatory Visit: Payer: Self-pay | Admitting: *Deleted

## 2013-05-25 ENCOUNTER — Telehealth: Payer: Self-pay | Admitting: Family Medicine

## 2013-05-25 MED ORDER — CLOPIDOGREL BISULFATE 75 MG PO TABS: 75.0000 mg | ORAL_TABLET | Freq: Every day | ORAL | Status: DC

## 2013-05-25 MED ORDER — LOSARTAN POTASSIUM 50 MG PO TABS: 50.0000 mg | ORAL_TABLET | Freq: Two times a day (BID) | ORAL | Status: DC

## 2013-05-25 NOTE — Telephone Encounter (Signed)
Pt son is calling regarding pt rx clopidogrel (PLAVIX) 75 MG tablet, son is needing to know if pt should start back taking the meds, states that they were informed by the emergency room ([facility]) to stop taking the medication.

## 2013-05-25 NOTE — Telephone Encounter (Signed)
Per Dr Tawanna Coolerodd patient should not start her asa or plavix.  Son is aware.

## 2013-05-25 NOTE — Telephone Encounter (Signed)
Plavix and Lisinopril refilled electronically.

## 2013-06-03 ENCOUNTER — Ambulatory Visit (INDEPENDENT_AMBULATORY_CARE_PROVIDER_SITE_OTHER): Payer: Medicare Other | Admitting: *Deleted

## 2013-06-03 DIAGNOSIS — I4891 Unspecified atrial fibrillation: Secondary | ICD-10-CM

## 2013-06-03 LAB — MDC_IDC_ENUM_SESS_TYPE_REMOTE
Battery Impedance: 1313 Ohm
Battery Remaining Longevity: 43 mo
Battery Voltage: 2.77 V
Brady Statistic AS VP Percent: 0 %
Lead Channel Pacing Threshold Amplitude: 0.5 V
Lead Channel Pacing Threshold Amplitude: 1.375 V
Lead Channel Pacing Threshold Pulse Width: 0.4 ms
Lead Channel Setting Pacing Amplitude: 1.5 V
Lead Channel Setting Pacing Amplitude: 3 V
Lead Channel Setting Pacing Pulse Width: 0.4 ms
Lead Channel Setting Sensing Sensitivity: 4 mV
MDC IDC MSMT LEADCHNL RA IMPEDANCE VALUE: 547 Ohm
MDC IDC MSMT LEADCHNL RA PACING THRESHOLD PULSEWIDTH: 0.4 ms
MDC IDC MSMT LEADCHNL RV IMPEDANCE VALUE: 820 Ohm
MDC IDC MSMT LEADCHNL RV SENSING INTR AMPL: 22.4 mV
MDC IDC SESS DTM: 20150304140722
MDC IDC STAT BRADY AP VP PERCENT: 71 %
MDC IDC STAT BRADY AP VS PERCENT: 25 %
MDC IDC STAT BRADY AS VS PERCENT: 4 %

## 2013-06-03 LAB — PACEMAKER DEVICE OBSERVATION

## 2013-06-07 ENCOUNTER — Encounter: Payer: Self-pay | Admitting: Cardiovascular Disease

## 2013-06-08 ENCOUNTER — Telehealth: Payer: Self-pay | Admitting: *Deleted

## 2013-06-08 NOTE — Telephone Encounter (Signed)
New Problem:  Pt's son is calling to see if a remote transmission went through or if it needs to be resent.

## 2013-06-08 NOTE — Telephone Encounter (Signed)
Message forwarded to Shakila, CMA r/t device/monitor concerns.  

## 2013-06-08 NOTE — Telephone Encounter (Signed)
Transmission was received. No alerts or episodes. ROV w/ Dr. Royann Shiversroitoru 08/12/13 @ 10:30.

## 2013-06-14 ENCOUNTER — Encounter: Payer: Self-pay | Admitting: Cardiovascular Disease

## 2013-06-15 ENCOUNTER — Telehealth: Payer: Self-pay | Admitting: *Deleted

## 2013-06-19 ENCOUNTER — Encounter: Payer: Self-pay | Admitting: *Deleted

## 2013-06-22 ENCOUNTER — Encounter: Payer: Self-pay | Admitting: Cardiology

## 2013-06-22 ENCOUNTER — Ambulatory Visit (INDEPENDENT_AMBULATORY_CARE_PROVIDER_SITE_OTHER): Payer: Medicare Other | Admitting: Cardiology

## 2013-06-22 VITALS — BP 140/78 | HR 72 | Ht 62.0 in | Wt 148.4 lb

## 2013-06-22 DIAGNOSIS — I480 Paroxysmal atrial fibrillation: Secondary | ICD-10-CM

## 2013-06-22 DIAGNOSIS — I4891 Unspecified atrial fibrillation: Secondary | ICD-10-CM

## 2013-06-22 DIAGNOSIS — E785 Hyperlipidemia, unspecified: Secondary | ICD-10-CM

## 2013-06-22 DIAGNOSIS — S7010XA Contusion of unspecified thigh, initial encounter: Secondary | ICD-10-CM

## 2013-06-22 DIAGNOSIS — I2510 Atherosclerotic heart disease of native coronary artery without angina pectoris: Secondary | ICD-10-CM

## 2013-06-22 NOTE — Progress Notes (Signed)
06/23/2013   PCP: Evette GeorgesDD,JEFFREY ALLEN, MD   Chief Complaint  Patient presents with  . Follow-up    Developed an Iliopsoas Muscle Hematoma. Was told to stop ASA and Plaivx. Pt is here with son to discss restarting asa and plavix.    Primary Cardiologist: Dr. Royann Shiversroitoru  HPI:  80 year old WF here with her son and caregiver today for follow up after lt. Iliopsoas muscle hematoma seen on CT scan 05/20/13.  She had developed pain in her lt leg 05/06/13.  She was seen in ER and by ortho before CT was done and source of pain found.  We stopped her asa and plavix.  She is back today to resume meds is able.  She has CAD and PAF though no new stents since 2012 and no recent episodes of atrial fib.  Her leg is much improved.  Very minimal pain now.  No chest pain, no SOB.    See below for other hx.   Allergies  Allergen Reactions  . Codeine Other (See Comments)    Syncope   . Imdur [Isosorbide] Other (See Comments)    syncope  . Isosorbide Mononitrate Other (See Comments)    fainted  . Lorazepam Other (See Comments)    hallucinations  . Prednisone Nausea And Vomiting  . Penicillins Rash  . Valium [Diazepam] Other (See Comments)    Reaction unknown    Current Outpatient Prescriptions  Medication Sig Dispense Refill  . amiodarone (PACERONE) 200 MG tablet Take 1 tablet (200 mg total) by mouth daily.  30 tablet  11  . levothyroxine (SYNTHROID, LEVOTHROID) 100 MCG tablet Take 100 mcg by mouth daily before breakfast.      . loratadine (CLARITIN) 10 MG tablet Take 10 mg by mouth daily.        [name]. losartan (COZAAR) 50 MG tablet Take 1 tablet (50 mg total) by mouth 2 (two) times daily.  180 tablet  2  . metoprolol (LOPRESSOR) 50 MG tablet Take 50 mg by mouth at bedtime.      . midodrine (PROAMATINE) 2.5 MG tablet Take 2.5 mg by mouth daily before breakfast.      . Multiple Vitamins-Minerals (PRESERVISION AREDS 2) CAPS Take 1 capsule by mouth daily.       . nitroGLYCERIN (NITROSTAT)  0.4 MG SL tablet Place 0.4 mg under the tongue every 5 (five) minutes as needed for chest pain. For chest pain      . pantoprazole (PROTONIX) 40 MG tablet Take 40 mg by mouth 2 (two) times daily.      . polyethylene glycol (MIRALAX / GLYCOLAX) packet Take 17 g by mouth every other day. For constipation      . ranolazine (RANEXA) 500 MG 12 hr tablet Take 500 mg by mouth daily.       . simvastatin (ZOCOR) 20 MG tablet Take 1 tablet (20 mg total) by mouth at bedtime.  90 tablet  3  . traMADol (ULTRAM) 50 MG tablet Take 50 mg by mouth at bedtime.      [name]. trimethoprim (TRIMPEX) 100 MG tablet Take 100 mg by mouth at bedtime.      [name]. aspirin 81 MG EC tablet Take 81 mg by mouth daily.        . clopidogrel (PLAVIX) 75 MG tablet Take 1 tablet (75 mg total) by mouth daily.  90 tablet  2   No current facility-administered medications for this visit.  Past Medical History  Diagnosis Date  . CHF (congestive heart failure)   . A-fib   . Hypothyroid   . GERD (gastroesophageal reflux disease)   . Hypothyroidism   . Bradycardia   . Pancreatitis   . Coronary artery disease   . Shingles 1980's    "twice; once on my front side; once on my back" (02/07/2012)  . CORONARY ARTERY DISEASE 11/28/2006    Qualifier: Diagnosis of  By: Tawanna Cooler MD, Eugenio Hoes   . Pacemaker   . Paroxysmal a-fib, history of 08/02/2011  . Tremor, essential 08/02/2011  . Esophageal disorder, with history of dilatation 08/02/2011  . Angina   . H/O hiatal hernia   . Neuromuscular disorder     essential tremors  . UTI (lower urinary tract infection) 08/03/2011  . Dizziness, near syncope 11/07/2011  . Macular degeneration, dry     "both eyes" (02/07/2012)  . Hypertension   . HTN (hypertension), accelerated this admit 11/08/2011  . Pneumonia 1927  . Syncope     Past Surgical History  Procedure Laterality Date  . Repair spigelian hernia    . S/p childbirth      x2  . Cataract extraction w/ intraocular lens  implant, bilateral  ~ 1997  .  Vaginal hysterectomy  1970's  . Left oophorectomy  1970's  . Insert / replace / remove pacemaker  2000; 2009    initial placement; replaced  . Appendectomy  1933  . Cholecystectomy  1950's  . Hernia repair    . Esophageal dilation      "once" (02/07/2012)  . Cardiac catheterization  12/24/2005    No intervention - recommend medical therapy  . Cardiac catheterization  10/13/2006    80% proximal LAD stenosis, stented w/ a 3.5x69mm Cypher stent at 14atm (3.53mm), resulting in reduction of 80% to 0% residual.  . Cardiac catheterization  11/14/2006    No intervention - recommend medical therapy  . Cardiac catheterization  07/24/2007    No intervention.  . Cardiac catheterization  01/16/2010    75% proximal LAD stenosis w/ in-stent restenosis. No intervention. Dr Allyson Sabal will intervene on proximal LAD lesion.  . Cardiac catheterization  01/16/2010    75% proximal LAD stenosis, stented w/ a 3x78mm Promus DES at 16atm, resulting in reduction 75% to 0% residual  . Cardioversion  07/11/2010    Successful conversion to V paced verified via pacemaker.  . Cardiac catheterization  09/08/2010    LAD 75% in-stent restenosis, stented w/ a 3x12mm Resolute DES at 16atm (3.44mm), resulting in reduction of 75% stenosis  . Renal doppler  12/12/2010    Bilat proximal renal 1-59% diameter reduction. Bilat kidneys are normal.  . Cardiac catheterization  12/22/2010    No intervention - recommend medical therapy.  . Cardiovascular stress test  08/09/2011    No scintigraphic evidence of inducible myocardial ischemia. Non-diagnostic for ischemia.  . Transthoracic echocardiogram  01/21/2012    EF 55-60%, mild-moderate tricuspid regurg.    ZOX:[id number]:UJ colds or fevers, no weight changes Skin:no rashes or ulcers HEENT:no blurred vision, no congestion CV:see HPI PUL:see HPI GI:no diarrhea constipation or melena, no indigestion GU:no hematuria, no dysuria MS:no joint pain, no claudication, pain at hematoma site  improved Neuro:no syncope, no lightheadedness Endo:no diabetes, no thyroid disease  PHYSICAL EXAM BP 140/78  Pulse 72  Ht 5\' 2"  (1.575 m)  Wt 148 lb 6.4 oz (67.314 kg)  BMI 27.14 kg/m2 General:Pleasant affect, NAD Skin:Warm and dry, brisk capillary refill HEENT:normocephalic, sclera clear,  mucus membranes moist Neck:supple, no JVD, no bruits  Heart:S1S2 RRR without murmur, gallup, rub or click Lungs:clear without rales, rhonchi, or wheezes ZOX:WRUE, non tender, + BS, do not palpate liver spleen or masses Ext:no lower ext edema, 2+ pedal pulses, 2+ radial pulses Neuro:alert and oriented, MAE, follows commands, + facial symmetry  AVW:[id number]  ASSESSMENT AND PLAN Hematoma of iliopsoas muscle, 05/06/13, spontaneous bleed, seen on CT scan  Pt has been off plavix and ASA for 1 month, will resume ASA alone.  Last stent was 2012. She does have hx of PAF but with spontaneous bleed on Plavix and ASA will only use ASA.  Discussed with Dr. Rennis Golden  Paroxysmal a-fib, history of, off coumadin since 10/2010, without recurrence of afib. No recurrence of atrial fib.  CORONARY ARTERY DISEASE With hx of stents. Last placed 2012

## 2013-06-22 NOTE — Patient Instructions (Signed)
Resume 81 mg asprin daily   No Plavix.  Keep follow up appt with Dr. Royann Shiversroitoru

## 2013-06-22 NOTE — Assessment & Plan Note (Signed)
Pt has been off plavix and ASA for 1 month, will resume ASA alone.  Last stent was 2012. She does have hx of PAF but with spontaneous bleed on Plavix and ASA will only use ASA.  Discussed with Dr. Rennis GoldenHilty

## 2013-06-23 NOTE — Assessment & Plan Note (Signed)
With hx of stents. Last placed 2012

## 2013-06-23 NOTE — Assessment & Plan Note (Signed)
No recurrence of atrial fib.

## 2013-06-29 ENCOUNTER — Other Ambulatory Visit: Payer: Self-pay | Admitting: *Deleted

## 2013-06-29 ENCOUNTER — Telehealth: Payer: Self-pay | Admitting: Cardiovascular Disease

## 2013-06-29 MED ORDER — TRAMADOL HCL 50 MG PO TABS: 50.0000 mg | ORAL_TABLET | Freq: Every day | ORAL | Status: DC

## 2013-06-29 MED ORDER — LEVOTHYROXINE SODIUM 100 MCG PO TABS: 100.0000 ug | ORAL_TABLET | Freq: Every day | ORAL | Status: DC

## 2013-06-29 NOTE — Telephone Encounter (Signed)
Refill for synthroid refused. Defer to PCP

## 2013-06-29 NOTE — Telephone Encounter (Signed)
Prescription for #30 Tramadol destroyed.  New Rx for Tramadol #90 done and signed by Dr. Salena Saner.  Notified to pick up in AM.  Voiced understanding.  Refills done as requested by Levothyroxin electronically.

## 2013-06-29 NOTE — Telephone Encounter (Signed)
The pharmacist told him to call and see if you have received the fax for her Tramdol. They said today would be the second time they have faxed it. Please Peyton NajjarLarry know if you have received either of these fact,because his mother needs her medicine.

## 2013-06-29 NOTE — Telephone Encounter (Signed)
Message forwarded to Dr. Croitoru/Barbara, CMA 

## 2013-06-30 NOTE — Telephone Encounter (Signed)
Rx printed and Dr. Salena Saner. Signed Tramadol 50mg  #90 w/0 refills.  Son will pick up 06/30/13 am

## 2013-07-14 ENCOUNTER — Encounter: Payer: Self-pay | Admitting: *Deleted

## 2013-08-05 ENCOUNTER — Encounter: Payer: Self-pay | Admitting: Cardiovascular Disease

## 2013-08-12 ENCOUNTER — Ambulatory Visit (INDEPENDENT_AMBULATORY_CARE_PROVIDER_SITE_OTHER): Payer: Medicare Other | Admitting: Cardiovascular Disease

## 2013-08-12 ENCOUNTER — Encounter: Payer: Self-pay | Admitting: Cardiovascular Disease

## 2013-08-12 ENCOUNTER — Telehealth: Payer: Self-pay | Admitting: Cardiovascular Disease

## 2013-08-12 VITALS — BP 184/87 | HR 78 | Resp 20 | Ht 62.0 in | Wt 144.0 lb

## 2013-08-12 DIAGNOSIS — I480 Paroxysmal atrial fibrillation: Secondary | ICD-10-CM

## 2013-08-12 DIAGNOSIS — I4891 Unspecified atrial fibrillation: Secondary | ICD-10-CM

## 2013-08-12 DIAGNOSIS — I2510 Atherosclerotic heart disease of native coronary artery without angina pectoris: Secondary | ICD-10-CM

## 2013-08-12 LAB — PACEMAKER DEVICE OBSERVATION

## 2013-08-12 LAB — MDC_IDC_ENUM_SESS_TYPE_INCLINIC
Brady Statistic AP VP Percent: 78.9 %
Brady Statistic AP VS Percent: 18.9 %
Brady Statistic AS VP Percent: 0.1 % — CL
Brady Statistic AS VS Percent: 2.2 %
Lead Channel Impedance Value: 565 Ohm
Lead Channel Impedance Value: 870 Ohm
Lead Channel Pacing Threshold Amplitude: 0.5 V
Lead Channel Pacing Threshold Pulse Width: 0.4 ms
Lead Channel Sensing Intrinsic Amplitude: 1.4 mV
Lead Channel Setting Pacing Amplitude: 2.5 V
Lead Channel Setting Sensing Sensitivity: 4 mV
MDC IDC MSMT BATTERY IMPEDANCE: 1370 Ohm
MDC IDC MSMT BATTERY VOLTAGE: 2.76 V
MDC IDC MSMT LEADCHNL RV PACING THRESHOLD AMPLITUDE: 1.25 V
MDC IDC MSMT LEADCHNL RV PACING THRESHOLD PULSEWIDTH: 0.4 ms
MDC IDC MSMT LEADCHNL RV SENSING INTR AMPL: 8 mV
MDC IDC SET LEADCHNL RA PACING AMPLITUDE: 1.5 V
MDC IDC SET LEADCHNL RV PACING PULSEWIDTH: 0.4 ms

## 2013-08-12 NOTE — Progress Notes (Signed)
Patient ID: [name] Luna, female   DOB: 05/06/1920, 80 y.o.   MRN: [id number]      Reason for office visit Paroxysmal atrial fibrillation, pacemaker check, severe orthostatic hypotension, CAD  [name] Luna is here for routine followup and pacemaker interrogation. She has not had any recent falls or syncopal events. She takes Midodrine before getting up out of bed in the morning. She took some this morning.  She has a history of coronary disease and received a stent to the LAD artery in 2012 (drug-eluting resolute 3.0 x 18 mm, just distal to an older 3.5 x 13 mm Promus stent placed in 2008). She has a history of complete heart block and tachycardia bradycardia syndrome and received a dual-chamber terminal pacemaker in 2009 (Medtronic versa). She hs a history of paroxysmal atrial fibrillation but had a spontaneous iliopsoas hematoma while on combination aspirin/clopidogrel therapy. She is now on aspirin monotherapy. She is quite debilitated and spends most of her time in a wheelchair, although at home she does occasionally use a walker. She has had recurrent problems with dizziness that appear to be secondary to orthostatic hypotension and takes Midodrine. She had a syncopal event in November not associated with significant injury.  She also has a history of gastroesophageal reflux disease, esophageal stricture requiring dilatation as well as hyperlipidemia. She has progressive dementia.  Interrogation of her pacemaker shows normal device function. She has not had any meaningful arrhythmic events. There has been no evidence of atrial fibrillation. Her pacemaker shows roughly 80% ventricular pacing but the electrocardiogram shows evidence of pseudofusion. She also has 98% atrial pacing.     Allergies  Allergen Reactions  . Codeine Other (See Comments)    Syncope   . Imdur [Isosorbide] Other (See Comments)    syncope  . Isosorbide Mononitrate Other (See Comments)    fainted  . Lorazepam Other  (See Comments)    hallucinations  . Prednisone Nausea And Vomiting  . Penicillins Rash  . Valium [Diazepam] Other (See Comments)    Reaction unknown    Current Outpatient Prescriptions  Medication Sig Dispense Refill  . amiodarone (PACERONE) 200 MG tablet Take 1 tablet (200 mg total) by mouth daily.  30 tablet  11  . aspirin 81 MG EC tablet Take 81 mg by mouth daily.        [name] levothyroxine (SYNTHROID, LEVOTHROID) 100 MCG tablet Take 1 tablet (100 mcg total) by mouth daily before breakfast.  90 tablet  2  . loratadine (CLARITIN) 10 MG tablet Take 10 mg by mouth daily.        [name] losartan (COZAAR) 50 MG tablet Take 1 tablet (50 mg total) by mouth 2 (two) times daily.  180 tablet  2  . metoprolol (LOPRESSOR) 50 MG tablet Take 50 mg by mouth at bedtime.      . midodrine (PROAMATINE) 2.5 MG tablet Take 2.5 mg by mouth daily before breakfast.      . Multiple Vitamins-Minerals (PRESERVISION AREDS 2) CAPS Take 1 capsule by mouth daily.       . nitroGLYCERIN (NITROSTAT) 0.4 MG SL tablet Place 0.4 mg under the tongue every 5 (five) minutes as needed for chest pain. For chest pain      . pantoprazole (PROTONIX) 40 MG tablet Take 40 mg by mouth 2 (two) times daily.      . polyethylene glycol (MIRALAX / GLYCOLAX) packet Take 17 g by mouth every other day. For constipation      . ranolazine (RANEXA)  500 MG 12 hr tablet Take 500 mg by mouth daily.       . simvastatin (ZOCOR) 20 MG tablet Take 1 tablet (20 mg total) by mouth at bedtime.  90 tablet  3  . traMADol (ULTRAM) 50 MG tablet Take 1 tablet (50 mg total) by mouth at bedtime.  90 tablet  0  . trimethoprim (TRIMPEX) 100 MG tablet Take 100 mg by mouth at bedtime.       No current facility-administered medications for this visit.    Past Medical History  Diagnosis Date  . CHF (congestive heart failure)   . A-fib   . Hypothyroid   . GERD (gastroesophageal reflux disease)   . Hypothyroidism   . Bradycardia   . Pancreatitis   . Coronary artery  disease   . Shingles 1980's    "twice; once on my front side; once on my back" (02/07/2012)  . CORONARY ARTERY DISEASE 11/28/2006    Qualifier: Diagnosis of  By: Tawanna Cooler MD, Eugenio Hoes   . Pacemaker   . Paroxysmal a-fib, history of 08/02/2011  . Tremor, essential 08/02/2011  . Esophageal disorder, with history of dilatation 08/02/2011  . Angina   . H/O hiatal hernia   . Neuromuscular disorder     essential tremors  . UTI (lower urinary tract infection) 08/03/2011  . Dizziness, near syncope 11/07/2011  . Macular degeneration, dry     "both eyes" (02/07/2012)  . Hypertension   . HTN (hypertension), accelerated this admit 11/08/2011  . Pneumonia 1927  . Syncope     Past Surgical History  Procedure Laterality Date  . Repair spigelian hernia    . S/p childbirth      x2  . Cataract extraction w/ intraocular lens  implant, bilateral  ~ 1997  . Vaginal hysterectomy  1970's  . Left oophorectomy  1970's  . Insert / replace / remove pacemaker  2000; 2009    initial placement; replaced  . Appendectomy  1933  . Cholecystectomy  1950's  . Hernia repair    . Esophageal dilation      "once" (02/07/2012)  . Cardiac catheterization  12/24/2005    No intervention - recommend medical therapy  . Cardiac catheterization  10/13/2006    80% proximal LAD stenosis, stented w/ a 3.5x67mm Cypher stent at 14atm (3.56mm), resulting in reduction of 80% to 0% residual.  . Cardiac catheterization  11/14/2006    No intervention - recommend medical therapy  . Cardiac catheterization  07/24/2007    No intervention.  . Cardiac catheterization  01/16/2010    75% proximal LAD stenosis w/ in-stent restenosis. No intervention. Dr Allyson Sabal will intervene on proximal LAD lesion.  . Cardiac catheterization  01/16/2010    75% proximal LAD stenosis, stented w/ a 3x23mm Promus DES at 16atm, resulting in reduction 75% to 0% residual  . Cardioversion  07/11/2010    Successful conversion to V paced verified via pacemaker.  . Cardiac  catheterization  09/08/2010    LAD 75% in-stent restenosis, stented w/ a 3x57mm Resolute DES at 16atm (3.73mm), resulting in reduction of 75% stenosis  . Renal doppler  12/12/2010    Bilat proximal renal 1-59% diameter reduction. Bilat kidneys are normal.  . Cardiac catheterization  12/22/2010    No intervention - recommend medical therapy.  . Cardiovascular stress test  08/09/2011    No scintigraphic evidence of inducible myocardial ischemia. Non-diagnostic for ischemia.  . Transthoracic echocardiogram  01/21/2012    EF 55-60%, mild-moderate tricuspid regurg.  Family History  Problem Relation Age of Onset  . Sudden death Mother     History   Social History  . Marital Status: Widowed    Spouse Name: N/A    Number of Children: N/A  . Years of Education: N/A   Occupational History  . Not on file.   Social History Main Topics  . Smoking status: Never Smoker   . Smokeless tobacco: Never Used  . Alcohol Use: No  . Drug Use: No  . Sexual Activity: No   Other Topics Concern  . Not on file   Social History Narrative  . No narrative on file    Review of systems: No syncope since November.  The patient specifically denies any chest pain at rest or with exertion, dyspnea at rest or with exertion, orthopnea, paroxysmal nocturnal dyspnea, palpitations, focal neurological deficits, intermittent claudication, lower extremity edema, unexplained weight gain, cough, hemoptysis or wheezing.  The patient also denies abdominal pain, nausea, vomiting, dysphagia, diarrhea, constipation, polyuria, polydipsia, dysuria, hematuria, frequency, urgency, abnormal bleeding or bruising, fever, chills, unexpected weight changes, mood swings, change in skin or hair texture, change in voice quality, auditory or visual problems, allergic reactions or rashes, new musculoskeletal complaints other than usual "aches and pains".   PHYSICAL EXAM BP 184/87  Pulse 78  Resp 20  Ht 5\' 2"  (1.575 m)  Wt 144 lb  (65.318 kg)  BMI 26.33 kg/m2 General: Alert, oriented x3, no distress  Head: no evidence of trauma, PERRL, EOMI, no exophtalmos or lid lag, no myxedema, no xanthelasma; normal ears, nose and oropharynx  Neck: normal jugular venous pulsations and no hepatojugular reflux; brisk carotid pulses without delay and no carotid bruits  Chest: clear to auscultation, no signs of consolidation by percussion or palpation, normal fremitus, symmetrical and full respiratory excursions, the pacemaker site looks healthy  Cardiovascular: normal position and quality of the apical impulse, regular rhythm, normal first and second heart sounds, no murmurs, rubs, S4 gallop is heard  Abdomen: no tenderness or distention, no masses by palpation, no abnormal pulsatility or arterial bruits, normal bowel sounds, no hepatosplenomegaly  Extremities: no clubbing, cyanosis or edema; 2+ radial, ulnar and brachial pulses bilaterally; 2+ right femoral, posterior tibial and dorsalis pedis pulses; 2+ left femoral, posterior tibial and dorsalis pedis pulses; no subclavian or femoral bruits  Neurological: grossly nonfocal   EKG: Atrial paced, ventricular sensed rhythm with pseudofusion  Lipid Panel     Component Value Date/Time   CHOL 150 03/02/2013 1155   TRIG 91 03/02/2013 1155   HDL 58 03/02/2013 1155   CHOLHDL 2.6 03/02/2013 1155   VLDL 18 03/02/2013 1155   LDLCALC 74 03/02/2013 1155    BMET    Component Value Date/Time   NA 138 05/22/2013 1846   K 3.9 05/22/2013 1846   CL 100 05/22/2013 1846   CO2 23 05/22/2013 1846   GLUCOSE 105* 05/22/2013 1846   BUN 13 05/22/2013 1846   CREATININE 0.81 05/22/2013 1846   CREATININE 0.77 03/02/2013 1155   CALCIUM 9.0 05/22/2013 1846   GFRNONAA 62* 05/22/2013 1846   GFRAA 72* 05/22/2013 1846     ASSESSMENT AND PLAN   CORONARY ARTERY DISEASE  Last percutaneous revascularization was performed in 2012. Disease has generally been limited to the proximal LAD without other significant stenoses.  Has preserved left ventricular systolic function. The most recent nuclear stress test was performed in May of 2013 and did not show any perfusion abnormalities.   HYPERTENSION  Unfortunately she  had significant problems with orthostatic hypotension. The midodrine has prevented further syncope. Target systolic blood pressure is probably in the 150-160 range. Her blood pressure is rather high today, but this is unusual for her. Reminded not to take ProAmatine before lying down, not to take it at night.   Hyperlipidemia  Excellent lipid profile. The potential interaction between amiodarone and simvastatin is noted. She has been taking this combination of medicines for at least 2-1/2 years without adverse events.   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  Virtually 100% atrial pacing and 79% ventricular pacing.  The atrioventricular delay was prolonged today to allow more ventricular sensing and prevent unnecessary pseudofusion.  Atrial fibrillation None has been recorded in the last almost 3 years. Continue anemia along with periodic reevaluation of thyroid and liver function tests. Not a candidate for warfarin or even clopidogrel due to spontaneous retroperitoneal hematoma. Continue aspirin monotherapy.   Orders Placed This Encounter  Procedures  . EKG 12-Lead   Kinze Labo  Thurmon FairMihai Jayven Naill, MD, [facility] [phone] office [phone] pager

## 2013-08-12 NOTE — Telephone Encounter (Signed)
He said he was told to call back and give you the 3 medicine:Ranexa,Midodrine and Metoprolol.

## 2013-08-12 NOTE — Patient Instructions (Signed)
Remote monitoring is used to monitor your Pacemaker or ICD from home. This monitoring reduces the number of office visits required to check your device to one time per year. It allows us to monitor the functioning of your device to ensure it is working properly. You are scheduled for a device check from home on November 12, 2013. You may send your transmission at any time that day. If you have a wireless device, the transmission will be sent automatically. After your physician reviews your transmission, you will receive a postcard with your next transmission date.  Dr. Royann Shiversroitoru recommends that you schedule a follow-up appointment in: 6 months with Medtronic device check.

## 2013-08-13 ENCOUNTER — Other Ambulatory Visit: Payer: Self-pay | Admitting: *Deleted

## 2013-08-13 MED ORDER — METOPROLOL TARTRATE 50 MG PO TABS: 50.0000 mg | ORAL_TABLET | Freq: Every day | ORAL | Status: DC

## 2013-08-13 MED ORDER — RANOLAZINE ER 500 MG PO TB12: 500.0000 mg | ORAL_TABLET | Freq: Every day | ORAL | Status: AC

## 2013-08-13 MED ORDER — MIDODRINE HCL 2.5 MG PO TABS: 2.5000 mg | ORAL_TABLET | Freq: Every day | ORAL | Status: AC

## 2013-08-13 NOTE — Telephone Encounter (Signed)
Refills on Ranexa, Midodrine, and Metoprolol under Dr. Renaye Rakers's name in place of Dr. Alanda AmassWeintraub with a year refills.

## 2013-08-13 NOTE — Telephone Encounter (Signed)
Refills on Ranexa, Midodrine, and Metoprolol under Dr. C's name in place of Dr. Weintraub with a year refills. 

## 2013-09-21 ENCOUNTER — Ambulatory Visit: Payer: Medicare Other | Admitting: Family Medicine

## 2013-09-21 ENCOUNTER — Telehealth: Payer: Self-pay | Admitting: Family Medicine

## 2013-09-21 MED ORDER — SIMVASTATIN 20 MG PO TABS: 20.0000 mg | ORAL_TABLET | Freq: Every day | ORAL | Status: DC

## 2013-09-21 NOTE — Telephone Encounter (Signed)
[id number] - [place] ([place]), [place] - [address] is requesting 90 day re-fill on simvastatin (ZOCOR) 20 MG tablet

## 2013-09-22 ENCOUNTER — Ambulatory Visit (INDEPENDENT_AMBULATORY_CARE_PROVIDER_SITE_OTHER): Payer: Medicare Other | Admitting: Family Medicine

## 2013-09-22 ENCOUNTER — Encounter: Payer: Self-pay | Admitting: Family Medicine

## 2013-09-22 VITALS — BP 120/80 | Temp 98.1°F | Wt 145.0 lb

## 2013-09-22 DIAGNOSIS — K5909 Other constipation: Secondary | ICD-10-CM

## 2013-09-22 DIAGNOSIS — I2510 Atherosclerotic heart disease of native coronary artery without angina pectoris: Secondary | ICD-10-CM

## 2013-09-22 NOTE — Progress Notes (Signed)
Pre visit review using our clinic review tool, if applicable. No additional management support is needed unless otherwise documented below in the visit note. 

## 2013-09-22 NOTE — Progress Notes (Signed)
   Subjective:    Patient ID: Renaldo HarrisonWinifred I Dubree, female    DOB: 09/17/1920, 80 y.o.   MRN: [id number]  HPI Venora MaplesWinifred is a 80 year old female who comes in today accompanied by her son who is her primary caregiver now for evaluation of loose bowel movements  She's had a history of constipation and MiraLax. Now she states she has loose bowel movements. She's not sure how many bowel movements she has per day. She has no fever chills abdominal pain or nausea vomiting. They stopped the MiraLax but she continues to have loose bowel movements   Review of Systems Review of systems otherwise negative    Objective:   Physical Exam Well-developed well-nourished female no acute distress vital signs stable she is afebrile       Assessment & Plan:  History of constipation now loose bowel movements question related to the PPI,,,,,,,,, DC the PPI GI consult when necessary

## 2013-09-22 NOTE — Patient Instructions (Signed)
Stop the Protonix  If after a week to 10 days she's still symptomatic then call GI for further evaluation

## 2013-10-20 ENCOUNTER — Encounter (HOSPITAL_COMMUNITY): Payer: Self-pay | Admitting: Emergency Medicine

## 2013-10-20 ENCOUNTER — Observation Stay (HOSPITAL_COMMUNITY)
Admission: EM | Admit: 2013-10-20 | Discharge: 2013-10-21 | Disposition: A | Discharge status: Home / Self Care | Payer: Medicare Other | Attending: Internal Medicine | Admitting: Internal Medicine

## 2013-10-20 ENCOUNTER — Observation Stay (HOSPITAL_COMMUNITY): Payer: Medicare Other

## 2013-10-20 ENCOUNTER — Emergency Department (HOSPITAL_COMMUNITY): Payer: Medicare Other

## 2013-10-20 DIAGNOSIS — R110 Nausea: Secondary | ICD-10-CM | POA: Diagnosis not present

## 2013-10-20 DIAGNOSIS — I10 Essential (primary) hypertension: Secondary | ICD-10-CM | POA: Insufficient documentation

## 2013-10-20 DIAGNOSIS — R42 Dizziness and giddiness: Secondary | ICD-10-CM | POA: Diagnosis not present

## 2013-10-20 DIAGNOSIS — I2510 Atherosclerotic heart disease of native coronary artery without angina pectoris: Secondary | ICD-10-CM | POA: Diagnosis not present

## 2013-10-20 DIAGNOSIS — Z79899 Other long term (current) drug therapy: Secondary | ICD-10-CM | POA: Insufficient documentation

## 2013-10-20 DIAGNOSIS — Z8701 Personal history of pneumonia (recurrent): Secondary | ICD-10-CM | POA: Diagnosis not present

## 2013-10-20 DIAGNOSIS — F0390 Unspecified dementia without behavioral disturbance: Secondary | ICD-10-CM | POA: Diagnosis present

## 2013-10-20 DIAGNOSIS — Z8619 Personal history of other infectious and parasitic diseases: Secondary | ICD-10-CM | POA: Diagnosis not present

## 2013-10-20 DIAGNOSIS — Z880 Allergy status to penicillin: Secondary | ICD-10-CM | POA: Insufficient documentation

## 2013-10-20 DIAGNOSIS — R55 Syncope and collapse: Principal | ICD-10-CM | POA: Insufficient documentation

## 2013-10-20 DIAGNOSIS — I509 Heart failure, unspecified: Secondary | ICD-10-CM | POA: Insufficient documentation

## 2013-10-20 DIAGNOSIS — E039 Hypothyroidism, unspecified: Secondary | ICD-10-CM | POA: Insufficient documentation

## 2013-10-20 DIAGNOSIS — K219 Gastro-esophageal reflux disease without esophagitis: Secondary | ICD-10-CM | POA: Insufficient documentation

## 2013-10-20 DIAGNOSIS — I519 Heart disease, unspecified: Secondary | ICD-10-CM

## 2013-10-20 DIAGNOSIS — F0393 Unspecified dementia, unspecified severity, with mood disturbance: Secondary | ICD-10-CM | POA: Diagnosis present

## 2013-10-20 DIAGNOSIS — I5189 Other ill-defined heart diseases: Secondary | ICD-10-CM | POA: Diagnosis present

## 2013-10-20 DIAGNOSIS — I4891 Unspecified atrial fibrillation: Secondary | ICD-10-CM

## 2013-10-20 DIAGNOSIS — Z8669 Personal history of other diseases of the nervous system and sense organs: Secondary | ICD-10-CM | POA: Diagnosis not present

## 2013-10-20 DIAGNOSIS — Z7982 Long term (current) use of aspirin: Secondary | ICD-10-CM | POA: Insufficient documentation

## 2013-10-20 DIAGNOSIS — Z9089 Acquired absence of other organs: Secondary | ICD-10-CM | POA: Diagnosis not present

## 2013-10-20 DIAGNOSIS — Z8679 Personal history of other diseases of the circulatory system: Secondary | ICD-10-CM

## 2013-10-20 DIAGNOSIS — Z950 Presence of cardiac pacemaker: Secondary | ICD-10-CM | POA: Diagnosis not present

## 2013-10-20 DIAGNOSIS — I480 Paroxysmal atrial fibrillation: Secondary | ICD-10-CM | POA: Diagnosis present

## 2013-10-20 DIAGNOSIS — Z87440 Personal history of urinary (tract) infections: Secondary | ICD-10-CM | POA: Diagnosis not present

## 2013-10-20 DIAGNOSIS — F329 Major depressive disorder, single episode, unspecified: Secondary | ICD-10-CM

## 2013-10-20 DIAGNOSIS — Z9889 Other specified postprocedural states: Secondary | ICD-10-CM | POA: Insufficient documentation

## 2013-10-20 DIAGNOSIS — R404 Transient alteration of awareness: Secondary | ICD-10-CM | POA: Diagnosis present

## 2013-10-20 LAB — CBC WITH DIFFERENTIAL/PLATELET
Basophils Absolute: 0 10*3/uL (ref 0.0–0.1)
Basophils Relative: 0 % (ref 0–1)
EOS ABS: 0.1 10*3/uL (ref 0.0–0.7)
Eosinophils Relative: 1 % (ref 0–5)
HCT: 38.5 % (ref 36.0–46.0)
HEMOGLOBIN: 12 g/dL (ref 12.0–15.0)
LYMPHS ABS: 0.7 10*3/uL (ref 0.7–4.0)
Lymphocytes Relative: 11 % — ABNORMAL LOW (ref 12–46)
MCH: 28.4 pg (ref 26.0–34.0)
MCHC: 31.2 g/dL (ref 30.0–36.0)
MCV: 91 fL (ref 78.0–100.0)
MONOS PCT: 12 % (ref 3–12)
Monocytes Absolute: 0.7 10*3/uL (ref 0.1–1.0)
NEUTROS ABS: 4.5 10*3/uL (ref 1.7–7.7)
NEUTROS PCT: 76 % (ref 43–77)
PLATELETS: 177 10*3/uL (ref 150–400)
RBC: 4.23 MIL/uL (ref 3.87–5.11)
RDW: 17.5 % — ABNORMAL HIGH (ref 11.5–15.5)
WBC: 6 10*3/uL (ref 4.0–10.5)

## 2013-10-20 LAB — TROPONIN I

## 2013-10-20 LAB — BASIC METABOLIC PANEL
Anion gap: 12 (ref 5–15)
BUN: 15 mg/dL (ref 6–23)
CHLORIDE: 97 meq/L (ref 96–112)
CO2: 27 mEq/L (ref 19–32)
Calcium: 8.7 mg/dL (ref 8.4–10.5)
Creatinine, Ser: 0.87 mg/dL (ref 0.50–1.10)
GFR, EST AFRICAN AMERICAN: 65 mL/min — AB (ref >90)
GFR, EST NON AFRICAN AMERICAN: 57 mL/min — AB (ref >90)
GLUCOSE: 108 mg/dL — AB (ref 70–99)
POTASSIUM: 4.1 meq/L (ref 3.7–5.3)
Sodium: 136 mEq/L — ABNORMAL LOW (ref 137–147)

## 2013-10-20 LAB — I-STAT TROPONIN, ED: TROPONIN I, POC: 0.02 ng/mL (ref 0.00–0.08)

## 2013-10-20 MED ORDER — LOSARTAN POTASSIUM 50 MG PO TABS
50.0000 mg | ORAL_TABLET | Freq: Two times a day (BID) | ORAL | Status: DC
  Administered 2013-10-20 – 2013-10-21 (×2): 50 mg via ORAL
  Filled 2013-10-20 (×3): qty 1

## 2013-10-20 MED ORDER — SODIUM CHLORIDE 0.9 % IV SOLN: 250.0000 mL | INTRAVENOUS | Status: DC | PRN

## 2013-10-20 MED ORDER — ONDANSETRON HCL 4 MG/2ML IJ SOLN: 4.0000 mg | Freq: Four times a day (QID) | INTRAMUSCULAR | Status: DC | PRN

## 2013-10-20 MED ORDER — AMIODARONE HCL 200 MG PO TABS
200.0000 mg | ORAL_TABLET | Freq: Every day | ORAL | Status: DC
  Administered 2013-10-21: 200 mg via ORAL
  Filled 2013-10-20: qty 1

## 2013-10-20 MED ORDER — PANTOPRAZOLE SODIUM 40 MG PO TBEC
40.0000 mg | DELAYED_RELEASE_TABLET | Freq: Two times a day (BID) | ORAL | Status: DC
  Administered 2013-10-20 – 2013-10-21 (×2): 40 mg via ORAL
  Filled 2013-10-20 (×2): qty 1

## 2013-10-20 MED ORDER — RANOLAZINE ER 500 MG PO TB12
500.0000 mg | ORAL_TABLET | Freq: Every day | ORAL | Status: DC
  Administered 2013-10-21: 500 mg via ORAL
  Filled 2013-10-20: qty 1

## 2013-10-20 MED ORDER — ASPIRIN EC 81 MG PO TBEC
81.0000 mg | DELAYED_RELEASE_TABLET | Freq: Every day | ORAL | Status: DC
  Administered 2013-10-21: 81 mg via ORAL
  Filled 2013-10-20: qty 1

## 2013-10-20 MED ORDER — SODIUM CHLORIDE 0.9 % IJ SOLN
3.0000 mL | Freq: Two times a day (BID) | INTRAMUSCULAR | Status: DC
  Administered 2013-10-21: 3 mL via INTRAVENOUS

## 2013-10-20 MED ORDER — METOPROLOL TARTRATE 50 MG PO TABS
50.0000 mg | ORAL_TABLET | Freq: Every day | ORAL | Status: DC
  Administered 2013-10-20: 50 mg via ORAL
  Filled 2013-10-20 (×2): qty 1

## 2013-10-20 MED ORDER — ONDANSETRON HCL 4 MG PO TABS: 4.0000 mg | ORAL_TABLET | Freq: Four times a day (QID) | ORAL | Status: DC | PRN

## 2013-10-20 MED ORDER — MIDODRINE HCL 2.5 MG PO TABS
2.5000 mg | ORAL_TABLET | Freq: Every day | ORAL | Status: DC
  Administered 2013-10-21: 2.5 mg via ORAL
  Filled 2013-10-20 (×2): qty 1

## 2013-10-20 MED ORDER — SODIUM CHLORIDE 0.9 % IJ SOLN: 3.0000 mL | INTRAMUSCULAR | Status: DC | PRN

## 2013-10-20 MED ORDER — SIMVASTATIN 20 MG PO TABS
20.0000 mg | ORAL_TABLET | Freq: Every day | ORAL | Status: DC
  Administered 2013-10-20: 20 mg via ORAL
  Filled 2013-10-20 (×2): qty 1

## 2013-10-20 MED ORDER — SODIUM CHLORIDE 0.9 % IJ SOLN
3.0000 mL | Freq: Two times a day (BID) | INTRAMUSCULAR | Status: DC
  Administered 2013-10-20 – 2013-10-21 (×2): 3 mL via INTRAVENOUS

## 2013-10-20 MED ORDER — ASPIRIN 81 MG PO TBEC: 81.0000 mg | DELAYED_RELEASE_TABLET | Freq: Every day | ORAL | Status: DC

## 2013-10-20 MED ORDER — LEVOTHYROXINE SODIUM 100 MCG PO TABS
100.0000 ug | ORAL_TABLET | Freq: Every day | ORAL | Status: DC
  Administered 2013-10-21: 100 ug via ORAL
  Filled 2013-10-20 (×2): qty 1

## 2013-10-20 NOTE — ED Notes (Signed)
Per EMS: pt from home with son c/o witness syncopal episode while sitting; pt sts "did not feel right" after event and is alert at present

## 2013-10-20 NOTE — ED Notes (Signed)
Patient transported back from CT 

## 2013-10-20 NOTE — ED Provider Notes (Signed)
CSN: [id number]     Arrival date & time 10/20/13  1644 History   First MD Initiated Contact with Patient 10/20/13 1657     Chief Complaint  Patient presents with  . Loss of Consciousness     (Consider location/radiation/quality/duration/timing/severity/associated sxs/prior Treatment) Patient is a 80 y.o. female presenting with syncope.  Loss of Consciousness Episode history:  Single Most recent episode:  Today Duration:  2 minutes Progression:  Partially resolved Chronicity:  New Context: sitting down   Witnessed: yes   Associated symptoms: nausea   Associated symptoms: no anxiety, no chest pain, no fever, no headaches, no seizures, no shortness of breath and no vomiting   Risk factors: coronary artery disease and vascular disease     Past Medical History  Diagnosis Date  . CHF (congestive heart failure)   . A-fib   . Hypothyroid   . GERD (gastroesophageal reflux disease)   . Hypothyroidism   . Bradycardia   . Pancreatitis   . Coronary artery disease   . Shingles 1980's    "twice; once on my front side; once on my back" (02/07/2012)  . CORONARY ARTERY DISEASE 11/28/2006    Qualifier: Diagnosis of  By: Tawanna Cooler MD, Eugenio Hoes   . Pacemaker   . Paroxysmal a-fib, history of 08/02/2011  . Tremor, essential 08/02/2011  . Esophageal disorder, with history of dilatation 08/02/2011  . Angina   . H/O hiatal hernia   . Neuromuscular disorder     essential tremors  . UTI (lower urinary tract infection) 08/03/2011  . Dizziness, near syncope 11/07/2011  . Macular degeneration, dry     "both eyes" (02/07/2012)  . Hypertension   . HTN (hypertension), accelerated this admit 11/08/2011  . Pneumonia 1927  . Syncope    Past Surgical History  Procedure Laterality Date  . Repair spigelian hernia    . S/p childbirth      x2  . Cataract extraction w/ intraocular lens  implant, bilateral  ~ 1997  . Vaginal hysterectomy  1970's  . Left oophorectomy  1970's  . Insert / replace / remove pacemaker   2000; 2009    initial placement; replaced  . Appendectomy  1933  . Cholecystectomy  1950's  . Hernia repair    . Esophageal dilation      "once" (02/07/2012)  . Cardiac catheterization  12/24/2005    No intervention - recommend medical therapy  . Cardiac catheterization  10/13/2006    80% proximal LAD stenosis, stented w/ a 3.5x67mm Cypher stent at 14atm (3.37mm), resulting in reduction of 80% to 0% residual.  . Cardiac catheterization  11/14/2006    No intervention - recommend medical therapy  . Cardiac catheterization  07/24/2007    No intervention.  . Cardiac catheterization  01/16/2010    75% proximal LAD stenosis w/ in-stent restenosis. No intervention. Dr Allyson Sabal will intervene on proximal LAD lesion.  . Cardiac catheterization  01/16/2010    75% proximal LAD stenosis, stented w/ a 3x11mm Promus DES at 16atm, resulting in reduction 75% to 0% residual  . Cardioversion  07/11/2010    Successful conversion to V paced verified via pacemaker.  . Cardiac catheterization  09/08/2010    LAD 75% in-stent restenosis, stented w/ a 3x55mm Resolute DES at 16atm (3.53mm), resulting in reduction of 75% stenosis  . Renal doppler  12/12/2010    Bilat proximal renal 1-59% diameter reduction. Bilat kidneys are normal.  . Cardiac catheterization  12/22/2010    No intervention - recommend medical  therapy.  . Cardiovascular stress test  08/09/2011    No scintigraphic evidence of inducible myocardial ischemia. Non-diagnostic for ischemia.  . Transthoracic echocardiogram  01/21/2012    EF 55-60%, mild-moderate tricuspid regurg.   Family History  Problem Relation Age of Onset  . Sudden death Mother    History  Substance Use Topics  . Smoking status: Never Smoker   . Smokeless tobacco: Never Used  . Alcohol Use: No   OB History   Grav Para Term Preterm Abortions TAB SAB Ect Mult Living                 Review of Systems  Constitutional: Negative for fever and chills.  HENT: Negative for sore throat.    Eyes: Negative for pain.  Respiratory: Negative for cough and shortness of breath.   Cardiovascular: Positive for syncope. Negative for chest pain.  Gastrointestinal: Positive for nausea. Negative for vomiting, abdominal pain and diarrhea.  Genitourinary: Negative for dysuria.  Musculoskeletal: Negative for back pain.  Skin: Negative for rash.  Neurological: Positive for light-headedness. Negative for seizures, numbness and headaches.      Allergies  Codeine; Imdur; Isosorbide mononitrate; Lorazepam; Prednisone; Penicillins; and Valium  Home Medications   Prior to Admission medications   Medication Sig Start Date End Date Taking? Authorizing Provider  amiodarone (PACERONE) 200 MG tablet Take 1 tablet (200 mg total) by mouth daily. 01/02/13  Yes Runell Gess, MD  aspirin 81 MG EC tablet Take 81 mg by mouth daily.     Yes Historical Provider, MD  levothyroxine (SYNTHROID, LEVOTHROID) 100 MCG tablet Take 1 tablet (100 mcg total) by mouth daily before breakfast. 06/29/13  Yes Mihai Croitoru, MD  loratadine (CLARITIN) 10 MG tablet Take 10 mg by mouth daily.     Yes Historical Provider, MD  losartan (COZAAR) 50 MG tablet Take 1 tablet (50 mg total) by mouth 2 (two) times daily. 05/25/13  Yes Mihai Croitoru, MD  metoprolol (LOPRESSOR) 50 MG tablet Take 1 tablet (50 mg total) by mouth at bedtime. 08/13/13  Yes Mihai Croitoru, MD  midodrine (PROAMATINE) 2.5 MG tablet Take 1 tablet (2.5 mg total) by mouth daily before breakfast. 08/13/13  Yes Mihai Croitoru, MD  Multiple Vitamins-Minerals (PRESERVISION AREDS 2) CAPS Take 1 capsule by mouth daily.    Yes Historical Provider, MD  nitroGLYCERIN (NITROSTAT) 0.4 MG SL tablet Place 0.4 mg under the tongue every 5 (five) minutes as needed for chest pain.    Yes Historical Provider, MD  polyethylene glycol (MIRALAX / GLYCOLAX) packet Take 17 g by mouth daily as needed (constipation). For constipation 02/14/13  Yes Marinda Elk, MD  ranolazine  (RANEXA) 500 MG 12 hr tablet Take 1 tablet (500 mg total) by mouth daily. 08/13/13  Yes Mihai Croitoru, MD  traMADol (ULTRAM) 50 MG tablet Take 25 mg by mouth at bedtime.   Yes Historical Provider, MD  trimethoprim (TRIMPEX) 100 MG tablet Take 100 mg by mouth at bedtime.   Yes Historical Provider, MD  pantoprazole (PROTONIX) 40 MG tablet Take 1 tablet (40 mg total) by mouth 2 (two) times daily. 10/21/13   Elease Etienne, MD  simvastatin (ZOCOR) 20 MG tablet Take 1 tablet (20 mg total) by mouth at bedtime. 10/21/13   Elease Etienne, MD   BP 158/50  Pulse 71  Temp(Src) 97.8 F (36.6 C) (Oral)  Resp 16  Ht 5\' 2"  (1.575 m)  Wt 143 lb (64.864 kg)  BMI 26.15 kg/m2  SpO2 99%  Physical Exam  Constitutional: She is oriented to person, place, and time. She appears well-developed and well-nourished. She appears ill. No distress.  HENT:  Head: Normocephalic and atraumatic.  Eyes: Pupils are equal, round, and reactive to light. Right eye exhibits no discharge. Left eye exhibits no discharge.  Neck: Normal range of motion.  Cardiovascular: Normal rate, regular rhythm and normal heart sounds.   Pulmonary/Chest: Effort normal and breath sounds normal.  Abdominal: Soft. She exhibits no distension. There is no tenderness.  Musculoskeletal: Normal range of motion.  Neurological: She is alert and oriented to person, place, and time.  Skin: Skin is warm. She is not diaphoretic.    ED Course  Procedures (including critical care time) Labs Review Labs Reviewed  CBC WITH DIFFERENTIAL - Abnormal; Notable for the following:    RDW 17.5 (*)    Lymphocytes Relative 11 (*)    All other components within normal limits  BASIC METABOLIC PANEL - Abnormal; Notable for the following:    Sodium 136 (*)    Glucose, Bld 108 (*)    GFR calc non Af Amer 57 (*)    GFR calc Af Amer 65 (*)    All other components within normal limits  BASIC METABOLIC PANEL - Abnormal; Notable for the following:    GFR calc non Af  Amer 73 (*)    GFR calc Af Amer 85 (*)    All other components within normal limits  CBC - Abnormal; Notable for the following:    Hemoglobin 11.8 (*)    RDW 17.4 (*)    All other components within normal limits  URINALYSIS, ROUTINE W REFLEX MICROSCOPIC - Abnormal; Notable for the following:    APPearance CLOUDY (*)    Nitrite POSITIVE (*)    Leukocytes, UA TRACE (*)    All other components within normal limits  URINE MICROSCOPIC-ADD ON - Abnormal; Notable for the following:    Bacteria, UA MANY (*)    All other components within normal limits  TROPONIN I  TROPONIN I  TROPONIN I  I-STAT TROPOININ, ED    Imaging Review Dg Chest 2 View  10/20/2013   CLINICAL DATA:  Syncope.  Shortness of breath.  Hypertension.  EXAM: CHEST  2 VIEW  COMPARISON:  02/13/2013  FINDINGS: Cardiomegaly noted with dual lead pacer and atherosclerotic calcification of the aortic arch and descending thoracic aorta.  No pulmonary edema. Scarring or subsegmental atelectasis peripherally at the left lung base.  Blunting of both posterior costophrenic angles. Bony demineralization. Coronary artery stent noted.  IMPRESSION: 1. Trace bilateral pleural effusions with subsegmental atelectasis at the left lung base. 2. Cardiomegaly without overt edema.  Dual lead pacer noted. 3. Atherosclerosis.   Electronically Signed   By: Herbie BaltimoreWalt  Liebkemann M.D.   On: 10/20/2013 18:26   Ct Head Wo Contrast  10/20/2013   CLINICAL DATA:  Syncopal episode  EXAM: CT HEAD WITHOUT CONTRAST  TECHNIQUE: Contiguous axial images were obtained from the base of the skull through the vertex without intravenous contrast.  COMPARISON:  02/13/2013  FINDINGS: Calvarium is intact. There is moderate diffuse atrophy. There is fairly severe low attenuation in the periventricular white matter diffusely. This is stable. There is no evidence of vascular territory infarct. There is no hemorrhage or extra-axial fluid. No evidence of mass or hydrocephalus.  IMPRESSION:  Severe chronic involutional change stable from prior study. No acute abnormalities.   Electronically Signed   By: Esperanza Heiraymond  Rubner M.D.   On: 10/20/2013 21:07  EKG Interpretation   Date/Time:  Tuesday October 20 2013 16:56:00 EDT Ventricular Rate:  81 PR Interval:  148 QRS Duration: 110 QT Interval:  428 QTC Calculation: 497 R Axis:   -81 Text Interpretation:  Sinus rhythm LVH with secondary repolarization  abnormality Left axis deviation Anterolateral infarct, age indeterminate  Baseline wander in lead(s) V2 Confirmed by Rhunette Croft, MD, Janey Genta [phone]) on  10/20/2013 5:24:02 PM      MDM   Final diagnoses:  Syncope, unspecified syncope type   80 yo F with hx of CHF, afib, CAD, pacemaker placement presents with witnessed syncope x3-4 minutes.   Upon arrival, patient HDS, in NAD. Son at the bedside, able to provide history. Patient elderly appearing, frail. No evidence of arrythmia upon arrival. Given pacemaker placement, will interrogate Medtronic pacer.   No change in troponin. Evidence of small pleural effusions on XR. Given patient age, comorbidities, and syncope at rest, will admit for continued monitoring / workup to hospitalist service. Admitted in stable condition. Patient seen and evaluated by myself and my attending, Dr. Rhunette Croft.       Imagene Sheller, MD 10/21/13 2009

## 2013-10-20 NOTE — ED Notes (Signed)
Admitting Md at the beside.

## 2013-10-20 NOTE — ED Notes (Signed)
Patient transported to CT 

## 2013-10-20 NOTE — ED Notes (Signed)
Pt. States, "I don't feel well"

## 2013-10-20 NOTE — H&P (Signed)
PCP:   Evette Georges, MD   Chief Complaint:  Passed out  HPI: 80 yo female lives with her son comes in after a sudden episode of syncope that occurred after sitting for some time.  Son says she said she didn't feel right, and then passed out for less than a minute than for about 5 minutes afterwards could not speak so he called ems.  No report of vitals during ems encounter.  Son says she was just saying "i dont feel right".  Pt still complains that she "doesnt feel right all over and is dizzy".  No recent illnesses except just completing macrobid for uti.  No fevers.  No n/v/d.  No abd pain, no cp.  No sob.  No cough.  No swelling or rashes.  No focal neuro deficits at this time, except the report of dizziness.  She has h/o orthostatic hypotension, and was hospitalized back in nov 14 for syncope due to orthostatics.  She does take mididrin.  No palpitations  Review of Systems:  Positive and negative as per HPI otherwise all other systems are negative  Past Medical History: Past Medical History  Diagnosis Date  . CHF (congestive heart failure)   . A-fib   . Hypothyroid   . GERD (gastroesophageal reflux disease)   . Hypothyroidism   . Bradycardia   . Pancreatitis   . Coronary artery disease   . Shingles 1980's    "twice; once on my front side; once on my back" (02/07/2012)  . CORONARY ARTERY DISEASE 11/28/2006    Qualifier: Diagnosis of  By: Tawanna Cooler MD, Eugenio Hoes   . Pacemaker   . Paroxysmal a-fib, history of 08/02/2011  . Tremor, essential 08/02/2011  . Esophageal disorder, with history of dilatation 08/02/2011  . Angina   . H/O hiatal hernia   . Neuromuscular disorder     essential tremors  . UTI (lower urinary tract infection) 08/03/2011  . Dizziness, near syncope 11/07/2011  . Macular degeneration, dry     "both eyes" (02/07/2012)  . Hypertension   . HTN (hypertension), accelerated this admit 11/08/2011  . Pneumonia 1927  . Syncope    Past Surgical History  Procedure Laterality  Date  . Repair spigelian hernia    . S/p childbirth      x2  . Cataract extraction w/ intraocular lens  implant, bilateral  ~ 1997  . Vaginal hysterectomy  1970's  . Left oophorectomy  1970's  . Insert / replace / remove pacemaker  2000; 2009    initial placement; replaced  . Appendectomy  1933  . Cholecystectomy  1950's  . Hernia repair    . Esophageal dilation      "once" (02/07/2012)  . Cardiac catheterization  12/24/2005    No intervention - recommend medical therapy  . Cardiac catheterization  10/13/2006    80% proximal LAD stenosis, stented w/ a 3.5x56mm Cypher stent at 14atm (3.17mm), resulting in reduction of 80% to 0% residual.  . Cardiac catheterization  11/14/2006    No intervention - recommend medical therapy  . Cardiac catheterization  07/24/2007    No intervention.  . Cardiac catheterization  01/16/2010    75% proximal LAD stenosis w/ in-stent restenosis. No intervention. Dr Allyson Sabal will intervene on proximal LAD lesion.  . Cardiac catheterization  01/16/2010    75% proximal LAD stenosis, stented w/ a 3x13mm Promus DES at 16atm, resulting in reduction 75% to 0% residual  . Cardioversion  07/11/2010    Successful conversion to  V paced verified via pacemaker.  . Cardiac catheterization  09/08/2010    LAD 75% in-stent restenosis, stented w/ a 3x57mm Resolute DES at 16atm (3.4mm), resulting in reduction of 75% stenosis  . Renal doppler  12/12/2010    Bilat proximal renal 1-59% diameter reduction. Bilat kidneys are normal.  . Cardiac catheterization  12/22/2010    No intervention - recommend medical therapy.  . Cardiovascular stress test  08/09/2011    No scintigraphic evidence of inducible myocardial ischemia. Non-diagnostic for ischemia.  . Transthoracic echocardiogram  01/21/2012    EF 55-60%, mild-moderate tricuspid regurg.    Medications: Prior to Admission medications   Medication Sig Start Date End Date Taking? Authorizing Provider  amiodarone (PACERONE) 200 MG tablet  Take 1 tablet (200 mg total) by mouth daily. 01/02/13   Runell Gess, MD  aspirin 81 MG EC tablet Take 81 mg by mouth daily.      Historical Provider, MD  levothyroxine (SYNTHROID, LEVOTHROID) 100 MCG tablet Take 1 tablet (100 mcg total) by mouth daily before breakfast. 06/29/13   Mihai Croitoru, MD  loratadine (CLARITIN) 10 MG tablet Take 10 mg by mouth daily.      Historical Provider, MD  losartan (COZAAR) 50 MG tablet Take 1 tablet (50 mg total) by mouth 2 (two) times daily. 05/25/13   Mihai Croitoru, MD  metoprolol (LOPRESSOR) 50 MG tablet Take 1 tablet (50 mg total) by mouth at bedtime. 08/13/13   Mihai Croitoru, MD  midodrine (PROAMATINE) 2.5 MG tablet Take 1 tablet (2.5 mg total) by mouth daily before breakfast. 08/13/13   Mihai Croitoru, MD  Multiple Vitamins-Minerals (PRESERVISION AREDS 2) CAPS Take 1 capsule by mouth daily.     Historical Provider, MD  nitroGLYCERIN (NITROSTAT) 0.4 MG SL tablet Place 0.4 mg under the tongue every 5 (five) minutes as needed for chest pain. For chest pain    Historical Provider, MD  pantoprazole (PROTONIX) 40 MG tablet Take 40 mg by mouth 2 (two) times daily.    Historical Provider, MD  polyethylene glycol (MIRALAX / GLYCOLAX) packet Take 17 g by mouth every other day. For constipation 02/14/13   Marinda Elk, MD  ranolazine (RANEXA) 500 MG 12 hr tablet Take 1 tablet (500 mg total) by mouth daily. 08/13/13   Mihai Croitoru, MD  simvastatin (ZOCOR) 20 MG tablet Take 1 tablet (20 mg total) by mouth at bedtime. 09/21/13   Roderick Pee, MD  traMADol (ULTRAM) 50 MG tablet Take 1 tablet (50 mg total) by mouth at bedtime. 06/29/13   Mihai Croitoru, MD  trimethoprim (TRIMPEX) 100 MG tablet Take 100 mg by mouth at bedtime.    Historical Provider, MD    Allergies:   Allergies  Allergen Reactions  . Codeine Other (See Comments)    Syncope   . Imdur [Isosorbide] Other (See Comments)    syncope  . Isosorbide Mononitrate Other (See Comments)    fainted  .  Lorazepam Other (See Comments)    hallucinations  . Prednisone Nausea And Vomiting  . Penicillins Rash  . Valium [Diazepam] Other (See Comments)    Reaction unknown    Social History:  reports that she has never smoked. She has never used smokeless tobacco. She reports that she does not drink alcohol or use illicit drugs.  Family History: Family History  Problem Relation Age of Onset  . Sudden death Mother     Physical Exam: Filed Vitals:   10/20/13 1700 10/20/13 1715 10/20/13 1730 10/20/13 1821  BP: 158/85 159/67 147/61   Pulse: 75 70 70   Temp:    98.6 F (37 C)  TempSrc:      Resp: 14 15 18    SpO2: 98% 96% 98%    General appearance: alert, cooperative and no distress Head: Normocephalic, without obvious abnormality, atraumatic Eyes: negative Nose: Nares normal. Septum midline. Mucosa normal. No drainage or sinus tenderness. Neck: no JVD and supple, symmetrical, trachea midline Lungs: clear to auscultation bilaterally Heart: regular rate and rhythm, S1, S2 normal, no murmur, click, rub or gallop Abdomen: soft, non-tender; bowel sounds normal; no masses,  no organomegaly Extremities: extremities normal, atraumatic, no cyanosis or edema Pulses: 2+ and symmetric Skin: Skin color, texture, turgor normal. No rashes or lesions Neurologic: Grossly normal  Labs on Admission:   Recent Labs  10/20/13 1749  NA 136*  K 4.1  CL 97  CO2 27  GLUCOSE 108*  BUN 15  CREATININE 0.87  CALCIUM 8.7    Recent Labs  10/20/13 1749  WBC 6.0  NEUTROABS 4.5  HGB 12.0  HCT 38.5  MCV 91.0  PLT 177   Radiological Exams on Admission: Dg Chest 2 View  10/20/2013   CLINICAL DATA:  Syncope.  Shortness of breath.  Hypertension.  EXAM: CHEST  2 VIEW  COMPARISON:  02/13/2013  FINDINGS: Cardiomegaly noted with dual lead pacer and atherosclerotic calcification of the aortic arch and descending thoracic aorta.  No pulmonary edema. Scarring or subsegmental atelectasis peripherally at the  left lung base.  Blunting of both posterior costophrenic angles. Bony demineralization. Coronary artery stent noted.  IMPRESSION: 1. Trace bilateral pleural effusions with subsegmental atelectasis at the left lung base. 2. Cardiomegaly without overt edema.  Dual lead pacer noted. 3. Atherosclerosis.   Electronically Signed   By: Herbie BaltimoreWalt  Liebkemann M.D.   On: 10/20/2013 18:26    Assessment/Plan  80 yo female with syncopal episode  Principal Problem:   Syncope-  ekg shows nsr nonpaced.  medtronic called to interogate pacer.  Serial enzymes overnight.  Ck orthostatics.  Ct head also is pending.  obs on telemetry.  Consider cardiology consult in am.    Active Problems:  Stable unless o/w noted.   Presenile dementia with depressive features   CORONARY ARTERY DISEASE   H/O cardiac pacemaker, Medtronic versa implanted 01/2008, though original implanted 2000 for syncope and heart block and ventricular asystole.   Paroxysmal a-fib, history of, off coumadin since 10/2010, without recurrence of afib.   Vertigo   Diastolic dysfunction, grade two   H/O orthostatic hypotension  obs on tele.   DAVID,RACHAL A 10/20/2013, 7:06 PM

## 2013-10-21 ENCOUNTER — Encounter (HOSPITAL_COMMUNITY): Payer: Self-pay | Admitting: *Deleted

## 2013-10-21 DIAGNOSIS — I10 Essential (primary) hypertension: Secondary | ICD-10-CM

## 2013-10-21 DIAGNOSIS — R55 Syncope and collapse: Secondary | ICD-10-CM | POA: Diagnosis not present

## 2013-10-21 LAB — BASIC METABOLIC PANEL
Anion gap: 9 (ref 5–15)
BUN: 11 mg/dL (ref 6–23)
CO2: 28 mEq/L (ref 19–32)
Calcium: 8.8 mg/dL (ref 8.4–10.5)
Chloride: 106 mEq/L (ref 96–112)
Creatinine, Ser: 0.71 mg/dL (ref 0.50–1.10)
GFR calc Af Amer: 85 mL/min — ABNORMAL LOW (ref >90)
GFR, EST NON AFRICAN AMERICAN: 73 mL/min — AB (ref >90)
GLUCOSE: 88 mg/dL (ref 70–99)
POTASSIUM: 4.3 meq/L (ref 3.7–5.3)
SODIUM: 143 meq/L (ref 137–147)

## 2013-10-21 LAB — CBC
HCT: 37.3 % (ref 36.0–46.0)
Hemoglobin: 11.8 g/dL — ABNORMAL LOW (ref 12.0–15.0)
MCH: 28.9 pg (ref 26.0–34.0)
MCHC: 31.6 g/dL (ref 30.0–36.0)
MCV: 91.2 fL (ref 78.0–100.0)
PLATELETS: 174 10*3/uL (ref 150–400)
RBC: 4.09 MIL/uL (ref 3.87–5.11)
RDW: 17.4 % — ABNORMAL HIGH (ref 11.5–15.5)
WBC: 5.5 10*3/uL (ref 4.0–10.5)

## 2013-10-21 LAB — TROPONIN I
Troponin I: <0.3 ng/mL (ref <0.30)
Troponin I: <0.3 ng/mL (ref <0.30)

## 2013-10-21 LAB — URINE MICROSCOPIC-ADD ON

## 2013-10-21 LAB — URINALYSIS, ROUTINE W REFLEX MICROSCOPIC
Bilirubin Urine: NEGATIVE
Glucose, UA: NEGATIVE mg/dL
Hgb urine dipstick: NEGATIVE
Ketones, ur: NEGATIVE mg/dL
Nitrite: POSITIVE — AB
Protein, ur: NEGATIVE mg/dL
Specific Gravity, Urine: 1.012 (ref 1.005–1.030)
Urobilinogen, UA: 0.2 mg/dL (ref 0.0–1.0)
pH: 8 (ref 5.0–8.0)

## 2013-10-21 MED ORDER — SIMVASTATIN 20 MG PO TABS: 20.0000 mg | ORAL_TABLET | Freq: Every day | ORAL | Status: AC

## 2013-10-21 MED ORDER — DEXTROSE 5 % IV SOLN
1.0000 g | INTRAVENOUS | Status: DC
  Administered 2013-10-21: 1 g via INTRAVENOUS
  Filled 2013-10-21: qty 10

## 2013-10-21 MED ORDER — PANTOPRAZOLE SODIUM 40 MG PO TBEC: 40.0000 mg | DELAYED_RELEASE_TABLET | Freq: Two times a day (BID) | ORAL | Status: DC

## 2013-10-21 NOTE — Evaluation (Signed)
Physical Therapy Evaluation Patient Details Name: [name] Luna MRN: [id number] DOB: 11-09-1920 Today's Date: 10/21/2013   History of Present Illness  Patient is a 80 y/o female admitted on 7/21 due to syncopal episode after sitting for a long time. Chest CT- trace B pleural effusions with atlectasis at left lung base. PMH positive for orthostatic hypotension, hospitalized 11/14 for syncope due to orthostatic hypotension, CHF, A-fib, bradycardia, CAD, shingles and pacemaker.   Clinical Impression  Patient presents with functional limitations due to deficits listed in PT problem list below. Pt with generalized weakness and self limiting mobility. Pt fatigues quickly. No LOB noted. Pt has 24/7 care at home and wants to be able to do more for herself and walk again, appropriate for HHPT. Pt would benefit from skilled PT to maximize independence and improve safe mobility to allow for safe discharge to below venue. Son agreeable to plan.    Follow Up Recommendations Supervision/Assistance - 24 hour;Home health PT    Equipment Recommendations  None recommended by PT    Recommendations for Other Services       Precautions / Restrictions Precautions Precautions: Fall Restrictions Weight Bearing Restrictions: No      Mobility  Bed Mobility Overal bed mobility: Needs Assistance Bed Mobility: Supine to Sit;Sit to Supine     Supine to sit: Min guard;HOB elevated Sit to supine: Min assist   General bed mobility comments: Min guard for safety to get to EOB with use of rails. Min A to get LLE into bed. Therapist stabilizing B feet to allow pt to scoot bottom up in bed.  Transfers Overall transfer level: Needs assistance Equipment used: Rolling walker (2 wheeled) Transfers: Sit to/from Stand Sit to Stand: Min guard         General transfer comment: Stood x2 from EOB with RW. VC for hand placement and technique.  Ambulation/Gait Ambulation/Gait assistance: Min guard Ambulation  Distance (Feet): 25 Feet (+40' with 1 seated rest break.) Assistive device: Rolling walker (2 wheeled) Gait Pattern/deviations: Decreased stride length;Trunk flexed   Gait velocity interpretation: Below normal speed for age/gender General Gait Details: Unsteady during turns. Min guard for safety. Fatigues quickly. Pt limited ambulation distance due to fear and "feeling weak."  Stairs            Wheelchair Mobility    Modified Rankin (Stroke Patients Only)       Balance Overall balance assessment: Needs assistance   Sitting balance-Leahy Scale: Fair     Standing balance support: Bilateral upper extremity supported;During functional activity Standing balance-Leahy Scale: Poor Standing balance comment: use of RW for support.                             Pertinent Vitals/Pain No SOB present during session. Vitals stable. Initial dizziness noted upon sitting EOB however resolved within <1 minute. No symptoms of orthostatic hypotension present during session. 0/10 pain reported.    Home Living Family/patient expects to be discharged to:: Private residence Living Arrangements: Children Available Help at Discharge: Family;Available 24 hours/day Type of Home: House Home Access: Stairs to enter Entrance Stairs-Rails: Right Entrance Stairs-Number of Steps: 2 Home Layout: One level Home Equipment: Walker - 2 wheels;Cane - single point;Bedside commode;Wheelchair - manual      Prior Function Level of Independence: Independent with assistive device(s)         Comments: Pt performs ADLs, son performs all IADLs. PTA, son has been pushing pt in w/c  to get to bathroom, however prior to this, pt ambulating in home with RW and transferring to w/c without assist. No falls reported.     Hand Dominance   Dominant Hand: Right    Extremity/Trunk Assessment   Upper Extremity Assessment: RUE deficits/detail;LUE deficits/detail RUE Deficits / Details: Resting tremor  present.     LUE Deficits / Details: Resting tremor present.   Lower Extremity Assessment: Generalized weakness         Communication   Communication: No difficulties  Cognition Arousal/Alertness: Awake/alert Behavior During Therapy: WFL for tasks assessed/performed Overall Cognitive Status: Within Functional Limits for tasks assessed                      General Comments      Exercises General Exercises - Lower Extremity Ankle Circles/Pumps: Both;10 reps;Seated Long Arc Quad: Both;15 reps;Seated Hip Flexion/Marching: Both;15 reps;Seated Toe Raises: Both;10 reps;Seated      Assessment/Plan    PT Assessment Patient needs continued PT services  PT Diagnosis Generalized weakness   PT Problem List Decreased strength;Decreased activity tolerance;Decreased safety awareness;Decreased balance;Decreased mobility;Cardiopulmonary status limiting activity  PT Treatment Interventions DME instruction;Balance training;Gait training;Functional mobility training;Patient/family education;Therapeutic activities;Therapeutic exercise   PT Goals (Current goals can be found in the Care Plan section) Acute Rehab PT Goals Patient Stated Goal: get so I can walk again. PT Goal Formulation: With patient/family Time For Goal Achievement: 11/04/13 Potential to Achieve Goals: Good    Frequency Min 3X/week   Barriers to discharge        Co-evaluation               End of Session Equipment Utilized During Treatment: Gait belt Activity Tolerance: Patient tolerated treatment well Patient left: in bed;with call bell/phone within reach;with bed alarm set;with family/visitor present Nurse Communication: Mobility status;Precautions    Functional Assessment Tool Used: Clinical judgment. Functional Limitation: Mobility: Walking and moving around Mobility: Walking and Moving Around Current Status [phone]): At least 1 percent but less than 20 percent impaired, limited or  restricted Mobility: Walking and Moving Around Goal Status [phone]): At least 1 percent but less than 20 percent impaired, limited or restricted    Time: 1319-1343 PT Time Calculation (min): 24 min   Charges:   PT Evaluation $Initial PT Evaluation Tier I: 1 Procedure PT Treatments $Therapeutic Activity: 8-22 mins   PT G Codes:   Functional Assessment Tool Used: Clinical judgment. Functional Limitation: Mobility: Walking and moving around    [place], [place] A 10/21/2013, 1:53 PM Alvie Heidelberg, PT, DPT [phone]

## 2013-10-21 NOTE — Progress Notes (Signed)
Patient has been up urinating frequently all night, and is now complaining of some pain with urination. NP on call made aware and an order for a urinalysis was received. Will continue to monitor.

## 2013-10-21 NOTE — Care Management Note (Signed)
    Page 1 of 1   10/21/2013     3:53:00 PM CARE MANAGEMENT NOTE 10/21/2013  Patient:  [name] Luna,[name] Luna   Account Number:  [id number]  Date Initiated:  10/21/2013  Documentation initiated by:  GRAVES-BIGELOW,Padraig Nhan  Subjective/Objective Assessment:   Pt admitted for syncope. Pt is from home with son.     Action/Plan:   [facility] Referral made and SOC to begin within 24-48 hours post d/c.   Anticipated DC Date:  10/21/2013   Anticipated DC Plan:  HOME W HOME HEALTH SERVICES      DC Planning Services  CM consult      Choice offered to / List presented to:          [facility]  HH-1 RN  HH-10 DISEASE MANAGEMENT  HH-2 PT      HH agency  [facility]   Status of service:  Completed, signed off Medicare Important Message given?  NO (If response is "NO", the following Medicare IM given date fields will be blank) Date Medicare IM given:   Medicare IM given by:   Date Additional Medicare IM given:   Additional Medicare IM given by:    Discharge Disposition:  HOME W HOME HEALTH SERVICES  Per UR Regulation:  Reviewed for med. necessity/level of care/duration of stay  If discussed at Long Length of Stay Meetings, dates discussed:    Comments:

## 2013-10-21 NOTE — Consult Note (Signed)
CARDIOLOGY CONSULT NOTE  Patient ID: [name] Luna, MRN: [id number], DOB/AGE: [birth year] 80 y.o. Admit date: 10/20/2013 Date of Consult: 10/21/2013  Primary Physician: Evette Georges, MD Primary Cardiologist: Dr Royann Shivers Referring Physician: Dr Waymon Amato  Chief Complaint: Syncope Reason for Consultation: Syncope  HPI: This is a 80 year old woman who was hospitalized yesterday after an episode of syncope. Cardiology is asked to see her for further evaluation and treatment.  The patient has had recurrent problems with orthostatic hypotension, dizziness, and syncope. She history of with Midodrine. The patient lives with her son in [place]. She was sitting on her couch yesterday afternoon and began to "feel bad." She describes the dizziness and diaphoresis. She then became unresponsive for a period of approximately one minute. Her son called EMS. By the time the phone call was completed, the patient had regained consciousness. She did not fall or injure herself. She experienced associated shortness of breath, but had no chest pain, chest pressure, or heart palpitations.  The patient has become increasingly frail. Her son pushes her in a wheelchair even around the house. She has a walker but is not able to use it much. Other problems include constipation and diarrhea. She denies any diarrhea over the last 48 hours. Her appetite has been somewhat diminished over the past few weeks. She only drinks about 16 ounces of water per day.  The patient's cardiac history includes coronary artery disease. She has undergone multiple coronary interventions on her LAD. She has had recurrent chest pain in the past, but this has been well-controlled since starting Ranexa. She also has paroxysmal atrial fibrillation and she is treated with amiodarone. She has not been anticoagulated since a spontaneous iliopsoas bleed. She's managed with aspirin monotherapy.  Past Medical History  Diagnosis Date  . CHF  (congestive heart failure)   . A-fib   . Hypothyroid   . GERD (gastroesophageal reflux disease)   . Hypothyroidism   . Bradycardia   . Pancreatitis   . Coronary artery disease   . Shingles 1980's    "twice; once on my front side; once on my back" (02/07/2012)  . CORONARY ARTERY DISEASE 11/28/2006    Qualifier: Diagnosis of  By: Tawanna Cooler MD, Eugenio Hoes   . Pacemaker   . Paroxysmal a-fib, history of 08/02/2011  . Tremor, essential 08/02/2011  . Esophageal disorder, with history of dilatation 08/02/2011  . Angina   . H/O hiatal hernia   . Neuromuscular disorder     essential tremors  . UTI (lower urinary tract infection) 08/03/2011  . Dizziness, near syncope 11/07/2011  . Macular degeneration, dry     "both eyes" (02/07/2012)  . Hypertension   . HTN (hypertension), accelerated this admit 11/08/2011  . Pneumonia 1927  . Syncope       Surgical History:  Past Surgical History  Procedure Laterality Date  . Repair spigelian hernia    . S/p childbirth      x2  . Cataract extraction w/ intraocular lens  implant, bilateral  ~ 1997  . Vaginal hysterectomy  1970's  . Left oophorectomy  1970's  . Insert / replace / remove pacemaker  2000; 2009    initial placement; replaced  . Appendectomy  1933  . Cholecystectomy  1950's  . Hernia repair    . Esophageal dilation      "once" (02/07/2012)  . Cardiac catheterization  12/24/2005    No intervention - recommend medical therapy  . Cardiac catheterization  10/13/2006    80% proximal  LAD stenosis, stented w/ a 3.5x2913mm Cypher stent at 14atm (3.2371mm), resulting in reduction of 80% to 0% residual.  . Cardiac catheterization  11/14/2006    No intervention - recommend medical therapy  . Cardiac catheterization  07/24/2007    No intervention.  . Cardiac catheterization  01/16/2010    75% proximal LAD stenosis w/ in-stent restenosis. No intervention. Dr Allyson SabalBerry will intervene on proximal LAD lesion.  . Cardiac catheterization  01/16/2010    75% proximal LAD  stenosis, stented w/ a 3x8112mm Promus DES at 16atm, resulting in reduction 75% to 0% residual  . Cardioversion  07/11/2010    Successful conversion to V paced verified via pacemaker.  . Cardiac catheterization  09/08/2010    LAD 75% in-stent restenosis, stented w/ a 3x6518mm Resolute DES at 16atm (3.2836mm), resulting in reduction of 75% stenosis  . Renal doppler  12/12/2010    Bilat proximal renal 1-59% diameter reduction. Bilat kidneys are normal.  . Cardiac catheterization  12/22/2010    No intervention - recommend medical therapy.  . Cardiovascular stress test  08/09/2011    No scintigraphic evidence of inducible myocardial ischemia. Non-diagnostic for ischemia.  . Transthoracic echocardiogram  01/21/2012    EF 55-60%, mild-moderate tricuspid regurg.     Home Meds: Prior to Admission medications   Medication Sig Start Date End Date Taking? Authorizing Provider  amiodarone (PACERONE) 200 MG tablet Take 1 tablet (200 mg total) by mouth daily. 01/02/13  Yes Runell GessJonathan J Berry, MD  aspirin 81 MG EC tablet Take 81 mg by mouth daily.     Yes Historical Provider, MD  levothyroxine (SYNTHROID, LEVOTHROID) 100 MCG tablet Take 1 tablet (100 mcg total) by mouth daily before breakfast. 06/29/13  Yes Mihai Croitoru, MD  loratadine (CLARITIN) 10 MG tablet Take 10 mg by mouth daily.     Yes Historical Provider, MD  losartan (COZAAR) 50 MG tablet Take 1 tablet (50 mg total) by mouth 2 (two) times daily. 05/25/13  Yes Mihai Croitoru, MD  metoprolol (LOPRESSOR) 50 MG tablet Take 1 tablet (50 mg total) by mouth at bedtime. 08/13/13  Yes Mihai Croitoru, MD  midodrine (PROAMATINE) 2.5 MG tablet Take 1 tablet (2.5 mg total) by mouth daily before breakfast. 08/13/13  Yes Mihai Croitoru, MD  Multiple Vitamins-Minerals (PRESERVISION AREDS 2) CAPS Take 1 capsule by mouth daily.    Yes Historical Provider, MD  nitroGLYCERIN (NITROSTAT) 0.4 MG SL tablet Place 0.4 mg under the tongue every 5 (five) minutes as needed for chest pain.     Yes Historical Provider, MD  polyethylene glycol (MIRALAX / GLYCOLAX) packet Take 17 g by mouth daily as needed (constipation). For constipation 02/14/13  Yes Marinda ElkAbraham Feliz Ortiz, MD  ranolazine (RANEXA) 500 MG 12 hr tablet Take 1 tablet (500 mg total) by mouth daily. 08/13/13  Yes Mihai Croitoru, MD  traMADol (ULTRAM) 50 MG tablet Take 25 mg by mouth at bedtime.   Yes Historical Provider, MD  trimethoprim (TRIMPEX) 100 MG tablet Take 100 mg by mouth at bedtime.   Yes Historical Provider, MD  nitrofurantoin, macrocrystal-monohydrate, (MACROBID) 100 MG capsule Take 100 mg by mouth 2 (two) times daily. 5 day course completed 10/19/13    Historical Provider, MD    Inpatient Medications:  . amiodarone  200 mg Oral Daily  . aspirin EC  81 mg Oral Daily  . cefTRIAXone (ROCEPHIN)  IV  1 g Intravenous Q24H  . levothyroxine  100 mcg Oral QAC breakfast  . losartan  50 mg Oral  BID  . metoprolol  50 mg Oral QHS  . midodrine  2.5 mg Oral QAC breakfast  . pantoprazole  40 mg Oral BID  . ranolazine  500 mg Oral Daily  . simvastatin  20 mg Oral QHS  . sodium chloride  3 mL Intravenous Q12H  . sodium chloride  3 mL Intravenous Q12H      Allergies:  Allergies  Allergen Reactions  . Codeine Other (See Comments)    Syncope   . Imdur [Isosorbide] Other (See Comments)    syncope  . Isosorbide Mononitrate Other (See Comments)    fainted  . Lorazepam Other (See Comments)    hallucinations  . Prednisone Nausea And Vomiting  . Penicillins Rash  . Valium [Diazepam] Other (See Comments)    Reaction unknown    History   Social History  . Marital Status: Widowed    Spouse Name: N/A    Number of Children: N/A  . Years of Education: N/A   Occupational History  . Not on file.   Social History Main Topics  . Smoking status: Never Smoker   . Smokeless tobacco: Never Used  . Alcohol Use: No  . Drug Use: No  . Sexual Activity: No   Other Topics Concern  . Not on file   Social History  Narrative  . No narrative on file     Family History  Problem Relation Age of Onset  . Sudden death Mother      Review of Systems: General: negative for chills, fever, night sweats  ENT: negative for rhinorrhea or epistaxis Cardiovascular: see HPI Dermatological: negative for rash Respiratory: negative for cough or wheezing, positive for shortness of breath GI: negative for bright red blood per rectum, melena, or hematemesis, see HPI GU: no hematuria, urgency. Positive for frequency Neurologic: negative for visual changes, headache. Positive for dizziness/syncope Heme: no easy bruising or bleeding Endo: negative for excessive thirst, thyroid disorder, or flushing Musculoskeletal: negative for joint pain or swelling, negative for myalgias  All other systems reviewed and are otherwise negative except as noted above.  Physical Exam: Blood pressure 184/61, pulse 71, temperature 98 F (36.7 C), temperature source Oral, resp. rate 15, SpO2 97.00%. General: Elderly frail-appearing woman in NAD, alert and oriented HEENT: Normocephalic, atraumatic, sclera non-icteric, no xanthomas, nares are without discharge.  Neck: Supple. Carotids 2+ without bruits. JVP normal Lungs: Clear bilaterally to auscultation without wheezes, rales, or rhonchi. Breathing is unlabored. Heart: RRR with normal S1 and S2. No murmurs, rubs, or gallops appreciated. Abdomen: Soft, non-tender, non-distended with normoactive bowel sounds. No hepatomegaly. No rebound/guarding. No obvious abdominal masses. Back: No CVA tenderness Msk:  Strength and tone appear normal for age. Extremities: No clubbing, cyanosis, or edema.  Distal pedal pulses are 2+ and equal bilaterally. Neuro: CNII-XII intact, moves all extremities spontaneously. Strength intact and equal bilaterally, resting tremor noted Psych:  Responds to questions appropriately with a normal affect.    Labs:  Recent Labs  10/20/13 2200 10/21/13 0337    TROPONINI <0.30 <0.30   Lab Results  Component Value Date   WBC 5.5 10/21/2013   HGB 11.8* 10/21/2013   HCT 37.3 10/21/2013   MCV 91.2 10/21/2013   PLT 174 10/21/2013    Recent Labs Lab 10/21/13 0337  NA 143  K 4.3  CL 106  CO2 28  BUN 11  CREATININE 0.71  CALCIUM 8.8  GLUCOSE 88   Lab Results  Component Value Date   CHOL 150 03/02/2013  HDL 58 03/02/2013   LDLCALC 74 03/02/2013   TRIG 91 03/02/2013   Lab Results  Component Value Date   DDIMER  Value: 1.76        AT THE INHOUSE ESTABLISHED CUTOFF VALUE OF 0.48 ug/mL FEU, THIS ASSAY HAS BEEN DOCUMENTED IN THE LITERATURE TO HAVE* 07/23/2007    Radiology/Studies:  Dg Chest 2 View  10/20/2013   CLINICAL DATA:  Syncope.  Shortness of breath.  Hypertension.  EXAM: CHEST  2 VIEW  COMPARISON:  02/13/2013  FINDINGS: Cardiomegaly noted with dual lead pacer and atherosclerotic calcification of the aortic arch and descending thoracic aorta.  No pulmonary edema. Scarring or subsegmental atelectasis peripherally at the left lung base.  Blunting of both posterior costophrenic angles. Bony demineralization. Coronary artery stent noted.  IMPRESSION: 1. Trace bilateral pleural effusions with subsegmental atelectasis at the left lung base. 2. Cardiomegaly without overt edema.  Dual lead pacer noted. 3. Atherosclerosis.   Electronically Signed   By: Herbie Baltimore M.D.   On: 10/20/2013 18:26   Ct Head Wo Contrast  10/20/2013   CLINICAL DATA:  Syncopal episode  EXAM: CT HEAD WITHOUT CONTRAST  TECHNIQUE: Contiguous axial images were obtained from the base of the skull through the vertex without intravenous contrast.  COMPARISON:  02/13/2013  FINDINGS: Calvarium is intact. There is moderate diffuse atrophy. There is fairly severe low attenuation in the periventricular white matter diffusely. This is stable. There is no evidence of vascular territory infarct. There is no hemorrhage or extra-axial fluid. No evidence of mass or hydrocephalus.  IMPRESSION:  Severe chronic involutional change stable from prior study. No acute abnormalities.   Electronically Signed   By: Esperanza Heir M.D.   On: 10/20/2013 21:07    EKG: AV sequential pacing  Cardiac Studies: 2D Echo 01/21/2012: Study Conclusions  - Left ventricle: The cavity size was normal. Wall thickness was normal. Systolic function was normal. The estimated ejection fraction was in the range of 55% to 60%. Wall motion was normal; there were no regional wall motion abnormalities. Features are consistent with a pseudonormal left ventricular filling pattern, with concomitant abnormal relaxation and increased filling pressure (grade 2 diastolic dysfunction). Doppler parameters are consistent with elevated mean left atrial filling pressure. - Aortic valve: Mild regurgitation. - Mitral valve: Calcified annulus. Mild regurgitation. - Right atrium: The atrium was mildly dilated. - Atrial septum: The septum bowed from left to right, consistent with increased left atrial pressure. No defect or patent foramen ovale was identified. - Tricuspid valve: Mild-moderate regurgitation. - Pulmonary arteries: Systolic pressure was mildly increased. PA peak pressure: 36mm Hg (S).  CXR 10/20/2013: IMPRESSION:  1. Trace bilateral pleural effusions with subsegmental atelectasis  at the left lung base.  2. Cardiomegaly without overt edema. Dual lead pacer noted.  3. Atherosclerosis.  ASSESSMENT AND PLAN:  1. Syncope, recurrent 2. CAD s/p PCI, no recent angina, negative cardiac markers this admission 3. Tachy-brady syndrome s/p PPM with normal device function this admission 4. Paroxysmal atrial fibrillation 5. Hypertension  Difficult management situation in elderly patient with resting hypertension. Symptoms typical for neurodepressor event. It's certainly possible that amiodarone and ranexa are exacerbating this problems, but patient was having frequent angina before starting ranexa and it is now  well-controlled. Amiodarone is controlling her atrial fibrillation and she is not a candidate for anticoagulation. I think best to treat conservatively, but suspect this will be a difficult problem to control as she becomes increasingly sedentary and frail. Discusses at length with the  patient and her son.   Recommend:  Increase fluids as much as tolerated  Lie down as soon as warning symptoms occur  Reverse Trendelenburg position when supine as tolerated  Compression stockings/TED hose as tolerated  Signed, Tonny Bollman MD 10/21/2013, 10:07 AM

## 2013-10-21 NOTE — Discharge Summary (Signed)
Physician Discharge Summary  [name] Luna:[id number] DOB: 1920-09-22 DOA: 10/20/2013  PCP: Evette Georges, MD  Admit date: 10/20/2013 Discharge date: 10/21/2013  Time spent: Less than 30 minutes  Recommendations for Outpatient Follow-up:  1. Dr. Tinnie Gens Todd/PCP in 1 week. Informed son to make appointment. 2. Home health RN and PT.  Discharge Diagnoses:  Principal Problem:   Syncope Active Problems:   Presenile dementia with depressive features   CORONARY ARTERY DISEASE   H/O cardiac pacemaker, [employer] versa implanted 01/2008, though original implanted 2000 for syncope and heart block and ventricular asystole.   Paroxysmal a-fib, history of, off coumadin since 10/2010, without recurrence of afib.   Vertigo   Diastolic dysfunction, grade two   H/O orthostatic hypotension   Discharge Condition: Improved & Stable  Diet recommendation: Heart Healthy diet.  Filed Weights   10/21/13 1143  Weight: 64.864 kg (143 lb)    History of present illness and hospital course:  80 year old female patient with history of recurrent problems with orthostatic hypotension-on midodrine, chronic CHF, A. fib-on amiodarone but not on anticoagulation secondary to ileo psoas bleed, hypothyroid, bradycardia status post PPM, CAD, HTN, frequent UTIs on urinary prophylaxis with trimethoprim, presented to the ED with an episode of syncope. She lives with her son. She was sitting on her couch on the day of admission when she began "feeling bad" which she described as dizziness and diaphoresis. She then became unresponsive for approximately a minute. Her son called EMS but patient promptly regained consciousness. She did not fall or injure herself. There was no associated chest pain or palpitations. As per son, patient has become increasingly frail. This note reported history of nausea, vomiting or diarrhea. She was admitted to telemetry which showed AV paced rhythm. Orthostatic blood pressure checks were  negative. Cardiology was consulted and stated that this was a difficult management situation in this elderly patient with resting hypertension. Her symptoms were most likely vasovagal versus orthostatic in nature. Although it was felt that amiodarone and Ranexa were exacerbating this problem, patient's frequent angina has been well controlled on Ranexa and A. fib well controlled on amiodarone. Cardiology recommended treating her conservatively with increased by mouth fluid intake as much as tolerated, lying down as soon as warning symptoms occur, compression stockings to lower extremities and patient and son have been counseled regarding gradual change in position from lying to sitting, sitting to standing and then ambulating. They verbalized understanding. May consider outpatient carotid Dopplers if further evaluation is deemed necessary. PT saw patient and recommended home health PT. 2-D echo in 2013 showed LVEF of 55-60% with mild aortic valve regurgitation. As per son, patient is being followed up by urology for frequent UTIs and has been on trimethoprim prophylaxis for the last one year. She was recently treated with one week course of Macrobid. Urology office has sent off urine culture today prior to this admission. Patient denies dysuria, urinary frequency, fever, chills or abdominal pain. Even though IV Rocephin was started for presumed UTI, she probably has  Asymptomatic bacteriuria and will not be discharged on antibiotics. Anemia seems to be chronic and stable.   Consultations:  Cardiology  Procedures:  None    Discharge Exam:  Complaints:  Patient denied specific complaints.  Filed Vitals:   10/20/13 2138 10/21/13 0526 10/21/13 1143 10/21/13 1404  BP: 184/62 184/61  158/50  Pulse: 68 71  71  Temp: 98 F (36.7 C) 98 F (36.7 C)  97.8 F (36.6 C)  TempSrc: Oral  Oral  Resp: 16 15  16   Height:   5\' 2"  (1.575 m)   Weight:   64.864 kg (143 lb)   SpO2: 98% 97%  99%    General  exam: Elderly frail female patient lying comfortably in bed. Respiratory system: Clear. No increased work of breathing. Cardiovascular system: S1 & S2 heard, RRR. No JVD, murmurs, gallops, clicks or pedal edema. Telemetry: AV paced rhythm. Gastrointestinal system: Abdomen is nondistended, soft and nontender. Normal bowel sounds heard. Central nervous system: Alert and oriented. No focal neurological deficits. Extremities: Symmetric 5 x 5 power.  Discharge Instructions      Discharge Instructions   Call MD for:  extreme fatigue    Complete by:  As directed      Call MD for:  persistant dizziness or light-headedness    Complete by:  As directed      Call MD for:    Complete by:  As directed   Passing out episodes.     Diet - low sodium heart healthy    Complete by:  As directed      Discharge instructions    Complete by:  As directed   Use compression stockings on both legs up to thigh, especially before walking or standing.     Increase activity slowly    Complete by:  As directed             Medication List    STOP taking these medications       nitrofurantoin (macrocrystal-monohydrate) 100 MG capsule  Commonly known as:  MACROBID      TAKE these medications       amiodarone 200 MG tablet  Commonly known as:  PACERONE  Take 1 tablet (200 mg total) by mouth daily.     aspirin 81 MG EC tablet  Take 81 mg by mouth daily.     levothyroxine 100 MCG tablet  Commonly known as:  SYNTHROID, LEVOTHROID  Take 1 tablet (100 mcg total) by mouth daily before breakfast.     loratadine 10 MG tablet  Commonly known as:  CLARITIN  Take 10 mg by mouth daily.     losartan 50 MG tablet  Commonly known as:  COZAAR  Take 1 tablet (50 mg total) by mouth 2 (two) times daily.     metoprolol 50 MG tablet  Commonly known as:  LOPRESSOR  Take 1 tablet (50 mg total) by mouth at bedtime.     midodrine 2.5 MG tablet  Commonly known as:  PROAMATINE  Take 1 tablet (2.5 mg total) by  mouth daily before breakfast.     nitroGLYCERIN 0.4 MG SL tablet  Commonly known as:  NITROSTAT  Place 0.4 mg under the tongue every 5 (five) minutes as needed for chest pain.     pantoprazole 40 MG tablet  Commonly known as:  PROTONIX  Take 1 tablet (40 mg total) by mouth 2 (two) times daily.     polyethylene glycol packet  Commonly known as:  MIRALAX / GLYCOLAX  Take 17 g by mouth daily as needed (constipation). For constipation     PRESERVISION AREDS 2 Caps  Take 1 capsule by mouth daily.     ranolazine 500 MG 12 hr tablet  Commonly known as:  RANEXA  Take 1 tablet (500 mg total) by mouth daily.     simvastatin 20 MG tablet  Commonly known as:  ZOCOR  Take 1 tablet (20 mg total) by mouth at bedtime.  traMADol 50 MG tablet  Commonly known as:  ULTRAM  Take 25 mg by mouth at bedtime.     trimethoprim 100 MG tablet  Commonly known as:  TRIMPEX  Take 100 mg by mouth at bedtime.          The results of significant diagnostics from this hospitalization (including imaging, microbiology, ancillary and laboratory) are listed below for reference.    Significant Diagnostic Studies: Dg Chest 2 View  10/20/2013   CLINICAL DATA:  Syncope.  Shortness of breath.  Hypertension.  EXAM: CHEST  2 VIEW  COMPARISON:  02/13/2013  FINDINGS: Cardiomegaly noted with dual lead pacer and atherosclerotic calcification of the aortic arch and descending thoracic aorta.  No pulmonary edema. Scarring or subsegmental atelectasis peripherally at the left lung base.  Blunting of both posterior costophrenic angles. Bony demineralization. Coronary artery stent noted.  IMPRESSION: 1. Trace bilateral pleural effusions with subsegmental atelectasis at the left lung base. 2. Cardiomegaly without overt edema.  Dual lead pacer noted. 3. Atherosclerosis.   Electronically Signed   By: Herbie Baltimore M.D.   On: 10/20/2013 18:26   Ct Head Wo Contrast  10/20/2013   CLINICAL DATA:  Syncopal episode  EXAM: CT HEAD  WITHOUT CONTRAST  TECHNIQUE: Contiguous axial images were obtained from the base of the skull through the vertex without intravenous contrast.  COMPARISON:  02/13/2013  FINDINGS: Calvarium is intact. There is moderate diffuse atrophy. There is fairly severe low attenuation in the periventricular white matter diffusely. This is stable. There is no evidence of vascular territory infarct. There is no hemorrhage or extra-axial fluid. No evidence of mass or hydrocephalus.  IMPRESSION: Severe chronic involutional change stable from prior study. No acute abnormalities.   Electronically Signed   By: Esperanza Heir M.D.   On: 10/20/2013 21:07    Microbiology: No results found for this or any previous visit (from the past 240 hour(s)).   Labs: Basic Metabolic Panel:  Recent Labs Lab 10/20/13 1749 10/21/13 0337  NA 136* 143  K 4.1 4.3  CL 97 106  CO2 27 28  GLUCOSE 108* 88  BUN 15 11  CREATININE 0.87 0.71  CALCIUM 8.7 8.8   Liver Function Tests: No results found for this basename: AST, ALT, ALKPHOS, BILITOT, PROT, ALBUMIN,  in the last 168 hours No results found for this basename: LIPASE, AMYLASE,  in the last 168 hours No results found for this basename: AMMONIA,  in the last 168 hours CBC:  Recent Labs Lab 10/20/13 1749 10/21/13 0337  WBC 6.0 5.5  NEUTROABS 4.5  --   HGB 12.0 11.8*  HCT 38.5 37.3  MCV 91.0 91.2  PLT 177 174   Cardiac Enzymes:  Recent Labs Lab 10/20/13 2200 10/21/13 0337 10/21/13 0931  TROPONINI <0.30 <0.30 <0.30   BNP: BNP (last 3 results)  Recent Labs  02/13/13 1351  PROBNP 507.6*   CBG: No results found for this basename: GLUCAP,  in the last 168 hours    Signed:  Marcellus Scott, MD, FACP, FHM. [facility] Pager [phone]  If 7PM-7AM, please contact night-coverage [email or web address] Password [facility] 10/21/2013, 2:41 PM

## 2013-10-22 NOTE — ED Provider Notes (Signed)
I saw and evaluated the patient, reviewed the resident's note and I agree with the findings and plan.   EKG Interpretation   Date/Time:  Tuesday October 20 2013 16:56:00 EDT Ventricular Rate:  81 PR Interval:  148 QRS Duration: 110 QT Interval:  428 QTC Calculation: 497 R Axis:   -81 Text Interpretation:  Sinus rhythm LVH with secondary repolarization  abnormality Left axis deviation Anterolateral infarct, age indeterminate  Baseline wander in lead(s) V2 Confirmed by Rhunette CroftNANAVATI, MD, Janey GentaANKIT [phone]([id number]) on  10/20/2013 5:24:02 PM       Pt wit syncope. Advanced heart disease. Will admit. Cardiovasc and neuro exam non acute. Device interrogation is neg. Obs admission.  Derwood KaplanAnkit Donaciano Range, MD 10/22/13 [phone]

## 2013-10-29 ENCOUNTER — Ambulatory Visit (INDEPENDENT_AMBULATORY_CARE_PROVIDER_SITE_OTHER): Payer: Medicare Other | Admitting: Family Medicine

## 2013-10-29 ENCOUNTER — Encounter: Payer: Self-pay | Admitting: Family Medicine

## 2013-10-29 VITALS — BP 116/72 | HR 60 | Temp 98.3°F | Wt 143.0 lb

## 2013-10-29 DIAGNOSIS — R55 Syncope and collapse: Secondary | ICD-10-CM

## 2013-10-29 NOTE — Assessment & Plan Note (Signed)
No recurrence since day of hospitalization. ALready evaluated by cardiology and no changes. Encouraged continued conservative measures. Will f/u if recurs.

## 2013-10-29 NOTE — Patient Instructions (Signed)
I am glad you have done so well since getting out of the hospital. I agree I don't love the lower blood pressure but I do not want to change it as I don't want the pressure to skyrocket as it has in the past. If you have symptoms again such as lightheadedness I would do the conservative measures of laying down as quickly as you can. If this is a recurrent issue and the blood pressure remains on the lower end, we would like to see you back.   See Dr. Tawanna Coolerodd back in about 2-3 months if doing well otherwise.

## 2013-10-29 NOTE — Progress Notes (Signed)
Tana ConchStephen Jaleyah Longhi, MD Phone: [phone]  Subjective:   [name] HarrisonWinifred I Luna is a 80 y.o. year old very pleasant female patient who presents with the following:  Hospital follow up for syncopal episode Recurrent issues with orthostatic hypotension on midodrine. Patient presented to hospital after a syncopal episode lasting 1 minute while sitting on the cough. Cardiology consulted and thought ranexa and amiodarone could be contributing but they did nto want to remove them due to comorbidities of angina and a fib. Plan was for conservative management with no medication changes, increased PO intake, and slow changes in position. Patient also continues to follow with urology for recurrent UTI but in hospital thought simply to have asymptomatic bacteruria.   Today, patient and son state that she has done much better since hospital discharge. She has worked to stay hydrated and to change positions slowly. Her blood pressure has been slightly lower than normal at around a systolic of 120 and she felt slightly weak yesterday. She feels better today despite similar blood pressure. Home health RN and PT have been working with patient and she has been doing well with this. She has walked some at home.  ROS- denies chest pain or shortness of breath   Past Medical History-She has a complicated history including CHF, a fib on amio but with no anticoagulation due to prior bleed, bradycardia s/p pacemaker, CAD, frequent UTI on prophylaxis with trimethoprim. Orthostatics did not show orthostatic blood pressures.   Medications- reviewed and updated Current Outpatient Prescriptions  Medication Sig Dispense Refill  . amiodarone (PACERONE) 200 MG tablet Take 1 tablet (200 mg total) by mouth daily.  30 tablet  11  . aspirin 81 MG EC tablet Take 81 mg by mouth daily.        [name]. levothyroxine (SYNTHROID, LEVOTHROID) 100 MCG tablet Take 1 tablet (100 mcg total) by mouth daily before breakfast.  90 tablet  2  . loratadine  (CLARITIN) 10 MG tablet Take 10 mg by mouth daily.        [name]. losartan (COZAAR) 50 MG tablet Take 1 tablet (50 mg total) by mouth 2 (two) times daily.  180 tablet  2  . metoprolol (LOPRESSOR) 50 MG tablet Take 1 tablet (50 mg total) by mouth at bedtime.  30 tablet  11  . midodrine (PROAMATINE) 2.5 MG tablet Take 1 tablet (2.5 mg total) by mouth daily before breakfast.  30 tablet  11  . Multiple Vitamins-Minerals (PRESERVISION AREDS 2) CAPS Take 1 capsule by mouth daily.       . nitroGLYCERIN (NITROSTAT) 0.4 MG SL tablet Place 0.4 mg under the tongue every 5 (five) minutes as needed for chest pain.       . pantoprazole (PROTONIX) 40 MG tablet Take 1 tablet (40 mg total) by mouth 2 (two) times daily.  30 tablet  0  . polyethylene glycol (MIRALAX / GLYCOLAX) packet Take 17 g by mouth daily as needed (constipation). For constipation      . ranolazine (RANEXA) 500 MG 12 hr tablet Take 1 tablet (500 mg total) by mouth daily.  30 tablet  11  . simvastatin (ZOCOR) 20 MG tablet Take 1 tablet (20 mg total) by mouth at bedtime.  30 tablet  0  . traMADol (ULTRAM) 50 MG tablet Take 25 mg by mouth at bedtime.      [name]. trimethoprim (TRIMPEX) 100 MG tablet Take 100 mg by mouth at bedtime.       No current facility-administered medications for this visit.  Objective: BP 116/72  Pulse 60  Temp(Src) 98.3 F (36.8 C)  Wt 143 lb (64.864 kg) Gen: NAD, resting comfortably in wheelchair, elderly female appears stated age CV: RRR no murmurs rubs or gallops, no jvd Lungs: CTAB no crackles, wheeze, rhonchi, nonlabored Abdomen: soft/nontender/nondistended Ext: no edema Skin: warm, dry Neuro: grossly normal, moves all extremities  Assessment/Plan:  Syncope No recurrence since day of hospitalization. ALready evaluated by cardiology and no changes. Encouraged continued conservative measures. Will f/u if recurs.

## 2013-11-05 DIAGNOSIS — I5032 Chronic diastolic (congestive) heart failure: Secondary | ICD-10-CM

## 2013-11-05 DIAGNOSIS — I509 Heart failure, unspecified: Secondary | ICD-10-CM

## 2013-11-05 DIAGNOSIS — R42 Dizziness and giddiness: Secondary | ICD-10-CM

## 2013-11-05 DIAGNOSIS — I951 Orthostatic hypotension: Secondary | ICD-10-CM

## 2013-11-06 ENCOUNTER — Telehealth: Payer: Self-pay | Admitting: Cardiovascular Disease

## 2013-11-06 NOTE — Telephone Encounter (Signed)
Duwayne HeckDanielle states that [name] had been hospitalized recently for dizziness.  She is with her today and her resting blood pressure is 115/60 and standing it is 100/58.  Duwayne HeckDanielle has questions regarding the blood pressure medications that the patient is taking.

## 2013-11-06 NOTE — Telephone Encounter (Signed)
Spoke with Duwayne HeckDanielle - she reports that patient is having dizziness and has had this during recent hospitalization and after it. Danielle advised son to check BP anytime she gets dizzy. They are concerned her BP is too low. Advised Duwayne HeckDanielle it would be hard to make medication changes without having a series of BP readings and/or an appointment, which would probably be the best option since her last OV was in May and she has no hospital follow up scheduled.   Left VM for son to return call to discuss this with him

## 2013-11-06 NOTE — Telephone Encounter (Signed)
Spoke with patient's son. He reports she has had a couple dizzy spells that occur during the day, even while sitting. She lays flat for 30 or so minutes and then feels better. [facility] reports that her BP is lower than it typically has been. Son and nurse agreed that patient should come in for eval with extender next week for dizziness. Advised him to take her BP at least twice daily and also take when she has dizzy spells. He agreed with plan.   Message deferred to scheduler to set up appmt with APP for dizziness.

## 2013-11-06 NOTE — Telephone Encounter (Signed)
LMTCB

## 2013-11-09 ENCOUNTER — Ambulatory Visit (INDEPENDENT_AMBULATORY_CARE_PROVIDER_SITE_OTHER): Payer: Medicare Other | Admitting: Physician Assistant

## 2013-11-09 ENCOUNTER — Encounter: Payer: Self-pay | Admitting: Physician Assistant

## 2013-11-09 VITALS — BP 142/70 | HR 80 | Ht 62.0 in | Wt 143.0 lb

## 2013-11-09 DIAGNOSIS — I10 Essential (primary) hypertension: Secondary | ICD-10-CM

## 2013-11-09 DIAGNOSIS — I2510 Atherosclerotic heart disease of native coronary artery without angina pectoris: Secondary | ICD-10-CM

## 2013-11-09 DIAGNOSIS — R42 Dizziness and giddiness: Secondary | ICD-10-CM

## 2013-11-09 NOTE — Progress Notes (Signed)
Date:  11/09/2013   ID:  [name] Luna, DOB 05-14-1920, MRN [id number]  PCP:  Evette GeorgesDD,JEFFREY ALLEN, MD  Primary Cardiologist:  Croitoru     History of Present Illness: [name] Luna is a 80 y.o. female  This is a 80 year old woman who was hospitalized July 20 after a episode of syncope.  The patient has had recurrent problems with orthostatic hypotension, dizziness, and syncope. She  Been treated with  Midodrine. The [place]. She was sitting on her couch  and began to "feel bad." She describes the dizziness and diaphoresis. She then became unresponsive for a period of approximately one minute. Her son called EMS. By the time the phone call was completed, the patient had regained consciousness. She did not fall or injure herself. She experienced associated shortness of breath, but had no chest pain, chest pressure, or heart palpitations.   The patient has become increasingly frail. Her son pushes her in a wheelchair even around the house. She has a walker but is not able to use it much.  Her appetite has been somewhat diminished over the past few weeks. She only drinks about 16 ounces of water per day.   The patient's cardiac history includes coronary artery disease. She has undergone multiple coronary interventions on her LAD. She has had recurrent chest pain in the past, but this has been well-controlled since starting Ranexa. She also has paroxysmal atrial fibrillation and she is treated with amiodarone. She has not been anticoagulated since a spontaneous iliopsoas bleed. She's managed with aspirin monotherapy.  Patient presents today for post hospital evaluation.  She reports doing well. She's had 2 episodes of dizziness since discharge.  He recently her blood pressure was 115 which is not normal for her. The RN that comes to her house recommended they call the office.  The patient currently denies nausea, vomiting, fever, chest pain, shortness of breath,  orthopnea, PND, cough, congestion,  lower extremity edema.  Wt Readings from Last 3 Encounters:  11/09/13 143 lb (64.864 kg)  10/29/13 143 lb (64.864 kg)  10/21/13 143 lb (64.864 kg)     Past Medical History  Diagnosis Date  . CHF (congestive heart failure)   . A-fib   . Hypothyroid   . GERD (gastroesophageal reflux disease)   . Hypothyroidism   . Bradycardia   . Pancreatitis   . Coronary artery disease   . Shingles 1980's    "twice; once on my front side; once on my back" (02/07/2012)  . CORONARY ARTERY DISEASE 11/28/2006    Qualifier: Diagnosis of  By: Tawanna Coolerodd MD, Eugenio HoesJeffrey A   . Pacemaker   . Paroxysmal a-fib, history of 08/02/2011  . Tremor, essential 08/02/2011  . Esophageal disorder, with history of dilatation 08/02/2011  . Angina   . H/O hiatal hernia   . Neuromuscular disorder     essential tremors  . UTI (lower urinary tract infection) 08/03/2011  . Dizziness, near syncope 11/07/2011  . Macular degeneration, dry     "both eyes" (02/07/2012)  . Hypertension   . HTN (hypertension), accelerated this admit 11/08/2011  . Pneumonia 1927  . Syncope     Current Outpatient Prescriptions  Medication Sig Dispense Refill  . amiodarone (PACERONE) 200 MG tablet Take 1 tablet (200 mg total) by mouth daily.  30 tablet  11  . aspirin 81 MG EC tablet Take 81 mg by mouth daily.        [name]. levothyroxine (SYNTHROID, LEVOTHROID)  100 MCG tablet Take 1 tablet (100 mcg total) by mouth daily before breakfast.  90 tablet  2  . loratadine (CLARITIN) 10 MG tablet Take 10 mg by mouth daily.        [name] losartan (COZAAR) 50 MG tablet Take 1 tablet (50 mg total) by mouth 2 (two) times daily.  180 tablet  2  . metoprolol (LOPRESSOR) 50 MG tablet Take 1 tablet (50 mg total) by mouth at bedtime.  30 tablet  11  . midodrine (PROAMATINE) 2.5 MG tablet Take 1 tablet (2.5 mg total) by mouth daily before breakfast.  30 tablet  11  . Multiple Vitamins-Minerals (PRESERVISION AREDS 2) CAPS Take 1 capsule by mouth daily.         . nitroGLYCERIN (NITROSTAT) 0.4 MG SL tablet Place 0.4 mg under the tongue every 5 (five) minutes as needed for chest pain.       . pantoprazole (PROTONIX) 40 MG tablet Take 1 tablet (40 mg total) by mouth 2 (two) times daily.  30 tablet  0  . polyethylene glycol (MIRALAX / GLYCOLAX) packet Take 17 g by mouth daily as needed (constipation). For constipation      . ranolazine (RANEXA) 500 MG 12 hr tablet Take 1 tablet (500 mg total) by mouth daily.  30 tablet  11  . simvastatin (ZOCOR) 20 MG tablet Take 1 tablet (20 mg total) by mouth at bedtime.  30 tablet  0  . traMADol (ULTRAM) 50 MG tablet Take 25 mg by mouth at bedtime.      [name] trimethoprim (TRIMPEX) 100 MG tablet Take 100 mg by mouth at bedtime.       No current facility-administered medications for this visit.    Allergies:    Allergies  Allergen Reactions  . Codeine Other (See Comments)    Syncope   . Imdur [Isosorbide] Other (See Comments)    syncope  . Isosorbide Mononitrate Other (See Comments)    fainted  . Lorazepam Other (See Comments)    hallucinations  . Prednisone Nausea And Vomiting  . Penicillins Rash  . Valium [Diazepam] Other (See Comments)    Reaction unknown    Social History:  The patient  reports that she has never smoked. She has never used smokeless tobacco. She reports that she does not drink alcohol or use illicit drugs.   Family history:   Family History  Problem Relation Age of Onset  . Sudden death Mother     ROS:  Please see the history of present illness.  All other systems reviewed and negative.   PHYSICAL EXAM: VS:  BP 142/70  Pulse 80  Ht 5\' 2"  (1.575 m)  Wt 143 lb (64.864 kg)  BMI 26.15 kg/m2 Well nourished, well developed, in no acute distress HEENT: Pupils are equal round react to light accommodation extraocular movements are intact.  Neck: no JVDNo cervical lymphadenopathy. Cardiac: Regular rate and rhythm with1/6 sys mm. Lungs:  clear to auscultation bilaterally, no wheezing,  rhonchi or rales  Ext: Trace lower extremity edema.  2+ radial and dorsalis pedis pulses. Skin: warm and dry Neuro:  Grossly normal  EKG:  None   ASSESSMENT AND PLAN:  Problem List Items Addressed This Visit   HYPERTENSION - Primary     Given the patient's history of dizziness, her blood pressure should remain 140s to 150s.    Dizziness, near syncope     Currently the patient feels fine she's had 2 episodes of dizziness since her discharge. I  have asked her son to check her blood pressure if and when she has another episode.

## 2013-11-09 NOTE — Patient Instructions (Signed)
1.  Check blood pressure when a dizziness episode occurs.  2.  Follow up with Dr. Royann Shiversroitoru in November

## 2013-11-09 NOTE — Assessment & Plan Note (Signed)
Given the patient's history of dizziness, her blood pressure should remain 140s to 150s.

## 2013-11-09 NOTE — Assessment & Plan Note (Signed)
Currently the patient feels fine she's had 2 episodes of dizziness since her discharge. I have asked her son to check her blood pressure if and when she has another episode.

## 2013-11-11 NOTE — Telephone Encounter (Signed)
Patient have OV 8/10 with Brun[place]ryan, [place]

## 2013-11-12 ENCOUNTER — Ambulatory Visit (INDEPENDENT_AMBULATORY_CARE_PROVIDER_SITE_OTHER): Payer: Medicare Other | Admitting: *Deleted

## 2013-11-12 DIAGNOSIS — I480 Paroxysmal atrial fibrillation: Secondary | ICD-10-CM

## 2013-11-12 DIAGNOSIS — I4891 Unspecified atrial fibrillation: Secondary | ICD-10-CM

## 2013-11-12 NOTE — Progress Notes (Signed)
Remote pacemaker transmission.   

## 2013-11-14 LAB — MDC_IDC_ENUM_SESS_TYPE_REMOTE
Battery Remaining Longevity: 35 mo
Battery Voltage: 2.76 V
Brady Statistic AP VS Percent: 8 %
Brady Statistic AS VP Percent: 0 %
Date Time Interrogation Session: 20150813134146
Lead Channel Impedance Value: 582 Ohm
Lead Channel Impedance Value: 902 Ohm
Lead Channel Pacing Threshold Amplitude: 0.625 V
Lead Channel Pacing Threshold Amplitude: 1.5 V
Lead Channel Setting Pacing Amplitude: 1.5 V
Lead Channel Setting Pacing Amplitude: 3 V
Lead Channel Setting Sensing Sensitivity: 2.8 mV
MDC IDC MSMT BATTERY IMPEDANCE: 1595 Ohm
MDC IDC MSMT LEADCHNL RA PACING THRESHOLD PULSEWIDTH: 0.4 ms
MDC IDC MSMT LEADCHNL RV PACING THRESHOLD PULSEWIDTH: 0.4 ms
MDC IDC SET LEADCHNL RV PACING PULSEWIDTH: 0.4 ms
MDC IDC STAT BRADY AP VP PERCENT: 92 %
MDC IDC STAT BRADY AS VS PERCENT: 0 %

## 2013-11-24 ENCOUNTER — Encounter: Payer: Self-pay | Admitting: Cardiology

## 2013-11-26 ENCOUNTER — Ambulatory Visit: Payer: Medicare Other | Admitting: Cardiology

## 2013-11-30 ENCOUNTER — Telehealth: Payer: Self-pay | Admitting: Family Medicine

## 2013-11-30 ENCOUNTER — Encounter: Payer: Self-pay | Admitting: Cardiovascular Disease

## 2013-11-30 NOTE — Telephone Encounter (Signed)
Verbal order given per Dr Tawanna Cooler

## 2013-11-30 NOTE — Telephone Encounter (Signed)
Pt was due to be dc'd this wk, however, asking for extension to see another 2 weeks due to pt's fluctuation in walking and transferring. Verbal order ok

## 2013-12-08 ENCOUNTER — Telehealth: Payer: Self-pay | Admitting: Cardiovascular Disease

## 2013-12-08 MED ORDER — METOPROLOL TARTRATE 50 MG PO TABS: 50.0000 mg | ORAL_TABLET | Freq: Every day | ORAL | Status: DC

## 2013-12-08 NOTE — Telephone Encounter (Signed)
PT need a new prescription for Metoprolol 50 mg #90. Please call to [facility]-[phone].

## 2013-12-08 NOTE — Telephone Encounter (Signed)
Rx was sent to pharmacy electronically. 

## 2013-12-15 ENCOUNTER — Telehealth: Payer: Self-pay | Admitting: Family Medicine

## 2013-12-15 NOTE — Telephone Encounter (Signed)
Dr Tawanna Cooler spoke with Donita. Xray not needed at this time.

## 2013-12-15 NOTE — Telephone Encounter (Addendum)
Donita would like to order pt chest xray due to hearing rales in chest. Donita needs verbal order

## 2013-12-15 NOTE — Telephone Encounter (Signed)
donita following up on request for xray. Transferred call to Dr Tawanna Cooler.

## 2013-12-22 ENCOUNTER — Other Ambulatory Visit: Payer: Self-pay | Admitting: *Deleted

## 2013-12-22 MED ORDER — AMIODARONE HCL 200 MG PO TABS: 200.0000 mg | ORAL_TABLET | Freq: Every day | ORAL | Status: DC

## 2014-02-18 ENCOUNTER — Encounter: Payer: Self-pay | Admitting: Cardiovascular Disease

## 2014-02-18 ENCOUNTER — Ambulatory Visit (INDEPENDENT_AMBULATORY_CARE_PROVIDER_SITE_OTHER): Payer: Medicare Other | Admitting: Cardiovascular Disease

## 2014-02-18 ENCOUNTER — Other Ambulatory Visit: Payer: Self-pay | Admitting: Cardiovascular Disease

## 2014-02-18 VITALS — BP 168/80 | HR 82 | Resp 16 | Ht 62.0 in | Wt 133.9 lb

## 2014-02-18 DIAGNOSIS — I951 Orthostatic hypotension: Secondary | ICD-10-CM

## 2014-02-18 DIAGNOSIS — R5383 Other fatigue: Secondary | ICD-10-CM

## 2014-02-18 DIAGNOSIS — Z79899 Other long term (current) drug therapy: Secondary | ICD-10-CM

## 2014-02-18 DIAGNOSIS — I2510 Atherosclerotic heart disease of native coronary artery without angina pectoris: Secondary | ICD-10-CM

## 2014-02-18 DIAGNOSIS — I480 Paroxysmal atrial fibrillation: Secondary | ICD-10-CM

## 2014-02-18 LAB — MDC_IDC_ENUM_SESS_TYPE_INCLINIC
Battery Remaining Longevity: 2
Brady Statistic AP VP Percent: 86 %
Brady Statistic AP VS Percent: 13 %
Brady Statistic AS VP Percent: 0 %
Brady Statistic AS VS Percent: 1 %
Lead Channel Impedance Value: 533 Ohm
Lead Channel Impedance Value: 847 Ohm
Lead Channel Pacing Threshold Amplitude: 1.25 V
Lead Channel Pacing Threshold Amplitude: 1.25 V
Lead Channel Pacing Threshold Pulse Width: 0.4 ms
Lead Channel Sensing Intrinsic Amplitude: 11.2 mV
Lead Channel Sensing Intrinsic Amplitude: 2 mV
Lead Channel Setting Pacing Pulse Width: 0.4 ms
MDC IDC MSMT BATTERY IMPEDANCE: 1746 Ohm
MDC IDC MSMT BATTERY VOLTAGE: 2.69 V
MDC IDC MSMT LEADCHNL RV PACING THRESHOLD PULSEWIDTH: 0.4 ms
MDC IDC SESS DTM: 20151119134710
MDC IDC SET LEADCHNL RA PACING AMPLITUDE: 2.5 V
MDC IDC SET LEADCHNL RV PACING AMPLITUDE: 2.75 V
MDC IDC SET LEADCHNL RV SENSING SENSITIVITY: 2.8 mV

## 2014-02-18 MED ORDER — TRAMADOL HCL 50 MG PO TABS: 25.0000 mg | ORAL_TABLET | Freq: Every day | ORAL | Status: DC

## 2014-02-18 NOTE — Progress Notes (Signed)
Patient ID: Marcella Dubs, female   DOB: 02-Aug-1920, 80 y.o.   MRN: [id number]      Reason for office visit Pacemaker check, syncope, PAF, CAD  Winnifred is doing well. She has not had any syncopal events since her hospitalization last July. She has not had presyncope either. Her blood pressure is running a little higher than before, purposefully so to avoid recurrent syncope from orthostatic hypotension. She takes Midodrine only once daily in the mornings, and continues taking antihypertensive medications. She is frail, essentially wheelchair-bound. She is cared for by her very attentive son  She has a history of coronary disease and received a stent to the LAD artery in 2012 (drug-eluting resolute 3.0 x 18 mm, just distal to an older 3.5 x 13 mm Promus stent placed in 2008). She has a history of complete heart block and tachycardia bradycardia syndrome and received a dual-chamber transvenous pacemaker in 2009 (Medtronic versa). She hs a history of paroxysmal atrial fibrillation, but had a spontaneous iliopsoas hematoma while on combination aspirin/clopidogrel therapy. She is now on aspirin monotherapy. She is quite debilitated and spends most of her time in a wheelchair. She has had recurrent problems with dizziness that appear to be secondary to orthostatic hypotension and takes Midodrine. She had a syncopal event in November 2014 and July 2015, not associated with significant injury.  She also has a history of gastroesophageal reflux disease, esophageal stricture requiring dilatation as well as hyperlipidemia. She has progressive dementia.  Pacemaker interrogation shows normal device function. Estimated generator longevity is about 2 years (automatic atrial lead capture was erroneous during atrial fibrillation leading to unnecessarily high pacing output results). She has 99% atrial paced rhythm, 86% ventricular pacing. She has had essentially a single episode of paroxysmal atrial fibrillation  lasting for a total of about 2 hours on November 13, the first in a very long time   Allergies  Allergen Reactions  . Codeine Other (See Comments)    Syncope   . Imdur [Isosorbide] Other (See Comments)    syncope  . Isosorbide Mononitrate Other (See Comments)    fainted  . Lorazepam Other (See Comments)    hallucinations  . Prednisone Nausea And Vomiting  . Penicillins Rash  . Valium [Diazepam] Other (See Comments)    Reaction unknown    Current Outpatient Prescriptions  Medication Sig Dispense Refill  . amiodarone (PACERONE) 200 MG tablet Take 1 tablet (200 mg total) by mouth daily. 30 tablet 1  . aspirin 81 MG EC tablet Take 81 mg by mouth daily.      . bisacodyl (DULCOLAX) 5 MG EC tablet Take 5 mg by mouth 2 (two) times daily.    [name] levothyroxine (SYNTHROID, LEVOTHROID) 100 MCG tablet Take 1 tablet (100 mcg total) by mouth daily before breakfast. 90 tablet 2  . loratadine (CLARITIN) 10 MG tablet Take 10 mg by mouth daily.      [name] losartan (COZAAR) 50 MG tablet Take 1 tablet (50 mg total) by mouth 2 (two) times daily. 180 tablet 2  . metoprolol (LOPRESSOR) 50 MG tablet Take 1 tablet (50 mg total) by mouth at bedtime. 90 tablet 3  . midodrine (PROAMATINE) 2.5 MG tablet Take 1 tablet (2.5 mg total) by mouth daily before breakfast. 30 tablet 11  . Multiple Vitamins-Minerals (PRESERVISION AREDS 2) CAPS Take 1 capsule by mouth daily.     . nitroGLYCERIN (NITROSTAT) 0.4 MG SL tablet Place 0.4 mg under the tongue every 5 (five) minutes as needed for  chest pain.     . pantoprazole (PROTONIX) 40 MG tablet Take 1 tablet (40 mg total) by mouth 2 (two) times daily. 30 tablet 0  . ranolazine (RANEXA) 500 MG 12 hr tablet Take 1 tablet (500 mg total) by mouth daily. 30 tablet 11  . simvastatin (ZOCOR) 20 MG tablet Take 1 tablet (20 mg total) by mouth at bedtime. 30 tablet 0  . traMADol (ULTRAM) 50 MG tablet Take 0.5 tablets (25 mg total) by mouth at bedtime. 90 tablet 0  . trimethoprim (TRIMPEX)  100 MG tablet Take 100 mg by mouth at bedtime.     No current facility-administered medications for this visit.    Past Medical History  Diagnosis Date  . CHF (congestive heart failure)   . A-fib   . Hypothyroid   . GERD (gastroesophageal reflux disease)   . Hypothyroidism   . Bradycardia   . Pancreatitis   . Coronary artery disease   . Shingles 1980's    "twice; once on my front side; once on my back" (02/07/2012)  . CORONARY ARTERY DISEASE 11/28/2006    Qualifier: Diagnosis of  By: Sherren Mocha MD, Jory Ee   . Pacemaker   . Paroxysmal a-fib, history of 08/02/2011  . Tremor, essential 08/02/2011  . Esophageal disorder, with history of dilatation 08/02/2011  . Angina   . H/O hiatal hernia   . Neuromuscular disorder     essential tremors  . UTI (lower urinary tract infection) 08/03/2011  . Dizziness, near syncope 11/07/2011  . Macular degeneration, dry     "both eyes" (02/07/2012)  . Hypertension   . HTN (hypertension), accelerated this admit 11/08/2011  . Pneumonia 1927  . Syncope     Past Surgical History  Procedure Laterality Date  . Repair spigelian hernia    . S/p childbirth      x2  . Cataract extraction w/ intraocular lens  implant, bilateral  ~ 1997  . Vaginal hysterectomy  1970's  . Left oophorectomy  1970's  . Insert / replace / remove pacemaker  2000; 2009    initial placement; replaced  . Appendectomy  1933  . Cholecystectomy  1950's  . Hernia repair    . Esophageal dilation      "once" (02/07/2012)  . Cardiac catheterization  12/24/2005    No intervention - recommend medical therapy  . Cardiac catheterization  10/13/2006    80% proximal LAD stenosis, stented w/ a 3.5x105m Cypher stent at 14atm (3.742m, resulting in reduction of 80% to 0% residual.  . Cardiac catheterization  11/14/2006    No intervention - recommend medical therapy  . Cardiac catheterization  07/24/2007    No intervention.  . Cardiac catheterization  01/16/2010    75% proximal LAD stenosis w/ in-stent  restenosis. No intervention. Dr BeGwenlyn Foundill intervene on proximal LAD lesion.  . Cardiac catheterization  01/16/2010    75% proximal LAD stenosis, stented w/ a 3x1277mromus DES at 16atm, resulting in reduction 75% to 0% residual  . Cardioversion  07/11/2010    Successful conversion to V paced verified via pacemaker.  . Cardiac catheterization  09/08/2010    LAD 75% in-stent restenosis, stented w/ a 3x18m70msolute DES at 16atm (3.36mm52mesulting in reduction of 75% stenosis  . Renal doppler  12/12/2010    Bilat proximal renal 1-59% diameter reduction. Bilat kidneys are normal.  . Cardiac catheterization  12/22/2010    No intervention - recommend medical therapy.  . Cardiovascular stress test  08/09/2011  No scintigraphic evidence of inducible myocardial ischemia. Non-diagnostic for ischemia.  . Transthoracic echocardiogram  01/21/2012    EF 55-60%, mild-moderate tricuspid regurg.    Family History  Problem Relation Age of Onset  . Sudden death Mother     History   Social History  . Marital Status: Widowed    Spouse Name: N/A    Number of Children: N/A  . Years of Education: N/A   Occupational History  . Not on file.   Social History Main Topics  . Smoking status: Never Smoker   . Smokeless tobacco: Never Used  . Alcohol Use: No  . Drug Use: No  . Sexual Activity: No   Other Topics Concern  . Not on file   Social History Narrative    Review of systems: No syncope since July.  The patient specifically denies any chest pain at rest or with exertion, dyspnea at rest or with exertion, orthopnea, paroxysmal nocturnal dyspnea, palpitations, focal neurological deficits, intermittent claudication, lower extremity edema, unexplained weight gain, cough, hemoptysis or wheezing.  The patient also denies abdominal pain, nausea, vomiting, dysphagia, diarrhea, constipation, polyuria, polydipsia, dysuria, hematuria, frequency, urgency, abnormal bleeding or bruising, fever, chills,  unexpected weight changes, mood swings, change in skin or hair texture, change in voice quality, auditory or visual problems, allergic reactions or rashes, new musculoskeletal complaints other than usual "aches and pains".   PHYSICAL EXAM BP 168/80 mmHg  Pulse 82  Resp 16  Ht 5' 2"  (1.575 m)  Wt 133 lb 14.4 oz (60.737 kg)  BMI 24.48 kg/m2 General: Alert, oriented x3, no distress  Head: no evidence of trauma, PERRL, EOMI, no exophtalmos or lid lag, no myxedema, no xanthelasma; normal ears, nose and oropharynx  Neck: normal jugular venous pulsations and no hepatojugular reflux; brisk carotid pulses without delay and no carotid bruits  Chest: clear to auscultation, no signs of consolidation by percussion or palpation, normal fremitus, symmetrical and full respiratory excursions, the pacemaker site looks healthy  Cardiovascular: normal position and quality of the apical impulse, regular rhythm, normal first and second heart sounds, no murmurs, rubs, S4 gallop is heard  Abdomen: no tenderness or distention, no masses by palpation, no abnormal pulsatility or arterial bruits, normal bowel sounds, no hepatosplenomegaly  Extremities: no clubbing, cyanosis or edema; 2+ radial, ulnar and brachial pulses bilaterally; 2+ right femoral, posterior tibial and dorsalis pedis pulses; 2+ left femoral, posterior tibial and dorsalis pedis pulses; no subclavian or femoral bruits  Neurological: grossly nonfocal   EKG: Atrial paced, ventricular paced  Lipid Panel     Component Value Date/Time   CHOL 150 03/02/2013 1155   TRIG 91 03/02/2013 1155   HDL 58 03/02/2013 1155   CHOLHDL 2.6 03/02/2013 1155   VLDL 18 03/02/2013 1155   LDLCALC 74 03/02/2013 1155    BMET    Component Value Date/Time   NA 143 10/21/2013 0337   K 4.3 10/21/2013 0337   CL 106 10/21/2013 0337   CO2 28 10/21/2013 0337   GLUCOSE 88 10/21/2013 0337   BUN 11 10/21/2013 0337   CREATININE 0.71 10/21/2013 0337   CREATININE 0.77  03/02/2013 1155   CALCIUM 8.8 10/21/2013 0337   GFRNONAA 73* 10/21/2013 0337   GFRAA 85* 10/21/2013 0337     ASSESSMENT AND PLAN   CORONARY ARTERY DISEASE  Last percutaneous revascularization was performed in 2012. Disease has generally been limited to the proximal LAD without other significant stenoses. Has preserved left ventricular systolic function. The most recent nuclear  stress test was performed in May of 2013 and did not show any perfusion abnormalities.   HYPERTENSION  Unfortunately she had significant problems with orthostatic hypotension. The midodrine has prevented further syncope. Target systolic blood pressure is probably in the 150-160 range.  Reminded not to take ProAmatine before lying down, not to take it at night.   Hyperlipidemia  Excellent lipid profile. The potential interaction between amiodarone and simvastatin is noted. She has been taking this combination of medicines for >3 years years without adverse events.   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 Fairly high prevalence of ventricular pacing despite lengthening the AV delay.  Atrial fibrillation Recently had her first episode of paroxysmal atrial fibrillation in the last almost 3 years. Continue amiodarone along with periodic reevaluation of thyroid and liver function tests. Not a candidate for warfarin or even clopidogrel due to spontaneous retroperitoneal hematoma. Continue aspirin monotherapy.  Orders Placed This Encounter  Procedures  . Comp Met (CMET)  . TSH  . Implantable device check  . EKG 12-Lead   Meds ordered this encounter  Medications  . bisacodyl (DULCOLAX) 5 MG EC tablet    Sig: Take 5 mg by mouth 2 (two) times daily.  . traMADol (ULTRAM) 50 MG tablet    Sig: Take 0.5 tablets (25 mg total) by mouth at bedtime.    Dispense:  90 tablet     Refill:  0    Calvin Chura  Sanda Klein, MD, [facility] [phone] office [phone] pager

## 2014-02-18 NOTE — Patient Instructions (Addendum)
Remote monitoring is used to monitor your pacemaker from home. This monitoring reduces the number of office visits required to check your device to one time per year. It allows us to keep an eye on the functioning of your device to ensure it is working properly. You are scheduled for a device check from home on 05-24-2014. You may send your transmission at any time that day. If you have a wireless device, the transmission will be sent automatically. After your physician reviews your transmission, you will receive a postcard with your next transmission date.  Your physician recommends that you schedule a follow-up appointment in: 6 months with Dr.Croitoru  Your physician recommends that you return for lab work in: CMP and TSH

## 2014-02-19 LAB — COMPREHENSIVE METABOLIC PANEL
ALBUMIN: 3.7 g/dL (ref 3.5–5.2)
ALT: 22 U/L (ref 0–35)
AST: 30 U/L (ref 0–37)
Alkaline Phosphatase: 120 U/L — ABNORMAL HIGH (ref 39–117)
BUN: 12 mg/dL (ref 6–23)
CALCIUM: 9 mg/dL (ref 8.4–10.5)
CO2: 28 mEq/L (ref 19–32)
Chloride: 102 mEq/L (ref 96–112)
Creat: 0.79 mg/dL (ref 0.50–1.10)
Glucose, Bld: 77 mg/dL (ref 70–99)
Potassium: 3.9 mEq/L (ref 3.5–5.3)
Sodium: 140 mEq/L (ref 135–145)
TOTAL PROTEIN: 6.2 g/dL (ref 6.0–8.3)
Total Bilirubin: 0.6 mg/dL (ref 0.2–1.2)

## 2014-02-19 LAB — TSH: TSH: 0.507 u[IU]/mL (ref 0.350–4.500)

## 2014-02-20 ENCOUNTER — Other Ambulatory Visit: Payer: Self-pay | Admitting: Cardiovascular Disease

## 2014-02-22 NOTE — Telephone Encounter (Signed)
Rx was sent to pharmacy electronically. 

## 2014-02-23 ENCOUNTER — Other Ambulatory Visit: Payer: Self-pay | Admitting: Cardiovascular Disease

## 2014-02-23 MED ORDER — LOSARTAN POTASSIUM 50 MG PO TABS: 50.0000 mg | ORAL_TABLET | Freq: Two times a day (BID) | ORAL | Status: DC

## 2014-02-23 NOTE — Telephone Encounter (Signed)
Would you please call in her prescription for Losartan#180 and refills today.. Please call to [phone].

## 2014-02-23 NOTE — Telephone Encounter (Signed)
Rx was sent to pharmacy electronically. 

## 2014-03-08 ENCOUNTER — Telehealth: Payer: Self-pay | Admitting: Family Medicine

## 2014-03-08 NOTE — Telephone Encounter (Signed)
Son following up to see if they should bring pt in tomorrow. He states he spoke w/ you earlier.

## 2014-03-08 NOTE — Telephone Encounter (Signed)
Appointment made

## 2014-03-09 ENCOUNTER — Encounter: Payer: Self-pay | Admitting: Family Medicine

## 2014-03-09 ENCOUNTER — Ambulatory Visit (INDEPENDENT_AMBULATORY_CARE_PROVIDER_SITE_OTHER): Payer: Medicare Other | Admitting: Family Medicine

## 2014-03-09 VITALS — BP 140/84 | Temp 98.2°F | Wt 134.0 lb

## 2014-03-09 DIAGNOSIS — I2510 Atherosclerotic heart disease of native coronary artery without angina pectoris: Secondary | ICD-10-CM

## 2014-03-09 DIAGNOSIS — L231 Allergic contact dermatitis due to adhesives: Secondary | ICD-10-CM | POA: Insufficient documentation

## 2014-03-09 DIAGNOSIS — L253 Unspecified contact dermatitis due to other chemical products: Secondary | ICD-10-CM

## 2014-03-09 NOTE — Patient Instructions (Signed)
Use small amounts of moisturizing cream and Cortaid cream OTC twice daily  Return when necessary

## 2014-03-09 NOTE — Progress Notes (Signed)
   Subjective:    Patient ID: [name] Luna, female    DOB: 04-21-1920, 80 y.o.   MRN: [id number]  HPI [name] MaplesWinifred is a 80 year old female who comes in today accompanied by her son for evaluation of a rash  They noticed some small red periodically lesions on her back on Friday over the last couple days is gone away but he still brought her in here to check concerned it might be shingles.   Review of Systems    review of systems otherwise negative Objective:   Physical Exam  Well-developed well-nourished female no acute distress vital signs stable she's afebrile examination skin shows a red excoriated lesions on her back consistent with a contact type dermatitis      Assessment & Plan:  Contact dermatitis.........[name]. moisturizing cream along with OTC Cortaid cream small amounts twice daily

## 2014-03-09 NOTE — Progress Notes (Signed)
Pre visit review using our clinic review tool, if applicable. No additional management support is needed unless otherwise documented below in the visit note. 

## 2014-03-28 ENCOUNTER — Other Ambulatory Visit: Payer: Self-pay | Admitting: Cardiovascular Disease

## 2014-04-05 ENCOUNTER — Emergency Department (HOSPITAL_COMMUNITY): Payer: Medicare Other

## 2014-04-05 ENCOUNTER — Encounter (HOSPITAL_COMMUNITY): Payer: Self-pay | Admitting: Emergency Medicine

## 2014-04-05 ENCOUNTER — Observation Stay (HOSPITAL_COMMUNITY)
Admission: EM | Admit: 2014-04-05 | Discharge: 2014-04-08 | Disposition: A | Discharge status: Home / Self Care | Payer: Medicare Other | Attending: Cardiology | Admitting: Cardiology

## 2014-04-05 DIAGNOSIS — E785 Hyperlipidemia, unspecified: Secondary | ICD-10-CM | POA: Diagnosis not present

## 2014-04-05 DIAGNOSIS — Z8679 Personal history of other diseases of the circulatory system: Secondary | ICD-10-CM

## 2014-04-05 DIAGNOSIS — B961 Klebsiella pneumoniae [K. pneumoniae] as the cause of diseases classified elsewhere: Secondary | ICD-10-CM | POA: Diagnosis not present

## 2014-04-05 DIAGNOSIS — K219 Gastro-esophageal reflux disease without esophagitis: Secondary | ICD-10-CM | POA: Insufficient documentation

## 2014-04-05 DIAGNOSIS — E039 Hypothyroidism, unspecified: Secondary | ICD-10-CM | POA: Diagnosis not present

## 2014-04-05 DIAGNOSIS — I480 Paroxysmal atrial fibrillation: Secondary | ICD-10-CM | POA: Diagnosis present

## 2014-04-05 DIAGNOSIS — I10 Essential (primary) hypertension: Secondary | ICD-10-CM | POA: Diagnosis not present

## 2014-04-05 DIAGNOSIS — R0602 Shortness of breath: Secondary | ICD-10-CM

## 2014-04-05 DIAGNOSIS — I272 Other secondary pulmonary hypertension: Secondary | ICD-10-CM | POA: Insufficient documentation

## 2014-04-05 DIAGNOSIS — I442 Atrioventricular block, complete: Secondary | ICD-10-CM | POA: Insufficient documentation

## 2014-04-05 DIAGNOSIS — I25110 Atherosclerotic heart disease of native coronary artery with unstable angina pectoris: Secondary | ICD-10-CM | POA: Diagnosis not present

## 2014-04-05 DIAGNOSIS — I200 Unstable angina: Secondary | ICD-10-CM

## 2014-04-05 DIAGNOSIS — I2510 Atherosclerotic heart disease of native coronary artery without angina pectoris: Secondary | ICD-10-CM | POA: Diagnosis present

## 2014-04-05 DIAGNOSIS — Z7901 Long term (current) use of anticoagulants: Secondary | ICD-10-CM | POA: Insufficient documentation

## 2014-04-05 DIAGNOSIS — N390 Urinary tract infection, site not specified: Secondary | ICD-10-CM | POA: Insufficient documentation

## 2014-04-05 DIAGNOSIS — S7010XA Contusion of unspecified thigh, initial encounter: Secondary | ICD-10-CM | POA: Diagnosis present

## 2014-04-05 DIAGNOSIS — I509 Heart failure, unspecified: Secondary | ICD-10-CM | POA: Diagnosis not present

## 2014-04-05 DIAGNOSIS — Z950 Presence of cardiac pacemaker: Secondary | ICD-10-CM | POA: Diagnosis not present

## 2014-04-05 DIAGNOSIS — Z7982 Long term (current) use of aspirin: Secondary | ICD-10-CM | POA: Diagnosis not present

## 2014-04-05 DIAGNOSIS — Z955 Presence of coronary angioplasty implant and graft: Secondary | ICD-10-CM | POA: Insufficient documentation

## 2014-04-05 DIAGNOSIS — Z880 Allergy status to penicillin: Secondary | ICD-10-CM | POA: Diagnosis not present

## 2014-04-05 DIAGNOSIS — R079 Chest pain, unspecified: Secondary | ICD-10-CM | POA: Diagnosis present

## 2014-04-05 DIAGNOSIS — Z5189 Encounter for other specified aftercare: Secondary | ICD-10-CM | POA: Insufficient documentation

## 2014-04-05 DIAGNOSIS — R52 Pain, unspecified: Secondary | ICD-10-CM

## 2014-04-05 HISTORY — DX: Atrioventricular block, complete: I44.2

## 2014-04-05 HISTORY — DX: Other amnesia: R41.3

## 2014-04-05 HISTORY — DX: Orthostatic hypotension: I95.1

## 2014-04-05 HISTORY — DX: Hyperlipidemia, unspecified: E78.5

## 2014-04-05 HISTORY — DX: Paroxysmal atrial fibrillation: I48.0

## 2014-04-05 HISTORY — DX: Contusion of unspecified thigh, initial encounter: S70.10XA

## 2014-04-05 HISTORY — DX: Personal history of other diseases of the digestive system: Z87.19

## 2014-04-05 HISTORY — DX: Personal history of other diseases of the circulatory system: Z86.79

## 2014-04-05 HISTORY — DX: Other specified postprocedural states: Z98.890

## 2014-04-05 LAB — HEPATIC FUNCTION PANEL
ALBUMIN: 3.4 g/dL — AB (ref 3.5–5.2)
ALT: 27 U/L (ref 0–35)
AST: 36 U/L (ref 0–37)
Alkaline Phosphatase: 122 U/L — ABNORMAL HIGH (ref 39–117)
Bilirubin, Direct: 0.2 mg/dL (ref 0.0–0.3)
Indirect Bilirubin: 0.4 mg/dL (ref 0.3–0.9)
Total Bilirubin: 0.6 mg/dL (ref 0.3–1.2)
Total Protein: 6.1 g/dL (ref 6.0–8.3)

## 2014-04-05 LAB — BASIC METABOLIC PANEL
ANION GAP: 6 (ref 5–15)
BUN: 8 mg/dL (ref 6–23)
CALCIUM: 8.6 mg/dL (ref 8.4–10.5)
CO2: 28 mmol/L (ref 19–32)
CREATININE: 0.77 mg/dL (ref 0.50–1.10)
Chloride: 106 mEq/L (ref 96–112)
GFR calc Af Amer: 83 mL/min — ABNORMAL LOW (ref >90)
GFR, EST NON AFRICAN AMERICAN: 71 mL/min — AB (ref >90)
Glucose, Bld: 97 mg/dL (ref 70–99)
Potassium: 3.8 mmol/L (ref 3.5–5.1)
Sodium: 140 mmol/L (ref 135–145)

## 2014-04-05 LAB — CBC
HEMATOCRIT: 35.4 % — AB (ref 36.0–46.0)
HEMOGLOBIN: 11.3 g/dL — AB (ref 12.0–15.0)
MCH: 28.5 pg (ref 26.0–34.0)
MCHC: 31.9 g/dL (ref 30.0–36.0)
MCV: 89.2 fL (ref 78.0–100.0)
Platelets: 167 10*3/uL (ref 150–400)
RBC: 3.97 MIL/uL (ref 3.87–5.11)
RDW: 15.4 % (ref 11.5–15.5)
WBC: 5.2 10*3/uL (ref 4.0–10.5)

## 2014-04-05 LAB — PROTIME-INR
INR: 0.88 (ref 0.00–1.49)
Prothrombin Time: 12 seconds (ref 11.6–15.2)

## 2014-04-05 LAB — MAGNESIUM: MAGNESIUM: 2 mg/dL (ref 1.5–2.5)

## 2014-04-05 LAB — TSH: TSH: 0.684 u[IU]/mL (ref 0.350–4.500)

## 2014-04-05 LAB — I-STAT TROPONIN, ED: Troponin i, poc: 0.01 ng/mL (ref 0.00–0.08)

## 2014-04-05 LAB — MRSA PCR SCREENING: MRSA by PCR: NEGATIVE

## 2014-04-05 LAB — TROPONIN I
Troponin I: <0.03 ng/mL (ref <0.031)
Troponin I: <0.03 ng/mL (ref <0.031)

## 2014-04-05 MED ORDER — LEVOTHYROXINE SODIUM 100 MCG PO TABS
100.0000 ug | ORAL_TABLET | Freq: Every day | ORAL | Status: DC
  Administered 2014-04-05 – 2014-04-08 (×4): 100 ug via ORAL
  Filled 2014-04-05 (×4): qty 1

## 2014-04-05 MED ORDER — AMIODARONE HCL 200 MG PO TABS
200.0000 mg | ORAL_TABLET | Freq: Every day | ORAL | Status: DC
  Administered 2014-04-05 – 2014-04-08 (×4): 200 mg via ORAL
  Filled 2014-04-05 (×4): qty 1

## 2014-04-05 MED ORDER — NITROGLYCERIN IN D5W 200-5 MCG/ML-% IV SOLN
2.0000 ug/min | INTRAVENOUS | Status: DC
  Administered 2014-04-05: 10 ug/min via INTRAVENOUS
  Filled 2014-04-05: qty 250

## 2014-04-05 MED ORDER — TRIMETHOPRIM 100 MG PO TABS
100.0000 mg | ORAL_TABLET | Freq: Every day | ORAL | Status: DC
  Administered 2014-04-05 – 2014-04-07 (×3): 100 mg via ORAL
  Filled 2014-04-05 (×4): qty 1

## 2014-04-05 MED ORDER — PANTOPRAZOLE SODIUM 40 MG PO TBEC
40.0000 mg | DELAYED_RELEASE_TABLET | Freq: Two times a day (BID) | ORAL | Status: DC
  Administered 2014-04-05 – 2014-04-08 (×7): 40 mg via ORAL
  Filled 2014-04-05 (×7): qty 1

## 2014-04-05 MED ORDER — ASPIRIN 81 MG PO TBEC: 81.0000 mg | DELAYED_RELEASE_TABLET | Freq: Every day | ORAL | Status: DC

## 2014-04-05 MED ORDER — BISACODYL 5 MG PO TBEC
5.0000 mg | DELAYED_RELEASE_TABLET | Freq: Two times a day (BID) | ORAL | Status: DC
  Administered 2014-04-05 – 2014-04-07 (×4): 5 mg via ORAL
  Filled 2014-04-05 (×7): qty 1

## 2014-04-05 MED ORDER — SODIUM CHLORIDE 0.9 % IV SOLN
1.0000 mL/kg/h | INTRAVENOUS | Status: DC
  Administered 2014-04-06: 1 mL/kg/h via INTRAVENOUS

## 2014-04-05 MED ORDER — ACETAMINOPHEN 325 MG PO TABS
650.0000 mg | ORAL_TABLET | ORAL | Status: DC | PRN
  Administered 2014-04-05 – 2014-04-08 (×5): 650 mg via ORAL
  Filled 2014-04-05 (×5): qty 2

## 2014-04-05 MED ORDER — CHLORHEXIDINE GLUCONATE CLOTH 2 % EX PADS: 6.0000 | MEDICATED_PAD | Freq: Every day | CUTANEOUS | Status: DC

## 2014-04-05 MED ORDER — CHLORHEXIDINE GLUCONATE CLOTH 2 % EX PADS
6.0000 | MEDICATED_PAD | Freq: Every day | CUTANEOUS | Status: AC
  Administered 2014-04-05: 6 via TOPICAL

## 2014-04-05 MED ORDER — MIDODRINE HCL 5 MG PO TABS
2.5000 mg | ORAL_TABLET | Freq: Every day | ORAL | Status: DC
  Administered 2014-04-06: 2.5 mg via ORAL
  Filled 2014-04-05: qty 1

## 2014-04-05 MED ORDER — ASPIRIN 81 MG PO CHEW
81.0000 mg | CHEWABLE_TABLET | ORAL | Status: AC
  Administered 2014-04-06: 81 mg via ORAL
  Filled 2014-04-05: qty 1

## 2014-04-05 MED ORDER — METOPROLOL TARTRATE 25 MG PO TABS
25.0000 mg | ORAL_TABLET | Freq: Two times a day (BID) | ORAL | Status: DC
  Administered 2014-04-05 – 2014-04-08 (×6): 25 mg via ORAL
  Filled 2014-04-05 (×7): qty 1

## 2014-04-05 MED ORDER — TRAMADOL HCL 50 MG PO TABS
25.0000 mg | ORAL_TABLET | Freq: Every day | ORAL | Status: DC
  Administered 2014-04-05 – 2014-04-07 (×3): 25 mg via ORAL
  Filled 2014-04-05 (×3): qty 1

## 2014-04-05 MED ORDER — ASPIRIN EC 81 MG PO TBEC
81.0000 mg | DELAYED_RELEASE_TABLET | Freq: Every day | ORAL | Status: DC
  Administered 2014-04-07 – 2014-04-08 (×2): 81 mg via ORAL
  Filled 2014-04-05 (×2): qty 1

## 2014-04-05 MED ORDER — MORPHINE SULFATE 2 MG/ML IJ SOLN
2.0000 mg | INTRAMUSCULAR | Status: DC | PRN
  Filled 2014-04-05: qty 1

## 2014-04-05 MED ORDER — RANOLAZINE ER 500 MG PO TB12
500.0000 mg | ORAL_TABLET | Freq: Every day | ORAL | Status: DC
  Administered 2014-04-05 – 2014-04-08 (×4): 500 mg via ORAL
  Filled 2014-04-05 (×4): qty 1

## 2014-04-05 MED ORDER — LORATADINE 10 MG PO TABS
10.0000 mg | ORAL_TABLET | Freq: Every day | ORAL | Status: DC
  Administered 2014-04-05 – 2014-04-08 (×4): 10 mg via ORAL
  Filled 2014-04-05 (×4): qty 1

## 2014-04-05 MED ORDER — MORPHINE SULFATE 2 MG/ML IJ SOLN
2.0000 mg | Freq: Once | INTRAMUSCULAR | Status: AC
  Administered 2014-04-05: 2 mg via INTRAVENOUS
  Filled 2014-04-05: qty 1

## 2014-04-05 MED ORDER — NITROGLYCERIN 0.4 MG SL SUBL
0.4000 mg | SUBLINGUAL_TABLET | SUBLINGUAL | Status: DC | PRN
  Administered 2014-04-05 – 2014-04-08 (×8): 0.4 mg via SUBLINGUAL
  Filled 2014-04-05 (×3): qty 1

## 2014-04-05 MED ORDER — SIMVASTATIN 20 MG PO TABS
20.0000 mg | ORAL_TABLET | Freq: Every day | ORAL | Status: DC
  Administered 2014-04-05 – 2014-04-07 (×3): 20 mg via ORAL
  Filled 2014-04-05 (×3): qty 1

## 2014-04-05 MED ORDER — ONDANSETRON HCL 4 MG/2ML IJ SOLN
4.0000 mg | Freq: Four times a day (QID) | INTRAMUSCULAR | Status: DC | PRN
  Administered 2014-04-07 – 2014-04-08 (×2): 4 mg via INTRAVENOUS
  Filled 2014-04-05 (×2): qty 2

## 2014-04-05 NOTE — Progress Notes (Signed)
Per d/w Dr. Patty Sermons, will change Lopressor  nightly to  BID. OK for full liquid diet for now. If she has cooled off this evening we can consider regular heart healthy diet then NPO after midnight as usual.  Cath scheduled with Dr. [place] tomorrow (cath board full today). Risks and benefits of cardiac catheterization have been discussed with the patient. These include bleeding, infection, kidney damage, stroke, heart attack, death.  The patient understands these risks and is willing to proceed. For documentation purposes, she received  ASA this AM. CHADSVASC = at least 5 for HTN, age, female, vascular disease. Her chart carries hx of CHF but unclear documentation. Dayna Dunn PA-C

## 2014-04-05 NOTE — ED Notes (Signed)
Fall bracelet and allergy bracelet applied to pt,

## 2014-04-05 NOTE — ED Notes (Signed)
Pt's son at bedside

## 2014-04-05 NOTE — ED Notes (Signed)
Lab at bedside

## 2014-04-05 NOTE — ED Notes (Signed)
Placed on  bedpan

## 2014-04-05 NOTE — ED Notes (Signed)
Will notify radiology when pt is able to go to x-ray

## 2014-04-05 NOTE — ED Notes (Signed)
Pt returned from xray

## 2014-04-05 NOTE — ED Provider Notes (Signed)
CSN: [id number]     Arrival date & time 04/05/14  0753 History   First MD Initiated Contact with Patient 04/05/14 0757     Chief Complaint  Patient presents with  . Chest Pain     (Consider location/radiation/quality/duration/timing/severity/associated sxs/prior Treatment) Patient is a 81 y.o. female presenting with chest pain.  Chest Pain Pain location:  L chest Pain quality: sharp   Pain radiates to:  Does not radiate Pain severity:  Severe Onset quality:  Sudden Duration: a few hours. Timing:  Constant Progression:  Partially resolved Chronicity:  New Context comment:  Noticed pain when she got up from bed this morning to use bathroom Relieved by:  Aspirin and nitroglycerin Exacerbated by: getting out of bed. Associated symptoms: diaphoresis, dizziness and shortness of breath   Associated symptoms: no fever, no nausea and not vomiting   Associated symptoms comment:  Bilateral throbbing leg pain   Past Medical History  Diagnosis Date  . CHF (congestive heart failure)   . A-fib   . Hypothyroid   . GERD (gastroesophageal reflux disease)   . Hypothyroidism   . Bradycardia   . Pancreatitis   . Coronary artery disease   . Shingles 1980's    "twice; once on my front side; once on my back" (02/07/2012)  . CORONARY ARTERY DISEASE 11/28/2006    Qualifier: Diagnosis of  By: Tawanna Cooler MD, Eugenio Hoes   . Pacemaker   . Paroxysmal a-fib, history of 08/02/2011  . Tremor, essential 08/02/2011  . Esophageal disorder, with history of dilatation 08/02/2011  . Angina   . H/O hiatal hernia   . Neuromuscular disorder     essential tremors  . UTI (lower urinary tract infection) 08/03/2011  . Dizziness, near syncope 11/07/2011  . Macular degeneration, dry     "both eyes" (02/07/2012)  . Hypertension   . HTN (hypertension), accelerated this admit 11/08/2011  . Pneumonia 1927  . Syncope    Past Surgical History  Procedure Laterality Date  . Repair spigelian hernia    . S/p childbirth      x2  .  Cataract extraction w/ intraocular lens  implant, bilateral  ~ 1997  . Vaginal hysterectomy  1970's  . Left oophorectomy  1970's  . Insert / replace / remove pacemaker  2000; 2009    initial placement; replaced  . Appendectomy  1933  . Cholecystectomy  1950's  . Hernia repair    . Esophageal dilation      "once" (02/07/2012)  . Cardiac catheterization  12/24/2005    No intervention - recommend medical therapy  . Cardiac catheterization  10/13/2006    80% proximal LAD stenosis, stented w/ a 3.5x32mm Cypher stent at 14atm (3.33mm), resulting in reduction of 80% to 0% residual.  . Cardiac catheterization  11/14/2006    No intervention - recommend medical therapy  . Cardiac catheterization  07/24/2007    No intervention.  . Cardiac catheterization  01/16/2010    75% proximal LAD stenosis w/ in-stent restenosis. No intervention. Dr Allyson Sabal will intervene on proximal LAD lesion.  . Cardiac catheterization  01/16/2010    75% proximal LAD stenosis, stented w/ a 3x8mm Promus DES at 16atm, resulting in reduction 75% to 0% residual  . Cardioversion  07/11/2010    Successful conversion to V paced verified via pacemaker.  . Cardiac catheterization  09/08/2010    LAD 75% in-stent restenosis, stented w/ a 3x75mm Resolute DES at 16atm (3.2mm), resulting in reduction of 75% stenosis  . Renal doppler  12/12/2010    Bilat proximal renal 1-59% diameter reduction. Bilat kidneys are normal.  . Cardiac catheterization  12/22/2010    No intervention - recommend medical therapy.  . Cardiovascular stress test  08/09/2011    No scintigraphic evidence of inducible myocardial ischemia. Non-diagnostic for ischemia.  . Transthoracic echocardiogram  01/21/2012    EF 55-60%, mild-moderate tricuspid regurg.   Family History  Problem Relation Age of Onset  . Sudden death Mother    History  Substance Use Topics  . Smoking status: Never Smoker   . Smokeless tobacco: Never Used  . Alcohol Use: No   OB History    No data  available     Review of Systems  Constitutional: Positive for diaphoresis. Negative for fever.  Respiratory: Positive for shortness of breath.   Cardiovascular: Positive for chest pain.  Gastrointestinal: Negative for nausea and vomiting.  Neurological: Positive for dizziness.  All other systems reviewed and are negative.     Allergies  Codeine; Imdur; Isosorbide mononitrate; Lorazepam; Prednisone; Penicillins; and Valium  Home Medications   Prior to Admission medications   Medication Sig Start Date End Date Taking? Authorizing Provider  amiodarone (PACERONE) 200 MG tablet TAKE ONE TABLET BY MOUTH ONCE DAILY 02/22/14   Runell Gess, MD  aspirin 81 MG EC tablet Take 81 mg by mouth daily.      Historical Provider, MD  bisacodyl (DULCOLAX) 5 MG EC tablet Take 5 mg by mouth 2 (two) times daily.    Historical Provider, MD  levothyroxine (SYNTHROID, LEVOTHROID) 100 MCG tablet TAKE ONE TABLET BY MOUTH ONCE DAILY BEFORE BREAKFAST 03/29/14   Mihai Croitoru, MD  loratadine (CLARITIN) 10 MG tablet Take 10 mg by mouth daily.      Historical Provider, MD  losartan (COZAAR) 50 MG tablet Take 1 tablet (50 mg total) by mouth 2 (two) times daily. 02/23/14   Mihai Croitoru, MD  metoprolol (LOPRESSOR) 50 MG tablet Take 1 tablet (50 mg total) by mouth at bedtime. 12/08/13   Mihai Croitoru, MD  midodrine (PROAMATINE) 2.5 MG tablet Take 1 tablet (2.5 mg total) by mouth daily before breakfast. 08/13/13   Mihai Croitoru, MD  Multiple Vitamins-Minerals (PRESERVISION AREDS 2) CAPS Take 1 capsule by mouth daily.     Historical Provider, MD  nitroGLYCERIN (NITROSTAT) 0.4 MG SL tablet Place 0.4 mg under the tongue every 5 (five) minutes as needed for chest pain.     Historical Provider, MD  pantoprazole (PROTONIX) 40 MG tablet Take 1 tablet (40 mg total) by mouth 2 (two) times daily. 10/21/13   Elease Etienne, MD  ranolazine (RANEXA) 500 MG 12 hr tablet Take 1 tablet (500 mg total) by mouth daily. 08/13/13    Mihai Croitoru, MD  simvastatin (ZOCOR) 20 MG tablet Take 1 tablet (20 mg total) by mouth at bedtime. 10/21/13   Elease Etienne, MD  traMADol (ULTRAM) 50 MG tablet Take 0.5 tablets (25 mg total) by mouth at bedtime. 02/18/14   Mihai Croitoru, MD  trimethoprim (TRIMPEX) 100 MG tablet Take 100 mg by mouth at bedtime.    Historical Provider, MD   BP 140/70 mmHg  Pulse 70  Temp(Src) 97.6 F (36.4 C) (Oral)  Resp 16  Ht  (1.626 m)  Wt 137 lb (62.143 kg)  BMI 23.50 kg/m2  SpO2 95% Physical Exam  Constitutional: She is oriented to person, place, and time. She appears well-developed and well-nourished. No distress.  Appears uncomfortable  HENT:  Head: Normocephalic and atraumatic.  Mouth/Throat: Oropharynx is clear and moist.  Eyes: Conjunctivae are normal. Pupils are equal, round, and reactive to light. No scleral icterus.  Neck: Neck supple.  Cardiovascular: Normal rate, regular rhythm, normal heart sounds and intact distal pulses.   No murmur heard. Pulmonary/Chest: Effort normal and breath sounds normal. No stridor. No respiratory distress. She has no rales.  Abdominal: Soft. Bowel sounds are normal. She exhibits no distension. There is no tenderness. There is no rebound and no guarding.  Musculoskeletal: Normal range of motion. She exhibits no edema.  Neurological: She is alert and oriented to person, place, and time.  Skin: Skin is warm and dry. No rash noted. She is not diaphoretic.  Psychiatric: She has a normal mood and affect. Her behavior is normal.  Nursing note and vitals reviewed.   ED Course  Procedures (including critical care time) Labs Review Labs Reviewed  BASIC METABOLIC PANEL - Abnormal; Notable for the following:    GFR calc non Af Amer 71 (*)    GFR calc Af Amer 83 (*)    All other components within normal limits  CBC - Abnormal; Notable for the following:    Hemoglobin 11.3 (*)    HCT 35.4 (*)    All other components within normal limits  I-STAT  TROPOININ, ED    Imaging Review Dg Chest 2 View  04/05/2014   CLINICAL DATA:  Chest pain and shortness of breath today.  EXAM: CHEST  2 VIEW  COMPARISON:  10/20/2013  FINDINGS: Right AICD leads overlie the right atrium and right ventricle. The heart is enlarged. There is minimal right basilar atelectasis. No focal consolidations or pleural effusions. No pulmonary edema. Left upper quadrant calcification likely represents a splenic artery aneurysm and appears stable over multiple prior studies measuring less than 2 cm in diameter.  IMPRESSION: 1. Cardiomegaly. 2.  No evidence for acute cardiopulmonary abnormality.   Electronically Signed   By: Rosalie Gums M.D.   On: 04/05/2014 09:32  All radiology studies independently viewed by me.      EKG Interpretation   Date/Time:  Monday April 05 2014 07:56:58 EST Ventricular Rate:  79 PR Interval:  336 QRS Duration: 94 QT Interval:  392 QTC Calculation: 449 R Axis:   -32 Text Interpretation:  ATRIAL PACED RHYTHM Prolonged PR interval Left axis  deviation Abnormal R-wave progression, late transition Borderline  repolarization abnormality compared to most recent, now atrial paced with  nonspecific t wave changes Confirmed by [facility]  MD, TREY ([id number]) on  04/05/2014 8:44:42 AM      MDM   Final diagnoses:  Chest pain, unspecified chest pain type  Shortness of breath    81 yo female with a history of coronary artery disease who presents with chest pain and shortness of breath. Her EKG is atrial paced but shows some nonspecific T-wave changes. Initial troponin is negative. Plan to admit to cardiology.    Candyce Churn III, MD 04/05/14 [phone]

## 2014-04-05 NOTE — H&P (Signed)
History and Physical  Patient ID: [name] Luna MRN: [id number], DOB: 06-Jul-1920 Date of Encounter: 04/05/2014, 10:54 AM Primary Physician: Evette Georges, MD Primary Cardiologist: Croitoru  Chief Complaint: chest pain Reason for Admission: chest pain  HPI: [name] Luna is a 81 y/o F with history of CAD (DES to LAD 2008, 2011, 2012 without significant disease elsewhere, normal nuc 08/2011 with EF 80%), PAF (not a candidate for anticoag other than aspirin due to h/o spontaneous iliopsoas hematoma 05/2013 while on ASA/Plavix), GERD, esophageal stricture s/p dilitation, CHB s/p MDT pacemaker 2009, essential tremor, macular degeneration, and syncope/orthostatic hypotension (BPs allowed to run higher due to this). Dr. Renaye Rakers last note alludes to progressive dementia but the patient's son states she does pretty well - he cares for her. She is able to answer my questions appropriately and provide a good history. She spends most of her time in a wheelchair. She presented to [facility] today with chest pain.   She has done well from a cardiac standpoint recently without any CP or syncope. However, today she awoke around 3am with substernal chest pain across her lower chest. She felt SOB. Symptoms persisted around 3/10 and finally she told her son around 6am. She was brought to the ED. She received 3 SL NTG which eased her pain. She had recurrence around 9:45am and was treated with IV morphine. However, she now states the pain is coming back at a 5/10 with dyspnea, nausea, and sensation of feeling sweaty. It is slightly worse with inspiration, not worse with slight movement or palpation. It is possibly similar to prior angina (she's not totally sure). She denies recent bleeding, syncope, LEE, orthopnea, fever. She does endorse feeling weak all over. VSS except blood pressure running moderately elevated in the ED (keeping in mind her BP runs higher to avoid significant orthostatic drops). Troponin negative x1, CBC/BMET  unremarkable except Hgb mildly decreased consistent with prior. CXR - cardiomegaly without acute disease. EKG showing inferolateral TW changes different than 05/2013 (the last non-paced EKG in EPIC).  Past Medical History  Diagnosis Date  . CHF (congestive heart failure)   . GERD (gastroesophageal reflux disease)   . Hypothyroidism   . Complete heart block     a. Bradycardia/CHB s/p Medtronic pacemaker 2009.  [name] Pancreatitis   . Coronary artery disease     a. DES to LAD 2008, 2011, 2012 (no disease in LM or RCA, mild luminal irreg Cx). b. Normal nuc 08/2011: no ischemia, EF 80%.  . Shingles 1980's    "twice; once on my front side; once on my back" (02/07/2012)  . Pacemaker   . PAF (paroxysmal atrial fibrillation)   . Tremor, essential   . S/P dilatation of esophageal stricture   . H/O hiatal hernia   . Neuromuscular disorder     essential tremors  . UTI (lower urinary tract infection) 08/03/2011  . Dizziness, near syncope   . Macular degeneration, dry     "both eyes" (02/07/2012)  . Hypertension   . Pneumonia 1927  . Syncope   . Orthostatic hypotension   . Iliopsoas muscle hematoma     a. Spontaneously in the past while on aspirin/Plavix. Not a candidate for anticoagulation other than aspirin due to this.  . Hyperlipidemia   . Memory loss      Most Recent Cardiac Studies: Cardiac cath from 2012 - see Epic. No longer able to copy/paste from report.  2D Echo 01/2012 -  - Left ventricle: The cavity size was normal.  Wall thickness was normal. Systolic function was normal. The estimated ejection fraction was in the range of 55% to 60%. Wall motion was normal; there were no regional wall motion abnormalities. Features are consistent with a pseudonormal left ventricular filling pattern, with concomitant abnormal relaxation and increased filling pressure (grade 2 diastolic dysfunction). Doppler parameters are consistent with elevated mean left atrial  filling pressure. - Aortic valve: Mild regurgitation. - Mitral valve: Calcified annulus. Mild regurgitation. - Right atrium: The atrium was mildly dilated. - Atrial septum: The septum bowed from left to right, consistent with increased left atrial pressure. No defect or patent foramen ovale was identified. - Tricuspid valve: Mild-moderate regurgitation. - Pulmonary arteries: Systolic pressure was mildly increased. PA peak pressure: 36mm Hg (S).   Surgical History:  Past Surgical History  Procedure Laterality Date  . Repair spigelian hernia    . S/p childbirth      x2  . Cataract extraction w/ intraocular lens  implant, bilateral  ~ 1997  . Vaginal hysterectomy  1970's  . Left oophorectomy  1970's  . Insert / replace / remove pacemaker  2000; 2009    initial placement; replaced  . Appendectomy  1933  . Cholecystectomy  1950's  . Hernia repair    . Esophageal dilation      "once" (02/07/2012)  . Cardiac catheterization  12/24/2005    No intervention - recommend medical therapy  . Cardiac catheterization  10/13/2006    80% proximal LAD stenosis, stented w/ a 3.5x39mm Cypher stent at 14atm (3.4mm), resulting in reduction of 80% to 0% residual.  . Cardiac catheterization  11/14/2006    No intervention - recommend medical therapy  . Cardiac catheterization  07/24/2007    No intervention.  . Cardiac catheterization  01/16/2010    75% proximal LAD stenosis w/ in-stent restenosis. No intervention. Dr Allyson Sabal will intervene on proximal LAD lesion.  . Cardiac catheterization  01/16/2010    75% proximal LAD stenosis, stented w/ a 3x38mm Promus DES at 16atm, resulting in reduction 75% to 0% residual  . Cardioversion  07/11/2010    Successful conversion to V paced verified via pacemaker.  . Cardiac catheterization  09/08/2010    LAD 75% in-stent restenosis, stented w/ a 3x36mm Resolute DES at 16atm (3.65mm), resulting in reduction of 75% stenosis  . Renal doppler  12/12/2010    Bilat  proximal renal 1-59% diameter reduction. Bilat kidneys are normal.  . Cardiac catheterization  12/22/2010    No intervention - recommend medical therapy.  . Cardiovascular stress test  08/09/2011    No scintigraphic evidence of inducible myocardial ischemia. Non-diagnostic for ischemia.  . Transthoracic echocardiogram  01/21/2012    EF 55-60%, mild-moderate tricuspid regurg.     Home Meds: Prior to Admission medications   Medication Sig Start Date End Date Taking? Authorizing Provider  amiodarone (PACERONE) 200 MG tablet TAKE ONE TABLET BY MOUTH ONCE DAILY 02/22/14   Runell Gess, MD  aspirin 81 MG EC tablet Take 81 mg by mouth daily.      Historical Provider, MD  bisacodyl (DULCOLAX) 5 MG EC tablet Take 5 mg by mouth 2 (two) times daily.    Historical Provider, MD  levothyroxine (SYNTHROID, LEVOTHROID) 100 MCG tablet TAKE ONE TABLET BY MOUTH ONCE DAILY BEFORE BREAKFAST 03/29/14   Mihai Croitoru, MD  loratadine (CLARITIN) 10 MG tablet Take 10 mg by mouth daily.      Historical Provider, MD  losartan (COZAAR) 50 MG tablet Take 1 tablet (50 mg  total) by mouth 2 (two) times daily. 02/23/14   Mihai Croitoru, MD  metoprolol (LOPRESSOR) 50 MG tablet Take 1 tablet (50 mg total) by mouth at bedtime. 12/08/13   Mihai Croitoru, MD  midodrine (PROAMATINE) 2.5 MG tablet Take 1 tablet (2.5 mg total) by mouth daily before breakfast. 08/13/13   Mihai Croitoru, MD  Multiple Vitamins-Minerals (PRESERVISION AREDS 2) CAPS Take 1 capsule by mouth daily.     Historical Provider, MD  nitroGLYCERIN (NITROSTAT) 0.4 MG SL tablet Place 0.4 mg under the tongue every 5 (five) minutes as needed for chest pain.     Historical Provider, MD  pantoprazole (PROTONIX) 40 MG tablet Take 1 tablet (40 mg total) by mouth 2 (two) times daily. 10/21/13   Elease Etienne, MD  ranolazine (RANEXA) 500 MG 12 hr tablet Take 1 tablet (500 mg total) by mouth daily. 08/13/13   Mihai Croitoru, MD  simvastatin (ZOCOR) 20 MG tablet Take 1 tablet  (20 mg total) by mouth at bedtime. 10/21/13   Elease Etienne, MD  traMADol (ULTRAM) 50 MG tablet Take 0.5 tablets (25 mg total) by mouth at bedtime. 02/18/14   Mihai Croitoru, MD  trimethoprim (TRIMPEX) 100 MG tablet Take 100 mg by mouth at bedtime.    Historical Provider, MD    Allergies:  Allergies  Allergen Reactions  . Codeine Other (See Comments)    Syncope   . Imdur [Isosorbide] Other (See Comments)    syncope  . Isosorbide Mononitrate Other (See Comments)    fainted  . Lorazepam Other (See Comments)    hallucinations  . Prednisone Nausea And Vomiting  . Penicillins Rash  . Valium [Diazepam] Other (See Comments)    Reaction unknown    History   Social History  . Marital Status: Widowed    Spouse Name: N/A    Number of Children: N/A  . Years of Education: N/A   Occupational History  . Not on file.   Social History Main Topics  . Smoking status: Never Smoker   . Smokeless tobacco: Never Used  . Alcohol Use: No  . Drug Use: No  . Sexual Activity: No   Other Topics Concern  . Not on file   Social History Narrative     Family History  Problem Relation Age of Onset  . Sudden death Mother     Review of Systems: General: negative for chills, fever Cardiovascular: negative for  edema, orthopnea, palpitations Respiratory: negative for cough Urologic: negative for hematuria Abdominal: negative for bright red blood per rectum, melena, or hematemesis Neurologic: negative for syncope All other systems reviewed and are otherwise negative except as noted above.  Labs:   Lab Results  Component Value Date   WBC 5.2 04/05/2014   HGB 11.3* 04/05/2014   HCT 35.4* 04/05/2014   MCV 89.2 04/05/2014   PLT 167 04/05/2014    Recent Labs Lab 04/05/14 0840  NA 140  K 3.8  CL 106  CO2 28  BUN 8  CREATININE 0.77  CALCIUM 8.6  GLUCOSE 97    Lab Results  Component Value Date   CHOL 150 03/02/2013   HDL 58 03/02/2013   LDLCALC 74 03/02/2013   TRIG 91  03/02/2013   Radiology/Studies:  Dg Chest 2 View  04/05/2014   CLINICAL DATA:  Chest pain and shortness of breath today.  EXAM: CHEST  2 VIEW  COMPARISON:  10/20/2013  FINDINGS: Right AICD leads overlie the right atrium and right ventricle. The heart is enlarged.  There is minimal right basilar atelectasis. No focal consolidations or pleural effusions. No pulmonary edema. Left upper quadrant calcification likely represents a splenic artery aneurysm and appears stable over multiple prior studies measuring less than 2 cm in diameter.  IMPRESSION: 1. Cardiomegaly. 2.  No evidence for acute cardiopulmonary abnormality.   Electronically Signed   By: Rosalie Gums M.D.   On: 04/05/2014 09:32    EKG: multiple tracings reviewed - atrial paced/NSR with TWI II, III, avF, V4-V6 - most recent QTc  Physical Exam: Blood pressure 150/66, pulse 69, temperature 97.6 F (36.4 C), temperature source Oral, resp. rate 17, height 5\' 4"  (1.626 m), weight 137 lb (62.143 kg), SpO2 97 %. General: Well developed elderly WF in no acute distress. Head: Normocephalic, atraumatic, sclera non-icteric, no xanthomas, nares are without discharge.  Neck: JVD not elevated. Lungs: Clear bilaterally to auscultation without wheezes, rales, or rhonchi. Breathing is unlabored. Heart: RRR with S1 S2. Soft SEM LLSB. No rubs or gallops appreciated. Abdomen: Soft, non-tender, non-distended with normoactive bowel sounds. No hepatomegaly. No rebound/guarding. No obvious abdominal masses. Msk:  Strength and tone appear normal for age. Extremities: No clubbing or cyanosis. No edema.  Distal pedal pulses are 2+ and equal bilaterally. Neuro: Alert and oriented X 3. No focal deficit. No facial asymmetry. Moves all extremities spontaneously. Essential tremor. Psych:  Responds to questions appropriately with a normal affect.    ASSESSMENT AND PLAN:   1. Chest pain concerning for [place] with history of CAD s/p PCIs to LAD - chest pain and EKG  changes are concerning for ischemia. Her med regimen may be tricky to advance due to significant history of orthostatic hypotension. She has h/o syncope on Imdur. However, she had relief of CP with SL NTG in the ED. Will start IV NTG gtt and follow BP closely. Continue aspirin, BB, Ranexa. If enzymes turn positive we would plan heparin without a bolus given h/o iliopsoas bleed. In light of the fact that medical therapy only may prove difficult, Dr. Patty Sermons recommends cath with the recommendation to only perform angioplasty or BMS if PCI is required given her history of spontaneous iliopsoas bleed in the past.  2. H/o spontaneous iliopsoas bleed 05/2013 while on aspirin/Plavix - see above. 3. PAF - maintaining NSR on amiodarone. Not a candidate for Coumadin given #2. 4. HTN with history of orthostatic hypotension, allowed to run higher - hold ARB for now to allow room to titrate anti-anginals. Continue midodrine. 5. Hyperlipidemia - continue statin and check lipids. 6. CHB s/p MDT pacemaker 2009 - atrial paced.  Signed, Ronie Spies PA-C 04/05/2014, 10:54 AM  Patient seen.  EKGs reviewed. She has known prior CAE with PCIs to LAD. Recently has been feeling more tired. Awoke last night with leftsided chest pain and left  Arm radiation worrisome for ischemic heart pain. EKGs reviewed personally and show widespread inferolateral T wave inversions new since prior nonpaced EKGs. Exam unremarkable except for mild pallor and soft systolic ejection murmur at left sternal edge and base. Mild essential tremor of right hand. I think with her history and new EKG findings she should have cath. Cath lab is full today. Her cardiac enzymes are negative so far. Will start IV nitroglycerine and if enzymes turn positive will add IV heparin. Plan on cath tomorrow with BMS or angioplasty alone if PCI needed, to avoid long term need for DAPT. Agree with assessment and plan as noted above.

## 2014-04-05 NOTE — ED Notes (Signed)
MD at bedside. 

## 2014-04-05 NOTE — ED Notes (Signed)
Per EMS, patient complains of dull ache in mid chest that resolved with 4 baby ASA, 1 NTG.   20 L hand IV placed by EMS.   Patient states she is still having light pain in mid to left chest, but "it is better".  Patient states SOB is better than when she woke up this morning.   Patient states she did have some dizziness this morning with the chest pain, but claims it is getting better.   Patient denies N/V, denies syncope.

## 2014-04-06 ENCOUNTER — Encounter (HOSPITAL_COMMUNITY): Payer: Self-pay | Admitting: Cardiology

## 2014-04-06 ENCOUNTER — Encounter (HOSPITAL_COMMUNITY)
Admission: EM | Disposition: A | Discharge status: Home / Self Care | Payer: Self-pay | Source: Home / Self Care | Attending: Emergency Medicine

## 2014-04-06 DIAGNOSIS — I480 Paroxysmal atrial fibrillation: Secondary | ICD-10-CM

## 2014-04-06 DIAGNOSIS — I272 Other secondary pulmonary hypertension: Secondary | ICD-10-CM | POA: Diagnosis not present

## 2014-04-06 DIAGNOSIS — I2510 Atherosclerotic heart disease of native coronary artery without angina pectoris: Secondary | ICD-10-CM

## 2014-04-06 DIAGNOSIS — E785 Hyperlipidemia, unspecified: Secondary | ICD-10-CM

## 2014-04-06 DIAGNOSIS — I10 Essential (primary) hypertension: Secondary | ICD-10-CM | POA: Diagnosis not present

## 2014-04-06 DIAGNOSIS — I25110 Atherosclerotic heart disease of native coronary artery with unstable angina pectoris: Secondary | ICD-10-CM | POA: Diagnosis not present

## 2014-04-06 DIAGNOSIS — I270 Primary pulmonary hypertension: Secondary | ICD-10-CM

## 2014-04-06 HISTORY — PX: PERCUTANEOUS CORONARY STENT INTERVENTION (PCI-S): SHX5485

## 2014-04-06 HISTORY — PX: CARDIAC CATHETERIZATION: SHX172

## 2014-04-06 LAB — URINALYSIS, ROUTINE W REFLEX MICROSCOPIC
BILIRUBIN URINE: NEGATIVE
GLUCOSE, UA: NEGATIVE mg/dL
HGB URINE DIPSTICK: NEGATIVE
Ketones, ur: NEGATIVE mg/dL
Leukocytes, UA: NEGATIVE
Nitrite: POSITIVE — AB
Protein, ur: NEGATIVE mg/dL
SPECIFIC GRAVITY, URINE: 1.011 (ref 1.005–1.030)
Urobilinogen, UA: 1 mg/dL (ref 0.0–1.0)
pH: 7.5 (ref 5.0–8.0)

## 2014-04-06 LAB — POCT I-STAT 3, VENOUS BLOOD GAS (G3P V)
BICARBONATE: 25.2 meq/L — AB (ref 20.0–24.0)
O2 Saturation: 69 %
PCO2 VEN: 42.9 mmHg — AB (ref 45.0–50.0)
TCO2: 26 mmol/L (ref 0–100)
pH, Ven: 7.376 — ABNORMAL HIGH (ref 7.250–7.300)
pO2, Ven: 37 mmHg (ref 30.0–45.0)

## 2014-04-06 LAB — LIPID PANEL
CHOL/HDL RATIO: 2.5 ratio
Cholesterol: 144 mg/dL (ref 0–200)
HDL: 58 mg/dL (ref >39)
LDL Cholesterol: 75 mg/dL (ref 0–99)
TRIGLYCERIDES: 56 mg/dL (ref <150)
VLDL: 11 mg/dL (ref 0–40)

## 2014-04-06 LAB — BASIC METABOLIC PANEL
Anion gap: 5 (ref 5–15)
BUN: 10 mg/dL (ref 6–23)
CHLORIDE: 104 meq/L (ref 96–112)
CO2: 28 mmol/L (ref 19–32)
Calcium: 8.6 mg/dL (ref 8.4–10.5)
Creatinine, Ser: 0.78 mg/dL (ref 0.50–1.10)
GFR calc non Af Amer: 71 mL/min — ABNORMAL LOW (ref >90)
GFR, EST AFRICAN AMERICAN: 82 mL/min — AB (ref >90)
GLUCOSE: 101 mg/dL — AB (ref 70–99)
POTASSIUM: 3.5 mmol/L (ref 3.5–5.1)
SODIUM: 137 mmol/L (ref 135–145)

## 2014-04-06 LAB — CBC
HEMATOCRIT: 37.2 % (ref 36.0–46.0)
HEMOGLOBIN: 11.8 g/dL — AB (ref 12.0–15.0)
MCH: 28.5 pg (ref 26.0–34.0)
MCHC: 31.7 g/dL (ref 30.0–36.0)
MCV: 89.9 fL (ref 78.0–100.0)
Platelets: 168 10*3/uL (ref 150–400)
RBC: 4.14 MIL/uL (ref 3.87–5.11)
RDW: 15.6 % — AB (ref 11.5–15.5)
WBC: 5.4 10*3/uL (ref 4.0–10.5)

## 2014-04-06 LAB — POCT I-STAT 3, ART BLOOD GAS (G3+)
ACID-BASE DEFICIT: 1 mmol/L (ref 0.0–2.0)
Bicarbonate: 24.3 mEq/L — ABNORMAL HIGH (ref 20.0–24.0)
O2 SAT: 93 %
PO2 ART: 70 mmHg — AB (ref 80.0–100.0)
TCO2: 26 mmol/L (ref 0–100)
pCO2 arterial: 40.9 mmHg (ref 35.0–45.0)
pH, Arterial: 7.382 (ref 7.350–7.450)

## 2014-04-06 LAB — URINE MICROSCOPIC-ADD ON

## 2014-04-06 LAB — TROPONIN I: Troponin I: <0.03 ng/mL (ref <0.031)

## 2014-04-06 SURGERY — PERCUTANEOUS CORONARY STENT INTERVENTION (PCI-S)

## 2014-04-06 MED ORDER — HYDRALAZINE HCL 20 MG/ML IJ SOLN
INTRAMUSCULAR | Status: AC
  Filled 2014-04-06: qty 1

## 2014-04-06 MED ORDER — HEPARIN (PORCINE) IN NACL 2-0.9 UNIT/ML-% IJ SOLN
INTRAMUSCULAR | Status: AC
  Filled 2014-04-06: qty 500

## 2014-04-06 MED ORDER — LIDOCAINE HCL (PF) 1 % IJ SOLN
INTRAMUSCULAR | Status: AC
  Filled 2014-04-06: qty 30

## 2014-04-06 MED ORDER — HEPARIN (PORCINE) IN NACL 2-0.9 UNIT/ML-% IJ SOLN
INTRAMUSCULAR | Status: AC
  Filled 2014-04-06: qty 1000

## 2014-04-06 MED ORDER — HYDRALAZINE HCL 20 MG/ML IJ SOLN: 10.0000 mg | INTRAMUSCULAR | Status: DC | PRN

## 2014-04-06 MED ORDER — SODIUM CHLORIDE 0.9 % IV SOLN: 1.0000 mL/kg/h | INTRAVENOUS | Status: AC

## 2014-04-06 MED ORDER — NITROGLYCERIN 1 MG/10 ML FOR IR/CATH LAB
INTRA_ARTERIAL | Status: AC
  Filled 2014-04-06: qty 10

## 2014-04-06 NOTE — H&P (View-Only) (Signed)
 Subjective: NO CP  Feels a little woozy  Has a headache  No SOB  Objective: Filed Vitals:   04/05/14 2350 04/06/14 0416 04/06/14 0550 04/06/14 0738  BP: 142/80 174/66 171/60 162/64  Pulse: 73 70 70   Temp:  97.7 F (36.5 C)  98 F (36.7 C)  TempSrc:  Oral    Resp: 26 23 16 17  Height:      Weight:  132 lb 0.9 oz (59.9 kg)    SpO2: 100% 100% 94% 96%   Weight change:   Intake/Output Summary (Last 24 hours) at 04/06/14 0913 Last data filed at 04/06/14 0600  Gross per 24 hour  Intake  364.2 ml  Output    150 ml  Net  214.2 ml    General: Alert, awake, oriented x3, in no acute distress Neck:  JVP is normal Heart: Regular rate and rhythm, without murmurs, rubs, gallops.  Lungs: Clear to auscultation.  No rales or wheezes. Abd:  Mild suprapubic tenderness Exemities:  No edema.   Neuro: Grossly intact, nonfocal.  Tele:  AV paced   Lab Results: Results for orders placed or performed during the hospital encounter of 04/05/14 (from the past 24 hour(s))  MRSA PCR Screening     Status: None   Collection Time: 04/05/14  2:22 PM  Result Value Ref Range   MRSA by PCR NEGATIVE NEGATIVE  TSH     Status: None   Collection Time: 04/05/14  3:31 PM  Result Value Ref Range   TSH 0.684 0.350 - 4.500 uIU/mL  Magnesium     Status: None   Collection Time: 04/05/14  3:31 PM  Result Value Ref Range   Magnesium 2.0 1.5 - 2.5 mg/dL  Troponin I-(serum)     Status: None   Collection Time: 04/05/14  3:31 PM  Result Value Ref Range   Troponin I <0.03 <0.031 ng/mL  Protime-INR     Status: None   Collection Time: 04/05/14  3:31 PM  Result Value Ref Range   Prothrombin Time 12.0 11.6 - 15.2 seconds   INR 0.88 0.00 - 1.49  Hepatic function panel     Status: Abnormal   Collection Time: 04/05/14  3:31 PM  Result Value Ref Range   Total Protein 6.1 6.0 - 8.3 g/dL   Albumin 3.4 (L) 3.5 - 5.2 g/dL   AST 36 0 - 37 U/L   ALT 27 0 - 35 U/L   Alkaline Phosphatase 122 (H) 39 - 117 U/L   Total  Bilirubin 0.6 0.3 - 1.2 mg/dL   Bilirubin, Direct 0.2 0.0 - 0.3 mg/dL   Indirect Bilirubin 0.4 0.3 - 0.9 mg/dL  Troponin I-(serum)     Status: None   Collection Time: 04/05/14  7:56 PM  Result Value Ref Range   Troponin I <0.03 <0.031 ng/mL  Troponin I-(serum)     Status: None   Collection Time: 04/06/14  1:36 AM  Result Value Ref Range   Troponin I <0.03 <0.031 ng/mL  CBC     Status: Abnormal   Collection Time: 04/06/14  1:36 AM  Result Value Ref Range   WBC 5.4 4.0 - 10.5 K/uL   RBC 4.14 3.87 - 5.11 MIL/uL   Hemoglobin 11.8 (L) 12.0 - 15.0 g/dL   HCT 37.2 36.0 - 46.0 %   MCV 89.9 78.0 - 100.0 fL   MCH 28.5 26.0 - 34.0 pg   MCHC 31.7 30.0 - 36.0 g/dL   RDW 15.6 (H) 11.5 -   15.5 %   Platelets 168 150 - 400 K/uL  Basic metabolic panel     Status: Abnormal   Collection Time: 04/06/14  1:36 AM  Result Value Ref Range   Sodium 137 135 - 145 mmol/L   Potassium 3.5 3.5 - 5.1 mmol/L   Chloride 104 96 - 112 mEq/L   CO2 28 19 - 32 mmol/L   Glucose, Bld 101 (H) 70 - 99 mg/dL   BUN 10 6 - 23 mg/dL   Creatinine, Ser 0.78 0.50 - 1.10 mg/dL   Calcium 8.6 8.4 - 10.5 mg/dL   GFR calc non Af Amer 71 (L) >90 mL/min   GFR calc Af Amer 82 (L) >90 mL/min   Anion gap 5 5 - 15  Lipid panel     Status: None   Collection Time: 04/06/14  1:36 AM  Result Value Ref Range   Cholesterol 144 0 - 200 mg/dL   Triglycerides 56 <150 mg/dL   HDL 58 >39 mg/dL   Total CHOL/HDL Ratio 2.5 RATIO   VLDL 11 0 - 40 mg/dL   LDL Cholesterol 75 0 - 99 mg/dL  Urinalysis, Routine w reflex microscopic     Status: Abnormal   Collection Time: 04/06/14  5:20 AM  Result Value Ref Range   Color, Urine YELLOW YELLOW   APPearance CLOUDY (A) CLEAR   Specific Gravity, Urine 1.011 1.005 - 1.030   pH 7.5 5.0 - 8.0   Glucose, UA NEGATIVE NEGATIVE mg/dL   Hgb urine dipstick NEGATIVE NEGATIVE   Bilirubin Urine NEGATIVE NEGATIVE   Ketones, ur NEGATIVE NEGATIVE mg/dL   Protein, ur NEGATIVE NEGATIVE mg/dL   Urobilinogen, UA  1.0 0.0 - 1.0 mg/dL   Nitrite POSITIVE (A) NEGATIVE   Leukocytes, UA NEGATIVE NEGATIVE  Urine microscopic-add on     Status: Abnormal   Collection Time: 04/06/14  5:20 AM  Result Value Ref Range   Squamous Epithelial / LPF RARE RARE   WBC, UA 3-6 <3 WBC/hpf   RBC / HPF 0-2 <3 RBC/hpf   Bacteria, UA MANY (A) RARE   Casts HYALINE CASTS (A) NEGATIVE   Crystals CA OXALATE CRYSTALS (A) NEGATIVE    Studies/Results: Dg Chest 2 View  04/05/2014   CLINICAL DATA:  Chest pain and shortness of breath today.  EXAM: CHEST  2 VIEW  COMPARISON:  10/20/2013  FINDINGS: Right AICD leads overlie the right atrium and right ventricle. The heart is enlarged. There is minimal right basilar atelectasis. No focal consolidations or pleural effusions. No pulmonary edema. Left upper quadrant calcification likely represents a splenic artery aneurysm and appears stable over multiple prior studies measuring less than 2 cm in diameter.  IMPRESSION: 1. Cardiomegaly. 2.  No evidence for acute cardiopulmonary abnormality.   Electronically Signed   By: Beth  Brown M.D.   On: 04/05/2014 09:32    Medications:  Reviewed     @PROBHOSP@  1.  CP  For cath to redefine anatomy   Has r/o for mi  2.  HL  LDL is 75   Would keep on same regimen.    3.  HTN  BP is up  Will cut back on NTG with HA  She is on midodrine for syncope from orthostatic intolerance.  Followed by M Croituru in clinic  Goal for BP 140 to 160   Would hold further while supine  Resume at D/C.  4  Afib  Patinet on amiodarone.  No anticoag due to hx of retroperitoneal hematoma.    No plavix  ASA only  5  PPM  Prolonged AV delay purposeful.   4.  UTI  Pt with UTI  On Trimpex   Culture pending.   Will review with pharmacy.  Had klebsiella in 2014.   With long history of UTI will wait for urine cult results before switching    LOS: 1 day   Makai Agostinelli 04/06/2014, 9:13 AM   

## 2014-04-06 NOTE — Progress Notes (Signed)
Subjective: NO CP  Feels a little woozy  Has a headache  No SOB  Objective: Filed Vitals:   04/05/14 2350 04/06/14 0416 04/06/14 0550 04/06/14 0738  BP: 142/80 174/66 171/60 162/64  Pulse: 73 70 70   Temp:  97.7 F (36.5 C)  98 F (36.7 C)  TempSrc:  Oral    Resp: Height:      Weight:  132 lb 0.9 oz (59.9 kg)    SpO2: 100% 100% 94% 96%   Weight change:   Intake/Output Summary (Last 24 hours) at 04/06/14 0913 Last data filed at 04/06/14 0600  Gross per 24 hour  Intake  364.2 ml  Output    150 ml  Net  214.2 ml    General: Alert, awake, oriented x3, in no acute distress Neck:  JVP is normal Heart: Regular rate and rhythm, without murmurs, rubs, gallops.  Lungs: Clear to auscultation.  No rales or wheezes. Abd:  Mild suprapubic tenderness Exemities:  No edema.   Neuro: Grossly intact, nonfocal.  Tele:  AV paced   Lab Results: Results for orders placed or performed during the hospital encounter of 04/05/14 (from the past 24 hour(s))  MRSA PCR Screening     Status: None   Collection Time: 04/05/14  2:22 PM  Result Value Ref Range   MRSA by PCR NEGATIVE NEGATIVE  TSH     Status: None   Collection Time: 04/05/14  3:31 PM  Result Value Ref Range   TSH 0.684 0.350 - 4.500 uIU/mL  Magnesium     Status: None   Collection Time: 04/05/14  3:31 PM  Result Value Ref Range   Magnesium 2.0 1.5 - 2.5 mg/dL  Troponin I-(serum)     Status: None   Collection Time: 04/05/14  3:31 PM  Result Value Ref Range   Troponin I <0.03 <0.031 ng/mL  Protime-INR     Status: None   Collection Time: 04/05/14  3:31 PM  Result Value Ref Range   Prothrombin Time 12.0 11.6 - 15.2 seconds   INR 0.88 0.00 - 1.49  Hepatic function panel     Status: Abnormal   Collection Time: 04/05/14  3:31 PM  Result Value Ref Range   Total Protein 6.1 6.0 - 8.3 g/dL   Albumin 3.4 (L) 3.5 - 5.2 g/dL   AST 36 0 - 37 U/L   ALT 27 0 - 35 U/L   Alkaline Phosphatase 122 (H) 39 - 117 U/L   Total  Bilirubin 0.6 0.3 - 1.2 mg/dL   Bilirubin, Direct 0.2 0.0 - 0.3 mg/dL   Indirect Bilirubin 0.4 0.3 - 0.9 mg/dL  Troponin I-(serum)     Status: None   Collection Time: 04/05/14  7:56 PM  Result Value Ref Range   Troponin I <0.03 <0.031 ng/mL  Troponin I-(serum)     Status: None   Collection Time: 04/06/14  1:36 AM  Result Value Ref Range   Troponin I <0.03 <0.031 ng/mL  CBC     Status: Abnormal   Collection Time: 04/06/14  1:36 AM  Result Value Ref Range   WBC 5.4 4.0 - 10.5 K/uL   RBC 4.14 3.87 - 5.11 MIL/uL   Hemoglobin 11.8 (L) 12.0 - 15.0 g/dL   HCT 16.1 09.6 - 04.5 %   MCV 89.9 78.0 - 100.0 fL   MCH 28.5 26.0 - 34.0 pg   MCHC 31.7 30.0 - 36.0 g/dL   RDW 40.9 (H) 81.1 -  15.5 %   Platelets 168 150 - 400 K/uL  Basic metabolic panel     Status: Abnormal   Collection Time: 04/06/14  1:36 AM  Result Value Ref Range   Sodium 137 135 - 145 mmol/L   Potassium 3.5 3.5 - 5.1 mmol/L   Chloride 104 96 - 112 mEq/L   CO2 28 19 - 32 mmol/L   Glucose, Bld 101 (H) 70 - 99 mg/dL   BUN 10 6 - 23 mg/dL   Creatinine, Ser 6.04 0.50 - 1.10 mg/dL   Calcium 8.6 8.4 - 54.0 mg/dL   GFR calc non Af Amer 71 (L) >90 mL/min   GFR calc Af Amer 82 (L) >90 mL/min   Anion gap 5 5 - 15  Lipid panel     Status: None   Collection Time: 04/06/14  1:36 AM  Result Value Ref Range   Cholesterol 144 0 - 200 mg/dL   Triglycerides 56 <981 mg/dL   HDL 58 >19 mg/dL   Total CHOL/HDL Ratio 2.5 RATIO   VLDL 11 0 - 40 mg/dL   LDL Cholesterol 75 0 - 99 mg/dL  Urinalysis, Routine w reflex microscopic     Status: Abnormal   Collection Time: 04/06/14  5:20 AM  Result Value Ref Range   Color, Urine YELLOW YELLOW   APPearance CLOUDY (A) CLEAR   Specific Gravity, Urine 1.011 1.005 - 1.030   pH 7.5 5.0 - 8.0   Glucose, UA NEGATIVE NEGATIVE mg/dL   Hgb urine dipstick NEGATIVE NEGATIVE   Bilirubin Urine NEGATIVE NEGATIVE   Ketones, ur NEGATIVE NEGATIVE mg/dL   Protein, ur NEGATIVE NEGATIVE mg/dL   Urobilinogen, UA  1.0 0.0 - 1.0 mg/dL   Nitrite POSITIVE (A) NEGATIVE   Leukocytes, UA NEGATIVE NEGATIVE  Urine microscopic-add on     Status: Abnormal   Collection Time: 04/06/14  5:20 AM  Result Value Ref Range   Squamous Epithelial / LPF RARE RARE   WBC, UA 3-6 <3 WBC/hpf   RBC / HPF 0-2 <3 RBC/hpf   Bacteria, UA MANY (A) RARE   Casts HYALINE CASTS (A) NEGATIVE   Crystals CA OXALATE CRYSTALS (A) NEGATIVE    Studies/Results: Dg Chest 2 View  04/05/2014   CLINICAL DATA:  Chest pain and shortness of breath today.  EXAM: CHEST  2 VIEW  COMPARISON:  10/20/2013  FINDINGS: Right AICD leads overlie the right atrium and right ventricle. The heart is enlarged. There is minimal right basilar atelectasis. No focal consolidations or pleural effusions. No pulmonary edema. Left upper quadrant calcification likely represents a splenic artery aneurysm and appears stable over multiple prior studies measuring less than 2 cm in diameter.  IMPRESSION: 1. Cardiomegaly. 2.  No evidence for acute cardiopulmonary abnormality.   Electronically Signed   By: Rosalie Gums M.D.   On: 04/05/2014 09:32    Medications:  Reviewed     @  1.  CP  For cath to redefine anatomy   Has r/o for mi  2.  HL  LDL is 75   Would keep on same regimen.    3.  HTN  BP is up  Will cut back on NTG with HA  She is on midodrine for syncope from orthostatic intolerance.  Followed by Judie Petit Croituru in clinic  Goal for BP 140 to 160   Would hold further while supine  Resume at D/C.  4  Afib  Patinet on amiodarone.  No anticoag due to hx of retroperitoneal hematoma.  No plavix  ASA only  5  PPM  Prolonged AV delay purposeful.   4.  UTI  Pt with UTI  On Trimpex   Culture pending.   Will review with pharmacy.  Had klebsiella in 2014.   With long history of UTI will wait for urine cult results before switching    LOS: 1 day   Dietrich Patesaula Rahm Minix 04/06/2014, 9:13 AM

## 2014-04-06 NOTE — Progress Notes (Signed)
Starting tonight, pt voiding small amounts frequently and incontinent. Bladder scan was 627 ml so bed pan was encouraged frequently but unable to accurately measure urine due to pt's incontinence. Pt's urinalysis positive for nitrates, bacteria, hyaline casts, and Ca oxalate crystals. Notified MD of results.

## 2014-04-06 NOTE — CV Procedure (Signed)
    Cardiac Catheterization Procedure Note  Name: [name] Luna MRN: [id number] DOB: [birth year]  Procedure: Right Heart Cath, Left Heart Cath, Selective Coronary Angiography  Indication: 81 yo WF s/p stenting of the proximal LAD in 2008, 2011, and 2012 presents with chest pain.   Procedural Details: The right groin was prepped, draped, and anesthetized with 1% lidocaine. Using the modified Seldinger technique a 5 Fr  sheath was placed in the right femoral artery and a 5 French sheath was placed in the right femoral vein. A Swan-Ganz catheter was used for the right heart catheterization. Standard protocol was followed for recording of right heart pressures and sampling of oxygen saturations. Fick cardiac output was calculated. Standard Judkins catheters were used for selective coronary angiography and left ventricular pressures. A LA1 catheter was used to engage the RCA. There were no immediate procedural complications. The patient was transferred to the post catheterization recovery area for further monitoring.  Procedural Findings: Hemodynamics RA 4/4 mean 2 mm Hg RV 43/5 mm Hg PA 43/14 mean 27 mm Hg PCWP 11/24 mean 12 mm Hg LV 198/15 mm Hg AO 196/73 mean 121 mm Hg  Oxygen saturations: PA 69% AO 98%  Cardiac Output (Fick) 4.7 L/min  Cardiac Index (Fick) 2.86 L/min/meter squared.   Coronary angiography: Coronary dominance: right  Left mainstem: Normal.   Left anterior descending (LAD): The proximal LAD is widely patent at the prior stent sites. The first diagonal has 50-60% ostial stenosis.  Left circumflex (LCx): No obstructive disease.  Right coronary artery (RCA): Large dominant vessel with mild disease up to 20%.   Left ventriculography: not done   Final Conclusions:   1. Nonobstructive CAD 2. Mild pulmonary HTN with normal LV filling pressures.   Recommendations: medical management.   Peter SwazilandJordan, MDFACC 04/06/2014, 11:46 AM

## 2014-04-06 NOTE — Progress Notes (Signed)
UR completed 

## 2014-04-06 NOTE — Interval H&P Note (Signed)
History and Physical Interval Note:  04/06/2014 11:06 AM  [name] Luna  has presented today for surgery, with the diagnosis of cp  The various methods of treatment have been discussed with the patient and family. After consideration of risks, benefits and other options for treatment, the patient has consented to  Procedure(s): LEFT AND RIGHT HEART CATHETERIZATION WITH CORONARY ANGIOGRAM (N/A) as a surgical intervention .  The patient's history has been reviewed, patient examined, no change in status, stable for surgery.  I have reviewed the patient's chart and labs.  Questions were answered to the patient's satisfaction.    Cath Lab Visit (complete for each Cath Lab visit)  Clinical Evaluation Leading to the Procedure:   ACS: Yes.    Non-ACS:    Anginal Classification: CCS IV  Anti-ischemic medical therapy: Maximal Therapy (2 or more classes of medications)  Non-Invasive Test Results: No non-invasive testing performed  Prior CABG: No previous CABG       Theron AristaPeter Huntsville Memorial HospitalJordanMD,FACC 04/06/2014 11:06 AM

## 2014-04-06 NOTE — Progress Notes (Signed)
Pt states her head "feels funny". Neurologically intact with strong equal upper and lower body strength. Symetricle facial movement.

## 2014-04-06 NOTE — Progress Notes (Signed)
Site area: RFA Site Prior to Removal:  Level 0 Pressure Applied For:3220min Manual:   yes Patient Status During Pull:  stable Post Pull Site:  Level 0 Post Pull Instructions Given:  yes Post Pull Pulses Present: palpable Dressing Applied:  clear Bedrest begins @ 1245 Comments:

## 2014-04-07 DIAGNOSIS — R072 Precordial pain: Secondary | ICD-10-CM

## 2014-04-07 DIAGNOSIS — I369 Nonrheumatic tricuspid valve disorder, unspecified: Secondary | ICD-10-CM

## 2014-04-07 DIAGNOSIS — I25110 Atherosclerotic heart disease of native coronary artery with unstable angina pectoris: Secondary | ICD-10-CM | POA: Diagnosis not present

## 2014-04-07 DIAGNOSIS — Z8679 Personal history of other diseases of the circulatory system: Secondary | ICD-10-CM

## 2014-04-07 DIAGNOSIS — I10 Essential (primary) hypertension: Secondary | ICD-10-CM

## 2014-04-07 NOTE — Progress Notes (Signed)
Echocardiogram 2D Echocardiogram has been performed.  Dorothey BasemanReel, Imaad Reuss M 04/07/2014, 12:37 PM

## 2014-04-07 NOTE — Progress Notes (Signed)
UR completed 

## 2014-04-07 NOTE — Evaluation (Signed)
Occupational Therapy Evaluation Patient Details Name: [name] Luna MRN: [id number] DOB: 01-07-1921 Today's Date: 04/07/2014    History of Present Illness Pt is a 81 y/o female presenting with chest pain. Day of admission she awoke around 3am with substernal chest pain across her lower chest. She felt SOB. Symptoms persisted around 3/10 and finally she told her son around [place].  She does endorse feeling weak all over.    Clinical Impression   Pt was performing her sponge bathing and dressing with set up, using a RW and toileting with a BSC prior to the last 2 weeks when she became increasingly weaker.  Pt with complaints of dizziness with initially sitting up and just after transfers.  Pt has a hx of dizziness, with orthostatic hypotension and she has been treated for BPPV per her son. BP was 147/66 and no nystagmus was observed, but it would be beneficial to continue to monitor pt's response to movement.  Pt requiring mod to max assist for mobility performed today, not able to ambulate.  Pt will need ST rehab prior to return home with her supportive son.  Will follow acutely.    Follow Up Recommendations  SNF;Supervision/Assistance - 24 hour    Equipment Recommendations       Recommendations for Other Services       Precautions / Restrictions Precautions Precautions: Fall Restrictions Weight Bearing Restrictions: No      Mobility Bed Mobility Overal bed mobility: Needs Assistance Bed Mobility: Rolling;Supine to Sit Rolling: Min assist   Supine to sit: HOB elevated;Max assist (toward L)     General bed mobility comments: heavy use of rail and extra time, verbal and physical cues for sequencing. Assist to scoot hips to EOB.  Transfers Overall transfer level: Needs assistance Equipment used: None Transfers: Sit to/from [employer] Sit to Stand: Mod assist Stand pivot transfers: Mod assist       General transfer comment: stood and shuffled feet to  chair    Balance Overall balance assessment: Needs assistance Sitting-balance support: Feet supported Sitting balance-Leahy Scale: Poor   Postural control: Posterior lean Standing balance support: Bilateral upper extremity supported Standing balance-Leahy Scale: Poor Standing balance comment: Left lateral lean                             ADL Overall ADL's : Needs assistance/impaired Eating/Feeding: Minimal assistance;Sitting Eating/Feeding Details (indicate cue type and reason): assist to set up tray, pt with spillage Grooming: Wash/dry hands;Wash/dry face;Minimal assistance;Sitting   Upper Body Bathing: Moderate assistance;Sitting (supported sitting)   Lower Body Bathing: Total assistance;Sit to/from stand   Upper Body Dressing : Moderate assistance;Sitting   Lower Body Dressing: Total assistance;Sit to/from stand   Toilet Transfer: Moderate assistance;Stand-pivot Toilet Transfer Details (indicate cue type and reason): simulated bed to chair Toileting- Clothing Manipulation and Hygiene: Total assistance;Sit to/from stand       Functional mobility during ADLs: Moderate assistance;Cueing for sequencing (transfers only) General ADL Comments: Pt requires one hand on rail in sitting to balance.  Transferred using bear hug to offer pt more security, very fearful of falling.     Vision                     Perception     Praxis      Pertinent Vitals/Pain Pain Assessment: No/denies pain     Hand Dominance Right   Extremity/Trunk Assessment Upper Extremity Assessment Upper Extremity Assessment:  Generalized weakness;RUE deficits/detail;LUE deficits/detail RUE Deficits / Details: mild tremor RUE Coordination: decreased fine motor LUE Deficits / Details: mild tremor LUE Coordination: decreased fine motor   Lower Extremity Assessment Lower Extremity Assessment: Generalized weakness   Cervical / Trunk Assessment Cervical / Trunk Assessment:  Kyphotic   Communication Communication Communication: No difficulties   Cognition Arousal/Alertness: Awake/alert Behavior During Therapy: Anxious Overall Cognitive Status: History of cognitive impairments - at baseline       Memory: Decreased short-term memory             General Comments       Exercises       Shoulder Instructions      Home Living Family/patient expects to be discharged to:: Private residence Living Arrangements: Children (Son) Available Help at Discharge: Family;Available 24 hours/day Type of Home: House Home Access: Stairs to enter Enter[employer] CorporationEntrance Stairs-Number of Steps: 2 Entrance Stairs-Rails: Right Home Layout: One level     Bathroom Shower/Tub:  (pt sponge bathes)   Bathroom Toilet:  (pt uses BSC)     Home Equipment: Walker - 2 wheels;Cane - single point;Bedside commode;Wheelchair - manual          Prior Functioning/Environment Level of Independence: Needs assistance  Gait / Transfers Assistance Needed: son has been having to progressively help her more and more with mobility; was using RW  ADL's / Homemaking Assistance Needed: Pt reports increased spillage with self feeding due to tremor and poor vision, typically pt can sponge bathe and dress herself. Son does meal prep and housekeeping.   Comments: Goes to beauty shop for hair.    OT Diagnosis: Generalized weakness;Cognitive deficits;Blindness and low vision   OT Problem List: Decreased strength;Decreased activity tolerance;Impaired balance (sitting and/or standing);Impaired vision/perception;Decreased coordination;Decreased safety awareness;Decreased cognition;Decreased knowledge of use of DME or AE;Impaired UE functional use   OT Treatment/Interventions: Self-care/ADL training;DME and/or AE instruction;Therapeutic activities;Patient/family education;Balance training    OT Goals(Current goals can be found in the care plan section) Acute Rehab OT Goals Patient Stated Goal: to walk OT  Goal Formulation: With patient Time For Goal Achievement: 04/21/14 Potential to Achieve Goals: Good ADL Goals Pt Will Perform Grooming: with set-up;sitting (unsupported) Pt Will Perform Upper Body Bathing: with min assist;sitting (unsupported) Pt Will Perform Upper Body Dressing: with supervision;sitting (unsupported) Pt Will Transfer to Toilet: with min assist;ambulating;bedside commode Additional ADL Goal #1: Pt will sit EOB x 10 minutes unsupported while engaged in ADL. Additional ADL Goal #2: Pt will perform bed mobility with min assist.  OT Frequency: Min 2X/week   Barriers to D/C:            Co-evaluation              End of Session Equipment Utilized During Treatment: Gait belt Nurse Communication: Mobility status  Activity Tolerance: Patient tolerated treatment well Patient left: in chair;with call bell/phone within reach;with family/visitor present   Time: 1344-1418 OT Time Calculation (min): 34 min Charges:  OT General Charges $OT Visit: 1 Procedure OT Evaluation $Initial OT Evaluation Tier I: 1 Procedure OT Treatments $Self Care/Home Management : 8-22 mins G-Codes: OT G-codes **NOT FOR INPATIENT CLASS** Functional Assessment Tool Used: clinical judgement Functional Limitation: Self care Self Care Current Status ([id number]): At least 80 percent but less than 100 percent impaired, limited or restricted Self Care Goal Status ([id number]): At least 20 percent but less than 40 percent impaired, limited or restricted  Evern BioMayberry, Stephens [place] 04/07/2014, 2:23 PM  [phone]

## 2014-04-07 NOTE — Evaluation (Signed)
Physical Therapy Evaluation Patient Details Name: [name] Luna MRN: [id number] DOB: 29-May-1920 Today's Date: 04/07/2014   History of Present Illness  Pt is a 81 y/o female presenting with chest pain. Day of admission she awoke around 3am with substernal chest pain across her lower chest. She felt SOB. Symptoms persisted around 3/10 and finally she told her son around [place].  She does endorse feeling weak all over.   Clinical Impression  Pt admitted with above diagnosis. Pt currently with functional limitations due to the deficits listed below (see PT Problem List). At the time of PT eval pt required increased assist and cueing for basic transfers. Pt very fearful of falling. At times, pt extremely anxious stating she feels like she is falling - even while lying down. Unclear whether pt was experiencing vertigo.   Pt will benefit from skilled PT to increase their independence and safety with mobility to allow discharge to the venue listed below. Pt and son both agreeable to SNF at d/c.      Follow Up Recommendations SNF;Supervision/Assistance - 24 hour    Equipment Recommendations  None recommended by PT    Recommendations for Other Services       Precautions / Restrictions Precautions Precautions: Fall Restrictions Weight Bearing Restrictions: No      Mobility  Bed Mobility Overal bed mobility: Needs Assistance Bed Mobility: Rolling;Supine to Sit Rolling: Mod assist   Supine to sit: Max assist     General bed mobility comments: Max assist for sequencing, hand placement, and trunk elevation. Pt very anxious with an increased fear of falling. Pt pulling on therapist because she states she feels like she is going to slip off the EOB.   Transfers Overall transfer level: Needs assistance Equipment used: Rolling walker (2 wheeled) Transfers: Sit to/from Stand Sit to Stand: Mod assist         General transfer comment: Pt was able to power-up to full standing with mod assist  for balance and initiation. Pt with left lateral lean while standing, and was unable to correct with cueing. Overall very shaky.  Ambulation/Gait             General Gait Details: Unable at this time.   Stairs            Wheelchair Mobility    Modified Rankin (Stroke Patients Only)       Balance Overall balance assessment: Needs assistance Sitting-balance support: Feet supported;Bilateral upper extremity supported Sitting balance-Leahy Scale: Poor   Postural control: Posterior lean Standing balance support: Bilateral upper extremity supported;During functional activity Standing balance-Leahy Scale: Poor Standing balance comment: Left lateral lean                              Pertinent Vitals/Pain Pain Assessment: No/denies pain    Home Living Family/patient expects to be discharged to:: Private residence Living Arrangements: Children Available Help at Discharge: Family;Available 24 hours/day Type of Home: House Home Access: Stairs to enter Entrance Stairs-Rails: Right Entrance Stairs-Number of Steps: 2 Home Layout: One level Home Equipment: Walker - 2 wheels;Cane - single point;Bedside commode;Wheelchair - manual      Prior Function Level of Independence: Needs assistance   Gait / Transfers Assistance Needed: son has been having to progressively help her more and more with mobility; was using RW   ADL's / Homemaking Assistance Needed: requires setup and (A) for ADLs for safety; son reports due to macular degeneration pt has  been having difficulty feeding herself   Comments: Pt has been sponge bathing and dressing herself. Son assists pt for minimal walking with the RW, and has been using the wheelchair more and more the past couple of weeks.      Hand Dominance   Dominant Hand: Right    Extremity/Trunk Assessment   Upper Extremity Assessment: Generalized weakness           Lower Extremity Assessment: Generalized weakness       Cervical / Trunk Assessment: Kyphotic  Communication   Communication: No difficulties  Cognition Arousal/Alertness: Awake/alert Behavior During Therapy: Anxious Overall Cognitive Status: History of cognitive impairments - at baseline                      General Comments      Exercises        Assessment/Plan    PT Assessment Patient needs continued PT services  PT Diagnosis Difficulty walking;Generalized weakness   PT Problem List Decreased strength;Decreased range of motion;Decreased activity tolerance;Decreased balance;Decreased mobility;Decreased knowledge of use of DME;Decreased safety awareness;Decreased knowledge of precautions  PT Treatment Interventions DME instruction;Gait training;Stair training;Functional mobility training;Therapeutic activities;Therapeutic exercise;Neuromuscular re-education;Patient/family education   PT Goals (Current goals can be found in the Care Plan section) Acute Rehab PT Goals Patient Stated Goal: To do better getting up. PT Goal Formulation: With patient/family Time For Goal Achievement: 04/21/14 Potential to Achieve Goals: Good    Frequency Min 2X/week   Barriers to discharge        Co-evaluation               End of Session Equipment Utilized During Treatment: Gait belt Activity Tolerance: Patient limited by fatigue Patient left: in bed;with bed alarm set;with call bell/phone within reach;with family/visitor present Nurse Communication: Mobility status    Functional Assessment Tool Used: Clinical judgement Functional Limitation: Mobility: Walking and moving around Mobility: Walking and Moving Around Current Status ([id number](G8978): At least 40 percent but less than 60 percent impaired, limited or restricted Mobility: Walking and Moving Around Goal Status [id number]): At least 40 percent but less than 60 percent impaired, limited or restricted    Time: 1133-1201 PT Time Calculation (min) (ACUTE ONLY): 28 min   Charges:    PT Evaluation $Initial PT Evaluation Tier I: 1 Procedure PT Treatments $Therapeutic Activity: 23-37 mins   PT G Codes:   PT G-Codes **NOT FOR INPATIENT CLASS** Functional Assessment Tool Used: Clinical judgement Functional Limitation: Mobility: Walking and moving around Mobility: Walking and Moving Around Current Status ([id number](G8978): At least 40 percent but less than 60 percent impaired, limited or restricted Mobility: Walking and Moving Around Goal Status [phone](G8979): At least 40 percent but less than 60 percent impaired, limited or restricted    Conni SlipperKirkman, Maclin Guerrette 04/07/2014, 12:26 PM  Conni SlipperLaura Aniruddh Luna, PT, DPT Acute Rehabilitation Services Pager: [phone]

## 2014-04-07 NOTE — Progress Notes (Signed)
Patient Name: [name] HarrisonWinifred I Luna Date of Encounter: 04/07/2014  Principal Problem:   Unstable angina Active Problems:   Essential hypertension   CAD (coronary artery disease)   H/O cardiac pacemaker, Medtronic versa implanted 01/2008, though original implanted 2000 for syncope and heart block and ventricular asystole.   Paroxysmal a-fib, history of, off coumadin since 10/2010, without recurrence of afib.   Hyperlipidemia   Hematoma of iliopsoas muscle, 05/06/13, spontaneous bleed, seen on CT scan   H/O orthostatic hypotension   Primary Cardiologist: Croituru     SUBJECTIVE: L chest hurts  Has had for years    OBJECTIVE Filed Vitals:   04/06/14 2342 04/07/14 0225 04/07/14 0416 04/07/14 0744  BP: 176/54 132/41 148/57 184/67  Pulse: 79 70 77 78  Temp: 98.2 F (36.8 C)  98 F (36.7 C) 97.8 F (36.6 C)  TempSrc: Oral  Oral Oral  Resp: 22  18 16   Height:      Weight:   136 lb (61.689 kg)   SpO2: 98% 95% 95% 95%    Intake/Output Summary (Last 24 hours) at 04/07/14 0802 Last data filed at 04/06/14 2200  Gross per 24 hour  Intake    480 ml  Output    200 ml  Net    280 ml   Filed Weights   04/05/14 1406 04/06/14 0416 04/07/14 0416  Weight: 128 lb 14.4 oz (58.469 kg) 132 lb 0.9 oz (59.9 kg) 136 lb (61.689 kg)    PHYSICAL EXAM General: Well developed, well nourished, female in no acute distress. Head: Normocephalic, atraumatic.  Neck: Supple without bruits, JVD. Lungs:  Resp regular and unlabored, CTA. Chest:  Tender on palpation of L chest   Heart: RRR, S1, S2, no S3, S4, or murmur; no rub. Abdomen: Soft, non-tender, non-distended, BS + x 4.  Extremities: No clubbing, cyanosis, edema.  Neuro: Alert and oriented X 3. Moves all extremities spontaneously. Psych: Normal affect.  LABS: CBC: Recent Labs  04/05/14 0840 04/06/14 0136  WBC 5.2 5.4  HGB 11.3* 11.8*  HCT 35.4* 37.2  MCV 89.2 89.9  PLT 167 168   INR: Recent Labs  04/05/14 1531  INR 0.88    Basic Metabolic Panel: Recent Labs  04/05/14 0840 04/05/14 1531 04/06/14 0136  NA 140  --  137  K 3.8  --  3.5  CL 106  --  104  CO2 28  --  28  GLUCOSE 97  --  101*  BUN 8  --  10  CREATININE 0.77  --  0.78  CALCIUM 8.6  --  8.6  MG  --  2.0  --    Liver Function Tests: Recent Labs  04/05/14 1531  AST 36  ALT 27  ALKPHOS 122*  BILITOT 0.6  PROT 6.1  ALBUMIN 3.4*   Cardiac Enzymes: Recent Labs  04/05/14 1531 04/05/14 1956 04/06/14 0136  TROPONINI <0.03 <0.03 <0.03    Recent Labs  04/05/14 0842  TROPIPOC 0.01   BNP: PRO B NATRIURETIC PEPTIDE (BNP)  Date/Time Value Ref Range Status  02/13/2013 01:51 PM 507.6* 0 - 450 pg/mL Final  02/06/2012 02:01 PM 931.5* 0 - 450 pg/mL Final   D-dimer:No results for input(s): DDIMER in the last 72 hours. Hemoglobin A1C:No results for input(s): HGBA1C in the last 72 hours. Fasting Lipid Panel: Recent Labs  04/06/14 0136  CHOL 144  HDL 58  LDLCALC 75  TRIG 56  CHOLHDL 2.5   Thyroid Function Tests: Recent Labs  04/05/14 1531  TSH 0.684   Anemia Panel:No results for input(s): VITAMINB12, FOLATE, FERRITIN, TIBC, IRON, RETICCTPCT in the last 72 hours.  TELE:      AV paced    Radiology/Studies: Dg Chest 2 View  04/05/2014   CLINICAL DATA:  Chest pain and shortness of breath today.  EXAM: CHEST  2 VIEW  COMPARISON:  10/20/2013  FINDINGS: Right AICD leads overlie the right atrium and right ventricle. The heart is enlarged. There is minimal right basilar atelectasis. No focal consolidations or pleural effusions. No pulmonary edema. Left upper quadrant calcification likely represents a splenic artery aneurysm and appears stable over multiple prior studies measuring less than 2 cm in diameter.  IMPRESSION: 1. Cardiomegaly. 2.  No evidence for acute cardiopulmonary abnormality.   Electronically Signed   By: Rosalie Gums M.D.   On: 04/05/2014 09:32     Current Medications:  . amiodarone  200 mg Oral Daily  . aspirin EC   81 mg Oral Daily  . bisacodyl  5 mg Oral BID  . levothyroxine  100 mcg Oral QAC breakfast  . loratadine  10 mg Oral Daily  . metoprolol  25 mg Oral BID  . pantoprazole  40 mg Oral BID  . ranolazine  500 mg Oral Daily  . simvastatin  20 mg Oral QHS  . traMADol  25 mg Oral QHS  . trimethoprim  100 mg Oral QHS      ASSESSMENT AND PLAN: Principal Problem:   CP   s/p cath yesterday showing  non-obs CAD  Continue medical Rx   Active Problems:    Essential hypertension BP is labile  Follow here  Would address as outpatitnet      CAD (coronary artery disease)  As above    H/O cardiac pacemaker, Medtronic versa implanted 01/2008, though original implanted 2000 for syncope and heart block and ventricular asystole.    Paroxysmal a-fib, history of, off coumadin since 10/2010  Not a coumadin candidate  No documented recurrence of afib.    Hyperlipidemia    Hematoma of iliopsoas muscle, 05/06/13, spontaneous bleed, seen on CT scan    H/O orthostatic hypotension  Is on midodrine 1x per day as outpaitnet  Rx by M Croituru  Hx of syncope  Stopped here because she has been supine.  Consider restart at  5 mg  WIll need to be followed closely with HTN       UTI  Urine cult is pending  I would not switch ABX until back.     Signed, Theodore Demark , PA-C 8:02 AM 04/07/2014 Patinet seen and examined  Agree with findings of R Barrett above  I have amended note. Patinet still with CP this AM  When I press on L chest she has the pain  Appears musculoskeletal Will need rest Otherwise exam is remarkable for hypertension (BP is labile)  Recomm:  As noted above.    Dietrich Pates

## 2014-04-08 DIAGNOSIS — I25110 Atherosclerotic heart disease of native coronary artery with unstable angina pectoris: Secondary | ICD-10-CM | POA: Diagnosis not present

## 2014-04-08 LAB — URINE CULTURE: Colony Count: >=100000

## 2014-04-08 MED ORDER — CIPROFLOXACIN HCL 500 MG PO TABS
250.0000 mg | ORAL_TABLET | Freq: Two times a day (BID) | ORAL | Status: DC
  Administered 2014-04-08: 250 mg via ORAL
  Filled 2014-04-08: qty 1

## 2014-04-08 MED ORDER — METOPROLOL TARTRATE 25 MG PO TABS: 50.0000 mg | ORAL_TABLET | Freq: Two times a day (BID) | ORAL | Status: AC

## 2014-04-08 MED ORDER — TRAMADOL HCL 50 MG PO TABS: 25.0000 mg | ORAL_TABLET | Freq: Every day | ORAL | Status: AC

## 2014-04-08 MED ORDER — CEPHALEXIN 500 MG PO CAPS: 500.0000 mg | ORAL_CAPSULE | Freq: Two times a day (BID) | ORAL | Status: DC

## 2014-04-08 MED ORDER — MIDODRINE HCL 5 MG PO TABS: 2.5000 mg | ORAL_TABLET | Freq: Once | ORAL | Status: DC

## 2014-04-08 MED ORDER — TRIMETHOPRIM 100 MG PO TABS: 100.0000 mg | ORAL_TABLET | Freq: Every day | ORAL | Status: AC

## 2014-04-08 MED ORDER — SODIUM CHLORIDE 0.9 % IV BOLUS (SEPSIS)
500.0000 mL | Freq: Once | INTRAVENOUS | Status: AC
  Administered 2014-04-08: 500 mL via INTRAVENOUS

## 2014-04-08 NOTE — Progress Notes (Signed)
UR completed 

## 2014-04-08 NOTE — Progress Notes (Signed)
Clinical Social Work Department BRIEF PSYCHOSOCIAL ASSESSMENT 04/08/2014  Patient:  [name] Luna,[name]     Account Number:  [id number]     Admit date:  04/05/2014  Clinical Social Worker:  Harless NakayamaAMBELAL,Anjanae Woehrle, LCSWA  Date/Time:  04/08/2014 11:00 AM  Referred by:  Physician  Date Referred:  04/08/2014 Referred for  SNF Placement   Other Referral:   Interview type:  Family Other interview type:   Pt present for discussion but assessment completed with pt son    PSYCHOSOCIAL DATA Living Status:  WITH ADULT CHILDREN Admitted from facility:   Level of care:   Primary support name:  [name] Luna Primary support relationship to patient:   Degree of support available:    CURRENT CONCERNS Current Concerns  Post-Acute Placement   Other Concerns:    SOCIAL WORK ASSESSMENT / PLAN CSW visited pt room and spoke with pt son about placement and insurance. Pt son very frustrated to hear that pt does not qualify for Medicare coverage of SNF. Pt son expressed to CSW that he absolutely would care for pt at home but at this time he does not feel that is in patient's best interest. Pt son informed CSW that he does not understand how he is expected to get pt to bedside commode or do anything at home when even with 2+ assist here at the hospital pt is unable to do these things. CSW offered support. CSW asked if pt is able to pay privately for SNF. Pt son informed CSW this was not an option. CSW notified pt about LOG process and that CSW can see if pt is appropriate and supervisor will offer this. CSW did disclose that placement with LOG may need to be outside the county. Pt son not pleased with this but did express that he would appreciate anything CSW can do.    CSW spoke with supervisor and was informed that pt will be eligible for letter of guarantee.   Assessment/plan status:  Psychosocial Support/Ongoing Assessment of Needs Other assessment/ plan:   Information/referral to community resources:   SNF list  to be provided with bed offers    PATIENT'S/FAMILY'S RESPONSE TO PLAN OF CARE: Pt son very frustrated with system/insurance guidelines. He is [facility].      Trellis Guirguis, LCSWA  [phone]

## 2014-04-08 NOTE — Progress Notes (Signed)
CSW (Clinical Social Worker) prepared pt dc packet and placed with shadow chart. CSW arranged non-emergent ambulance transport. Pt, pt family, pt nurse, and facility informed. CSW signing off.  Aashi Derrington, LCSWA [phone]  

## 2014-04-08 NOTE — Clinical Social Work Placement (Signed)
     Clinical Social Work Department CLINICAL SOCIAL WORK PLACEMENT NOTE 04/08/2014  Patient:  [name],Mischelle I  Account Number:  [id number] Admit date:  04/05/2014  Clinical Social Worker:  Sharol HarnessPOONUM Delania Ferg, Theresia MajorsLCSWA  Date/time:  04/08/2014 11:45 AM  Clinical Social Work is seeking post-discharge placement for this patient at the following level of care:   SKILLED NURSING   (*CSW will update this form in Epic as items are completed)   04/08/2014  Patient/family provided with [facility] Department of Clinical Social Works list of facilities offering this level of care within the geographic area requested by the patient (or if unable, by the patients family).  04/08/2014  Patient/family informed of their freedom to choose among providers that offer the needed level of care, that participate in Medicare, Medicaid or managed care program needed by the patient, have an available bed and are willing to accept the patient.  04/08/2014  Patient/family informed of [facility] ownership interest in [facility], as well as of the fact that they are under no obligation to receive care at this facility.  PASARR submitted to EDS on  PASARR number received on   FL2 transmitted to all facilities in geographic area requested by pt/family on  04/08/2014 FL2 transmitted to all facilities within larger geographic area on   Patient informed that his/her managed care company has contracts with or will negotiate with  certain facilities, including the following:     Patient/family informed of bed offers received:  04/08/2014 [facility] [place] Physician recommends and patient chooses bed at    [facility] [place] on  04/08/2014 Patient to be transferred to facility by [facility] Patient and family notified of transfer on 04/08/2014 Name of family member notified:  Beau FannyLarry Kops  The following physician request were entered  in Epic: Physician Request  Please sign FL2.    Additional CommentsSharol Harness:    Pennelope Basque, LCSWA [phone]

## 2014-04-08 NOTE — Discharge Summary (Signed)
Report given to [facility] at [facility].

## 2014-04-08 NOTE — Progress Notes (Signed)
Occupational Therapy Treatment Patient Details Name: TRACEE MCCREERY MRN: [id number] DOB: 1920-05-24 Today's Date: 04/08/2014    History of present illness Pt is a 81 y/o female presenting with chest pain. Day of admission she awoke around 3am with substernal chest pain across her lower chest. She felt SOB. Symptoms persisted around 3/10 and finally she told her son around [place].  She does endorse feeling weak all over.    OT comments  Pt seen today for ADLs. Pt denies dizziness today, however continues to be limited by generalized weakness and gross tremors with activity. Pt requiring mod (A) for ADLs and transfers and requires total (A) for LB dressing and toilet hygiene. Pt has a son who provides care, however am concerned about pt's fall risk and level of care required by her son/caregiver. If pt unable to go to SNF, pt will need HHOT and HHaide as well as 24/7 Physical Assist.    Follow Up Recommendations  SNF;Supervision/Assistance - 24 hour;Other (comment) (if d/c home, pt will need HHOT and HHaide)    Equipment Recommendations  None recommended by OT    Recommendations for Other Services      Precautions / Restrictions Precautions Precautions: Fall Restrictions Weight Bearing Restrictions: No       Mobility Bed Mobility Overal bed mobility: Needs Assistance Bed Mobility: Supine to Sit     Supine to sit: Min assist;HOB elevated     General bed mobility comments: Hand held assist and use of bed rail. Min (A) to elevate trunk off bed.   Transfers Overall transfer level: Needs assistance Equipment used: Rolling walker (2 wheeled) Transfers: Sit to/from [employer] Sit to Stand: Mod assist Stand pivot transfers: Mod assist       General transfer comment: Min (A) to stand initially, however pt required VC's to lean forward and mod (A) to power up after the first time. Shakiness/tremors when standing or sitting EOB. Mod A to control RW and to provide  balance during stand-pivot.         ADL Overall ADL's : Needs assistance/impaired     Grooming: Brushing hair;Sitting;Minimal assistance (EOB) Grooming Details (indicate cue type and reason): pt's tremors interfere with grooming. Pt able to sit EOB with feet flat on floor without UE support (initially fearful and holding on to rail with 1 UE).                  Toilet Transfer: Moderate assistance;Stand-pivot;RW Toilet Transfer Details (indicate cue type and reason): simulated bed to chair Toileting- Clothing Manipulation and Hygiene: Total assistance;Sit to/from stand;+2 for safety/equipment Toileting - Clothing Manipulation Details (indicate cue type and reason): Pt required Bil UE support on RW. While performing pericare, pt fell back into chair without warning. When asked if she felt fatigued, pt replied that she was.        General ADL Comments: Pt's tremors interfere with her ability to perform self-care tasks and demonstrates continued balance deficits. Pt initial sit<>stand with min (A) however quickly fatigued and required higher level of physical assist for transfers and toilet hygiene. Pt does not appear to understand safety concerns and states she will be "ok once I start walking" however fatigued with sit<>stand x3.                 Cognition  Arousal/Alertness: Awake/Alert Behavior During Therapy: Anxious Overall Cognitive Status: History of cognitive impairments - at baseline       Memory: Decreased short-term memory  Pertinent Vitals/ Pain       Pain Assessment: No/denies pain         Frequency Min 2X/week     Progress Toward Goals  OT Goals(current goals can now be found in the care plan section)  Progress towards OT goals: Not progressing toward goals - comment  Acute Rehab OT Goals Patient Stated Goal: to be independent OT Goal Formulation: With patient Time For Goal Achievement: 04/21/14 Potential to  Achieve Goals: Good  Plan Discharge plan remains appropriate       End of Session Equipment Utilized During Treatment: Gait belt;Rolling walker   Activity Tolerance Patient tolerated treatment well   Patient Left in chair;with call bell/phone within reach;with chair alarm set;with family/visitor present   Nurse Communication      Functional Assessment Tool Used: clinical judgement Functional Limitation: Self care Self Care Current Status [phone]([phone]): At least 80 percent but less than 100 percent impaired, limited or restricted Self Care Goal Status ([id number](G8988): At least 20 percent but less than 40 percent impaired, limited or restricted   Time: 1105-1144 OT Time Calculation (min): 39 min  Charges: OT G-codes **NOT FOR INPATIENT CLASS** Functional Assessment Tool Used: clinical judgement Functional Limitation: Self care Self Care Current Status ([id number](G8987): At least 80 percent but less than 100 percent impaired, limited or restricted Self Care Goal Status ([id number](G8988): At least 20 percent but less than 40 percent impaired, limited or restricted OT General Charges $OT Visit: 1 Procedure OT Treatments $Self Care/Home Management : 23-37 mins $Therapeutic Activity: 8-22 mins  Rae LipsMiller, Akyia Borelli M 04/08/2014, 1:28 PM   Carney LivingLeeAnn Marie Aragorn Recker, OTR/L Occupational Therapist [phone] (pager)

## 2014-04-08 NOTE — Progress Notes (Signed)
Patient Name: [name] Luna Date of Encounter: 04/08/2014  Principal Problem:   Unstable angina Active Problems:   Essential hypertension   CAD (coronary artery disease)   H/O cardiac pacemaker, Medtronic versa implanted 01/2008, though original implanted 2000 for syncope and heart block and ventricular asystole.   Paroxysmal a-fib, history of, off coumadin since 10/2010, without recurrence of afib.   Hyperlipidemia   Hematoma of iliopsoas muscle, 05/06/13, spontaneous bleed, seen on CT scan   H/O orthostatic hypotension   Primary Cardiologist: Dr. Royann Luna  Patient Profile: 81 yo female w/ hx CAD, PAF, PPM, HLD, HTN, orthostatic hypotension, EF 55% w/ grade 2 dd, admitted 01/04 with chest pain. Cath w/ non-obs dz, ez neg MI. UTI Cx + K pneumo, sensitivities pending  SUBJECTIVE: Woke with chest pain and nausea again today. Minimal improvement with SL NTG, BP dropped to 80s, PO intake poor yesterday.  OBJECTIVE Filed Vitals:   04/08/14 0406 04/08/14 0730 04/08/14 0746 04/08/14 0753  BP: 147/49 182/67 107/41 81/41  Pulse: 73 69 65 70  Temp: 97.7 F (36.5 C) 97.9 F (36.6 C)    TempSrc: Oral Oral    Resp: 19 18    Height:      Weight: 135 lb 5.8 oz (61.4 kg)     SpO2: 100% 97% 92% 91%    Intake/Output Summary (Last 24 hours) at 04/08/14 0805 Last data filed at 04/07/14 1700  Gross per 24 hour  Intake    640 ml  Output    200 ml  Net    440 ml   Filed Weights   04/06/14 0416 04/07/14 0416 04/08/14 0406  Weight: 132 lb 0.9 oz (59.9 kg) 136 lb (61.689 kg) 135 lb 5.8 oz (61.4 kg)    PHYSICAL EXAM General: Well developed, well nourished, female in no acute distress. Head: Normocephalic, atraumatic.  Neck: Supple without bruits, JVD. Lungs:  Resp regular and unlabored, few rales bases. Heart: RRR, S1, S2, no S3, S4, 2/6  murmur; no rub. Abdomen: Soft, diffusely tender, non-distended, BS + x 4. No guarding. Extremities: No clubbing, cyanosis, no edema.  Neuro:  Alert and oriented X 3. Moves all extremities spontaneously. Spontaneous tremor Psych: Normal affect.  LABS: CBC: Recent Labs  04/05/14 0840 04/06/14 0136  WBC 5.2 5.4  HGB 11.3* 11.8*  HCT 35.4* 37.2  MCV 89.2 89.9  PLT 167 168   INR: Recent Labs  04/05/14 1531  INR 0.88   Basic Metabolic Panel: Recent Labs  04/05/14 0840 04/05/14 1531 04/06/14 0136  NA 140  --  137  K 3.8  --  3.5  CL 106  --  104  CO2 28  --  28  GLUCOSE 97  --  101*  BUN 8  --  10  CREATININE 0.77  --  0.78  CALCIUM 8.6  --  8.6  MG  --  2.0  --    Liver Function Tests: Recent Labs  04/05/14 1531  AST 36  ALT 27  ALKPHOS 122*  BILITOT 0.6  PROT 6.1  ALBUMIN 3.4*   Cardiac Enzymes: Recent Labs  04/05/14 1531 04/05/14 1956 04/06/14 0136  TROPONINI <0.03 <0.03 <0.03    Recent Labs  04/05/14 0842  TROPIPOC 0.01   Fasting Lipid Panel: Recent Labs  04/06/14 0136  CHOL 144  HDL 58  LDLCALC 75  TRIG 56  CHOLHDL 2.5   Thyroid Function Tests: Recent Labs  04/05/14 1531  TSH 0.684   TELE:  A pacing or AV pacing. P waves are small and not always visible.  Radiology/Studies: No results found.   Current Medications:  . amiodarone  200 mg Oral Daily  . aspirin EC  81 mg Oral Daily  . bisacodyl  5 mg Oral BID  . levothyroxine  100 mcg Oral QAC breakfast  . loratadine  10 mg Oral Daily  . metoprolol  25 mg Oral BID  . pantoprazole  40 mg Oral BID  . ranolazine  500 mg Oral Daily  . simvastatin  20 mg Oral QHS  . traMADol  25 mg Oral QHS  . trimethoprim  100 mg Oral QHS      ASSESSMENT AND PLAN: Principal Problem: Chest pain  - s/p cath 01/05 showing non-obs CAD, Continue medical Rx, chest pain is occuring at rest and while supine, Chest was tender to touch today and yesterday  This is at least some of problem  On tramadol 1x per day    Active Problems:   Essential hypertension - BP is labile Was a little low this am  Got fluids  She is off of midodrine  since in bed  Will need to resume at d/c      CAD (coronary artery disease) - see above    H/O cardiac pacemaker, Medtronic versa implanted 01/2008, though original implanted 2000 for syncope and heart block and ventricular asystole.    Paroxysmal a-fib, history of, off coumadin since 10/2010, without recurrence of afib. - Not a coumadin candidate No documented recurrence of afib.    Hyperlipidemia - on statin, profile OK    Hematoma of iliopsoas muscle, 05/06/13, spontaneous bleed, seen on CT scan - H&H stable    H/O orthostatic hypotension -  Is on midodrine 1x per day as outpaitnet. Rx by M Croituru Hx of syncope. Stopped here because she has been supine. Consider restart at 5 mgdaily. WIll need to be followed closely with HTN. Hypotensive after SL NTG this am, but is also dry, PO intake poor yesterday. Hydrate gently.   UTI- Urine cult is positive Klebsiella pneumoniae, sensitivities faxed over. Will start Cipro.   Signed, [name] Luna , PA-C 8:05 AM 04/08/2014   Patinet seen and examined  I have amended notee above to reflect my findings.  She continues to have intermitt CP as well as back pain, leg pain.  May all be musculoskel Son cares for patinet  ? If he is able to take care of her  She needs signif amount of help and has multiple complaints  WIl lhave SW evaluate  Will alos ask PT to see again.  Dietrich PatesPaula [name]

## 2014-04-08 NOTE — Discharge Summary (Signed)
CARDIOLOGY DISCHARGE SUMMARY   Patient ID: [name] Luna MRN: [id number] DOB/AGE: [birth year] 81 y.o.  Admit date: 04/05/2014 Discharge date: 04/08/2014  PCP: Evette Georges, MD Primary Cardiologist: Dr. Royann Shivers  Primary Discharge Diagnosis: Unstable angina - medical therapy for CAD  Secondary Discharge Diagnosis:    Precordial chest pain - Tylenol PRN   Essential hypertension   CAD (coronary artery disease)   H/O cardiac pacemaker, Medtronic versa implanted 01/2008, though original implanted 2000 for syncope and heart block and ventricular asystole.   Paroxysmal a-fib, history of, off coumadin since 10/2010, without recurrence of afib.   Hyperlipidemia   Hematoma of iliopsoas muscle, 05/06/13, spontaneous bleed, seen on CT scan   H/O orthostatic hypotension  Procedures: Right Heart Cath, Left Heart Cath, Selective Coronary Angiography, 2-D echocardiogram  Hospital Course: [name] Luna is a 81 y.o. female with a history of CAD, PAF, PPM, HLD, HTN, orthostatic hypotension, EF 55% w/ grade 2 dd. She is not anticoagulated due to increased bleeding risk. She had a spontaneous iliopsoas hematoma in 2015 while on aspirin/Plavix, so his only on aspirin 81 mg anticoagulation.  She woke at 3 AM with substernal chest pressure and shortness of breath. The symptoms persisted for several hours before she told her son and was brought to the emergency room. Sublingual nitroglycerin 3 Easter pain. She had recurrent pain and was treated with IV morphine. She was admitted for further evaluation and treatment.  Her cardiac enzymes were negative for MI. Other labs showed no critical abnormalities, except for an abnormal urinalysis. A culture was sent. Her chest x-ray showed no acute disease. A TSH was checked and was within normal limits so no dose change was made in her Synthroid.  She had an echocardiogram, results below under EF was 50-55 percent. She had recurrent chest pain, and it was  felt that she needed further evaluation. Cardiac catheterization was indicated to further define her anatomy. This was performed on 04/07/2014.   Because of the shortness of breath, she had a right and a left heart catheterization. Cardiac catheterization results are below. The right heart catheterization showed normal pressures. The first diagonal had moderate disease, other vessels had minor disease and medical therapy was recommended. She tolerated the procedure well.  She felt she might have a urinary tract infection, and a urinalysis was sent with a culture. The culture and sensitivities were reviewed. She had a Klebsiella pneumoniae urinary tract infection and is currently undergoing treatment, to complete antibiotics as an outpatient.  She was having problems with ambulation and her son was concerned because he was having problems caring for her. She was seen by therapy and skilled nursing facility placement with supervision/assistance 24/7 was recommended. Skilled nursing facility placement was pursued.  On 01/07, she was seen by Dr. Tenny Craw and all data were reviewed. A Child psychotherapist worked with the family to determine placement and she selected a bed at [facility].   She continued to have episodic chest pain, but her symptoms were more consistent with musculoskeletal pain, and she was treated successfully with Tylenol.  No further inpatient workup was indicated and she is considered stable for discharge, to follow up as an outpatient.  Labs:   Lab Results  Component Value Date   WBC 5.4 04/06/2014   HGB 11.8* 04/06/2014   HCT 37.2 04/06/2014   MCV 89.9 04/06/2014   PLT 168 04/06/2014     Recent Labs Lab 04/05/14 1531 04/06/14 0136  NA  --  137  K  --  3.5  CL  --  104  CO2  --  28  BUN  --  10  CREATININE  --  0.78  CALCIUM  --  8.6  PROT 6.1  --   BILITOT 0.6  --   ALKPHOS 122*  --   ALT 27  --   AST 36  --   GLUCOSE  --  101*    Recent Labs   04/05/14 1956 04/06/14 0136  TROPONINI <0.03 <0.03   Lipid Panel     Component Value Date/Time   CHOL 144 04/06/2014 0136   TRIG 56 04/06/2014 0136   HDL 58 04/06/2014 0136   CHOLHDL 2.5 04/06/2014 0136   VLDL 11 04/06/2014 0136   LDLCALC 75 04/06/2014 0136   Lab Results  Component Value Date   TSH 0.684 04/05/2014   Urinalysis    Component Value Date/Time   COLORURINE YELLOW 04/06/2014 0520   APPEARANCEUR CLOUDY* 04/06/2014 0520   LABSPEC 1.011 04/06/2014 0520   PHURINE 7.5 04/06/2014 0520   GLUCOSEU NEGATIVE 04/06/2014 0520   HGBUR NEGATIVE 04/06/2014 0520   BILIRUBINUR NEGATIVE 04/06/2014 0520   BILIRUBINUR neg 10/10/2010 1433   KETONESUR NEGATIVE 04/06/2014 0520   PROTEINUR NEGATIVE 04/06/2014 0520   PROTEINUR neg 10/10/2010 1433   UROBILINOGEN 1.0 04/06/2014 0520   UROBILINOGEN 0.2 10/10/2010 1433   NITRITE POSITIVE* 04/06/2014 0520   NITRITE pos 10/10/2010 1433   LEUKOCYTESUR NEGATIVE 04/06/2014 0520       Radiology: Dg Chest 2 View 04/05/2014   CLINICAL DATA:  Chest pain and shortness of breath today.  EXAM: CHEST  2 VIEW  COMPARISON:  10/20/2013  FINDINGS: Right AICD leads overlie the right atrium and right ventricle. The heart is enlarged. There is minimal right basilar atelectasis. No focal consolidations or pleural effusions. No pulmonary edema. Left upper quadrant calcification likely represents a splenic artery aneurysm and appears stable over multiple prior studies measuring less than 2 cm in diameter.  IMPRESSION: 1. Cardiomegaly. 2.  No evidence for acute cardiopulmonary abnormality.   Electronically Signed   By: Rosalie Gums M.D.   On: 04/05/2014 09:32    Cardiac Cath: 04/06/2014 Procedural Findings: Hemodynamics RA 4/4 mean 2 mm Hg RV 43/5 mm Hg PA 43/14 mean 27 mm Hg PCWP 11/24 mean 12 mm Hg LV 198/15 mm Hg AO 196/73 mean 121 mm Hg Oxygen saturations: PA 69% AO 98% Cardiac Output (Fick) 4.7 L/min  Cardiac Index (Fick) 2.86 L/min/meter  squared. Coronary angiography: Coronary dominance: right Left mainstem: Normal.  Left anterior descending (LAD): The proximal LAD is widely patent at the prior stent sites. The first diagonal has 50-60% ostial stenosis. Left circumflex (LCx): No obstructive disease. Right coronary artery (RCA): Large dominant vessel with mild disease up to 20%.  Left ventriculography: not done  Final Conclusions:  1. Nonobstructive CAD 2. Mild pulmonary HTN with normal LV filling pressures.  Recommendations: medical management.  EKG: 04/06/2014 AV pacing, rate 72  Echo: 04/07/2014 Conclusions - Left ventricle: The cavity size was normal. Wall thickness was increased in a pattern of mild LVH. Systolic function was normal. The estimated ejection fraction was in the range of 55% to 60%. Wall motion was normal; there were no regional wall motion abnormalities. Features are consistent with a pseudonormal left ventricular filling pattern, with concomitant abnormal relaxation and increased filling pressure (grade 2 diastolic dysfunction). - Mitral valve: Calcified annulus. - Left atrium: The atrium was severely dilated. - Right ventricle: The  cavity size was mildly dilated. Wall thickness was normal. - Right atrium: The atrium was moderately to severely dilated. - Atrial septum: No defect or patent foramen ovale was identified. - Tricuspid valve: There was mild-moderate regurgitation directed centrally. - Pulmonary arteries: Systolic pressure was mildly increased. PA peak pressure: 38 mm Hg (S).  FOLLOW UP PLANS AND APPOINTMENTS Allergies  Allergen Reactions  . Codeine Other (See Comments)    Syncope   . Imdur [Isosorbide] Other (See Comments)    syncope  . Isosorbide Mononitrate Other (See Comments)    fainted  . Lorazepam Other (See Comments)    hallucinations  . Prednisone Nausea And Vomiting  . Penicillins Rash  . Valium [Diazepam] Other (See Comments)     Reaction unknown     Medication List    TAKE these medications        amiodarone 200 MG tablet  Commonly known as:  PACERONE  TAKE ONE TABLET BY MOUTH ONCE DAILY     aspirin 81 MG EC tablet  Take 81 mg by mouth daily.     bisacodyl 5 MG EC tablet  Commonly known as:  DULCOLAX  Take 5 mg by mouth 2 (two) times daily.     cephALEXin 500 MG capsule  Commonly known as:  KEFLEX  Take 1 capsule (500 mg total) by mouth every 12 (twelve) hours.     levothyroxine 100 MCG tablet  Commonly known as:  SYNTHROID, LEVOTHROID  TAKE ONE TABLET BY MOUTH ONCE DAILY BEFORE BREAKFAST     loratadine 10 MG tablet  Commonly known as:  CLARITIN  Take 10 mg by mouth daily.     losartan 50 MG tablet  Commonly known as:  COZAAR  Take 1 tablet (50 mg total) by mouth 2 (two) times daily.     metoprolol tartrate 25 MG tablet  Commonly known as:  LOPRESSOR  Take 2 tablets (50 mg total) by mouth 2 (two) times daily.     midodrine 2.5 MG tablet  Commonly known as:  PROAMATINE  Take 1 tablet (2.5 mg total) by mouth daily before breakfast.     nitroGLYCERIN 0.4 MG SL tablet  Commonly known as:  NITROSTAT  Place 0.4 mg under the tongue every 5 (five) minutes as needed for chest pain.     pantoprazole 40 MG tablet  Commonly known as:  PROTONIX  Take 1 tablet (40 mg total) by mouth 2 (two) times daily.     ranolazine 500 MG 12 hr tablet  Commonly known as:  RANEXA  Take 1 tablet (500 mg total) by mouth daily.     simvastatin 20 MG tablet  Commonly known as:  ZOCOR  Take 1 tablet (20 mg total) by mouth at bedtime.     traMADol 50 MG tablet  Commonly known as:  ULTRAM  Take 0.5 tablets (25 mg total) by mouth at bedtime.     trimethoprim 100 MG tablet  Commonly known as:  TRIMPEX  Take 1 tablet (100 mg total) by mouth at bedtime. Do not take while on Keflex (cefazolin).        Discharge Instructions    Diet - low sodium heart healthy    Complete by:  As directed      Increase activity  slowly    Complete by:  As directed           Follow-up Information    Follow up with Thurmon FairROITORU,MIHAI, MD.   Specialty:  Cardiology   Why:  The office will call.   Contact information:   [address] [place] [address] [phone]       BRING ALL MEDICATIONS WITH YOU TO FOLLOW UP APPOINTMENTS  Time spent with patient to include physician time:  48 min Signed: Theodore Demark, PA-C 04/08/2014, 4:18 PM Co-Sign MD

## 2014-04-12 ENCOUNTER — Encounter: Payer: Self-pay | Admitting: Adult Health

## 2014-04-12 ENCOUNTER — Non-Acute Institutional Stay (SKILLED_NURSING_FACILITY): Payer: Medicare Other | Admitting: Adult Health

## 2014-04-12 DIAGNOSIS — I2510 Atherosclerotic heart disease of native coronary artery without angina pectoris: Secondary | ICD-10-CM

## 2014-04-12 DIAGNOSIS — M25552 Pain in left hip: Secondary | ICD-10-CM

## 2014-04-12 DIAGNOSIS — E785 Hyperlipidemia, unspecified: Secondary | ICD-10-CM

## 2014-04-12 DIAGNOSIS — I10 Essential (primary) hypertension: Secondary | ICD-10-CM

## 2014-04-12 DIAGNOSIS — I480 Paroxysmal atrial fibrillation: Secondary | ICD-10-CM

## 2014-04-12 DIAGNOSIS — E039 Hypothyroidism, unspecified: Secondary | ICD-10-CM

## 2014-04-12 DIAGNOSIS — I200 Unstable angina: Secondary | ICD-10-CM

## 2014-04-12 DIAGNOSIS — N390 Urinary tract infection, site not specified: Secondary | ICD-10-CM

## 2014-04-12 DIAGNOSIS — K229 Disease of esophagus, unspecified: Secondary | ICD-10-CM

## 2014-04-12 DIAGNOSIS — K5909 Other constipation: Secondary | ICD-10-CM

## 2014-04-12 NOTE — Progress Notes (Signed)
Patient ID: [name] Luna, female   DOB: 1921-03-09, 81 y.o.   MRN: [id number]  [facility]     Allergies  Allergen Reactions  . Codeine Other (See Comments)    Syncope   . Imdur [Isosorbide] Other (See Comments)    syncope  . Isosorbide Mononitrate Other (See Comments)    fainted  . Lorazepam Other (See Comments)    hallucinations  . Prednisone Nausea And Vomiting  . Penicillins Rash  . Valium [Diazepam] Other (See Comments)    Reaction unknown       Chief Complaint  Patient presents with  . Hospitalization Follow-up    HPI:  She has been hospitalized for precordial chest pain which is being treated with prn tylenol. She is being treated for an uti. She had a cardiac cath done which showed cad; for which she will be treated medically. She is here for short term rehab. Her goal is to return back home.    Past Medical History  Diagnosis Date  . CHF (congestive heart failure)   . GERD (gastroesophageal reflux disease)   . Hypothyroidism   . Complete heart block     a. Bradycardia/CHB s/p Medtronic pacemaker 2009.  [name] Pancreatitis   . Coronary artery disease     a. DES to LAD 2008, 2011, 2012 (no disease in LM or RCA, mild luminal irreg Cx). b. Normal nuc 08/2011: no ischemia, EF 80%.  . Shingles 1980's    "twice; once on my front side; once on my back" (02/07/2012)  . Pacemaker   . PAF (paroxysmal atrial fibrillation)   . Tremor, essential   . S/P dilatation of esophageal stricture   . H/O hiatal hernia   . Neuromuscular disorder     essential tremors  . UTI (lower urinary tract infection) 08/03/2011  . Dizziness, near syncope   . Macular degeneration, dry     "both eyes" (02/07/2012)  . Hypertension   . Pneumonia 1927  . Syncope   . Orthostatic hypotension   . Iliopsoas muscle hematoma     a. Spontaneously 05/2013 while on aspirin/Plavix. Not a candidate for anticoagulation other than aspirin due to this.  . Hyperlipidemia   . Memory loss   . H/O valvular  heart disease     a. 2D echo 2013: EF 55-60%, grade 2DD, mild MR, mild AI, mild-mod TR, PASP .    Past Surgical History  Procedure Laterality Date  . Repair spigelian hernia    . S/p childbirth      x2  . Cataract extraction w/ intraocular lens  implant, bilateral  ~ 1997  . Vaginal hysterectomy  1970's  . Left oophorectomy  1970's  . Insert / replace / remove pacemaker  2000; 2009    initial placement; replaced  . Appendectomy  1933  . Cholecystectomy  1950's  . Hernia repair    . Esophageal dilation      "once" (02/07/2012)  . Cardiac catheterization  12/24/2005    No intervention - recommend medical therapy  . Cardiac catheterization  10/13/2006    80% proximal LAD stenosis, stented w/ a 3.5x91mm Cypher stent at 14atm (3.4mm), resulting in reduction of 80% to 0% residual.  . Cardiac catheterization  11/14/2006    No intervention - recommend medical therapy  . Cardiac catheterization  07/24/2007    No intervention.  . Cardiac catheterization  01/16/2010    75% proximal LAD stenosis w/ in-stent restenosis. No intervention. Dr Allyson Sabal will intervene on proximal LAD  lesion.  . Cardiac catheterization  01/16/2010    75% proximal LAD stenosis, stented w/ a 3x20mm Promus DES at 16atm, resulting in reduction 75% to 0% residual  . Cardioversion  07/11/2010    Successful conversion to V paced verified via pacemaker.  . Cardiac catheterization  09/08/2010    LAD 75% in-stent restenosis, stented w/ a 3x62mm Resolute DES at 16atm (3.35mm), resulting in reduction of 75% stenosis  . Renal doppler  12/12/2010    Bilat proximal renal 1-59% diameter reduction. Bilat kidneys are normal.  . Cardiac catheterization  12/22/2010    No intervention - recommend medical therapy.  . Cardiovascular stress test  08/09/2011    No scintigraphic evidence of inducible myocardial ischemia. Non-diagnostic for ischemia.  . Transthoracic echocardiogram  01/21/2012    EF 55-60%, mild-moderate tricuspid regurg.  [name]  Percutaneous coronary stent intervention (pci-s)  04/06/2014    Procedure: PERCUTANEOUS CORONARY STENT INTERVENTION (PCI-S);  Surgeon: Peter M Swaziland, MD;  Location: [facility] CATH LAB;  Service: Cardiovascular;;  . Cardiac catheterization  04/06/2014    Procedure: LEFT HEART CATH AND CORONARY ANGIOGRAPHY;  Surgeon: Peter M Swaziland, MD;  Location: [facility] CATH LAB;  Service: Cardiovascular;;    VITAL SIGNS BP 138/72 mmHg  Pulse 98  Ht  (1.626 m)  Wt 133 lb (60.328 kg)  BMI 22.82 kg/m2   Outpatient Encounter Prescriptions as of 04/12/2014  Medication Sig  . amiodarone (PACERONE) 200 MG tablet TAKE ONE TABLET BY MOUTH ONCE DAILY  . aspirin 81 MG EC tablet Take 81 mg by mouth daily.    . bisacodyl (DULCOLAX) 5 MG EC tablet Take 5 mg by mouth 2 (two) times daily.  . cephALEXin (KEFLEX) 500 MG capsule Take 1 capsule (500 mg total) by mouth every 12 (twelve) hours.  [name] levothyroxine (SYNTHROID, LEVOTHROID) 100 MCG tablet TAKE ONE TABLET BY MOUTH ONCE DAILY BEFORE BREAKFAST  . loratadine (CLARITIN) 10 MG tablet Take 10 mg by mouth daily.    [name] losartan (COZAAR) 50 MG tablet Take 1 tablet (50 mg total) by mouth 2 (two) times daily.  . metoprolol (LOPRESSOR) 25 MG tablet Take 2 tablets (50 mg total) by mouth 2 (two) times daily.  . midodrine (PROAMATINE) 2.5 MG tablet Take 1 tablet (2.5 mg total) by mouth daily before breakfast.  . nitroGLYCERIN (NITROSTAT) 0.4 MG SL tablet Place 0.4 mg under the tongue every 5 (five) minutes as needed for chest pain.   . pantoprazole (PROTONIX) 40 MG tablet Take 1 tablet (40 mg total) by mouth 2 (two) times daily.  . ranolazine (RANEXA) 500 MG 12 hr tablet Take 1 tablet (500 mg total) by mouth daily.  . simvastatin (ZOCOR) 20 MG tablet Take 1 tablet (20 mg total) by mouth at bedtime.  . traMADol (ULTRAM) 50 MG tablet Take 0.5 tablets (25 mg total) by mouth at bedtime.  [name] trimethoprim (TRIMPEX) 100 MG tablet Take 1 tablet (100 mg total) by mouth at bedtime. Do not take while  on Keflex (cefazolin).     SIGNIFICANT DIAGNOSTIC EXAMS  04-05-14: chest x-ray: 1. Cardiomegaly.2.  No evidence for acute cardiopulmonary abnormality.  04-07-14: 2-d echo: Left ventricle: The cavity size was normal. Wall thickness was increased in a pattern of mild LVH. Systolic function was normal. The estimated ejection fraction was in the range of 55% to 60%. Wall motion was normal; there were no regional wall motion abnormalities. Features are consistent with a pseudonormal left ventricular filling pattern, with concomitant abnormal relaxation and increased filling  pressure (grade 2 diastolic dysfunction). - Mitral valve: Calcified annulus. - Left atrium: The atrium was severely dilated. - Right ventricle: The cavity size was mildly dilated. Wall thickness was normal. - Right atrium: The atrium was moderately to severely dilated. - Atrial septum: No defect or patent foramen ovale was identified. - Tricuspid valve: There was mild-moderate regurgitation directed centrally. - Pulmonary arteries: Systolic pressure was mildly increased. PA peak pressure: 38 mm Hg (S).   LABS REVIEWED:   04-05-13: wbc 5.2; hgb 11.3; hct 35.4 ;mcv 89.2; plt 167; glucose 97; bun 8; creat 0.77; k+3.8; na++140; liver normal albumin 3.4; mag 2.0; tsh 0.684 04-06-14: chol 144; ldl 75; trig 56; hdl 58; urine culture: klebsiella pneumonia: keflex    Review of Systems  Constitutional: Negative for malaise/fatigue.  Respiratory: Negative for cough and shortness of breath.   Cardiovascular: Negative for chest pain, palpitations and leg swelling.  Gastrointestinal: Negative for heartburn, abdominal pain and constipation.  Musculoskeletal: Negative for myalgias and joint pain.  Skin: Negative.   Neurological: Negative for headaches.  Psychiatric/Behavioral: Negative for depression. The patient is not nervous/anxious.      Physical Exam  Constitutional: She is oriented to person, place, and time. No distress.  Frail     Neck: Neck supple. No JVD present. No thyromegaly present.  Cardiovascular: Normal rate, regular rhythm, normal heart sounds and intact distal pulses.   Respiratory: Effort normal and breath sounds normal. No respiratory distress. She has no wheezes.  GI: Soft. Bowel sounds are normal. She exhibits no distension. There is no tenderness.  Musculoskeletal: She exhibits no edema.  Is able to move all extremities   Lymphadenopathy:    She has no cervical adenopathy.  Neurological: She is alert and oriented to person, place, and time.  Skin: Skin is warm and dry. She is not diaphoretic.       ASSESSMENT/ PLAN:  1. Unstable angina: is presently stable without complaint of chest pain present. Will continue to monitor her status.   2. Afib: heart rate is stable will continue amiodarone 200 mg daily; lopressor 50 mg twice daily for rate control  asa 81 mg daily and will monitor her status.   3. Hypertension: is stable continue cozaar 50 mg twice daily lopressor 50 mg twice daily   5. CAD: is stable no complaint of chest pain present; will continue asa 81 mg daily ranexa 500 mg daily; ntg prn will monitor  6. Dysliplidemia: will continue zocor 20 mg daily; her ldl is 75  7. Gerd: will continue protonix 40 mg twice daily   8. Allergic rhinits: will continue claritin daily   9. Hypothyroidism: will continue synthroid 100 mcg daily tsh is 0.684  10. Constipation: will continue dulcolax 5 mg twice daily;   11. UTI: will complete keflex 500 mg twice daily for a total of 5 days post hospitalization; will then resume trimethoprim 100 mg daily long term therapy   12. Left hip pain: her pain is presently being managed with ultram 25 mg nightly   13. Orthostatic hypotension: will continue midodrine 2.5 mg daily    Synthia Innocenteborah Ritika Hellickson NP [facility]  Contact [phone] Monday through Friday 8am- 5pm  After hours call [phone]

## 2014-04-22 ENCOUNTER — Encounter: Payer: Self-pay | Admitting: Internal Medicine

## 2014-04-22 ENCOUNTER — Non-Acute Institutional Stay (SKILLED_NURSING_FACILITY): Payer: Medicare Other | Admitting: Internal Medicine

## 2014-04-22 DIAGNOSIS — I10 Essential (primary) hypertension: Secondary | ICD-10-CM

## 2014-04-22 DIAGNOSIS — I2510 Atherosclerotic heart disease of native coronary artery without angina pectoris: Secondary | ICD-10-CM

## 2014-04-22 DIAGNOSIS — I480 Paroxysmal atrial fibrillation: Secondary | ICD-10-CM

## 2014-04-22 DIAGNOSIS — M25552 Pain in left hip: Secondary | ICD-10-CM

## 2014-04-22 DIAGNOSIS — E785 Hyperlipidemia, unspecified: Secondary | ICD-10-CM

## 2014-04-22 DIAGNOSIS — E039 Hypothyroidism, unspecified: Secondary | ICD-10-CM

## 2014-04-22 DIAGNOSIS — I200 Unstable angina: Secondary | ICD-10-CM

## 2014-04-22 DIAGNOSIS — N390 Urinary tract infection, site not specified: Secondary | ICD-10-CM

## 2014-04-22 NOTE — Progress Notes (Signed)
Patient ID: Marcella Dubs, female   DOB: December 15, 1920, 81 y.o.   MRN: [id number]    HISTORY AND PHYSICAL  Location:    [facility] [place]     Place of Service:    SNF  Extended Emergency Contact Information Primary Emergency Contact: [facility] Address: [address]          [place], [place] [address] Johnnette Litter of [place] Phone: [phone] Mobile Phone: [phone] Relation: Son  Advanced Directive information  FULL CODE  Chief Complaint  Patient presents with  . New Admit To SNF    [place], CAD, afib, HTN, hypothyroidism, left hip pain, UTI, orthostatic hypotension    HPI:  81 yo female seen today as a new admission to SNF. She is walking with PT to increase her exercise tolerance. No further CP and denies SOB. No N/V, f/c, HA or dizziness. No palpitations. No new LE swelling. She has chronic pain in her lower back and left hip. Her back throbs at night. She  takes 1/2 tab of tramadol qhs which helps. Her son is present and spoke to me separately about pt's condition. He is c/a her returning home if she is unable to get back to her baseline independent functional status. He is having a hard time caring for her. She lives with him and he is considering assisted living facility placement for her.  Pt has no other concerns. She is eating and sleeping well. No nursing issues  Past Medical History  Diagnosis Date  . CHF (congestive heart failure)   . GERD (gastroesophageal reflux disease)   . Hypothyroidism   . Complete heart block     a. Bradycardia/CHB s/p Medtronic pacemaker 2009.  [name] Pancreatitis   . Coronary artery disease     a. DES to LAD 2008, 2011, 2012 (no disease in LM or RCA, mild luminal irreg Cx). b. Normal nuc 08/2011: no ischemia, EF 80%.  . Shingles 1980's    "twice; once on my front side; once on my back" (02/07/2012)  . Pacemaker   . PAF (paroxysmal atrial fibrillation)   . Tremor, essential   . S/P dilatation of esophageal stricture   . H/O hiatal  hernia   . Neuromuscular disorder     essential tremors  . UTI (lower urinary tract infection) 08/03/2011  . Dizziness, near syncope   . Macular degeneration, dry     "both eyes" (02/07/2012)  . Hypertension   . Pneumonia 1927  . Syncope   . Orthostatic hypotension   . Iliopsoas muscle hematoma     a. Spontaneously 05/2013 while on aspirin/Plavix. Not a candidate for anticoagulation other than aspirin due to this.  . Hyperlipidemia   . Memory loss   . H/O valvular heart disease     a. 2D echo 2013: EF 55-60%, grade 2DD, mild MR, mild AI, mild-mod TR, PASP 56mHg.    Past Surgical History  Procedure Laterality Date  . Repair spigelian hernia    . S/p childbirth      x2  . Cataract extraction w/ intraocular lens  implant, bilateral  ~ 1997  . Vaginal hysterectomy  1970's  . Left oophorectomy  1970's  . Insert / replace / remove pacemaker  2000; 2009    initial placement; replaced  . Appendectomy  1933  . Cholecystectomy  1950's  . Hernia repair    . Esophageal dilation      "once" (02/07/2012)  . Cardiac catheterization  12/24/2005    No intervention -  recommend medical therapy  . Cardiac catheterization  10/13/2006    80% proximal LAD stenosis, stented w/ a 3.5x52m Cypher stent at 14atm (3.745m, resulting in reduction of 80% to 0% residual.  . Cardiac catheterization  11/14/2006    No intervention - recommend medical therapy  . Cardiac catheterization  07/24/2007    No intervention.  . Cardiac catheterization  01/16/2010    75% proximal LAD stenosis w/ in-stent restenosis. No intervention. Dr BeGwenlyn Foundill intervene on proximal LAD lesion.  . Cardiac catheterization  01/16/2010    75% proximal LAD stenosis, stented w/ a 3x1228mromus DES at 16atm, resulting in reduction 75% to 0% residual  . Cardioversion  07/11/2010    Successful conversion to V paced verified via pacemaker.  . Cardiac catheterization  09/08/2010    LAD 75% in-stent restenosis, stented w/ a 3x18m61msolute DES at  16atm (3.36mm91mesulting in reduction of 75% stenosis  . Renal doppler  12/12/2010    Bilat proximal renal 1-59% diameter reduction. Bilat kidneys are normal.  . Cardiac catheterization  12/22/2010    No intervention - recommend medical therapy.  . Cardiovascular stress test  08/09/2011    No scintigraphic evidence of inducible myocardial ischemia. Non-diagnostic for ischemia.  . Transthoracic echocardiogram  01/21/2012    EF 55-60%, mild-moderate tricuspid regurg.  . PerMarland Kitchenutaneous coronary stent intervention (pci-s)  04/06/2014    Procedure: PERCUTANEOUS CORONARY STENT INTERVENTION (PCI-S);  Surgeon: Peter M JordaMartinique  [facility];  Service: Cardiovascular;;  . Cardiac catheterization  04/06/2014    Procedure: LEFT HEART CATH AND CORONARY ANGIOGRAPHY;  Surgeon: Peter M JordaMartinique  [facility];  Service: Cardiovascular;;    Patient Care Team: JeffrDorena Cookeyas PCP - General  History   Social History  . Marital Status: Widowed    Spouse Name: N/A    Number of Children: N/A  . Years of Education: N/A   Occupational History  . Not on file.   Social History Main Topics  . Smoking status: Never Smoker   . Smokeless tobacco: Never Used  . Alcohol Use: No  . Drug Use: No  . Sexual Activity: No   Other Topics Concern  . Not on file   Social History Narrative     reports that she has never smoked. She has never used smokeless tobacco. She reports that she does not drink alcohol or use illicit drugs.  Family History  Problem Relation Age of Onset  . Sudden death Mother    Family Status  Relation Status Death Age  . Mother Deceased   . Father Deceased     Immunization History  Administered Date(s) Administered  . Influenza Split 12/21/2013  . Influenza Whole 01/31/2005, 02/06/2007, 12/24/2011  . Influenza-Unspecified 11/29/2012  . Pneumococcal Polysaccharide-23 04/02/2000, 11/08/2011  . Td 04/02/1996, 10/10/2009    Allergies  Allergen Reactions    . Codeine Other (See Comments)    Syncope   . Imdur [Isosorbide] Other (See Comments)    syncope  . Isosorbide Mononitrate Other (See Comments)    fainted  . Lorazepam Other (See Comments)    hallucinations  . Prednisone Nausea And Vomiting  . Penicillins Rash  . Valium [Diazepam] Other (See Comments)    Reaction unknown   **Past medical, surgical, family and social history reviewed  Medications: Patient's Medications  New Prescriptions   No medications on file  Previous Medications   AMIODARONE (PACERONE) 200 MG TABLET    TAKE ONE  TABLET BY MOUTH ONCE DAILY   ASPIRIN 81 MG EC TABLET    Take 81 mg by mouth daily.     BISACODYL (DULCOLAX) 5 MG EC TABLET    Take 5 mg by mouth 2 (two) times daily.   CEPHALEXIN (KEFLEX) 500 MG CAPSULE    Take 1 capsule (500 mg total) by mouth every 12 (twelve) hours.   LEVOTHYROXINE (SYNTHROID, LEVOTHROID) 100 MCG TABLET    TAKE ONE TABLET BY MOUTH ONCE DAILY BEFORE BREAKFAST   LORATADINE (CLARITIN) 10 MG TABLET    Take 10 mg by mouth daily.     LOSARTAN (COZAAR) 50 MG TABLET    Take 1 tablet (50 mg total) by mouth 2 (two) times daily.   METOPROLOL (LOPRESSOR) 25 MG TABLET    Take 2 tablets (50 mg total) by mouth 2 (two) times daily.   MIDODRINE (PROAMATINE) 2.5 MG TABLET    Take 1 tablet (2.5 mg total) by mouth daily before breakfast.   NITROGLYCERIN (NITROSTAT) 0.4 MG SL TABLET    Place 0.4 mg under the tongue every 5 (five) minutes as needed for chest pain.    PANTOPRAZOLE (PROTONIX) 40 MG TABLET    Take 1 tablet (40 mg total) by mouth 2 (two) times daily.   RANOLAZINE (RANEXA) 500 MG 12 HR TABLET    Take 1 tablet (500 mg total) by mouth daily.   SIMVASTATIN (ZOCOR) 20 MG TABLET    Take 1 tablet (20 mg total) by mouth at bedtime.   TRAMADOL (ULTRAM) 50 MG TABLET    Take 0.5 tablets (25 mg total) by mouth at bedtime.   TRIMETHOPRIM (TRIMPEX) 100 MG TABLET    Take 1 tablet (100 mg total) by mouth at bedtime. Do not take while on Keflex (cefazolin).   Modified Medications   No medications on file  Discontinued Medications   No medications on file    Review of Systems   As above. All other systems reviewed are negative  Filed Vitals:   04/22/14 1106  BP: 127/62  Pulse: 77  Temp: 96.6 F (35.9 C)  Weight: 130 lb (58.968 kg)   Body mass index is 22.3 kg/(m^2).  Physical Exam  CONSTITUTIONAL: Looks frail in NAD. Awake, alert and oriented x 3. Son present HEENT: PERRLA. Oropharynx clear and without exudate. MMM NECK: Supple. Nontender. No palpable cervical or supraclavicular lymph nodes. No carotid bruit b/l. No thyromegaly or thyroid mass palpable.  CVS: Regular rate without murmur, gallop or rub. LUNGS: CTA b/l no wheezing, rales or rhonchi. ABDOMEN: Bowel sounds present x 4. Soft, nontender, nondistended. No palpable mass or bruit EXTREMITIES: Trace LE edema b/l. Distal pulses palpable. No calf tenderness. TED stockings intact b/l PSYCH: Affect, behavior and mood normal   Labs reviewed: Admission on 04/05/2014, Discharged on 04/08/2014  Component Date Value Ref Range Status  . Sodium 04/05/2014 140  135 - 145 mmol/L Final   Please note change in reference range.  . Potassium 04/05/2014 3.8  3.5 - 5.1 mmol/L Final   Please note change in reference range.  . Chloride 04/05/2014 106  96 - 112 mEq/L Final  . CO2 04/05/2014 28  19 - 32 mmol/L Final  . Glucose, Bld 04/05/2014 97  70 - 99 mg/dL Final  . BUN 04/05/2014 8  6 - 23 mg/dL Final  . Creatinine, Ser 04/05/2014 0.77  0.50 - 1.10 mg/dL Final  . Calcium 04/05/2014 8.6  8.4 - 10.5 mg/dL Final  . GFR calc non Af Wyvonnia Lora  04/05/2014 71* >90 mL/min Final  . GFR calc Af Amer 04/05/2014 83* >90 mL/min Final   Comment: (NOTE) The eGFR has been calculated using the CKD EPI equation. This calculation has not been validated in all clinical situations. eGFR's persistently <90 mL/min signify possible Chronic Kidney Disease.   . Anion gap 04/05/2014 6  5 - 15 Final  . WBC  04/05/2014 5.2  4.0 - 10.5 K/uL Final  . RBC 04/05/2014 3.97  3.87 - 5.11 MIL/uL Final  . Hemoglobin 04/05/2014 11.3* 12.0 - 15.0 g/dL Final  . HCT 04/05/2014 35.4* 36.0 - 46.0 % Final  . MCV 04/05/2014 89.2  78.0 - 100.0 fL Final  . MCH 04/05/2014 28.5  26.0 - 34.0 pg Final  . MCHC 04/05/2014 31.9  30.0 - 36.0 g/dL Final  . RDW 04/05/2014 15.4  11.5 - 15.5 % Final  . Platelets 04/05/2014 167  150 - 400 K/uL Final  . Troponin i, poc 04/05/2014 0.01  0.00 - 0.08 ng/mL Final  . Comment 3 04/05/2014          Final   Comment: Due to the release kinetics of cTnI, a negative result within the first hours of the onset of symptoms does not rule out myocardial infarction with certainty. If myocardial infarction is still suspected, repeat the test at appropriate intervals.   [name] TSH 04/05/2014 0.684  0.350 - 4.500 uIU/mL Final  . Magnesium 04/05/2014 2.0  1.5 - 2.5 mg/dL Final  . Troponin I 04/05/2014 <0.03  <0.031 ng/mL Final   Comment:        NO INDICATION OF MYOCARDIAL INJURY. Please note change in reference range.   . Troponin I 04/05/2014 <0.03  <0.031 ng/mL Final   Comment:        NO INDICATION OF MYOCARDIAL INJURY. Please note change in reference range.   . Troponin I 04/06/2014 <0.03  <0.031 ng/mL Final   Comment:        NO INDICATION OF MYOCARDIAL INJURY. Please note change in reference range.   . Prothrombin Time 04/05/2014 12.0  11.6 - 15.2 seconds Final  . INR 04/05/2014 0.88  0.00 - 1.49 Final  . Total Protein 04/05/2014 6.1  6.0 - 8.3 g/dL Final  . Albumin 04/05/2014 3.4* 3.5 - 5.2 g/dL Final  . AST 04/05/2014 36  0 - 37 U/L Final  . ALT 04/05/2014 27  0 - 35 U/L Final  . Alkaline Phosphatase 04/05/2014 122* 39 - 117 U/L Final  . Total Bilirubin 04/05/2014 0.6  0.3 - 1.2 mg/dL Final  . Bilirubin, Direct 04/05/2014 0.2  0.0 - 0.3 mg/dL Final  . Indirect Bilirubin 04/05/2014 0.4  0.3 - 0.9 mg/dL Final  . MRSA by PCR 04/05/2014 NEGATIVE  NEGATIVE Final   Comment:         The GeneXpert MRSA Assay (FDA approved for NASAL specimens only), is one component of a comprehensive MRSA colonization surveillance program. It is not intended to diagnose MRSA infection nor to guide or monitor treatment for MRSA infections.   . WBC 04/06/2014 5.4  4.0 - 10.5 K/uL Final  . RBC 04/06/2014 4.14  3.87 - 5.11 MIL/uL Final  . Hemoglobin 04/06/2014 11.8* 12.0 - 15.0 g/dL Final  . HCT 04/06/2014 37.2  36.0 - 46.0 % Final  . MCV 04/06/2014 89.9  78.0 - 100.0 fL Final  . MCH 04/06/2014 28.5  26.0 - 34.0 pg Final  . MCHC 04/06/2014 31.7  30.0 - 36.0 g/dL Final  . RDW 04/06/2014  15.6* 11.5 - 15.5 % Final  . Platelets 04/06/2014 168  150 - 400 K/uL Final  . Sodium 04/06/2014 137  135 - 145 mmol/L Final   Please note change in reference range.  . Potassium 04/06/2014 3.5  3.5 - 5.1 mmol/L Final   Please note change in reference range.  . Chloride 04/06/2014 104  96 - 112 mEq/L Final  . CO2 04/06/2014 28  19 - 32 mmol/L Final  . Glucose, Bld 04/06/2014 101* 70 - 99 mg/dL Final  . BUN 04/06/2014 10  6 - 23 mg/dL Final  . Creatinine, Ser 04/06/2014 0.78  0.50 - 1.10 mg/dL Final  . Calcium 04/06/2014 8.6  8.4 - 10.5 mg/dL Final  . GFR calc non Af Amer 04/06/2014 71* >90 mL/min Final  . GFR calc Af Amer 04/06/2014 82* >90 mL/min Final   Comment: (NOTE) The eGFR has been calculated using the CKD EPI equation. This calculation has not been validated in all clinical situations. eGFR's persistently <90 mL/min signify possible Chronic Kidney Disease.   . Anion gap 04/06/2014 5  5 - 15 Final  . Cholesterol 04/06/2014 144  0 - 200 mg/dL Final  . Triglycerides 04/06/2014 56  <150 mg/dL Final  . HDL 04/06/2014 58  >39 mg/dL Final  . Total CHOL/HDL Ratio 04/06/2014 2.5   Final  . VLDL 04/06/2014 11  0 - 40 mg/dL Final  . LDL Cholesterol 04/06/2014 75  0 - 99 mg/dL Final   Comment:        Total Cholesterol/HDL:CHD Risk Coronary Heart Disease Risk Table                     Men    Women  1/2 Average Risk   3.4   3.3  Average Risk       5.0   4.4  2 X Average Risk   9.6   7.1  3 X Average Risk  23.4   11.0        Use the calculated Patient Ratio above and the CHD Risk Table to determine the patient's CHD Risk.        ATP III CLASSIFICATION (LDL):  <100     mg/dL   Optimal  100-129  mg/dL   Near or Above                    Optimal  130-159  mg/dL   Borderline  160-189  mg/dL   High  >190     mg/dL   Very High   . Color, Urine 04/06/2014 YELLOW  YELLOW Final  . APPearance 04/06/2014 CLOUDY* CLEAR Final  . Specific Gravity, Urine 04/06/2014 1.011  1.005 - 1.030 Final  . pH 04/06/2014 7.5  5.0 - 8.0 Final  . Glucose, UA 04/06/2014 NEGATIVE  NEGATIVE mg/dL Final  . Hgb urine dipstick 04/06/2014 NEGATIVE  NEGATIVE Final  . Bilirubin Urine 04/06/2014 NEGATIVE  NEGATIVE Final  . Ketones, ur 04/06/2014 NEGATIVE  NEGATIVE mg/dL Final  . Protein, ur 04/06/2014 NEGATIVE  NEGATIVE mg/dL Final  . Urobilinogen, UA 04/06/2014 1.0  0.0 - 1.0 mg/dL Final  . Nitrite 04/06/2014 POSITIVE* NEGATIVE Final  . Leukocytes, UA 04/06/2014 NEGATIVE  NEGATIVE Final  . Specimen Description 04/06/2014 URINE, RANDOM   Final  . Special Requests 04/06/2014 NONE   Final  . Colony Count 04/06/2014    Final  Value:>=100,000 COLONIES/ML Performed at [facility]   . Culture 04/06/2014    Final                   Value:KLEBSIELLA PNEUMONIAE Performed at [facility]   . Report Status 04/06/2014 04/08/2014 FINAL   Final  . Organism ID, Bacteria 04/06/2014 KLEBSIELLA PNEUMONIAE   Final  . Squamous Epithelial / LPF 04/06/2014 RARE  RARE Final  . WBC, UA 04/06/2014 3-6  <3 WBC/hpf Final  . RBC / HPF 04/06/2014 0-2  <3 RBC/hpf Final  . Bacteria, UA 04/06/2014 MANY* RARE Final  . Casts 04/06/2014 HYALINE CASTS* NEGATIVE Final  . Crystals 04/06/2014 CA OXALATE CRYSTALS* NEGATIVE Final  . pH, Arterial 04/06/2014 7.382  7.350 - 7.450 Final  . pCO2 arterial  04/06/2014 40.9  35.0 - 45.0 mmHg Final  . pO2, Arterial 04/06/2014 70.0* 80.0 - 100.0 mmHg Final  . Bicarbonate 04/06/2014 24.3* 20.0 - 24.0 mEq/L Final  . TCO2 04/06/2014 26  0 - 100 mmol/L Final  . O2 Saturation 04/06/2014 93.0   Final  . Acid-base deficit 04/06/2014 1.0  0.0 - 2.0 mmol/L Final  . Sample type 04/06/2014 ARTERIAL   Final  . pH, Ven 04/06/2014 7.376* 7.250 - 7.300 Final  . pCO2, Ven 04/06/2014 42.9* 45.0 - 50.0 mmHg Final  . pO2, Ven 04/06/2014 37.0  30.0 - 45.0 mmHg Final  . Bicarbonate 04/06/2014 25.2* 20.0 - 24.0 mEq/L Final  . TCO2 04/06/2014 26  0 - 100 mmol/L Final  . O2 Saturation 04/06/2014 69.0   Final  . Sample type 04/06/2014 VENOUS   Final  . Comment 04/06/2014 NOTIFIED PHYSICIAN   Final  Office Visit on 02/18/2014  Component Date Value Ref Range Status  . Date Time Interrogation Session 02/18/2014 [id number]   Final  . Pulse Generator Manufacturer 02/18/2014 Medtronic   Final  . Pulse Gen Model 02/18/2014 [id number] Versa   Final  . Pulse Gen Serial Number 02/18/2014 [id number]   Final  . RV Sense Sensitivity 02/18/2014 2.80   Final  . RA Pace Amplitude 02/18/2014 2.500   Final  . RV Pace PulseWidth 02/18/2014 0.40   Final  . RV Pace Amplitude 02/18/2014 2.750   Final  . RA Impedance 02/18/2014 533   Final  . RA Amplitude 02/18/2014 2.00   Final  . RA Pacing Amplitude 02/18/2014 1.250   Final  . RA Pacing PulseWidth 02/18/2014 0.4   Final  . RV IMPEDANCE 02/18/2014 847   Final  . RV Amplitude 02/18/2014 11.20   Final  . RV Pacing Amplitude 02/18/2014 1.250   Final  . RV Pacing PulseWidth 02/18/2014 0.40   Final  . Battery Status 02/18/2014 Unknown   Final  . Battery Longevity 02/18/2014 2 years   Final  . Battery Voltage 02/18/2014 2.69   Final  . Battery Impedance 02/18/2014 1746   Final  . Loletha Grayer AP VP Percent 02/18/2014 86   Final  . Brady AS VP Percent 02/18/2014 0   Final  . Loletha Grayer AP VS Percent 02/18/2014 13   Final  . Loletha Grayer AS VS Percent  02/18/2014 1   Final  . Eval Rhythm 02/18/2014 SR   Final  . Miscellaneous Comment 02/18/2014    Final                   Value:Pacemaker check in clinic. Normal device function. Thresholds, sensing, impedances consistent with previous measurements. Device programmed to maximize longevity. 96 mode switches (0.1%)---52 AHR  episodes---max dur. 26 mins, Max A 191, Max V 124 +  Amio/no A/C---anemia/h/o spontaneous retroperitoneal bleed. No high ventricular rates noted. RA output decreased to appropriate settings. Device programmed at appropriate safety margins. Histogram distribution appropriate for patient activity level.  Device programmed to optimize intrinsic conduction. Estimated longevity 2 years. Patient will follow up via [facility] on 2-22 and with [facility] in 6 months.   Orders Only on 02/18/2014  Component Date Value Ref Range Status  . Sodium 02/18/2014 140  135 - 145 mEq/L Final  . Potassium 02/18/2014 3.9  3.5 - 5.3 mEq/L Final  . Chloride 02/18/2014 102  96 - 112 mEq/L Final  . CO2 02/18/2014 28  19 - 32 mEq/L Final  . Glucose, Bld 02/18/2014 77  70 - 99 mg/dL Final  . BUN 02/18/2014 12  6 - 23 mg/dL Final  . Creat 02/18/2014 0.79  0.50 - 1.10 mg/dL Final  . Total Bilirubin 02/18/2014 0.6  0.2 - 1.2 mg/dL Final  . Alkaline Phosphatase 02/18/2014 120* 39 - 117 U/L Final  . AST 02/18/2014 30  0 - 37 U/L Final  . ALT 02/18/2014 22  0 - 35 U/L Final  . Total Protein 02/18/2014 6.2  6.0 - 8.3 g/dL Final  . Albumin 02/18/2014 3.7  3.5 - 5.2 g/dL Final  . Calcium 02/18/2014 9.0  8.4 - 10.5 mg/dL Final  . TSH 02/18/2014 0.507  0.350 - 4.500 uIU/mL Final    Dg Chest 2 View  04/05/2014   CLINICAL DATA:  Chest pain and shortness of breath today.  EXAM: CHEST  2 VIEW  COMPARISON:  10/20/2013  FINDINGS: Right AICD leads overlie the right atrium and right ventricle. The heart is enlarged. There is minimal right basilar atelectasis. No focal consolidations or pleural effusions. No pulmonary edema. Left  upper quadrant calcification likely represents a splenic artery aneurysm and appears stable over multiple prior studies measuring less than 2 cm in diameter.  IMPRESSION: 1. Cardiomegaly. 2.  No evidence for acute cardiopulmonary abnormality.   Electronically Signed   By: Shon Hale M.D.   On: 04/05/2014 09:32   [facility] records reviewed  Assessment/Plan   ICD-9-CM ICD-10-CM   1. Left hip pain with chronic LBP 719.45 M25.552   2. Unstable angina- currently asymptomatic 411.1 I20.0   3. Coronary artery disease involving native coronary artery of native heart without angina pectoris- nonobstructive per cath report on 04/06/14 414.01 I25.10   4. Paroxysmal a-fib, history of, off coumadin since 10/2010, without recurrence of afib. 427.31 I48.0   5. Essential hypertension- stable 401.9 I10   6. Hypothyroidism, unspecified hypothyroidism type 244.9 E03.9   7. Hyperlipidemia 272.4 E78.5   8. Urinary tract infection without hematuria, site unspecified stable- Klebsiella organism 599.0 N39.0    --stop keflex tx as she has completed course. Start trimethoprim 160m daily to prevent UTI per d/c summary  --continue PT/OT and ST as ordered  --continue tramadol qhs for pain  --continue other medications as ordered  --she will need to f/u with cardiology Dr CSallyanne Kusteras indicated. U[place]ad[place]ill be managed medically.   GOAL: short term rehab to improve functional status. Once medically appropriate, will d/c to home vs ALF.   Jakirah Zaun S. CPerlie Gold PLewisg[facility] [address] StreetGShiprock [place] [phone] (Wednesdays and Fridays 8 AM - 5 PM) ([phone] (Monday-Friday 8 AM - 5 PM)

## 2014-04-28 ENCOUNTER — Encounter: Payer: Self-pay | Admitting: Physician Assistant

## 2014-04-28 ENCOUNTER — Ambulatory Visit (INDEPENDENT_AMBULATORY_CARE_PROVIDER_SITE_OTHER): Payer: Medicare Other | Admitting: Physician Assistant

## 2014-04-28 VITALS — BP 144/70 | Ht 62.0 in | Wt 127.6 lb

## 2014-04-28 DIAGNOSIS — Z8679 Personal history of other diseases of the circulatory system: Secondary | ICD-10-CM

## 2014-04-28 DIAGNOSIS — R42 Dizziness and giddiness: Secondary | ICD-10-CM

## 2014-04-28 DIAGNOSIS — I480 Paroxysmal atrial fibrillation: Secondary | ICD-10-CM

## 2014-04-28 DIAGNOSIS — I2510 Atherosclerotic heart disease of native coronary artery without angina pectoris: Secondary | ICD-10-CM

## 2014-04-28 DIAGNOSIS — I2583 Coronary atherosclerosis due to lipid rich plaque: Secondary | ICD-10-CM

## 2014-04-28 DIAGNOSIS — E785 Hyperlipidemia, unspecified: Secondary | ICD-10-CM

## 2014-04-28 DIAGNOSIS — I200 Unstable angina: Secondary | ICD-10-CM

## 2014-04-28 DIAGNOSIS — I10 Essential (primary) hypertension: Secondary | ICD-10-CM

## 2014-04-28 NOTE — Progress Notes (Signed)
Cardiology Office Note   Date:  04/28/2014   ID:  [name] Luna, DOB 05-12-1920, MRN [id number]  PCP:  Evette GeorgesDD,JEFFREY ALLEN, MD  Cardiologist:  Dr. Royann Shiversroitoru, Previously followed by Dr Geralynn OchsWeintraub   Rhonda Barrett, PA-C   Chief Complaint  Patient presents with  . Follow-up    post hospital-Cath 04/07/14       History of Present Illness: [name] Luna is a 81 y.o. female with a history of CAD, PAF, PPM, HLD, HTN, orthostatic hypotension, EF 55% w/ grade 2 dd. She is not anticoagulated due to increased bleeding risk and had a spontaneous iliopsoas hematoma in 2015 while on aspirin/Plavix, so is only on aspirin 81 mg for anticoagulation.   She was hospitalized 01/04-01/07 for chest pain, had a R/L cath showing normal pressures, moderate D1 disease, otherwise non-obstructive dx, med rx. EF normal by echo.  She was treated for Klebsiella pneumoniae urinary tract infection and completed the abx.   She is at rehab currently and feels she is getting stronger. She's been able to walk some.  She is anxious to get home.  She has no specific complaints today and denies nausea, vomiting, fever, chest pain, shortness of breath, orthopnea, dizziness, PND, cough, congestion, abdominal pain, hematochezia, melena, lower extremity edema.   Past Medical History  Diagnosis Date  . CHF (congestive heart failure)   . GERD (gastroesophageal reflux disease)   . Hypothyroidism   . Complete heart block     a. Bradycardia/CHB s/p Medtronic pacemaker 2009.  [name]. Pancreatitis   . Coronary artery disease     a. DES to LAD 2008, 2011, 2012 (no disease in LM or RCA, mild luminal irreg Cx). b. Normal nuc 08/2011: no ischemia, EF 80%.  . Shingles 1980's    "twice; once on my front side; once on my back" (02/07/2012)  . Pacemaker   . PAF (paroxysmal atrial fibrillation)   . Tremor, essential   . S/P dilatation of esophageal stricture   . H/O hiatal hernia   . Neuromuscular disorder     essential tremors  . UTI  (lower urinary tract infection) 08/03/2011  . Dizziness, near syncope   . Macular degeneration, dry     "both eyes" (02/07/2012)  . Hypertension   . Pneumonia 1927  . Syncope   . Orthostatic hypotension   . Iliopsoas muscle hematoma     a. Spontaneously 05/2013 while on aspirin/Plavix. Not a candidate for anticoagulation other than aspirin due to this.  . Hyperlipidemia   . Memory loss   . H/O valvular heart disease     a. 2D echo 2013: EF 55-60%, grade 2DD, mild MR, mild AI, mild-mod TR, PASP 36mmHg.    Past Surgical History  Procedure Laterality Date  . Repair spigelian hernia    . S/p childbirth      x2  . Cataract extraction w/ intraocular lens  implant, bilateral  ~ 1997  . Vaginal hysterectomy  1970's  . Left oophorectomy  1970's  . Insert / replace / remove pacemaker  2000; 2009    initial placement; replaced  . Appendectomy  1933  . Cholecystectomy  1950's  . Hernia repair    . Esophageal dilation      "once" (02/07/2012)  . Cardiac catheterization  12/24/2005    No intervention - recommend medical therapy  . Cardiac catheterization  10/13/2006    80% proximal LAD stenosis, stented w/ a 3.5x3013mm Cypher stent at 14atm (3.5471mm), resulting in reduction of 80% to  0% residual.  . Cardiac catheterization  11/14/2006    No intervention - recommend medical therapy  . Cardiac catheterization  07/24/2007    No intervention.  . Cardiac catheterization  01/16/2010    75% proximal LAD stenosis w/ in-stent restenosis. No intervention. Dr Allyson Sabal will intervene on proximal LAD lesion.  . Cardiac catheterization  01/16/2010    75% proximal LAD stenosis, stented w/ a 3x67mm Promus DES at 16atm, resulting in reduction 75% to 0% residual  . Cardioversion  07/11/2010    Successful conversion to V paced verified via pacemaker.  . Cardiac catheterization  09/08/2010    LAD 75% in-stent restenosis, stented w/ a 3x96mm Resolute DES at 16atm (3.70mm), resulting in reduction of 75% stenosis  . Renal  doppler  12/12/2010    Bilat proximal renal 1-59% diameter reduction. Bilat kidneys are normal.  . Cardiac catheterization  12/22/2010    No intervention - recommend medical therapy.  . Cardiovascular stress test  08/09/2011    No scintigraphic evidence of inducible myocardial ischemia. Non-diagnostic for ischemia.  . Transthoracic echocardiogram  01/21/2012    EF 55-60%, mild-moderate tricuspid regurg.  [name] Percutaneous coronary stent intervention (pci-s)  04/06/2014    Procedure: PERCUTANEOUS CORONARY STENT INTERVENTION (PCI-S);  Surgeon: Peter M Swaziland, MD;  Location: [facility] CATH LAB;  Service: Cardiovascular;;  . Cardiac catheterization  04/06/2014    Procedure: LEFT HEART CATH AND CORONARY ANGIOGRAPHY;  Surgeon: Peter M Swaziland, MD;  Location: [facility] CATH LAB;  Service: Cardiovascular;;     Current Outpatient Prescriptions  Medication Sig Dispense Refill  . amiodarone (PACERONE) 200 MG tablet TAKE ONE TABLET BY MOUTH ONCE DAILY 30 tablet 11  . aspirin 81 MG EC tablet Take 81 mg by mouth daily.      . bisacodyl (DULCOLAX) 5 MG EC tablet Take 5 mg by mouth 2 (two) times daily.    [name] levothyroxine (SYNTHROID, LEVOTHROID) 100 MCG tablet TAKE ONE TABLET BY MOUTH ONCE DAILY BEFORE BREAKFAST 90 tablet 0  . loratadine (CLARITIN) 10 MG tablet Take 10 mg by mouth daily.      [name] losartan (COZAAR) 50 MG tablet Take 1 tablet (50 mg total) by mouth 2 (two) times daily. 180 tablet 3  . metoprolol (LOPRESSOR) 25 MG tablet Take 2 tablets (50 mg total) by mouth 2 (two) times daily. 60 tablet 6  . midodrine (PROAMATINE) 2.5 MG tablet Take 1 tablet (2.5 mg total) by mouth daily before breakfast. 30 tablet 11  . nitroGLYCERIN (NITROSTAT) 0.4 MG SL tablet Place 0.4 mg under the tongue every 5 (five) minutes as needed for chest pain.     . pantoprazole (PROTONIX) 40 MG tablet Take 1 tablet (40 mg total) by mouth 2 (two) times daily. 30 tablet 0  . ranolazine (RANEXA) 500 MG 12 hr tablet Take 1 tablet (500 mg total) by mouth  daily. 30 tablet 11  . simvastatin (ZOCOR) 20 MG tablet Take 1 tablet (20 mg total) by mouth at bedtime. 30 tablet 0  . traMADol (ULTRAM) 50 MG tablet Take 0.5 tablets (25 mg total) by mouth at bedtime. 30 tablet 0  . trimethoprim (TRIMPEX) 100 MG tablet Take 1 tablet (100 mg total) by mouth at bedtime. Do not take while on Keflex (cefazolin).     No current facility-administered medications for this visit.    Allergies:   Codeine; Imdur; Isosorbide mononitrate; Lorazepam; Prednisone; Penicillins; and Valium    Social History:  The patient  reports that she has never smoked. She  has never used smokeless tobacco. She reports that she does not drink alcohol or use illicit drugs.   Family History:  family history includes Sudden death in her mother.    ROS:  Please see the history of present illness.   Otherwise, review of systems are positive for none.   All other systems are reviewed and negative.    PHYSICAL EXAM: VS:  BP 144/70 mmHg  Ht  (1.575 m)  Wt 127 lb 9.6 oz (57.879 kg)  BMI 23.33 kg/m2 , BMI Body mass index is 23.33 kg/(m^2). GEN: Well nourished, well developed, in no acute distress HEENT: normal Neck: no JVD, carotid bruits, or masses Cardiac: RRR; no murmurs, rubs, or gallops,no edema  Respiratory:  clear to auscultation bilaterally although breath sounds are decreased throughout. normal work of breathing GI: soft, nontender, nondistended, + BS Skin: warm and dry, no rash Neuro:  Grossly normal.   Right hand tremor Psych: euthymic mood, full affect   EKG:  EKG was not ordered today.    Recent Labs: 04/05/2014: ALT 27; Magnesium 2.0; TSH 0.684 04/06/2014: BUN 10; Creatinine 0.78; Hemoglobin 11.8*; Platelets 168; Potassium 3.5; Sodium 137    Lipid Panel    Component Value Date/Time   CHOL 144 04/06/2014 0136   TRIG 56 04/06/2014 0136   HDL 58 04/06/2014 0136   CHOLHDL 2.5 04/06/2014 0136   VLDL 11 04/06/2014 0136   LDLCALC 75 04/06/2014 0136      Wt  Readings from Last 3 Encounters:  04/28/14 127 lb 9.6 oz (57.879 kg)  04/22/14 130 lb (58.968 kg)  04/12/14 133 lb (60.328 kg)      ASSESSMENT AND PLAN:  Problem List Items Addressed This Visit    Paroxysmal a-fib, history of, off coumadin since 10/2010, without recurrence of afib. (Chronic)     Heart rate is regular on exam.   She is on amiodarone and metoprolol      Hyperlipidemia (Chronic)    Continue statin      H/O orthostatic hypotension (Chronic)   Essential hypertension - Primary (Chronic)    BP Mild elevated.  Due to chronic issues with orthostatic hypotension, she needs some permissive hypertension.        Dizziness, near syncope     Her complaints of dizziness. Patient is on Midodrin      CAD (coronary artery disease) (Chronic)     No complaints of angina.   Status post left heart catheterization revealing nonobstructive coronary disease. Right heart cath revealed mild pulmonary hypertension and normal LV filling pressures.           Current medicines are reviewed at length with the patient today.  The patient does not have concerns regarding medicines.  The following changes have been made:  no change  Labs/ tests ordered today include: none  No orders of the defined types were placed in this encounter.     Burt Ek, [facility] 04/28/2014 11:17 AM    [facility] [address] [place], [place], [place]  [address] Phone: [phone]; Fax: [phone]

## 2014-04-28 NOTE — Patient Instructions (Signed)
Your physician recommends that you schedule a follow-up appointment in: 3 months with Dr. Croitoru.  

## 2014-04-28 NOTE — Assessment & Plan Note (Signed)
Continue statin. 

## 2014-04-28 NOTE — Assessment & Plan Note (Signed)
Her complaints of dizziness. Patient is on Midodrin

## 2014-04-28 NOTE — Assessment & Plan Note (Signed)
BP Mild elevated.  Due to chronic issues with orthostatic hypotension, she needs some permissive hypertension.

## 2014-04-28 NOTE — Assessment & Plan Note (Signed)
No complaints of angina.   Status post left heart catheterization revealing nonobstructive coronary disease. Right heart cath revealed mild pulmonary hypertension and normal LV filling pressures.

## 2014-04-28 NOTE — Assessment & Plan Note (Addendum)
Heart rate is regular on exam.   She is on amiodarone and metoprolol

## 2014-04-30 ENCOUNTER — Ambulatory Visit: Payer: Medicare Other | Admitting: Cardiology

## 2014-05-14 ENCOUNTER — Other Ambulatory Visit: Payer: Self-pay | Admitting: Physician Assistant

## 2014-05-24 ENCOUNTER — Ambulatory Visit (INDEPENDENT_AMBULATORY_CARE_PROVIDER_SITE_OTHER): Payer: Medicare Other | Admitting: *Deleted

## 2014-05-24 ENCOUNTER — Telehealth: Payer: Self-pay | Admitting: Cardiology

## 2014-05-24 DIAGNOSIS — I4891 Unspecified atrial fibrillation: Secondary | ICD-10-CM

## 2014-05-24 LAB — MDC_IDC_ENUM_SESS_TYPE_REMOTE
Battery Voltage: 2.75 V
Brady Statistic AP VP Percent: 88 %
Brady Statistic AP VS Percent: 10 %
Brady Statistic AS VP Percent: 1 %
Date Time Interrogation Session: 20160222180219
Lead Channel Impedance Value: 541 Ohm
Lead Channel Impedance Value: 859 Ohm
Lead Channel Pacing Threshold Amplitude: 0.75 V
Lead Channel Pacing Threshold Amplitude: 1.375 V
Lead Channel Pacing Threshold Pulse Width: 0.4 ms
Lead Channel Setting Pacing Amplitude: 2.5 V
Lead Channel Setting Pacing Amplitude: 2.75 V
Lead Channel Setting Pacing Pulse Width: 0.4 ms
Lead Channel Setting Sensing Sensitivity: 2.8 mV
MDC IDC MSMT BATTERY IMPEDANCE: 2126 Ohm
MDC IDC MSMT BATTERY REMAINING LONGEVITY: 24 mo
MDC IDC MSMT LEADCHNL RA PACING THRESHOLD PULSEWIDTH: 0.4 ms
MDC IDC STAT BRADY AS VS PERCENT: 2 %

## 2014-05-24 NOTE — Telephone Encounter (Signed)
LMOVM reminding pt to send remote transmission.   

## 2014-05-24 NOTE — Progress Notes (Signed)
Remote pacemaker transmission.   

## 2014-06-01 ENCOUNTER — Encounter: Payer: Self-pay | Admitting: Cardiovascular Disease

## 2014-06-04 ENCOUNTER — Encounter: Payer: Self-pay | Admitting: Cardiology

## 2014-06-09 ENCOUNTER — Encounter: Payer: Self-pay | Admitting: Cardiovascular Disease

## 2014-06-24 ENCOUNTER — Encounter: Payer: Self-pay | Admitting: Neurology

## 2014-06-24 ENCOUNTER — Ambulatory Visit (INDEPENDENT_AMBULATORY_CARE_PROVIDER_SITE_OTHER): Payer: Medicare Other | Admitting: Neurology

## 2014-06-24 VITALS — BP 147/88 | HR 89 | Temp 97.9°F | Ht 62.0 in

## 2014-06-24 DIAGNOSIS — F0280 Dementia in other diseases classified elsewhere without behavioral disturbance: Secondary | ICD-10-CM | POA: Diagnosis not present

## 2014-06-24 DIAGNOSIS — I951 Orthostatic hypotension: Secondary | ICD-10-CM

## 2014-06-24 DIAGNOSIS — G20 Parkinson's disease: Secondary | ICD-10-CM | POA: Diagnosis not present

## 2014-06-24 DIAGNOSIS — H3530 Unspecified macular degeneration: Secondary | ICD-10-CM | POA: Diagnosis not present

## 2014-06-24 DIAGNOSIS — G309 Alzheimer's disease, unspecified: Secondary | ICD-10-CM

## 2014-06-24 DIAGNOSIS — G20C Parkinsonism, unspecified: Secondary | ICD-10-CM | POA: Insufficient documentation

## 2014-06-24 DIAGNOSIS — I200 Unstable angina: Secondary | ICD-10-CM

## 2014-06-24 MED ORDER — CARBIDOPA-LEVODOPA 25-100 MG PO TABS: 0.5000 | ORAL_TABLET | Freq: Three times a day (TID) | ORAL | Status: DC

## 2014-06-24 NOTE — Progress Notes (Addendum)
[facility]    Provider:  Dr Lucia GaskinsAhern Referring Provider: Roderick Peeodd, Jeffrey A, MD Primary Care Physician:  Evette GeorgesDD,JEFFREY ALLEN, MD  CC:  Tremors HPI:  [name] Luna is a 81 y.o. female here as a referral from Dr. Tawanna Coolerodd for tremors  81 year old patient PMHx HTN, Afib, Syncope, orthostatic hypotension, gerd, UTI, pacemaker, macular degeneration, CAD, constipation, hypothyroid, HLD, Alzheimer's disease.   Here with sone who provides most information. C/o leg weakness, tremors, falls. The weakness in the legs and tremors is the most concerning. Legs seem to be strong but seems to be a balance issue and patient is scared to walk due to falls and vision loss. She has fallen but not in the last year or two. Leg issue has been 3-4 years, progressing slowly. She shuffles. Son provides all information. Both hands have tremor. At times it is difficult to eat, she can't hold the fork due to the trmor. Son has to feed her because she can't see and also because she has a tremor. She can't see her food. Tremor is interferring with the basic ADLs. She has had physical therapy, occupational therapy. She used to walk at home with a walker, gait progressively got worse and was more difficult  to keep her from falling. She would just lose her balance. She has orthostatic hypotension and sometimes gets dizzy when she stands up, that seems to have stabilized some as far as he can tell. Has constipation. Tremor worse with anxiety/stress.   Reviewed notes, labs and imaging from outside physicians, which showed:   CT of the head:  Calvarium is intact. There is moderate diffuse atrophy. There is fairly severe low attenuation in the periventricular white matter diffusely. This is stable. There is no evidence of vascular territory infarct. There is no hemorrhage or extra-axial fluid. No evidence of mass or hydrocephalus  Personally reviewed and agree with findings.   CBC with mild anemia, GFR 71  otherwise largely unremarkable, TSH nml  Review of Systems: Patient complains of symptoms per HPI as well as the following symptoms: loss of vision, easy bruising, hearing loss, urination problems, incontinence, allergies, runny npse, difficulty swallowing, dizziness, tremor. Pertinent negatives per HPI. All others negative.   History   Social History  . Marital Status: Widowed    Spouse Name: N/A  . Number of Children: 2  . Years of Education: 12   Occupational History  . Not on file.   Social History Main Topics  . Smoking status: Never Smoker   . Smokeless tobacco: Never Used  . Alcohol Use: No  . Drug Use: No  . Sexual Activity: No   Other Topics Concern  . Not on file   Social History Narrative   [facility].    Caffeine use: very little    Family History  Problem Relation Age of Onset  . Sudden death Mother     Past Medical History  Diagnosis Date  . CHF (congestive heart failure)   . GERD (gastroesophageal reflux disease)   . Hypothyroidism   . Complete heart block     a. Bradycardia/CHB s/p Medtronic pacemaker 2009.  [name]. Pancreatitis   . Coronary artery disease     a. DES to LAD 2008, 2011, 2012 (no disease in LM or RCA, mild luminal irreg Cx). b. Normal nuc 08/2011: no ischemia, EF 80%.  . Shingles 1980's    "twice; once on my front side; once on my back" (02/07/2012)  . Pacemaker   .  PAF (paroxysmal atrial fibrillation)   . Tremor, essential   . S/P dilatation of esophageal stricture   . H/O hiatal hernia   . Neuromuscular disorder     essential tremors  . UTI (lower urinary tract infection) 08/03/2011  . Dizziness, near syncope   . Macular degeneration, dry     "both eyes" (02/07/2012)  . Hypertension   . Pneumonia 1927  . Syncope   . Orthostatic hypotension   . Iliopsoas muscle hematoma     a. Spontaneously 05/2013 while on aspirin/Plavix. Not a candidate for anticoagulation other than aspirin due to this.  . Hyperlipidemia   .  Memory loss   . H/O valvular heart disease     a. 2D echo 2013: EF 55-60%, grade 2DD, mild MR, mild AI, mild-mod TR, PASP .    Past Surgical History  Procedure Laterality Date  . Repair spigelian hernia    . S/p childbirth      x2  . Cataract extraction w/ intraocular lens  implant, bilateral  ~ 1997  . Vaginal hysterectomy  1970's  . Left oophorectomy  1970's  . Insert / replace / remove pacemaker  2000; 2009    initial placement; replaced  . Appendectomy  1933  . Cholecystectomy  1950's  . Hernia repair    . Esophageal dilation      "once" (02/07/2012)  . Cardiac catheterization  12/24/2005    No intervention - recommend medical therapy  . Cardiac catheterization  10/13/2006    80% proximal LAD stenosis, stented w/ a 3.5x41mm Cypher stent at 14atm (3.24mm), resulting in reduction of 80% to 0% residual.  . Cardiac catheterization  11/14/2006    No intervention - recommend medical therapy  . Cardiac catheterization  07/24/2007    No intervention.  . Cardiac catheterization  01/16/2010    75% proximal LAD stenosis w/ in-stent restenosis. No intervention. Dr Allyson Sabal will intervene on proximal LAD lesion.  . Cardiac catheterization  01/16/2010    75% proximal LAD stenosis, stented w/ a 3x30mm Promus DES at 16atm, resulting in reduction 75% to 0% residual  . Cardioversion  07/11/2010    Successful conversion to V paced verified via pacemaker.  . Cardiac catheterization  09/08/2010    LAD 75% in-stent restenosis, stented w/ a 3x65mm Resolute DES at 16atm (3.108mm), resulting in reduction of 75% stenosis  . Renal doppler  12/12/2010    Bilat proximal renal 1-59% diameter reduction. Bilat kidneys are normal.  . Cardiac catheterization  12/22/2010    No intervention - recommend medical therapy.  . Cardiovascular stress test  08/09/2011    No scintigraphic evidence of inducible myocardial ischemia. Non-diagnostic for ischemia.  . Transthoracic echocardiogram  01/21/2012    EF 55-60%,  mild-moderate tricuspid regurg.  [name] Percutaneous coronary stent intervention (pci-s)  04/06/2014    Procedure: PERCUTANEOUS CORONARY STENT INTERVENTION (PCI-S);  Surgeon: Peter M Swaziland, MD;  Location: [facility] CATH LAB;  Service: Cardiovascular;;  . Cardiac catheterization  04/06/2014    Procedure: LEFT HEART CATH AND CORONARY ANGIOGRAPHY;  Surgeon: Peter M Swaziland, MD;  Location: [facility] CATH LAB;  Service: Cardiovascular;;    Current Outpatient Prescriptions  Medication Sig Dispense Refill  . acetaminophen (TYLENOL) 325 MG tablet Take 325 mg by mouth every 6 (six) hours as needed.    . bisacodyl (DULCOLAX) 5 MG EC tablet Take 5 mg by mouth 2 (two) times daily.    [name] levothyroxine (SYNTHROID, LEVOTHROID) 100 MCG tablet TAKE ONE TABLET BY MOUTH ONCE DAILY  BEFORE BREAKFAST 90 tablet 0  . losartan (COZAAR) 50 MG tablet Take 1 tablet (50 mg total) by mouth 2 (two) times daily. 180 tablet 3  . metoprolol (LOPRESSOR) 25 MG tablet Take 2 tablets (50 mg total) by mouth 2 (two) times daily. 60 tablet 6  . pantoprazole (PROTONIX) 40 MG tablet Take 1 tablet (40 mg total) by mouth 2 (two) times daily. 30 tablet 0  . QUEtiapine (SEROQUEL) 25 MG tablet Take 25 mg by mouth every morning.    [name] amiodarone (PACERONE) 200 MG tablet TAKE ONE TABLET BY MOUTH ONCE DAILY 30 tablet 11  . aspirin 81 MG EC tablet Take 81 mg by mouth daily.      [name] loratadine (CLARITIN) 10 MG tablet Take 10 mg by mouth daily.      . midodrine (PROAMATINE) 2.5 MG tablet Take 1 tablet (2.5 mg total) by mouth daily before breakfast. 30 tablet 11  . nitroGLYCERIN (NITROSTAT) 0.4 MG SL tablet Place 0.4 mg under the tongue every 5 (five) minutes as needed for chest pain.     . ranolazine (RANEXA) 500 MG 12 hr tablet Take 1 tablet (500 mg total) by mouth daily. 30 tablet 11  . simvastatin (ZOCOR) 20 MG tablet Take 1 tablet (20 mg total) by mouth at bedtime. 30 tablet 0  . traMADol (ULTRAM) 50 MG tablet Take 0.5 tablets (25 mg total) by mouth at bedtime. 30  tablet 0  . trimethoprim (TRIMPEX) 100 MG tablet Take 1 tablet (100 mg total) by mouth at bedtime. Do not take while on Keflex (cefazolin).     No current facility-administered medications for this visit.    Allergies as of 06/24/2014 - Review Complete 06/24/2014  Allergen Reaction Noted  . Codeine Other (See Comments) 11/12/2006  . Imdur [isosorbide] Other (See Comments) 05/02/2012  . Isosorbide mononitrate Other (See Comments) 07/29/2007  . Lorazepam Other (See Comments) 11/12/2006  . Prednisone Nausea And Vomiting 11/12/2006  . Penicillins Rash 11/12/2006  . Valium [diazepam] Other (See Comments) 11/08/2011    Vitals: BP 147/88 mmHg  Pulse 89  Temp(Src) 97.9 F (36.6 C)  Ht  (1.575 m) Last Weight:  Wt Readings from Last 1 Encounters:  04/28/14 127 lb 9.6 oz (57.879 kg)   Last Height:   Ht Readings from Last 1 Encounters:  06/24/14  (1.575 m)   Physical exam: Exam: Gen: NAD, not conversant                   CV: RRR. No Carotid Bruits. No peripheral edema, warm, nontender Eyes: Conjunctivae clear without exudates or hemorrhage  Neuro: Detailed Neurologic Exam  Speech:    No aphasia, no dysarthria, can answer questions appropriately Cognition:    The patient is oriented to person.     recent and remote memory impaired;     language without aphasia, dysarthira;     normal attention, concentration,     fund of knowledge Cranial Nerves:    The pupils are equal, round, and reactive to light. Attempted to visualize fundi, cannot due to small pupils. VF limited but grossly intact to finger waving. . Impaired upgaze. Trigeminal sensation is intact and the muscles of mastication are normal. The face is symmetric. The palate elevates in the midline. Hearing impaired. Voice is normal. Shoulder shrug is normal. The tongue has normal motion without fasciculations.   Coordination:    No dysmteria on FTN, difficulty with HTS   Gait:Needed help with standing, became  anxious,  appeared to shuffle but could not fully evaluate as she wanted to sit down almost immediately as she was scared of falling      Motor Observation:    Left > right resting tremor with intention and postural components Tone:    Increased tone with cogwheeling, axial rigidity  Posture:    Posture is normal in wheel chair    Strength:    Strength is 4/5 in the uppers, 4+/5 in the lowers     Sensation: intact to LT     Reflex Exam:  DTR's:    [place]at[place]   Toes:    The toes are equivocal bilaterally.   Clonus:    Clonus is absent.     Assessment/Plan:  81 year old patient PMHx HTN, Afib, Syncope, orthostatic hypotension, gerd, UTI, pacemaker, macular degeneration, CAD, constipation, hypothyroid, HLD, Alzheimer's disease.   Here with sone who provides most information. C/o gait difficulty, balance problems, tremors, falls. The weakness in the legs and tremors is the most concerning. Slowly progressive. Lives in a nursing home.  She shuffles per son when she walks, could not fully evaluate gait in office. Son provides all information.. At times it is difficult to eat, perform ADLs due to tremor.  Exam significant for increased extremity and axial tone with cogwheeling, bilateral resting tremor with intention and postural components, impaired upgaze. Consistent with Parkinsonism. May be idiopathic parkinson's disease however history of significant falls as presenting symptom suspect for parkinson's plus syndrome. Discussed with patient, at this point expectations have to be managed given age, dementia, significant vision loss. Can try Sinemet low dose to see if tremor improves but warned this can cause side effects such as orthostatic hypotension which patient already has. If this is a parkinson plus syndrome, sinemet may not help. Son understands. Can try Sinemet 1/2 tablet tid with close supervision.  As far as your medications are concerned, I would like to suggest:  Carbidopa/Levodopa 1/2 tab three times daily. -Try to separate Sinemet from food (especially protein-rich foods like meat, dairy, eggs) by about 30-60 mins - this will help the absorption of the medication. If you have some nausea with the medication, you can take it with some light food like crackers or ginger ale    Naomie Dean, MD  [facility] [address] [place], [place] [address]  Phone [phone] Fax [phone]

## 2014-06-24 NOTE — Patient Instructions (Addendum)
Overall you are doing fairly well but I do want to suggest a few things today:   Remember to drink plenty of fluid, eat healthy meals and do not skip any meals. Try to eat protein with a every meal and eat a healthy snack such as fruit or nuts in between meals. Try to keep a regular sleep-wake schedule and try to exercise daily, particularly in the form of walking, 20-30 minutes a day, if you can.   As far as your medications are concerned, I would like to suggest: Carbidopa/Levodopa 1/2 tab three times daily. -Try to separate Sinemet from food (especially protein-rich foods like meat, dairy, eggs) by about 30-60 mins - this will help the absorption of the medication. If you have some nausea with the medication, you can take it with some light food like crackers or ginger ale  I would like to see you back in 3 months, sooner if we need to. Please call us with any interim questions, concerns, problems, updates or refill requests.   Our phone number is [phone]. We also have an after hours call service for urgent matters and there is a physician on-call for urgent questions. For any emergencies you know to call 911 or go to the nearest emergency room

## 2014-07-29 ENCOUNTER — Encounter: Payer: Self-pay | Admitting: Cardiovascular Disease

## 2014-07-29 ENCOUNTER — Ambulatory Visit (INDEPENDENT_AMBULATORY_CARE_PROVIDER_SITE_OTHER): Payer: Medicare Other | Admitting: Cardiovascular Disease

## 2014-07-29 VITALS — BP 126/64 | HR 70 | Resp 20 | Ht 62.0 in

## 2014-07-29 DIAGNOSIS — I200 Unstable angina: Secondary | ICD-10-CM

## 2014-07-29 DIAGNOSIS — Z950 Presence of cardiac pacemaker: Secondary | ICD-10-CM

## 2014-07-29 DIAGNOSIS — Z8679 Personal history of other diseases of the circulatory system: Secondary | ICD-10-CM

## 2014-07-29 DIAGNOSIS — E785 Hyperlipidemia, unspecified: Secondary | ICD-10-CM | POA: Diagnosis not present

## 2014-07-29 DIAGNOSIS — I480 Paroxysmal atrial fibrillation: Secondary | ICD-10-CM

## 2014-07-29 DIAGNOSIS — I25118 Atherosclerotic heart disease of native coronary artery with other forms of angina pectoris: Secondary | ICD-10-CM

## 2014-07-29 DIAGNOSIS — I519 Heart disease, unspecified: Secondary | ICD-10-CM

## 2014-07-29 DIAGNOSIS — I10 Essential (primary) hypertension: Secondary | ICD-10-CM | POA: Diagnosis not present

## 2014-07-29 DIAGNOSIS — I5189 Other ill-defined heart diseases: Secondary | ICD-10-CM

## 2014-07-29 NOTE — Patient Instructions (Addendum)
Remote monitoring is used to monitor your PACEMAKER from home. This monitoring reduces the number of office visits required to check your device to one time per year. It allows us to keep an eye on the functioning of your device to ensure it is working properly. You are scheduled for a device check from home monthly. You may send your transmission at any time that day. If you have a wireless device, the transmission will be sent automatically. After your physician reviews your transmission, you will receive a postcard with your next transmission date.  Your physician recommends that you schedule a follow-up appointment in: 3 MONTHS WITH DR.[email or web address] with pacemaker check (MDT - GRAY).

## 2014-07-29 NOTE — Progress Notes (Signed)
Patient ID: [name] Luna, female   DOB: 03-08-1921, 81 y.o.   MRN: [id number]     Cardiology Office Note   Date:  07/29/2014   ID:  [name] Luna, DOB 11/04/1920, MRN [id number]  PCP:  Evette Georges, MD  Cardiologist:   Thurmon Fair, MD   No chief complaint on file.     History of Present Illness: [name] Luna is a 81 y.o. female who presents for pacemaker f/u, CHB (pacemekr dependent), PAFib and CAD.    [name] is a 81 y/o F with history of CAD (DES to LAD 2008 -2011 and 2012, normal nuc 08/2011 with EF 80%, Coronary angiography in January 2016 showed non-obstructive disease.), PAF (not a candidate for anticoag other than aspirin due to h/o spontaneous iliopsoas hematoma 05/2013 while on ASA/Plavix), GERD, esophageal stricture s/p dilatation, CHB s/p MDT pacemaker 2009, essential tremor, macular degeneration, and syncope/orthostatic hypotension (BPs allowed to run higher due to this). Other problems include hypothyroidism, HLD , Parkinson's Plus sd. and Alzheimer's disease.   She is now a full-time resident at [facility] nursing home. It seems that her gait instability and cognitive deficits of progressive the point that her son can no longer take care of her. She generally feels well and has not had any recent problems with chest pain. She has not had syncope or significant dizziness.  Pacemaker interrogation shows that she has an estimated generator longevity of about 5 months (range 1-10 months) cyst. She is pacemaker dependent today without any detectable P waves or R waves. Despite this she has times when she has native rhythm( atrial fibrillation) and native AV conduction. There is 90% ventricular pacing and 96% atrial pacing. At the auto capture feature on her device recorded her atrial threshold as "high", but manual testing today shows an excellent atrial threshold of 0.75 V at 0.4 ms. Ventricular pacing threshold is 1.25 V at 0.4 ms. Atrial lead impedance is 495 ohm,  ventricular lead impedance is 833 all. There are no detectable P waves or R waves.  The leads were implanted in 2000. Her current generator is a Medtronic versa implanted in 2009.  Pacemaker interrogation also shows occasional episodes of paroxysmal atrial fibrillation with an overall burden of 2.6%. The longest episode was in early March when she was in and out of atrial fibrillation over a period of about 4 days. Ventricular rate control is always excellent with an average ventricular rate in the 80s. She is on low-dose amiodarone. She cannot take anticoagulation due to previous serious bleeding complications. She is on aspirin monotherapy.   Past Medical History  Diagnosis Date  . CHF (congestive heart failure)   . GERD (gastroesophageal reflux disease)   . Hypothyroidism   . Complete heart block     a. Bradycardia/CHB s/p Medtronic pacemaker 2009.  [name] Pancreatitis   . Coronary artery disease     a. DES to LAD 2008, 2011, 2012 (no disease in LM or RCA, mild luminal irreg Cx). b. Normal nuc 08/2011: no ischemia, EF 80%.  . Shingles 1980's    "twice; once on my front side; once on my back" (02/07/2012)  . Pacemaker   . PAF (paroxysmal atrial fibrillation)   . Tremor, essential   . S/P dilatation of esophageal stricture   . H/O hiatal hernia   . Neuromuscular disorder     essential tremors  . UTI (lower urinary tract infection) 08/03/2011  . Dizziness, near syncope   . Macular degeneration, dry     "  both eyes" (02/07/2012)  . Hypertension   . Pneumonia 1927  . Syncope   . Orthostatic hypotension   . Iliopsoas muscle hematoma     a. Spontaneously 05/2013 while on aspirin/Plavix. Not a candidate for anticoagulation other than aspirin due to this.  . Hyperlipidemia   . Memory loss   . H/O valvular heart disease     a. 2D echo 2013: EF 55-60%, grade 2DD, mild MR, mild AI, mild-mod TR, PASP .    Past Surgical History  Procedure Laterality Date  . Repair spigelian hernia    .  S/p childbirth      x2  . Cataract extraction w/ intraocular lens  implant, bilateral  ~ 1997  . Vaginal hysterectomy  1970's  . Left oophorectomy  1970's  . Insert / replace / remove pacemaker  2000; 2009    initial placement; replaced  . Appendectomy  1933  . Cholecystectomy  1950's  . Hernia repair    . Esophageal dilation      "once" (02/07/2012)  . Cardiac catheterization  12/24/2005    No intervention - recommend medical therapy  . Cardiac catheterization  10/13/2006    80% proximal LAD stenosis, stented w/ a 3.5x58mm Cypher stent at 14atm (3.48mm), resulting in reduction of 80% to 0% residual.  . Cardiac catheterization  11/14/2006    No intervention - recommend medical therapy  . Cardiac catheterization  07/24/2007    No intervention.  . Cardiac catheterization  01/16/2010    75% proximal LAD stenosis w/ in-stent restenosis. No intervention. Dr Allyson Sabal will intervene on proximal LAD lesion.  . Cardiac catheterization  01/16/2010    75% proximal LAD stenosis, stented w/ a 3x21mm Promus DES at 16atm, resulting in reduction 75% to 0% residual  . Cardioversion  07/11/2010    Successful conversion to V paced verified via pacemaker.  . Cardiac catheterization  09/08/2010    LAD 75% in-stent restenosis, stented w/ a 3x54mm Resolute DES at 16atm (3.75mm), resulting in reduction of 75% stenosis  . Renal doppler  12/12/2010    Bilat proximal renal 1-59% diameter reduction. Bilat kidneys are normal.  . Cardiac catheterization  12/22/2010    No intervention - recommend medical therapy.  . Cardiovascular stress test  08/09/2011    No scintigraphic evidence of inducible myocardial ischemia. Non-diagnostic for ischemia.  . Transthoracic echocardiogram  01/21/2012    EF 55-60%, mild-moderate tricuspid regurg.  [name] Percutaneous coronary stent intervention (pci-s)  04/06/2014    Procedure: PERCUTANEOUS CORONARY STENT INTERVENTION (PCI-S);  Surgeon: Peter M Swaziland, MD;  Location: [facility] CATH LAB;  Service:  Cardiovascular;;  . Cardiac catheterization  04/06/2014    Procedure: LEFT HEART CATH AND CORONARY ANGIOGRAPHY;  Surgeon: Peter M Swaziland, MD;  Location: [facility] CATH LAB;  Service: Cardiovascular;;     Current Outpatient Prescriptions  Medication Sig Dispense Refill  . acetaminophen (TYLENOL) 325 MG tablet Take 325 mg by mouth every 6 (six) hours as needed.    [name] amiodarone (PACERONE) 200 MG tablet TAKE ONE TABLET BY MOUTH ONCE DAILY 30 tablet 11  . aspirin 81 MG EC tablet Take 81 mg by mouth daily.      . bisacodyl (DULCOLAX) 5 MG EC tablet Take 5 mg by mouth 2 (two) times daily.    . carbidopa-levodopa (SINEMET) 25-100 MG per tablet Take 0.5 tablets by mouth 3 (three) times daily. 1/2 tablet po at bedtime. Can repeat additional 1/2 tablet later in evening as needed 90 tablet 3  .  divalproex (DEPAKOTE) 125 MG DR tablet Take 125 mg by mouth at bedtime.    [name]. levothyroxine (SYNTHROID, LEVOTHROID) 100 MCG tablet TAKE ONE TABLET BY MOUTH ONCE DAILY BEFORE BREAKFAST 90 tablet 0  . loratadine (CLARITIN) 10 MG tablet Take 10 mg by mouth daily.      [name]. losartan (COZAAR) 50 MG tablet Take 1 tablet (50 mg total) by mouth 2 (two) times daily. 180 tablet 3  . metoprolol (LOPRESSOR) 25 MG tablet Take 2 tablets (50 mg total) by mouth 2 (two) times daily. 60 tablet 6  . midodrine (PROAMATINE) 2.5 MG tablet Take 1 tablet (2.5 mg total) by mouth daily before breakfast. 30 tablet 11  . nitroGLYCERIN (NITROSTAT) 0.4 MG SL tablet Place 0.4 mg under the tongue every 5 (five) minutes as needed for chest pain.     . Nutritional Supplements (NUTRITIONAL SHAKE PO) Take 8 oz by mouth 2 (two) times daily. 8 oz in am and 8oz in pm ([facility] Pass)    . pantoprazole (PROTONIX) 40 MG tablet Take 1 tablet (40 mg total) by mouth 2 (two) times daily. 30 tablet 0  . promethazine (PHENERGAN) 12.5 MG tablet Take 12.5 mg by mouth 3 (three) times daily as needed for nausea or vomiting.    [name]. QUEtiapine (SEROQUEL) 25 MG tablet Take 25 mg by mouth  every morning.    . ranolazine (RANEXA) 500 MG 12 hr tablet Take 1 tablet (500 mg total) by mouth daily. 30 tablet 11  . simvastatin (ZOCOR) 20 MG tablet Take 1 tablet (20 mg total) by mouth at bedtime. 30 tablet 0  . traMADol (ULTRAM) 50 MG tablet Take 0.5 tablets (25 mg total) by mouth at bedtime. 30 tablet 0  . trimethoprim (TRIMPEX) 100 MG tablet Take 1 tablet (100 mg total) by mouth at bedtime. Do not take while on Keflex (cefazolin).     No current facility-administered medications for this visit.    Allergies:   Codeine; Imdur; Isosorbide mononitrate; Lorazepam; Prednisone; Penicillins; and Valium    Social History:  The patient  reports that she has never smoked. She has never used smokeless tobacco. She reports that she does not drink alcohol or use illicit drugs.   Family History:  The patient's family history includes Sudden death in her mother.    ROS:  Please see the history of present illness.    Otherwise, review of systems positive for none.   All other systems are reviewed and negative.    PHYSICAL EXAM: VS:  BP 126/64 mmHg  Pulse 70  Resp 20  Ht 5\' 2"  (1.575 m)  Wt  , BMI There is no weight on file to calculate BMI.  General: Alert, oriented x3, no distress  Head: no evidence of trauma, PERRL, EOMI, no exophtalmos or lid lag, no myxedema, no xanthelasma; normal ears, nose and oropharynx  Neck: normal jugular venous pulsations and no hepatojugular reflux; brisk carotid pulses without delay and no carotid bruits  Chest: clear to auscultation, no signs of consolidation by percussion or palpation, normal fremitus, symmetrical and full respiratory excursions, the pacemaker site looks healthy  Cardiovascular: normal position and quality of the apical impulse, regular rhythm, normal first and second heart sounds, no murmurs, rubs, S4 gallop is heard  Abdomen: no tenderness or distention, no masses by palpation, no abnormal pulsatility or arterial bruits, normal bowel  sounds, no hepatosplenomegaly  Extremities: no clubbing, cyanosis or edema; 2+ radial, ulnar and brachial pulses bilaterally; 2+ right femoral, posterior tibial and  dorsalis pedis pulses; 2+ left femoral, posterior tibial and dorsalis pedis pulses; no subclavian or femoral bruits  Neurological: grossly nonfocal   EKG:  EKG is not ordered today.    Recent Labs: 04/05/2014: ALT 27; Magnesium 2.0; TSH 0.684 04/06/2014: BUN 10; Creatinine 0.78; Hemoglobin 11.8*; Platelets 168; Potassium 3.5; Sodium 137    Lipid Panel    Component Value Date/Time   CHOL 144 04/06/2014 0136   TRIG 56 04/06/2014 0136   HDL 58 04/06/2014 0136   CHOLHDL 2.5 04/06/2014 0136   VLDL 11 04/06/2014 0136   LDLCALC 75 04/06/2014 0136      Wt Readings from Last 3 Encounters:  04/28/14 127 lb 9.6 oz (57.879 kg)  04/22/14 130 lb (58.968 kg)  04/12/14 133 lb (60.328 kg)      ASSESSMENT AND PLAN:  CORONARY ARTERY DISEASE  Last percutaneous revascularization was performed in 2012. Disease has generally been limited to the proximal LAD without other significant stenoses. Has preserved left ventricular systolic function. Very recent coronary angiogram shows no significant problems   HYPERTENSION  Unfortunately she had significant problems with orthostatic hypotension. The midodrine has prevented further syncope. Target systolic blood pressure is probably in the 150-160 range. Reminded not to take ProAmatine before lying down, not to take it at night.   Hyperlipidemia  Excellent lipid profile. The potential interaction between amiodarone and simvastatin is noted. She has been taking this combination of medicines for >3 years years without adverse events.   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 Very high prevalence of ventricular pacing despite lengthening the AV delay.  No detectable R-wave today. Should be considered pacemaker dependent. Her device is approaching ERI. Will need a generator change, I don't think she will need revision of the leads. Even if she should have atrial lead failure (unsure why the auto capture threshold was erroneous) one could make an argument for simple generator replacement in this 81 year old with multiple comorbidities, to avoid the risks of new lead placement. This was discussed in detail with the patient and her son today. Anticipate need for generator change the summer. Monthly remote pacemaker downloads for battery check  Atrial fibrillation Recently had more paroxysmal atrial fibrillation, but this has been asymptomatic and well rate controlled. Continue amiodarone along with periodic reevaluation of thyroid and liver function tests. Not a candidate for warfarin or even clopidogrel due to spontaneous retroperitoneal hematoma. Continue aspirin monotherapy.    Current medicines are reviewed at length with the patient today.  The patient does not have concerns regarding medicines.  The following changes have been made:  no change  Labs/ tests ordered today include:  No orders of the defined types were placed in this encounter.   Patient Instructions  Remote monitoring is used to monitor your PACEMAKER from home. This monitoring reduces the number of office visits required to check your device to one time per year. It allows [place] to keep an eye on the functioning of your device to ensure it is working properly. You are scheduled for a device check from home monthly. You may send your transmission at any time that day. If you have a wireless device, the transmission will be sent automatically. After your physician reviews your transmission, you will receive a postcard with your next transmission date.  Your physician recommends that you schedule a follow-up appointment in: 3 MONTHS WITH DR.Murl Golladay  with pacemaker check (MDT -  GRAY).    Joie Bimler, MD  07/29/2014 10:15 AM    Thurmon Fair, MD, [facility] [phone] office [phone] pager

## 2014-07-30 LAB — MDC_IDC_ENUM_SESS_TYPE_INCLINIC
Battery Impedance: 2380 Ohm
Battery Voltage: 2.67 V
Brady Statistic AS VP Percent: 0.8 %
Brady Statistic AS VS Percent: 2.5 %
Lead Channel Pacing Threshold Amplitude: 0.75 V
Lead Channel Pacing Threshold Pulse Width: 0.4 ms
Lead Channel Pacing Threshold Pulse Width: 0.4 ms
Lead Channel Setting Pacing Pulse Width: 0.4 ms
Lead Channel Setting Sensing Sensitivity: 2.8 mV
MDC IDC MSMT BATTERY REMAINING LONGEVITY: 5 mo
MDC IDC MSMT LEADCHNL RA IMPEDANCE VALUE: 495 Ohm
MDC IDC MSMT LEADCHNL RV IMPEDANCE VALUE: 833 Ohm
MDC IDC MSMT LEADCHNL RV PACING THRESHOLD AMPLITUDE: 1.25 V
MDC IDC SET LEADCHNL RA PACING AMPLITUDE: 2 V
MDC IDC SET LEADCHNL RV PACING AMPLITUDE: 2.5 V
MDC IDC STAT BRADY AP VP PERCENT: 89.7 %
MDC IDC STAT BRADY AP VS PERCENT: 7 %

## 2014-08-31 ENCOUNTER — Ambulatory Visit (INDEPENDENT_AMBULATORY_CARE_PROVIDER_SITE_OTHER): Payer: Medicare Other | Admitting: *Deleted

## 2014-08-31 DIAGNOSIS — Z950 Presence of cardiac pacemaker: Secondary | ICD-10-CM

## 2014-08-31 DIAGNOSIS — Z8679 Personal history of other diseases of the circulatory system: Secondary | ICD-10-CM

## 2014-09-01 NOTE — Progress Notes (Signed)
Remote pacemaker transmission.   

## 2014-09-02 ENCOUNTER — Encounter: Payer: Self-pay | Admitting: Cardiovascular Disease

## 2014-09-05 LAB — CUP PACEART REMOTE DEVICE CHECK
Battery Impedance: 2470 Ohm
Brady Statistic AP VP Percent: 100 %
Brady Statistic AP VS Percent: 0 %
Brady Statistic AS VS Percent: 0 %
Lead Channel Impedance Value: 549 Ohm
Lead Channel Impedance Value: 845 Ohm
Lead Channel Pacing Threshold Amplitude: 1.375 V
Lead Channel Pacing Threshold Pulse Width: 0.4 ms
Lead Channel Setting Pacing Amplitude: 2 V
Lead Channel Setting Pacing Amplitude: 2.75 V
MDC IDC MSMT BATTERY REMAINING LONGEVITY: 21 mo
MDC IDC MSMT BATTERY VOLTAGE: 2.74 V
MDC IDC SESS DTM: 20160531123254
MDC IDC SET LEADCHNL RV PACING PULSEWIDTH: 0.4 ms
MDC IDC SET LEADCHNL RV SENSING SENSITIVITY: 4 mV
MDC IDC STAT BRADY AS VP PERCENT: 0 %

## 2014-09-10 ENCOUNTER — Encounter: Payer: Self-pay | Admitting: Cardiology

## 2014-09-14 ENCOUNTER — Encounter: Payer: Self-pay | Admitting: Cardiovascular Disease

## 2014-09-21 ENCOUNTER — Ambulatory Visit: Payer: Medicare Other | Admitting: Neurology

## 2014-09-23 ENCOUNTER — Ambulatory Visit (INDEPENDENT_AMBULATORY_CARE_PROVIDER_SITE_OTHER): Payer: Medicare Other | Admitting: Neurology

## 2014-09-23 ENCOUNTER — Encounter: Payer: Self-pay | Admitting: Neurology

## 2014-09-23 VITALS — BP 92/57 | HR 79 | Temp 97.3°F | Ht 62.0 in

## 2014-09-23 DIAGNOSIS — I200 Unstable angina: Secondary | ICD-10-CM

## 2014-09-23 DIAGNOSIS — G20 Parkinson's disease: Secondary | ICD-10-CM

## 2014-09-23 DIAGNOSIS — I950 Idiopathic hypotension: Secondary | ICD-10-CM

## 2014-09-23 NOTE — Progress Notes (Signed)
[facility]   Provider: Dr Lucia Gaskins Referring Provider: Roderick Pee, MD Primary Care Physician: Evette Georges, MD  CC: Tremors  Interval update: Tremor may be a little better sometimes with the sinement. Son is there every day for lunch and dinner. He still has to help feed her due to the tremor. She is dizzy on standing, she is having spells of vertigo, she had dehydration and was given fluids. She is weaker since then because she was in bed for a week due to dehydration. However since getting fluids her dizziness is improved. The room starts spinning especially when she stands or transfers. Having difficulty getting her thoughts together sometimes. Dizziness/vertigo when they use the lift to place her in bed or transfer her. Her Bp has been very low. Today in the office it is between 80/ and 100/. Discussed that her low BP is affecting her dizziness. Sinemet may be helping but hesitate to increase as this may also lower blood pressure. She is on BP medications and discussed possibly lowering those or increasing midodrine, will discuss with her cardiologist as well as her physician at the nursing home.    Initial visit 06/24/2014: [name] Luna is a 81 y.o. female here as a referral from Dr. Tawanna Cooler for tremors  81 year old patient PMHx HTN, Afib, Syncope, orthostatic hypotension, gerd, UTI, pacemaker, macular degeneration, CAD, constipation, hypothyroid, HLD, Alzheimer's disease.   Here with son who provides most information. C/o leg weakness, tremors, falls. The weakness in the legs and tremors is the most concerning. Legs seem to be strong but seems to be a balance issue and patient is scared to walk due to falls and vision loss. She has fallen but not in the last year or two. Leg issue has been 3-4 years, progressing slowly. She shuffles. Son provides all information. Both hands have tremor. At times it is difficult to eat, she can't hold the fork due to the e. Son  has to feed her because she can't see and also because she has a tremor. She can't see her food. Tremor is interferring with the basic ADLs. She has had physical therapy, occupational therapy. She used to walk at home with a walker, gait progressively got worse and was more difficult to keep her from falling. She would just lose her balance. She has orthostatic hypotension and sometimes gets dizzy when she stands up, that seems to have stabilized some as far as he can tell. Has constipation. Tremor worse with anxiety/stress.   Reviewed notes, labs and imaging from outside physicians, which showed:   CT of the head:  Calvarium is intact. There is moderate diffuse atrophy. There is fairly severe low attenuation in the periventricular white matter diffusely. This is stable. There is no evidence of vascular territory infarct. There is no hemorrhage or extra-axial fluid. No evidence of mass or hydrocephalus  Personally reviewed and agree with findings.   CBC with mild anemia, GFR 71 otherwise largely unremarkable, TSH nml  Review of Systems: Patient complains of symptoms per HPI as well as the following symptoms: loss of vision, abdominal pain, constipation, incontinence of bladder, dizziness, speech difficulty, weakness, confusion, walking difficulty, wounds, trouble swallowing. Pertinent negatives per HPI. All others negative.   History   Social History  . Marital Status: Widowed    Spouse Name: N/A  . Number of Children: 2  . Years of Education: 12   Occupational History  . Not on file.   Social History Main Topics  .  Smoking status: Never Smoker   . Smokeless tobacco: Never Used  . Alcohol Use: No  . Drug Use: No  . Sexual Activity: No   Other Topics Concern  . Not on file   Social History Narrative   Lives at [facility] nursing [facility].    Caffeine use: very little    Family History  Problem Relation Age of Onset  . Sudden death Mother     Past Medical History    Diagnosis Date  . CHF (congestive heart failure)   . GERD (gastroesophageal reflux disease)   . Hypothyroidism   . Complete heart block     a. Bradycardia/CHB s/p Medtronic pacemaker 2009.  [name] Pancreatitis   . Coronary artery disease     a. DES to LAD 2008, 2011, 2012 (no disease in LM or RCA, mild luminal irreg Cx). b. Normal nuc 08/2011: no ischemia, EF 80%.  . Shingles 1980's    "twice; once on my front side; once on my back" (02/07/2012)  . Pacemaker   . PAF (paroxysmal atrial fibrillation)   . Tremor, essential   . S/P dilatation of esophageal stricture   . H/O hiatal hernia   . Neuromuscular disorder     essential tremors  . UTI (lower urinary tract infection) 08/03/2011  . Dizziness, near syncope   . Macular degeneration, dry     "both eyes" (02/07/2012)  . Hypertension   . Pneumonia 1927  . Syncope   . Orthostatic hypotension   . Iliopsoas muscle hematoma     a. Spontaneously 05/2013 while on aspirin/Plavix. Not a candidate for anticoagulation other than aspirin due to this.  . Hyperlipidemia   . Memory loss   . H/O valvular heart disease     a. 2D echo 2013: EF 55-60%, grade 2DD, mild MR, mild AI, mild-mod TR, PASP .    Past Surgical History  Procedure Laterality Date  . Repair spigelian hernia    . S/p childbirth      x2  . Cataract extraction w/ intraocular lens  implant, bilateral  ~ 1997  . Vaginal hysterectomy  1970's  . Left oophorectomy  1970's  . Insert / replace / remove pacemaker  2000; 2009    initial placement; replaced  . Appendectomy  1933  . Cholecystectomy  1950's  . Hernia repair    . Esophageal dilation      "once" (02/07/2012)  . Cardiac catheterization  12/24/2005    No intervention - recommend medical therapy  . Cardiac catheterization  10/13/2006    80% proximal LAD stenosis, stented w/ a 3.5x39mm Cypher stent at 14atm (3.21mm), resulting in reduction of 80% to 0% residual.  . Cardiac catheterization  11/14/2006    No intervention -  recommend medical therapy  . Cardiac catheterization  07/24/2007    No intervention.  . Cardiac catheterization  01/16/2010    75% proximal LAD stenosis w/ in-stent restenosis. No intervention. Dr Allyson Sabal will intervene on proximal LAD lesion.  . Cardiac catheterization  01/16/2010    75% proximal LAD stenosis, stented w/ a 3x74mm Promus DES at 16atm, resulting in reduction 75% to 0% residual  . Cardioversion  07/11/2010    Successful conversion to V paced verified via pacemaker.  . Cardiac catheterization  09/08/2010    LAD 75% in-stent restenosis, stented w/ a 3x57mm Resolute DES at 16atm (3.40mm), resulting in reduction of 75% stenosis  . Renal doppler  12/12/2010    Bilat proximal renal 1-59% diameter reduction. Bilat kidneys are  normal.  . Cardiac catheterization  12/22/2010    No intervention - recommend medical therapy.  . Cardiovascular stress test  08/09/2011    No scintigraphic evidence of inducible myocardial ischemia. Non-diagnostic for ischemia.  . Transthoracic echocardiogram  01/21/2012    EF 55-60%, mild-moderate tricuspid regurg.  [name] Percutaneous coronary stent intervention (pci-s)  04/06/2014    Procedure: PERCUTANEOUS CORONARY STENT INTERVENTION (PCI-S);  Surgeon: Peter M Swaziland, MD;  Location: [facility] CATH LAB;  Service: Cardiovascular;;  . Cardiac catheterization  04/06/2014    Procedure: LEFT HEART CATH AND CORONARY ANGIOGRAPHY;  Surgeon: Peter M Swaziland, MD;  Location: [facility] CATH LAB;  Service: Cardiovascular;;    Current Outpatient Prescriptions  Medication Sig Dispense Refill  . acetaminophen (TYLENOL) 325 MG tablet Take 325 mg by mouth 3 (three) times daily.     [name] amiodarone (PACERONE) 200 MG tablet TAKE ONE TABLET BY MOUTH ONCE DAILY 30 tablet 11  . aspirin 81 MG EC tablet Take 81 mg by mouth daily.      . bisacodyl (DULCOLAX) 5 MG EC tablet Take 5 mg by mouth 2 (two) times daily.    . carbidopa-levodopa (SINEMET) 25-100 MG per tablet Take 0.5 tablets by mouth 3 (three) times daily.  1/2 tablet po at bedtime. Can repeat additional 1/2 tablet later in evening as needed 90 tablet 3  . divalproex (DEPAKOTE) 125 MG DR tablet Take 125 mg by mouth at bedtime.    [name] levothyroxine (SYNTHROID, LEVOTHROID) 100 MCG tablet TAKE ONE TABLET BY MOUTH ONCE DAILY BEFORE BREAKFAST 90 tablet 0  . loratadine (CLARITIN) 10 MG tablet Take 10 mg by mouth daily.      [name] losartan (COZAAR) 50 MG tablet Take 1 tablet (50 mg total) by mouth 2 (two) times daily. 180 tablet 3  . meclizine (ANTIVERT) 25 MG tablet Take 25 mg by mouth 3 (three) times daily as needed for dizziness.    . metoprolol (LOPRESSOR) 25 MG tablet Take 2 tablets (50 mg total) by mouth 2 (two) times daily. 60 tablet 6  . midodrine (PROAMATINE) 2.5 MG tablet Take 1 tablet (2.5 mg total) by mouth daily before breakfast. 30 tablet 11  . nitroGLYCERIN (NITROSTAT) 0.4 MG SL tablet Place 0.4 mg under the tongue every 5 (five) minutes as needed for chest pain.     . Nutritional Supplements (NUTRITIONAL SHAKE PO) Take 8 oz by mouth 2 (two) times daily. 8 oz in am and 8oz in pm ([facility] Pass)    . omeprazole (PRILOSEC) 20 MG capsule Take 20 mg by mouth 2 (two) times daily.    . ondansetron (ZOFRAN) 4 MG tablet Take 4 mg by mouth 3 (three) times daily as needed for nausea or vomiting.    . promethazine (PHENERGAN) 12.5 MG tablet Take 12.5 mg by mouth 3 (three) times daily as needed for nausea or vomiting.    [name] QUEtiapine (SEROQUEL) 25 MG tablet Take 25 mg by mouth every morning. Q am and 1 tab QPM for psychosis    . ranolazine (RANEXA) 500 MG 12 hr tablet Take 1 tablet (500 mg total) by mouth daily. 30 tablet 11  . simvastatin (ZOCOR) 20 MG tablet Take 1 tablet (20 mg total) by mouth at bedtime. 30 tablet 0  . traMADol (ULTRAM) 50 MG tablet Take 0.5 tablets (25 mg total) by mouth at bedtime. (Patient taking differently: Take 25 mg by mouth 3 (three) times daily as needed. ) 30 tablet 0  . trimethoprim (TRIMPEX) 100 MG tablet Take  1 tablet (100 mg total)  by mouth at bedtime. Do not take while on Keflex (cefazolin). (Patient taking differently: Take 100 mg by mouth daily. Do not take while on Keflex (cefazolin).)     No current facility-administered medications for this visit.    Allergies as of 09/23/2014 - Review Complete 07/29/2014  Allergen Reaction Noted  . Codeine Other (See Comments) 11/12/2006  . Imdur [isosorbide] Other (See Comments) 05/02/2012  . Isosorbide mononitrate Other (See Comments) 07/29/2007  . Lorazepam Other (See Comments) 11/12/2006  . Prednisone Nausea And Vomiting 11/12/2006  . Penicillins Rash 11/12/2006  . Valium [diazepam] Other (See Comments) 11/08/2011    Vitals: BP 92/57 mmHg  Pulse 79  Temp(Src) 97.3 F (36.3 C)  Ht 5\' 2"  (1.575 m) Last Weight:  Wt Readings from Last 1 Encounters:  04/28/14 127 lb 9.6 oz (57.879 kg)   Last Height:   Ht Readings from Last 1 Encounters:  09/23/14 5\' 2"  (1.575 m)     Gen: NAD, not conversant  CV: RRR. No Carotid Bruits. No peripheral edema, warm, nontender Eyes: Conjunctivae clear without exudates or hemorrhage  Neuro: Detailed Neurologic Exam  Speech:  No aphasia, no dysarthria, can answer questions appropriately Cognition:  The patient is oriented to person.   recent and remote memory impaired;   language without aphasia, dysarthira;   normal attention, concentration,   fund of knowledge Cranial Nerves:  The pupils are equal, round, and reactive to light. Attempted to visualize fundi, cannot due to small pupils. VF limited but grossly intact to finger waving. . Impaired upgaze. Trigeminal sensation is intact and the muscles of mastication are normal. The face is symmetric. The palate elevates in the midline. Hearing impaired. Voice is normal. Shoulder shrug is normal. The tongue has normal motion without fasciculations.   Coordination:  No dysmteria on FTN, difficulty with HTS   Gait:attempted but she became  dizzy    Motor Observation:  Left > right resting tremor with intention and postural components Tone:  Increased tone with cogwheeling, axial rigidity  Posture:  Posture is normal in wheel chair   Strength:  Strength is 4/5 in the uppers, 4+/5 in the lowers   Sensation: intact to LT   Reflex Exam:  DTR's:  Paratonia  Toes:  The toes are equivocal bilaterally.  Clonus:  Clonus is absent.     Assessment/Plan: 81 year old patient PMHx HTN, Afib, Syncope, orthostatic hypotension, gerd, UTI, pacemaker, macular degeneration, CAD, constipation, hypothyroid, HLD, Alzheimer's disease.   Here with son who provides most information. C/o gait difficulty, balance problems, tremors, falls, worsening dizziness and vertigo. The weakness in the legs and tremors is the most concerning. Slowly progressive. Lives in a nursing home. She shuffles per son when she walks, could not fully evaluate gait in office, she is more wheelchair bound recently due to dizziness and hypotension. Son provides all information.. At times it is difficult to eat, perform ADLs due to tremor.  Exam significant for increased extremity and axial tone with cogwheeling, bilateral resting tremor with intention and postural components, impaired upgaze. Consistent with Parkinsonism. May be idiopathic parkinson's disease however history of significant falls as presenting symptom suspect for parkinson's plus syndrome. Discussed with patient, at this point expectations have to be managed given age, dementia, significant vision loss, and significant hypotention (today vitals as low as systolic 80. Can try Sinemet low dose to see if tremor improves but warned this can cause side effects such as orthostatic hypotension which patient already  has. If this is a parkinson plus syndrome, sinemet may not help. Son understands. Can try Sinemet 1/2 tablet tid with close supervision. If we can adjust BP medications and  let BP elevate,we may be able to use more sinemet  Will call Dr. Royann Shivers regarding decreasing blood pressure medications.  If we can adjust BP medications and let BP elevate,we may be able to use more sinemet but hesitate at this time because of systolic 80s today. She is also increasingly dizzy with vertigo on transferring likely due to hypotension.   [phone] - son  Naomie Dean, MD  [facility] [address] [place], [place] [address]  Phone [phone] Fax [phone]  A total of 30 minutes was spent face-to-face with this patient. Over half this time was spent on counseling patient on the PD and dizziness diagnosis and different diagnostic and therapeutic options available.

## 2014-09-24 ENCOUNTER — Telehealth: Payer: Self-pay | Admitting: Neurology

## 2014-09-24 NOTE — Telephone Encounter (Signed)
[name] Luna, would you Please call patient's son and let him know I have left a message for Dr. Royann Shivers and will call back son as soon I speak with him about blood pressure management. Thanks. Son's number [phone]

## 2014-09-27 ENCOUNTER — Telehealth: Payer: Self-pay | Admitting: *Deleted

## 2014-09-27 ENCOUNTER — Other Ambulatory Visit: Payer: Self-pay

## 2014-09-27 MED ORDER — LOSARTAN POTASSIUM 50 MG PO TABS: 50.0000 mg | ORAL_TABLET | Freq: Every day | ORAL | Status: DC

## 2014-09-27 NOTE — Telephone Encounter (Signed)
Spoke with Son to let him know Dr. Lucia GaskinsAhern left a message for Dr. Royann Shiversroitoru and will call him when she hears back about blood pressure management. Peyton NajjarLarry stated "Dr. Royann Shiversroitoru called her nursing home and had them cut back on one of her blood pressure pills". Son did not know which pill it was. I told him I would let Dr. Lucia GaskinsAhern know and we would call him back if needed. He verbalized understanding.

## 2014-09-27 NOTE — Telephone Encounter (Signed)
[facility] [employer], RN, notified to decrease Losartan to 50mg  HS.  If SBP is <100 sitting or standing after 4-5 days stop it altogether.

## 2014-09-27 NOTE — Telephone Encounter (Signed)
-----   Message from Thurmon FairMihai Croitoru, MD sent at 09/27/2014  8:38 AM EDT ----- Yes, I understand. I think we can try to stop her losartan altogether, will start by reducing to once daily. Britta MccreedyBarbara, can you please contact her and tell her to take losartan 50 mg at bedtime daily. If BP still low in 4-5 days (SBP<100 sitting or standing), stop altogether. MCr ----- Message -----    From: Anson FretAntonia B Ahern, MD    Sent: 09/26/2014   6:23 PM      To: Thurmon FairMihai Croitoru, MD  Hi Dr. Royann Shiversroitoru - I am the neurologist seeing Ms. Vanauken. Her BP was very low in my office the other day, as low as systolic 80. She is having dizziness and vertigo when transferring, I think it is at least partially due to her BP. I would also like to increase her Sinemet for her Parkinson's tremor but hesitate as this can further lower BP.  You have her on blood pressure medications, is there any way you could make adjustments so we can get her BP higher? I didn't want to change anything without consulting you.  Please let me know, thanks so much!  Artemio Alyoni Ahern, MD [facility]

## 2014-09-27 NOTE — Telephone Encounter (Signed)
I need son to call me in a week and let me know how her blood pressure is running and how her symptoms are doing. Ask him to get some blood pressure readings every day and call me in a week. Then we can decide if we can increase her other medications. thanks

## 2014-09-27 NOTE — Telephone Encounter (Signed)
Called and spoke with son to let him know to monitor her BP for the next week and symptoms and to call in the next week to let us know. [facility]. Peyton NajjarLarry verbalized understanding.

## 2014-10-05 ENCOUNTER — Telehealth: Payer: Self-pay | Admitting: Neurology

## 2014-10-05 NOTE — Telephone Encounter (Signed)
Spoke with [name] Luna (son) who stated BP was: 6/28- 118/64 6/29- 126/70 6/30- 120/60 7/1- 160/88  7/2- 160/78 7/3-140/76 7/4- BP dropped and she almost passed out. Stated her BP was  80/?. He could not remember. He knows they came back and checked her BP and it was 90/50's. This was the first episode he knows of that she almost fainted again.   Advised him to verify that her BP was being checked before her BP medication was given and that I will let Dr. Lucia GaskinsAhern know about findings and symptoms. He stated she has a cardiologist appt on 10/12/14. Told him to call back with any questions. He verbalized understanding.

## 2014-10-05 NOTE — Telephone Encounter (Signed)
Patients son called and requested to speak with the nurse regarding the patients blood pressure this past week. Please call and advise.

## 2014-10-06 NOTE — Telephone Encounter (Signed)
Spoke to son, will discuss further management after his mother's cardiology appointment

## 2014-10-12 ENCOUNTER — Ambulatory Visit (INDEPENDENT_AMBULATORY_CARE_PROVIDER_SITE_OTHER): Payer: Medicare Other | Admitting: Cardiovascular Disease

## 2014-10-12 ENCOUNTER — Encounter: Payer: Self-pay | Admitting: Cardiovascular Disease

## 2014-10-12 VITALS — BP 106/67 | HR 143 | Ht 62.0 in

## 2014-10-12 DIAGNOSIS — Z79899 Other long term (current) drug therapy: Secondary | ICD-10-CM | POA: Diagnosis not present

## 2014-10-12 DIAGNOSIS — I200 Unstable angina: Secondary | ICD-10-CM | POA: Diagnosis not present

## 2014-10-12 DIAGNOSIS — E785 Hyperlipidemia, unspecified: Secondary | ICD-10-CM | POA: Diagnosis not present

## 2014-10-12 DIAGNOSIS — Z950 Presence of cardiac pacemaker: Secondary | ICD-10-CM

## 2014-10-12 DIAGNOSIS — I480 Paroxysmal atrial fibrillation: Secondary | ICD-10-CM | POA: Diagnosis not present

## 2014-10-12 DIAGNOSIS — Z8679 Personal history of other diseases of the circulatory system: Secondary | ICD-10-CM

## 2014-10-12 DIAGNOSIS — G20C Parkinsonism, unspecified: Secondary | ICD-10-CM

## 2014-10-12 DIAGNOSIS — G20 Parkinson's disease: Secondary | ICD-10-CM

## 2014-10-12 DIAGNOSIS — G903 Multi-system degeneration of the autonomic nervous system: Secondary | ICD-10-CM | POA: Diagnosis not present

## 2014-10-12 DIAGNOSIS — I10 Essential (primary) hypertension: Secondary | ICD-10-CM

## 2014-10-12 LAB — CUP PACEART INCLINIC DEVICE CHECK
Battery Impedance: 2468 Ohm
Battery Voltage: 2.65 V
Brady Statistic AP VP Percent: 81 %
Brady Statistic AS VP Percent: 3 %
Lead Channel Impedance Value: 495 Ohm
Lead Channel Pacing Threshold Amplitude: 1 V
Lead Channel Pacing Threshold Pulse Width: 0.4 ms
Lead Channel Pacing Threshold Pulse Width: 0.4 ms
Lead Channel Setting Pacing Amplitude: 2.75 V
Lead Channel Setting Pacing Pulse Width: 0.4 ms
Lead Channel Setting Sensing Sensitivity: 4 mV
MDC IDC MSMT BATTERY REMAINING LONGEVITY: 23 mo
MDC IDC MSMT LEADCHNL RV IMPEDANCE VALUE: 859 Ohm
MDC IDC MSMT LEADCHNL RV PACING THRESHOLD AMPLITUDE: 1.25 V
MDC IDC SESS DTM: 20160712133817
MDC IDC SET LEADCHNL RA PACING AMPLITUDE: 2 V
MDC IDC STAT BRADY AP VS PERCENT: 2 %
MDC IDC STAT BRADY AS VS PERCENT: 14 %

## 2014-10-12 LAB — COMPREHENSIVE METABOLIC PANEL
ALBUMIN: 3.2 g/dL — AB (ref 3.5–5.2)
ALT: 10 U/L (ref 0–35)
AST: 30 U/L (ref 0–37)
Alkaline Phosphatase: 242 U/L — ABNORMAL HIGH (ref 39–117)
BUN: 14 mg/dL (ref 6–23)
CALCIUM: 8.8 mg/dL (ref 8.4–10.5)
CO2: 27 meq/L (ref 19–32)
Chloride: 104 mEq/L (ref 96–112)
Creat: 0.8 mg/dL (ref 0.50–1.10)
GLUCOSE: 95 mg/dL (ref 70–99)
POTASSIUM: 4.4 meq/L (ref 3.5–5.3)
Sodium: 142 mEq/L (ref 135–145)
Total Bilirubin: 0.4 mg/dL (ref 0.2–1.2)
Total Protein: 5.9 g/dL — ABNORMAL LOW (ref 6.0–8.3)

## 2014-10-12 LAB — TSH: TSH: 1.255 u[IU]/mL (ref 0.350–4.500)

## 2014-10-12 NOTE — Patient Instructions (Addendum)
Medication Instructions:   STOP LOSARTAN  Your physician recommends that you return for lab work in: TODAY AT [facility].    Follow-Up:  Remote monitoring is used to monitor your Pacemaker or ICD from home. This monitoring reduces the number of office visits required to check your device to one time per year. It allows us to monitor the functioning of your device to ensure it is working properly. You are scheduled for a device check from home on January 13, 2015. You may send your transmission at any time that day. If you have a wireless device, the transmission will be sent automatically. After your physician reviews your transmission, you will receive a postcard with your next transmission date.   Dr. Royann Shiversroitoru recommends that you schedule a follow-up appointment in: ONE YEAR.

## 2014-10-12 NOTE — Progress Notes (Signed)
Patient ID: [name] HarrisonWinifred I Luna, female   DOB: 08/14/1920, 81 y.o.   MRN: [id number]     Cardiology Office Note   Date:  10/12/2014   ID:  [name] HarrisonWinifred I Luna, DOB 08/14/1920, MRN [id number]  PCP:  Evette GeorgesDD,JEFFREY ALLEN, MD  Cardiologist:   Thurmon FairROITORU,Quintara Bost, MD   Chief Complaint  Patient presents with  . 3 months    Patient feels light headed, dizzy, and SOB.      History of Present Illness: [name] Luna is a 81 y.o. female who presents for pacemaker f/u, CHB (pacemaker dependent), PAFib and CAD.  [name] Luna is a 81 y/o F with history of CAD (DES to LAD 2008 -2011 and 2012, normal nuc 08/2011 with EF 80%, Coronary angiography in January 2016 showed non-obstructive disease.), PAF (not a candidate for anticoag other than aspirin due to h/o spontaneous iliopsoas hematoma 05/2013 while on ASA/Plavix), GERD, esophageal stricture s/p dilatation, CHB s/p MDT pacemaker 2009, essential tremor, macular degeneration, and syncope/orthostatic hypotension (BPs allowed to run higher due to this). Other problems include hypothyroidism, HLD , Parkinson's Plus sd. and Alzheimer's disease.   [name] Luna is still a full-time resident at [facility]. With every visit [name] Luna appears to be less communicative and more withdrawn. It appears her problems of dementia are progressive. Although [name] Luna has not had falls [name] Luna has substantial problems with hypotension, even after reduction in the dose of her losartan. [name] Luna is now receiving Midodrine.  [name] Luna has not had angina pectoris. He denies dyspnea but is extremely inactive. Her Medtronic versa dual-chamber device implanted in 2009 is generally functioning normally, but her atrial outputs automatically increased to 5 V at 1 ms pulse width due to some atrial under sensing. Optic auto capture was turned off today. Manual pacing threshold testing showed a very good pacing threshold. [name] Luna has 100% atrial pacing (1 not in atrial fibrillation) and 100% ventricular pacing due to sinus node arrest  and complete heart block. [name] Luna is pacemaker dependent in both chambers. After the appropriate changes in atrial outputs her device longevity is now estimated to be 7-38 months.  The overall burden of paroxysmal atrial fibrillation is 6.8% with clustering of events in February and mid June. Ventricular rate control was good.  Past Medical History  Diagnosis Date  . CHF (congestive heart failure)   . GERD (gastroesophageal reflux disease)   . Hypothyroidism   . Complete heart block     a. Bradycardia/CHB s/p Medtronic pacemaker 2009.  [name]. Pancreatitis   . Coronary artery disease     a. DES to LAD 2008, 2011, 2012 (no disease in LM or RCA, mild luminal irreg Cx). b. Normal nuc 08/2011: no ischemia, EF 80%.  . Shingles 1980's    "twice; once on my front side; once on my back" (02/07/2012)  . Pacemaker   . PAF (paroxysmal atrial fibrillation)   . Tremor, essential   . S/P dilatation of esophageal stricture   . H/O hiatal hernia   . Neuromuscular disorder     essential tremors  . UTI (lower urinary tract infection) 08/03/2011  . Dizziness, near syncope   . Macular degeneration, dry     "both eyes" (02/07/2012)  . Hypertension   . Pneumonia 1927  . Syncope   . Orthostatic hypotension   . Iliopsoas muscle hematoma     a. Spontaneously 05/2013 while on aspirin/Plavix. Not a candidate for anticoagulation other than aspirin due to this.  . Hyperlipidemia   . Memory loss   . H/O  valvular heart disease     a. 2D echo 2013: EF 55-60%, grade 2DD, mild MR, mild AI, mild-mod TR, PASP .    Past Surgical History  Procedure Laterality Date  . Repair spigelian hernia    . S/p childbirth      x2  . Cataract extraction w/ intraocular lens  implant, bilateral  ~ 1997  . Vaginal hysterectomy  1970's  . Left oophorectomy  1970's  . Insert / replace / remove pacemaker  2000; 2009    initial placement; replaced  . Appendectomy  1933  . Cholecystectomy  1950's  . Hernia repair    . Esophageal  dilation      "once" (02/07/2012)  . Cardiac catheterization  12/24/2005    No intervention - recommend medical therapy  . Cardiac catheterization  10/13/2006    80% proximal LAD stenosis, stented w/ a 3.5x98mm Cypher stent at 14atm (3.68mm), resulting in reduction of 80% to 0% residual.  . Cardiac catheterization  11/14/2006    No intervention - recommend medical therapy  . Cardiac catheterization  07/24/2007    No intervention.  . Cardiac catheterization  01/16/2010    75% proximal LAD stenosis w/ in-stent restenosis. No intervention. Dr Allyson Sabal will intervene on proximal LAD lesion.  . Cardiac catheterization  01/16/2010    75% proximal LAD stenosis, stented w/ a 3x51mm Promus DES at 16atm, resulting in reduction 75% to 0% residual  . Cardioversion  07/11/2010    Successful conversion to V paced verified via pacemaker.  . Cardiac catheterization  09/08/2010    LAD 75% in-stent restenosis, stented w/ a 3x3mm Resolute DES at 16atm (3.66mm), resulting in reduction of 75% stenosis  . Renal doppler  12/12/2010    Bilat proximal renal 1-59% diameter reduction. Bilat kidneys are normal.  . Cardiac catheterization  12/22/2010    No intervention - recommend medical therapy.  . Cardiovascular stress test  08/09/2011    No scintigraphic evidence of inducible myocardial ischemia. Non-diagnostic for ischemia.  . Transthoracic echocardiogram  01/21/2012    EF 55-60%, mild-moderate tricuspid regurg.  [name] Percutaneous coronary stent intervention (pci-s)  04/06/2014    Procedure: PERCUTANEOUS CORONARY STENT INTERVENTION (PCI-S);  Surgeon: Peter M Swaziland, MD;  Location: [facility] CATH LAB;  Service: Cardiovascular;;  . Cardiac catheterization  04/06/2014    Procedure: LEFT HEART CATH AND CORONARY ANGIOGRAPHY;  Surgeon: Peter M Swaziland, MD;  Location: [facility] CATH LAB;  Service: Cardiovascular;;     Current Outpatient Prescriptions  Medication Sig Dispense Refill  . acetaminophen (TYLENOL) 325 MG tablet Take 325 mg by mouth 3  (three) times daily.     [name] amiodarone (PACERONE) 200 MG tablet TAKE ONE TABLET BY MOUTH ONCE DAILY 30 tablet 11  . aspirin 81 MG EC tablet Take 81 mg by mouth daily.      . bisacodyl (DULCOLAX) 5 MG EC tablet Take 5 mg by mouth 2 (two) times daily.    . carbidopa-levodopa (SINEMET) 25-100 MG per tablet Take 0.5 tablets by mouth 3 (three) times daily. 1/2 tablet po at bedtime. Can repeat additional 1/2 tablet later in evening as needed 90 tablet 3  . divalproex (DEPAKOTE) 125 MG DR tablet Take 125 mg by mouth at bedtime.    [name] levothyroxine (SYNTHROID, LEVOTHROID) 100 MCG tablet TAKE ONE TABLET BY MOUTH ONCE DAILY BEFORE BREAKFAST 90 tablet 0  . loratadine (CLARITIN) 10 MG tablet Take 10 mg by mouth daily.      . meclizine (ANTIVERT) 25 MG tablet  Take 25 mg by mouth 3 (three) times daily as needed for dizziness.    . metoprolol (LOPRESSOR) 25 MG tablet Take 2 tablets (50 mg total) by mouth 2 (two) times daily. 60 tablet 6  . midodrine (PROAMATINE) 2.5 MG tablet Take 1 tablet (2.5 mg total) by mouth daily before breakfast. 30 tablet 11  . nitroGLYCERIN (NITROSTAT) 0.4 MG SL tablet Place 0.4 mg under the tongue every 5 (five) minutes as needed for chest pain.     . Nutritional Supplements (NUTRITIONAL SHAKE PO) Take 8 oz by mouth 2 (two) times daily. 8 oz in am and 8oz in pm (Med Pass)    . omeprazole (PRILOSEC) 20 MG capsule Take 20 mg by mouth 2 (two) times daily.    . ondansetron (ZOFRAN) 4 MG tablet Take 4 mg by mouth 3 (three) times daily as needed for nausea or vomiting.    . promethazine (PHENERGAN) 12.5 MG tablet Take 12.5 mg by mouth 3 (three) times daily as needed for nausea or vomiting.    [name] QUEtiapine (SEROQUEL) 25 MG tablet Take 25 mg by mouth every morning. Q am and 1 tab QPM for psychosis    . ranolazine (RANEXA) 500 MG 12 hr tablet Take 1 tablet (500 mg total) by mouth daily. 30 tablet 11  . simvastatin (ZOCOR) 20 MG tablet Take 1 tablet (20 mg total) by mouth at bedtime. 30 tablet 0    . traMADol (ULTRAM) 50 MG tablet Take 0.5 tablets (25 mg total) by mouth at bedtime. (Patient taking differently: Take 25 mg by mouth 3 (three) times daily as needed. ) 30 tablet 0  . trimethoprim (TRIMPEX) 100 MG tablet Take 1 tablet (100 mg total) by mouth at bedtime. Do not take while on Keflex (cefazolin). (Patient taking differently: Take 100 mg by mouth daily. Do not take while on Keflex (cefazolin).)     No current facility-administered medications for this visit.    Allergies:   Codeine; Imdur; Isosorbide mononitrate; Lorazepam; Prednisone; Penicillins; and Valium    Social History:  The patient  reports that [name] Luna has never smoked. [name] Luna has never used smokeless tobacco. [name] Luna reports that [name] Luna does not drink alcohol or use illicit drugs.   Family History:  The patient's family history includes Sudden death in her mother.    ROS:  Please see the history of present illness.    Otherwise, review of systems positive for dizziness.   All other systems are reviewed and negative.    PHYSICAL EXAM: VS:  BP 106/67 mmHg  Pulse 143  Ht 5\' 2"  (1.575 m)  Wt  , BMI There is no weight on file to calculate BMI.  General: Alert, oriented x3, no distress , very quiet Head: no evidence of trauma, PERRL, EOMI, no exophtalmos or lid lag, no myxedema, no xanthelasma; normal ears, nose and oropharynx  Neck: normal jugular venous pulsations and no hepatojugular reflux; brisk carotid pulses without delay and no carotid bruits  Chest: clear to auscultation, no signs of consolidation by percussion or palpation, normal fremitus, symmetrical and full respiratory excursions, the pacemaker site looks healthy  Cardiovascular: normal position and quality of the apical impulse, regular rhythm, normal first and second heart sounds, no murmurs, rubs, S4 gallop is heard  Abdomen: no tenderness or distention, no masses by palpation, no abnormal pulsatility or arterial bruits, normal bowel sounds, no  hepatosplenomegaly  Extremities: no clubbing, cyanosis or edema; 2+ radial, ulnar and brachial pulses bilaterally; 2+ right femoral, posterior tibial and  dorsalis pedis pulses; 2+ left femoral, posterior tibial and dorsalis pedis pulses; no subclavian or femoral bruits  Neurological: grossly nonfocal  EKG:  EKG is ordered today. The ekg ordered today demonstrates AV sequential pacing   Recent Labs: 04/05/2014: ALT 27; Magnesium 2.0; TSH 0.684 04/06/2014: BUN 10; Creatinine, Ser 0.78; Hemoglobin 11.8*; Platelets 168; Potassium 3.5; Sodium 137    Lipid Panel    Component Value Date/Time   CHOL 144 04/06/2014 0136   TRIG 56 04/06/2014 0136   HDL 58 04/06/2014 0136   CHOLHDL 2.5 04/06/2014 0136   VLDL 11 04/06/2014 0136   LDLCALC 75 04/06/2014 0136      Wt Readings from Last 3 Encounters:  04/28/14 127 lb 9.6 oz (57.879 kg)  04/22/14 130 lb (58.968 kg)  04/12/14 133 lb (60.328 kg)    .   ASSESSMENT AND PLAN:  CORONARY ARTERY DISEASE  Last percutaneous revascularization was performed in 2012. Disease has generally been limited to the proximal LAD without other significant stenoses. Has preserved left ventricular systolic function. Very recent coronary angiogram shows no significant problems. Thankfully [name] Luna currently has no angina pectoris. Would like to continue beta blockers and nitrates if possible, but these may have to be stopped if [name] Luna has echo tension.   HYPERTENSION  Unfortunately [name] Luna hsd significant problems with orthostatic hypotension. The midodrine has prevented further syncope. Target systolic blood pressure is probably in the 150-160 range. Reminded not to take ProAmatine before lying down, not to take it at night. Stop losartan try to continue beta blocker.  Hyperlipidemia  Excellent lipid profile. The potential interaction between amiodarone and simvastatin is noted. [name] Luna has been taking this combination of medicines for >3 years years without adverse events.    Sinus node arrest and intermittent complete heart block status post dual chamber Medtronic pacemaker [name] Luna has a history of both sinus node arrest and paroxysmal complete heart block. Very high prevalence of ventricular pacing despite lengthening the AV delay. No detectable R-wave today. Pacemaker dependent. Her device is approaching ERI. Will need a generator change, I don't think [name] Luna will need revision of the leads. Even if [name] Luna should have atrial lead failure (unsure why the auto capture threshold was erroneous) one could make an argument for simple generator replacement in this 81 year old with multiple comorbidities, to avoid the risks of new lead placement. This was discussed in detail with the patient and her son today. Anticipate need for generator change later this year. 3 months remote pacemaker downloads for battery check start monthly checks again once battery longevity decreases  Atrial fibrillation Recently had more paroxysmal atrial fibrillation, but this has been asymptomatic and well rate controlled. Continue amiodarone along with periodic reevaluation of thyroid and liver function tests. Not a candidate for warfarin or even clopidogrel due to spontaneous retroperitoneal hematoma. Continue aspirin monotherapy.     Current medicines are reviewed at length with the patient today.  The patient does not have concerns regarding medicines.  The following changes have been made:  Stop losartan  Labs/ tests ordered today include:  Orders Placed This Encounter  Procedures  . Comprehensive metabolic panel  . TSH  . Implantable device check  . EKG 12-Lead      Patient Instructions  Medication Instructions:   STOP LOSARTAN  Your physician recommends that you return for lab work in: TODAY AT [facility].    Follow-Up:  Remote monitoring is used to monitor your Pacemaker or ICD from home. This monitoring reduces the number of office  visits required to check your device to  one time per year. It allows [place] to monitor the functioning of your device to ensure it is working properly. You are scheduled for a device check from home on January 13, 2015. You may send your transmission at any time that day. If you have a wireless device, the transmission will be sent automatically. After your physician reviews your transmission, you will receive a postcard with your next transmission date.   Dr. Royann Shivers recommends that you schedule a follow-up appointment in: ONE YEAR.           Joie Bimler, MD  10/12/2014 4:51 PM    Thurmon Fair, MD, [facility] [phone] office [phone] pager

## 2014-10-13 ENCOUNTER — Encounter: Payer: Self-pay | Admitting: Neurology

## 2014-10-25 ENCOUNTER — Encounter: Payer: Self-pay | Admitting: *Deleted

## 2014-10-25 ENCOUNTER — Other Ambulatory Visit: Payer: Self-pay | Admitting: Neurology

## 2014-10-25 ENCOUNTER — Encounter: Payer: Self-pay | Admitting: Neurology

## 2014-10-25 NOTE — Progress Notes (Signed)
Faxed signed order from Dr. Lucia Gaskins for checking BP 2x/day for a week. Asked to fax results to Dr. Lucia Gaskins at [phone]. Faxed to [phone]. Received fax confirmation.

## 2014-11-21 ENCOUNTER — Other Ambulatory Visit: Payer: Self-pay | Admitting: Neurology

## 2014-11-21 ENCOUNTER — Telehealth: Payer: Self-pay | Admitting: Neurology

## 2014-11-21 MED ORDER — CARBIDOPA-LEVODOPA 25-100 MG PO TABS: 1.0000 | ORAL_TABLET | Freq: Three times a day (TID) | ORAL | Status: AC

## 2014-11-21 MED ORDER — CARBIDOPA-LEVODOPA 25-100 MG PO TABS: 1.0000 | ORAL_TABLET | Freq: Three times a day (TID) | ORAL | Status: DC

## 2014-11-21 NOTE — Telephone Encounter (Signed)
[name] Luna - please call patient's son and let him know that I would like to increase patient's carbidopa/levodopa since her recent blood pressures have improved. I will increase to one tab three times a day. Please fax the prescription to her nursing facility. Thank you.

## 2014-11-22 NOTE — Telephone Encounter (Signed)
Spoke w/ son, Peyton Najjar and told him that Dr. Lucia Gaskins reviewed pt BP and wants to increase BP medication (Sinimet) to one tablet 3 times per day. I will fax order to [facility]. He verbalized understanding.

## 2014-11-22 NOTE — Telephone Encounter (Signed)
Faxed signed Rx for Sinemet 25-100mg  to [facility] at [phone]. Received fax confirmation.

## 2015-01-03 ENCOUNTER — Telehealth: Payer: Self-pay | Admitting: *Deleted

## 2015-01-03 NOTE — Telephone Encounter (Signed)
Spoke with patient's nurse, Lowella Bandy at [facility]. Advised we needed to reschedule pt appt due to Dr. Lucia Gaskins having family emergency. Offered appt same day with Darrol Angel, NP at 1:30pm. Check in 115pm. She checked with transportation and said this would be okay.

## 2015-01-04 ENCOUNTER — Ambulatory Visit: Payer: Medicare Other | Admitting: Neurology

## 2015-01-04 ENCOUNTER — Ambulatory Visit (INDEPENDENT_AMBULATORY_CARE_PROVIDER_SITE_OTHER): Payer: Medicare Other | Admitting: Nurse Practitioner

## 2015-01-04 ENCOUNTER — Encounter: Payer: Self-pay | Admitting: Nurse Practitioner

## 2015-01-04 VITALS — BP 120/76 | HR 85 | Ht 64.0 in | Wt 114.6 lb

## 2015-01-04 DIAGNOSIS — G219 Secondary parkinsonism, unspecified: Secondary | ICD-10-CM

## 2015-01-04 DIAGNOSIS — I200 Unstable angina: Secondary | ICD-10-CM

## 2015-01-04 NOTE — Progress Notes (Signed)
[facility]  PATIENT: [name] Luna DOB: [birth year]   REASON FOR VISIT: Follow-up for resting tremor , Parkinson's disease, gait abnormality HISTORY FROM: Patient and son    HISTORY OF PRESENT ILLNESS: [name] Luna, 81 year old female returns for follow-up with her son. She has a history of Parkinson's disease complicated by history of falls and hypotension. She is currently residing in a nursing facility. When last seen Dr. Lucia Gaskins 09/23/14 her blood pressure was to be checked for several weeks and then she was going to reinstitute full dose Sinemet 3 times a day. The order was faxed on 11/22/2014 however the patient is still getting one half tab of 25 100 three  times daily. Her blood pressure is in good control. Reading in the office today 120/76. Patient claims that she wants to be ambulatory again. She reports a good appetite and sleeps well at night when her roommate sleeps. She returns for reevaluation   09/23/14 Dr. Karle Plumber update: Tremor may be a little better sometimes with the sinement. Son is there every day for lunch and dinner. He still has to help feed her due to the tremor. She is dizzy on standing, she is having spells of vertigo, she had dehydration and was given fluids. She is weaker since then because she was in bed for a week due to dehydration. However since getting fluids her dizziness is improved. The room starts spinning especially when she stands or transfers. Having difficulty getting her thoughts together sometimes. Dizziness/vertigo when they use the lift to place her in bed or transfer her. Her Bp has been very low. Today in the office it is between 80/ and 100/. Discussed that her low BP is affecting her dizziness. Sinemet may be helping but hesitate to increase as this may also lower blood pressure. She is on BP medications and discussed possibly lowering those or increasing midodrine, will discuss with her cardiologist as well as her physician at  the nursing home.    Initial visit 06/24/2014: [name] Luna is a 81 y.o. female here as a referral from Dr. Tawanna Cooler for tremors  81 year old patient PMHx HTN, Afib, Syncope, orthostatic hypotension, gerd, UTI, pacemaker, macular degeneration, CAD, constipation, hypothyroid, HLD, Alzheimer's disease.   Here with son who provides most information. C/o leg weakness, tremors, falls. The weakness in the legs and tremors is the most concerning. Legs seem to be strong but seems to be a balance issue and patient is scared to walk due to falls and vision loss. She has fallen but not in the last year or two. Leg issue has been 3-4 years, progressing slowly. She shuffles. Son provides all information. Both hands have tremor. At times it is difficult to eat, she can't hold the fork due to the e. Son has to feed her because she can't see and also because she has a tremor. She can't see her food. Tremor is interferring with the basic ADLs. She has had physical therapy, occupational therapy. She used to walk at home with a walker, gait progressively got worse and was more difficult to keep her from falling. She would just lose her balance. She has orthostatic hypotension and sometimes gets dizzy when she stands up, that seems to have stabilized some as far as he can tell. Has constipation. Tremor worse with anxiety/stress.     REVIEW OF SYSTEMS: Full 14 system review of systems performed and notable only for those listed, all others are neg:  Constitutional: neg  Cardiovascular: neg  Ear/Nose/Throat: Hearing loss Skin: neg Eyes: Loss of vision, macular degeneration Respiratory: neg Gastroitestinal: Urinary incontinence  Hematology/Lymphatic: Easy bruising Endocrine: neg Musculoskeletal: Difficulty walking Allergy/Immunology: neg Neurological: Tremors Psychiatric: neg Sleep : neg   ALLERGIES: Allergies  Allergen Reactions  . Codeine Other (See Comments)    Syncope   . Imdur [Isosorbide] Other (See  Comments)    syncope  . Isosorbide Mononitrate Other (See Comments)    fainted  . Lorazepam Other (See Comments)    hallucinations  . Prednisone Nausea And Vomiting  . Penicillins Rash  . Valium [Diazepam] Other (See Comments)    Reaction unknown    HOME MEDICATIONS: Outpatient Prescriptions Prior to Visit  Medication Sig Dispense Refill  . acetaminophen (TYLENOL) 325 MG tablet Take 325 mg by mouth 3 (three) times daily.     [name] amiodarone (PACERONE) 200 MG tablet TAKE ONE TABLET BY MOUTH ONCE DAILY 30 tablet 11  . aspirin 81 MG EC tablet Take 81 mg by mouth daily.      . bisacodyl (DULCOLAX) 5 MG EC tablet Take 5 mg by mouth 2 (two) times daily.    . carbidopa-levodopa (SINEMET) 25-100 MG per tablet Take 1 tablet by mouth 3 (three) times daily. Take 4 hours apart. Separate from protein by one hour. 90 tablet 6  . divalproex (DEPAKOTE) 125 MG DR tablet Take 125 mg by mouth at bedtime.    [name] levothyroxine (SYNTHROID, LEVOTHROID) 100 MCG tablet TAKE ONE TABLET BY MOUTH ONCE DAILY BEFORE BREAKFAST 90 tablet 0  . loratadine (CLARITIN) 10 MG tablet Take 10 mg by mouth daily.      . meclizine (ANTIVERT) 25 MG tablet Take 25 mg by mouth 3 (three) times daily as needed for dizziness.    . metoprolol (LOPRESSOR) 25 MG tablet Take 2 tablets (50 mg total) by mouth 2 (two) times daily. 60 tablet 6  . midodrine (PROAMATINE) 2.5 MG tablet Take 1 tablet (2.5 mg total) by mouth daily before breakfast. 30 tablet 11  . nitroGLYCERIN (NITROSTAT) 0.4 MG SL tablet Place 0.4 mg under the tongue every 5 (five) minutes as needed for chest pain.     . Nutritional Supplements (NUTRITIONAL SHAKE PO) Take 8 oz by mouth 2 (two) times daily. 8 oz in am and 8oz in pm ([facility])    . omeprazole (PRILOSEC) 20 MG capsule Take 20 mg by mouth 2 (two) times daily.    . ondansetron (ZOFRAN) 4 MG tablet Take 4 mg by mouth 3 (three) times daily as needed for nausea or vomiting.    . promethazine (PHENERGAN) 12.5 MG tablet Take  12.5 mg by mouth 3 (three) times daily as needed for nausea or vomiting.    [name] QUEtiapine (SEROQUEL) 25 MG tablet Take 25 mg by mouth every morning. Q am and 1 tab QPM for psychosis    . ranolazine (RANEXA) 500 MG 12 hr tablet Take 1 tablet (500 mg total) by mouth daily. 30 tablet 11  . simvastatin (ZOCOR) 20 MG tablet Take 1 tablet (20 mg total) by mouth at bedtime. 30 tablet 0  . traMADol (ULTRAM) 50 MG tablet Take 0.5 tablets (25 mg total) by mouth at bedtime. (Patient taking differently: Take 25 mg by mouth 3 (three) times daily as needed. ) 30 tablet 0  . trimethoprim (TRIMPEX) 100 MG tablet Take 1 tablet (100 mg total) by mouth at bedtime. Do not take while on Keflex (cefazolin). (Patient taking differently: Take 100 mg by mouth daily. Do not take  while on Keflex (cefazolin).)     No facility-administered medications prior to visit.    PAST MEDICAL HISTORY: Past Medical History  Diagnosis Date  . CHF (congestive heart failure) (HCC)   . GERD (gastroesophageal reflux disease)   . Hypothyroidism   . Complete heart block (HCC)     a. Bradycardia/CHB s/p Medtronic pacemaker 2009.  [name] Pancreatitis   . Coronary artery disease     a. DES to LAD 2008, 2011, 2012 (no disease in LM or RCA, mild luminal irreg Cx). b. Normal nuc 08/2011: no ischemia, EF 80%.  . Shingles 1980's    "twice; once on my front side; once on my back" (02/07/2012)  . Pacemaker   . PAF (paroxysmal atrial fibrillation) (HCC)   . Tremor, essential   . S/P dilatation of esophageal stricture   . H/O hiatal hernia   . Neuromuscular disorder (HCC)     essential tremors  . UTI (lower urinary tract infection) 08/03/2011  . Dizziness, near syncope   . Macular degeneration, dry     "both eyes" (02/07/2012)  . Hypertension   . Pneumonia 1927  . Syncope   . Orthostatic hypotension   . Iliopsoas muscle hematoma     a. Spontaneously 05/2013 while on aspirin/Plavix. Not a candidate for anticoagulation other than aspirin due to  this.  . Hyperlipidemia   . Memory loss   . H/O valvular heart disease     a. 2D echo 2013: EF 55-60%, grade 2DD, mild MR, mild AI, mild-mod TR, PASP .    PAST SURGICAL HISTORY: Past Surgical History  Procedure Laterality Date  . Repair spigelian hernia    . S/p childbirth      x2  . Cataract extraction w/ intraocular lens  implant, bilateral  ~ 1997  . Vaginal hysterectomy  1970's  . Left oophorectomy  1970's  . Insert / replace / remove pacemaker  2000; 2009    initial placement; replaced  . Appendectomy  1933  . Cholecystectomy  1950's  . Hernia repair    . Esophageal dilation      "once" (02/07/2012)  . Cardiac catheterization  12/24/2005    No intervention - recommend medical therapy  . Cardiac catheterization  10/13/2006    80% proximal LAD stenosis, stented w/ a 3.5x26mm Cypher stent at 14atm (3.68mm), resulting in reduction of 80% to 0% residual.  . Cardiac catheterization  11/14/2006    No intervention - recommend medical therapy  . Cardiac catheterization  07/24/2007    No intervention.  . Cardiac catheterization  01/16/2010    75% proximal LAD stenosis w/ in-stent restenosis. No intervention. Dr Allyson Sabal will intervene on proximal LAD lesion.  . Cardiac catheterization  01/16/2010    75% proximal LAD stenosis, stented w/ a 3x21mm Promus DES at 16atm, resulting in reduction 75% to 0% residual  . Cardioversion  07/11/2010    Successful conversion to V paced verified via pacemaker.  . Cardiac catheterization  09/08/2010    LAD 75% in-stent restenosis, stented w/ a 3x49mm Resolute DES at 16atm (3.75mm), resulting in reduction of 75% stenosis  . Renal doppler  12/12/2010    Bilat proximal renal 1-59% diameter reduction. Bilat kidneys are normal.  . Cardiac catheterization  12/22/2010    No intervention - recommend medical therapy.  . Cardiovascular stress test  08/09/2011    No scintigraphic evidence of inducible myocardial ischemia. Non-diagnostic for ischemia.  .  Transthoracic echocardiogram  01/21/2012    EF 55-60%, mild-moderate tricuspid  regurg.  [name] Percutaneous coronary stent intervention (pci-s)  04/06/2014    Procedure: PERCUTANEOUS CORONARY STENT INTERVENTION (PCI-S);  Surgeon: Peter M Swaziland, MD;  Location: [facility] CATH LAB;  Service: Cardiovascular;;  . Cardiac catheterization  04/06/2014    Procedure: LEFT HEART CATH AND CORONARY ANGIOGRAPHY;  Surgeon: Peter M Swaziland, MD;  Location: [facility] CATH LAB;  Service: Cardiovascular;;    FAMILY HISTORY: Family History  Problem Relation Age of Onset  . Sudden death Mother     SOCIAL HISTORY: Social History   Social History  . Marital Status: Widowed    Spouse Name: N/A  . Number of Children: 2  . Years of Education: 12   Occupational History  . Not on file.   Social History Main Topics  . Smoking status: Never Smoker   . Smokeless tobacco: Never Used  . Alcohol Use: No  . Drug Use: No  . Sexual Activity: No   Other Topics Concern  . Not on file   Social History Narrative   Lives at [facility] nursing [facility].    Caffeine use: very little     PHYSICAL EXAM  Filed Vitals:   01/04/15 1306  BP: 120/76  Pulse: 85  Height: 5\' 4"  (1.626 m)  Weight: 114 lb 9.6 oz (51.982 kg)   Body mass index is 19.66 kg/(m^2).  Generalized: Well developed, in no acute distress  Head: normocephalic and atraumatic,. Oropharynx benign  Neck: Supple, no carotid bruits  Cardiac: Regular rate rhythm, no murmur  Musculoskeletal: No deformity   Neurological examination   Mentation: Alert , Recent and remote memory impaired  Follows all commands speech and language fluent.   Cranial nerve II-XII: Unable to visualize fundi Pupils were equal round reactive to light extraocular movements were full, visual field were full on confrontational test. Impaired upgaze Facial sensation and strength were normal. hearing was intact to finger rubbing bilaterally. Uvula tongue midline. head turning and shoulder shrug were normal  and symmetric.Tongue protrusion into cheek strength was normal. Motor: Right greater than left resting tremor with intention and postural components . Increased tone with cogwheeling at the wrists and elbows . Strength 4 out of 5 in the upper and lower extremities Sensory: Intact to light touch Coordination: No dysmetria on finger to nose  Reflexes: Depressed, plantar responses were flexor bilaterally. Gait and Station: Not ambulated in wheelchair   DIAGNOSTIC DATA (LABS, IMAGING, TESTING) - I reviewed patient records, labs, notes, testing and imaging myself where available.  Lab Results  Component Value Date   WBC 5.4 04/06/2014   HGB 11.8* 04/06/2014   HCT 37.2 04/06/2014   MCV 89.9 04/06/2014   PLT 168 04/06/2014      Component Value Date/Time   NA 142 10/12/2014 1125   K 4.4 10/12/2014 1125   CL 104 10/12/2014 1125   CO2 27 10/12/2014 1125   GLUCOSE 95 10/12/2014 1125   BUN 14 10/12/2014 1125   CREATININE 0.80 10/12/2014 1125   CREATININE 0.78 04/06/2014 0136   CALCIUM 8.8 10/12/2014 1125   PROT 5.9* 10/12/2014 1125   ALBUMIN 3.2* 10/12/2014 1125   AST 30 10/12/2014 1125   ALT 10 10/12/2014 1125   ALKPHOS 242* 10/12/2014 1125   BILITOT 0.4 10/12/2014 1125   GFRNONAA 71* 04/06/2014 0136   GFRAA 82* 04/06/2014 0136   Lab Results  Component Value Date   CHOL 144 04/06/2014   HDL 58 04/06/2014   LDLCALC 75 04/06/2014   TRIG 56 04/06/2014   CHOLHDL 2.5 04/06/2014  Lab Results  Component Value Date   TSH 1.255 10/12/2014      ASSESSMENT AND PLAN  81 y.o. year old female  has a past medical history of hypertension, atrial fibrillation, syncope hypotension macular degeneration Alzheimer's dementia resting tremor with Parkinson's features gait abnormality here to follow-up.The patient is a current patient of Dr. Lucia Gaskins  who is out of the office today . This note is sent to the work in doctor.     PLAN: Increase Sinemet 25 100 (1) tablet 3 times daily 30 minutes  before meals Take her blood pressure twice daily and watch for orthostasis first week of dose change Call the office if this happens Follow-up with Dr. Lucia Gaskins in 3 months Nilda Riggs, [facility], [facility], APRN  [facility] [address] [place], [place] [address] [phone]

## 2015-01-05 ENCOUNTER — Ambulatory Visit: Payer: Medicare Other | Admitting: Nurse Practitioner

## 2015-01-05 NOTE — Progress Notes (Signed)
I have reviewed and agreed above plan. 

## 2015-01-06 ENCOUNTER — Ambulatory Visit: Payer: Medicare Other | Admitting: Nurse Practitioner

## 2015-01-11 ENCOUNTER — Ambulatory Visit (INDEPENDENT_AMBULATORY_CARE_PROVIDER_SITE_OTHER): Payer: Medicare Other | Admitting: *Deleted

## 2015-01-11 ENCOUNTER — Telehealth: Payer: Self-pay | Admitting: Cardiology

## 2015-01-11 DIAGNOSIS — I4891 Unspecified atrial fibrillation: Secondary | ICD-10-CM | POA: Diagnosis not present

## 2015-01-11 NOTE — Telephone Encounter (Signed)
Confirmed remote transmission w/ pt nurse. Pt son agreed to upgraded monitor.

## 2015-01-12 ENCOUNTER — Encounter: Payer: Self-pay | Admitting: Cardiovascular Disease

## 2015-01-12 ENCOUNTER — Encounter: Payer: Self-pay | Admitting: Cardiology

## 2015-01-13 NOTE — Progress Notes (Signed)
Remote pacemaker transmission.   

## 2015-01-14 LAB — CUP PACEART REMOTE DEVICE CHECK
Battery Remaining Longevity: 19 mo
Battery Voltage: 2.74 V
Brady Statistic AP VP Percent: 75 %
Brady Statistic AP VS Percent: 0 %
Brady Statistic AS VS Percent: 16 %
Date Time Interrogation Session: 20161012165310
Implantable Lead Location: 753859
Implantable Lead Location: 753860
Implantable Lead Model: 5092
Lead Channel Impedance Value: 842 Ohm
Lead Channel Pacing Threshold Amplitude: 1.625 V
Lead Channel Setting Pacing Amplitude: 2 V
Lead Channel Setting Sensing Sensitivity: 2.8 mV
MDC IDC LEAD IMPLANT DT: 20001214
MDC IDC LEAD IMPLANT DT: 20001214
MDC IDC MSMT BATTERY IMPEDANCE: 2762 Ohm
MDC IDC MSMT LEADCHNL RA IMPEDANCE VALUE: 548 Ohm
MDC IDC MSMT LEADCHNL RA SENSING INTR AMPL: 2 mV
MDC IDC MSMT LEADCHNL RV PACING THRESHOLD PULSEWIDTH: 0.4 ms
MDC IDC SET LEADCHNL RV PACING AMPLITUDE: 3.25 V
MDC IDC SET LEADCHNL RV PACING PULSEWIDTH: 0.4 ms
MDC IDC STAT BRADY AS VP PERCENT: 10 %

## 2015-01-20 ENCOUNTER — Telehealth: Payer: Self-pay | Admitting: Cardiovascular Disease

## 2015-01-20 ENCOUNTER — Encounter: Payer: Self-pay | Admitting: Cardiology

## 2015-01-20 NOTE — Telephone Encounter (Signed)
Informed pt son that pt transmission was received on 01-12-15

## 2015-01-20 NOTE — Telephone Encounter (Signed)
New Message  Pt husband is calling to speak to someone in device to confirm  If transmit was successful

## 2015-04-12 ENCOUNTER — Encounter: Payer: Medicare Other | Admitting: *Deleted

## 2015-04-12 ENCOUNTER — Telehealth: Payer: Self-pay | Admitting: Cardiovascular Disease

## 2015-04-12 ENCOUNTER — Telehealth: Payer: Self-pay | Admitting: Cardiology

## 2015-04-12 NOTE — Telephone Encounter (Signed)
New Message  RN from [facility] calling to speak w/ Device to see if remote transmission was sent successfully. Please call back and discuss.

## 2015-04-12 NOTE — Telephone Encounter (Signed)
Spoke w/ pt nurse and informed her that we did not receive pt remote transmission. She informed that they do not get cell signal at the facility. Ordered a wireT b/c facility does have Internet access. Pt nurse will send once they receive this piece.

## 2015-04-12 NOTE — Telephone Encounter (Signed)
Confirmed remote transmission w/ pt nurse.   

## 2015-04-13 ENCOUNTER — Encounter: Payer: Self-pay | Admitting: Cardiology

## 2015-04-18 ENCOUNTER — Ambulatory Visit: Payer: Medicare Other | Admitting: Neurology

## 2015-04-21 ENCOUNTER — Telehealth: Payer: Self-pay | Admitting: Cardiovascular Disease

## 2015-04-21 NOTE — Telephone Encounter (Signed)
Spoke w/ pt son and informed him that I spoke w/ [employer] and they informed that pt wiret was delivered on 04-18-15 at 3:55 PM at the nursing home address. Pt son verbalized understanding and said he would call back to have [place] order another on if the facility can not find that one that was recently delivered.

## 2015-04-21 NOTE — Telephone Encounter (Signed)
New message     Son calling wants to talk with someone in device clinic regarding letter his mother receiving - transmitting

## 2015-04-27 ENCOUNTER — Ambulatory Visit (INDEPENDENT_AMBULATORY_CARE_PROVIDER_SITE_OTHER): Payer: Medicare Other | Admitting: *Deleted

## 2015-04-27 DIAGNOSIS — I4891 Unspecified atrial fibrillation: Secondary | ICD-10-CM | POA: Diagnosis not present

## 2015-05-03 NOTE — Progress Notes (Signed)
Remote pacemaker transmission.   

## 2015-05-07 ENCOUNTER — Encounter: Payer: Self-pay | Admitting: Cardiovascular Disease

## 2015-05-11 LAB — CUP PACEART REMOTE DEVICE CHECK
Battery Remaining Longevity: 22 mo
Brady Statistic AP VP Percent: 62 %
Brady Statistic AS VP Percent: 18 %
Brady Statistic AS VS Percent: 20 %
Date Time Interrogation Session: 20170125160657
Implantable Lead Implant Date: 20001214
Implantable Lead Location: 753860
Lead Channel Pacing Threshold Amplitude: 1.375 V
Lead Channel Pacing Threshold Pulse Width: 0.4 ms
Lead Channel Sensing Intrinsic Amplitude: 1.4 mV
Lead Channel Setting Pacing Amplitude: 2.75 V
Lead Channel Setting Pacing Pulse Width: 0.4 ms
MDC IDC LEAD IMPLANT DT: 20001214
MDC IDC LEAD LOCATION: 753859
MDC IDC MSMT BATTERY IMPEDANCE: 2599 Ohm
MDC IDC MSMT BATTERY VOLTAGE: 2.73 V
MDC IDC MSMT LEADCHNL RA IMPEDANCE VALUE: 543 Ohm
MDC IDC MSMT LEADCHNL RV IMPEDANCE VALUE: 887 Ohm
MDC IDC SET LEADCHNL RA PACING AMPLITUDE: 2 V
MDC IDC SET LEADCHNL RV SENSING SENSITIVITY: 2.8 mV
MDC IDC STAT BRADY AP VS PERCENT: 0 %

## 2015-05-13 ENCOUNTER — Encounter: Payer: Self-pay | Admitting: Cardiology

## 2015-07-27 ENCOUNTER — Ambulatory Visit (INDEPENDENT_AMBULATORY_CARE_PROVIDER_SITE_OTHER): Payer: Medicare Other | Admitting: *Deleted

## 2015-07-27 DIAGNOSIS — I4891 Unspecified atrial fibrillation: Secondary | ICD-10-CM | POA: Diagnosis not present

## 2015-07-27 NOTE — Progress Notes (Signed)
Remote pacemaker transmission.   

## 2015-08-10 ENCOUNTER — Encounter: Payer: Self-pay | Admitting: Cardiovascular Disease

## 2015-08-11 ENCOUNTER — Telehealth: Payer: Self-pay | Admitting: Cardiology

## 2015-08-11 NOTE — Telephone Encounter (Signed)
Spoke w/ pt nurse and informed her that the remote transmission was received. Informed her that the transmission has not been interrogated but as soon as it is the MD will sign off on it and we will have results at that time. Pt nurse verbalized understanding.

## 2015-08-22 ENCOUNTER — Encounter: Payer: Self-pay | Admitting: Cardiovascular Disease

## 2015-08-23 LAB — CUP PACEART REMOTE DEVICE CHECK
Battery Impedance: 2690 Ohm
Battery Voltage: 2.74 V
Brady Statistic AP VS Percent: 0 %
Implantable Lead Implant Date: 20001214
Implantable Lead Implant Date: 20001214
Implantable Lead Location: 753860
Implantable Lead Model: 4592
Lead Channel Impedance Value: 1001 Ohm
Lead Channel Pacing Threshold Pulse Width: 0.4 ms
Lead Channel Sensing Intrinsic Amplitude: 2 mV
Lead Channel Setting Pacing Amplitude: 2 V
Lead Channel Setting Sensing Sensitivity: 2.8 mV
MDC IDC LEAD LOCATION: 753859
MDC IDC MSMT BATTERY REMAINING LONGEVITY: 22 mo
MDC IDC MSMT LEADCHNL RA IMPEDANCE VALUE: 560 Ohm
MDC IDC MSMT LEADCHNL RV PACING THRESHOLD AMPLITUDE: 1.375 V
MDC IDC SESS DTM: 20170426131400
MDC IDC SET LEADCHNL RV PACING AMPLITUDE: 2.75 V
MDC IDC SET LEADCHNL RV PACING PULSEWIDTH: 0.4 ms
MDC IDC STAT BRADY AP VP PERCENT: 53 %
MDC IDC STAT BRADY AS VP PERCENT: 33 %
MDC IDC STAT BRADY AS VS PERCENT: 14 %

## 2015-08-23 NOTE — Telephone Encounter (Signed)
Spoke to son Peyton Najjar(Larry) about patient's pacemaker transmission. I informed him that the transmission was reviewed and that the device is functioning normally. Son voiced understanding.

## 2015-08-24 ENCOUNTER — Encounter: Payer: Self-pay | Admitting: Cardiology

## 2015-10-14 ENCOUNTER — Telehealth: Payer: Self-pay | Admitting: Cardiovascular Disease

## 2015-10-14 NOTE — Telephone Encounter (Signed)
Informed pts son that Dr. Royann Shiversroitoru is ok with q3 month remote monitoring and the next one is scheduled for 10/27/2015. pts son voiced understanding.

## 2015-10-14 NOTE — Telephone Encounter (Signed)
Returned call to patient's son.He stated it is becoming more difficult to bring mother to office.He had to cancel 8/8 appointment with Dr.Croitoru.He was wanting to know if her device can be checked remotely.Advised I will send message to Dr.Croitoru and device team.

## 2015-10-14 NOTE — Telephone Encounter (Signed)
New message       The son is calling cause the pt is in a nursing home and it is hard to get the pt over to see a MD the son was wondering if the office could just do remote checks instead of coming in to see the MD.

## 2015-10-14 NOTE — Telephone Encounter (Signed)
Yes, we can do Q3 month remote checks

## 2015-10-14 NOTE — Telephone Encounter (Signed)
Pts device has remote capabilities, will await Dr. Erin Hearingroitoru's ok to resume remote monitoring q3 months with no office visits.

## 2015-10-27 ENCOUNTER — Telehealth: Payer: Self-pay | Admitting: Cardiology

## 2015-10-27 ENCOUNTER — Ambulatory Visit (INDEPENDENT_AMBULATORY_CARE_PROVIDER_SITE_OTHER): Payer: Medicare Other | Admitting: *Deleted

## 2015-10-27 DIAGNOSIS — I4891 Unspecified atrial fibrillation: Secondary | ICD-10-CM

## 2015-10-27 NOTE — Telephone Encounter (Signed)
Confirmed remote transmission w/ pt nurse.   

## 2015-10-28 ENCOUNTER — Encounter: Payer: Self-pay | Admitting: Cardiology

## 2015-10-28 LAB — CUP PACEART REMOTE DEVICE CHECK
Battery Remaining Longevity: 19 mo
Brady Statistic AP VS Percent: 0 %
Brady Statistic AS VS Percent: 10 %
Implantable Lead Implant Date: 20001214
Implantable Lead Location: 753859
Implantable Lead Location: 753860
Implantable Lead Model: 5092
Lead Channel Impedance Value: 1019 Ohm
Lead Channel Impedance Value: 550 Ohm
Lead Channel Pacing Threshold Amplitude: 1.25 V
MDC IDC LEAD IMPLANT DT: 20001214
MDC IDC MSMT BATTERY IMPEDANCE: 3030 Ohm
MDC IDC MSMT BATTERY VOLTAGE: 2.73 V
MDC IDC MSMT LEADCHNL RV PACING THRESHOLD PULSEWIDTH: 0.4 ms
MDC IDC SESS DTM: 20170727170818
MDC IDC SET LEADCHNL RA PACING AMPLITUDE: 2 V
MDC IDC SET LEADCHNL RV PACING AMPLITUDE: 2.5 V
MDC IDC SET LEADCHNL RV PACING PULSEWIDTH: 0.4 ms
MDC IDC SET LEADCHNL RV SENSING SENSITIVITY: 2.8 mV
MDC IDC STAT BRADY AP VP PERCENT: 50 %
MDC IDC STAT BRADY AS VP PERCENT: 39 %

## 2015-10-28 NOTE — Progress Notes (Signed)
Remote pacemaker transmission.   

## 2015-11-01 ENCOUNTER — Encounter: Payer: Medicare Other | Admitting: Cardiovascular Disease

## 2015-11-08 ENCOUNTER — Encounter: Payer: Medicare Other | Admitting: Cardiovascular Disease

## 2016-01-26 ENCOUNTER — Ambulatory Visit (INDEPENDENT_AMBULATORY_CARE_PROVIDER_SITE_OTHER): Payer: Medicare Other | Admitting: *Deleted

## 2016-01-26 ENCOUNTER — Telehealth: Payer: Self-pay | Admitting: Cardiology

## 2016-01-26 DIAGNOSIS — I480 Paroxysmal atrial fibrillation: Secondary | ICD-10-CM

## 2016-01-26 NOTE — Telephone Encounter (Signed)
Confirmed remote transmission w/ pt nurse.   

## 2016-01-26 NOTE — Progress Notes (Signed)
Remote pacemaker transmission.   

## 2016-02-03 ENCOUNTER — Encounter: Payer: Self-pay | Admitting: Cardiology

## 2016-02-23 LAB — CUP PACEART REMOTE DEVICE CHECK
Battery Voltage: 2.73 V
Brady Statistic AP VP Percent: 45 %
Brady Statistic AP VS Percent: 0 %
Brady Statistic AS VP Percent: 42 %
Implantable Lead Implant Date: 20001214
Implantable Lead Location: 753859
Implantable Lead Model: 4592
Implantable Lead Model: 5092
Implantable Pulse Generator Implant Date: 20091028
Lead Channel Impedance Value: 616 Ohm
Lead Channel Pacing Threshold Amplitude: 1 V
Lead Channel Pacing Threshold Pulse Width: 0.4 ms
Lead Channel Sensing Intrinsic Amplitude: 1 mV
Lead Channel Setting Pacing Amplitude: 2 V
Lead Channel Setting Pacing Amplitude: 2.5 V
MDC IDC LEAD IMPLANT DT: 20001214
MDC IDC LEAD LOCATION: 753860
MDC IDC MSMT BATTERY IMPEDANCE: 3481 Ohm
MDC IDC MSMT BATTERY REMAINING LONGEVITY: 16 mo
MDC IDC MSMT LEADCHNL RV IMPEDANCE VALUE: 915 Ohm
MDC IDC SESS DTM: 20171026161346
MDC IDC SET LEADCHNL RV PACING PULSEWIDTH: 0.4 ms
MDC IDC SET LEADCHNL RV SENSING SENSITIVITY: 2.8 mV
MDC IDC STAT BRADY AS VS PERCENT: 12 %

## 2016-02-27 IMAGING — DX DG CHEST 2V
2 series · 2 of 2 positions shown · non-contrast
Comparison: 10/20/2013

CLINICAL DATA: Chest pain and shortness of breath today.

EXAM:
CHEST  2 VIEW

[chest lat]
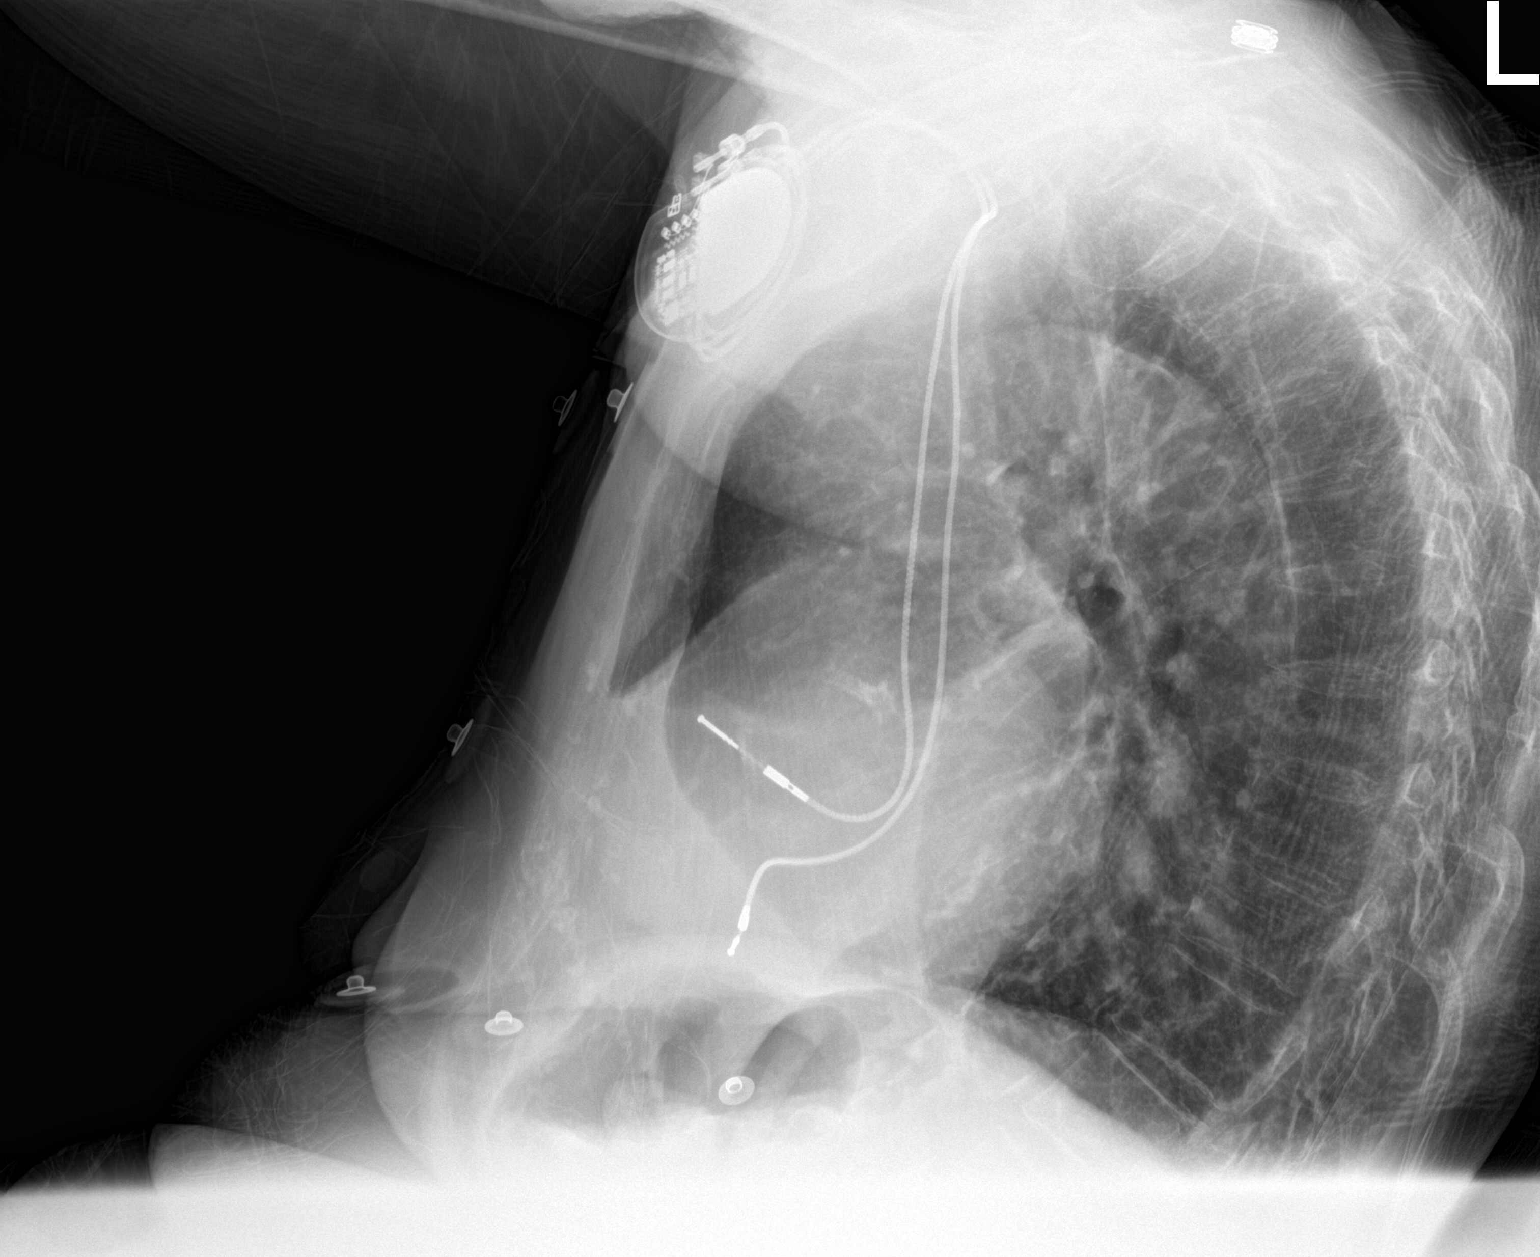

[chest ap]
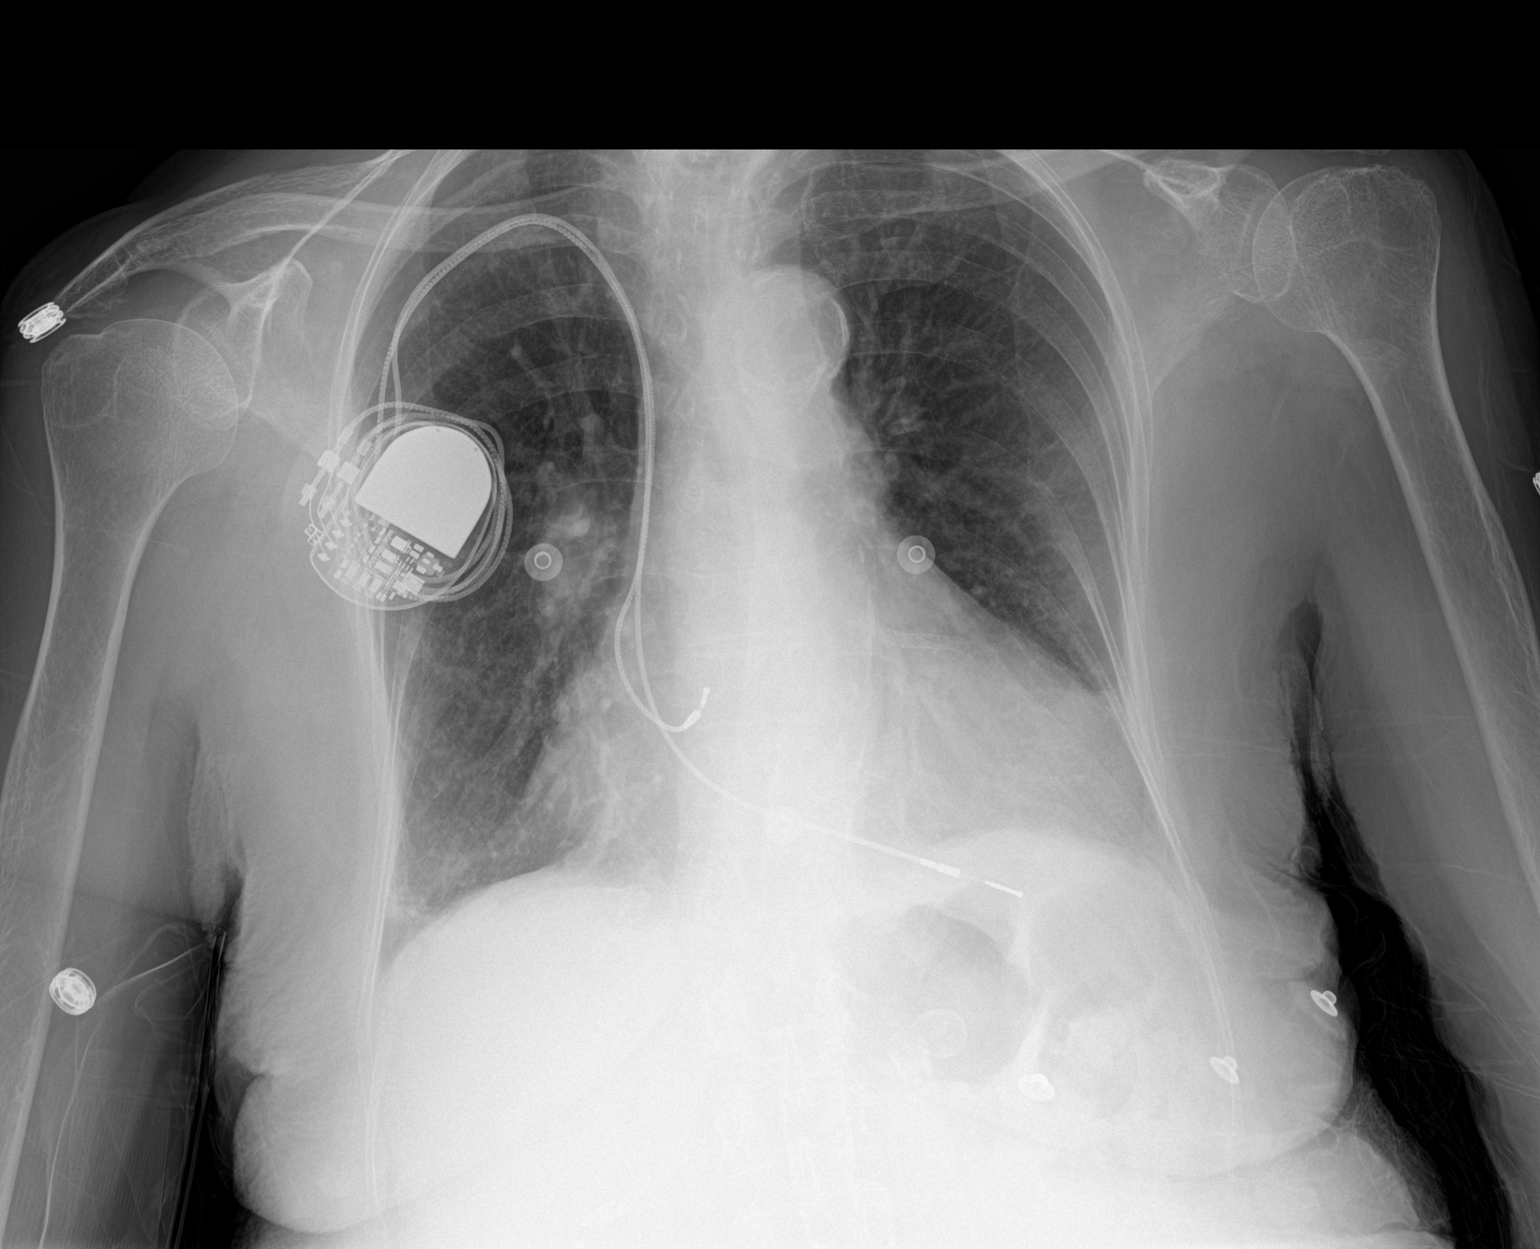

[2 of 2 positions shown; findings below may reference images not displayed]

FINDINGS: Right AICD leads overlie the right atrium and right ventricle. The
heart is enlarged. There is minimal right basilar atelectasis. No
focal consolidations or pleural effusions. No pulmonary edema. Left
upper quadrant calcification likely represents a splenic artery
aneurysm and appears stable over multiple prior studies measuring
less than 2 cm in diameter.
IMPRESSION: 1. Cardiomegaly.
2.  No evidence for acute cardiopulmonary abnormality.

## 2016-02-28 ENCOUNTER — Telehealth: Payer: Self-pay | Admitting: Cardiovascular Disease

## 2016-02-28 ENCOUNTER — Telehealth: Payer: Self-pay

## 2016-02-28 NOTE — Telephone Encounter (Signed)
Thank you for letting me know. Will also notify the device clinic MCr

## 2016-02-28 NOTE — Telephone Encounter (Signed)
-----   Message from Thurmon FairMihai Croitoru, MD sent at 02/23/2016 10:30 AM EST ----- Remote reviewed.   Not pacemaker dependent. Battery status is good. Lead measurements are stable. Heart rate histogram is favorable. Recent increase in burden of persistent atrial fibrillation (including an ongoing episode). Rate control is suboptimal. Please increase amiodarone to 300 mg daily (1.5 tabs daily).

## 2016-02-28 NOTE — Telephone Encounter (Signed)
New message  [facility]  Please call back

## 2016-02-28 NOTE — Telephone Encounter (Signed)
lmtcb

## 2016-02-28 NOTE — Telephone Encounter (Signed)
I called son back. Home number listed is for [facility], son is on a [facility] DPR as only means of contact, also listed POA. Son informs me that the patient passed away on 07-04-2015. I expressed our condolences for his loss. He is aware I will communicate this news w Dr. Royann Shiversroitoru. I informed him if there is any way we can be of help to feel free to reach out to us. Caller voiced thanks.

## 2016-03-02 DEATH — deceased

## 2016-03-22 NOTE — Telephone Encounter (Signed)
Patient is deceased.

## 2016-12-21 ENCOUNTER — Encounter: Payer: Self-pay | Admitting: Family Medicine
